# Patient Record
Sex: Male | Born: 1945 | Race: White | Hispanic: No | Marital: Married | Smoking: Former smoker
Health system: Southern US, Community
[De-identification: ages and names within clinical notes are randomized; demographics above are authoritative.]

## PROBLEM LIST (undated history)

## (undated) DIAGNOSIS — I4729 Other ventricular tachycardia: Secondary | ICD-10-CM

## (undated) DIAGNOSIS — M1710 Unilateral primary osteoarthritis, unspecified knee: Secondary | ICD-10-CM

## (undated) DIAGNOSIS — L509 Urticaria, unspecified: Secondary | ICD-10-CM

## (undated) DIAGNOSIS — E785 Hyperlipidemia, unspecified: Secondary | ICD-10-CM

## (undated) DIAGNOSIS — I428 Other cardiomyopathies: Secondary | ICD-10-CM

## (undated) DIAGNOSIS — IMO0002 Reserved for concepts with insufficient information to code with codable children: Secondary | ICD-10-CM

## (undated) DIAGNOSIS — T8859XA Other complications of anesthesia, initial encounter: Secondary | ICD-10-CM

## (undated) DIAGNOSIS — Z860100 Personal history of colon polyps, unspecified: Secondary | ICD-10-CM

## (undated) DIAGNOSIS — I509 Heart failure, unspecified: Secondary | ICD-10-CM

## (undated) DIAGNOSIS — I429 Cardiomyopathy, unspecified: Secondary | ICD-10-CM

## (undated) DIAGNOSIS — Z8249 Family history of ischemic heart disease and other diseases of the circulatory system: Secondary | ICD-10-CM

## (undated) DIAGNOSIS — M1990 Unspecified osteoarthritis, unspecified site: Secondary | ICD-10-CM

## (undated) DIAGNOSIS — L500 Allergic urticaria: Secondary | ICD-10-CM

## (undated) DIAGNOSIS — Z95810 Presence of automatic (implantable) cardiac defibrillator: Secondary | ICD-10-CM

## (undated) DIAGNOSIS — I472 Ventricular tachycardia: Secondary | ICD-10-CM

## (undated) DIAGNOSIS — F419 Anxiety disorder, unspecified: Secondary | ICD-10-CM

## (undated) DIAGNOSIS — J45909 Unspecified asthma, uncomplicated: Secondary | ICD-10-CM

## (undated) DIAGNOSIS — I10 Essential (primary) hypertension: Secondary | ICD-10-CM

## (undated) DIAGNOSIS — K219 Gastro-esophageal reflux disease without esophagitis: Secondary | ICD-10-CM

## (undated) DIAGNOSIS — I493 Ventricular premature depolarization: Secondary | ICD-10-CM

## (undated) DIAGNOSIS — R0902 Hypoxemia: Secondary | ICD-10-CM

## (undated) DIAGNOSIS — T50905A Adverse effect of unspecified drugs, medicaments and biological substances, initial encounter: Secondary | ICD-10-CM

## (undated) DIAGNOSIS — R9389 Abnormal findings on diagnostic imaging of other specified body structures: Secondary | ICD-10-CM

## (undated) DIAGNOSIS — N189 Chronic kidney disease, unspecified: Secondary | ICD-10-CM

## (undated) DIAGNOSIS — T782XXA Anaphylactic shock, unspecified, initial encounter: Secondary | ICD-10-CM

## (undated) DIAGNOSIS — L409 Psoriasis, unspecified: Secondary | ICD-10-CM

## (undated) HISTORY — DX: Anxiety disorder, unspecified: F41.9

## (undated) HISTORY — PX: TONSILLECTOMY: SUR1361

## (undated) HISTORY — PX: OTHER SURGICAL HISTORY: SHX169

## (undated) HISTORY — PX: CATARACT EXTRACTION: SUR2

## (undated) HISTORY — PX: EYE SURGERY: SHX253

## (undated) HISTORY — PX: APPENDECTOMY: SHX54

## (undated) HISTORY — PX: CARDIAC CATHETERIZATION: SHX172

## (undated) HISTORY — DX: Essential (primary) hypertension: I10

## (undated) HISTORY — DX: Other ventricular tachycardia: I47.29

## (undated) HISTORY — DX: Psoriasis, unspecified: L40.9

## (undated) HISTORY — DX: Unilateral primary osteoarthritis, unspecified knee: M17.10

## (undated) HISTORY — DX: Urticaria, unspecified: L50.9

## (undated) HISTORY — DX: Unspecified osteoarthritis, unspecified site: M19.90

## (undated) HISTORY — DX: Heart failure, unspecified: I50.9

## (undated) HISTORY — DX: Presence of automatic (implantable) cardiac defibrillator: Z95.810

## (undated) HISTORY — DX: Reserved for concepts with insufficient information to code with codable children: IMO0002

## (undated) HISTORY — DX: Ventricular tachycardia: I47.2

## (undated) HISTORY — DX: Hyperlipidemia, unspecified: E78.5

## (undated) HISTORY — DX: Unspecified asthma, uncomplicated: J45.909

## (undated) HISTORY — DX: Chronic kidney disease, unspecified: N18.9

## (undated) HISTORY — DX: Abnormal findings on diagnostic imaging of other specified body structures: R93.89

## (undated) HISTORY — DX: Ventricular premature depolarization: I49.3

## (undated) HISTORY — PX: ESOPHAGOGASTRODUODENOSCOPY: SHX1529

## (undated) HISTORY — DX: Hypoxemia: R09.02

---

## 2004-08-18 HISTORY — PX: KNEE ARTHROPLASTY: SHX992

## 2005-05-22 ENCOUNTER — Other Ambulatory Visit: Payer: Self-pay

## 2005-05-23 ENCOUNTER — Ambulatory Visit: Payer: Self-pay | Admitting: Unknown Physician Specialty

## 2005-08-28 ENCOUNTER — Ambulatory Visit: Payer: Self-pay | Admitting: Cardiology

## 2005-09-04 ENCOUNTER — Encounter: Payer: Self-pay | Admitting: Cardiology

## 2005-09-04 ENCOUNTER — Ambulatory Visit: Payer: Self-pay | Admitting: Cardiology

## 2005-09-04 ENCOUNTER — Ambulatory Visit (HOSPITAL_COMMUNITY): Admission: RE | Admit: 2005-09-04 | Discharge: 2005-09-04 | Payer: Self-pay | Admitting: Cardiology

## 2005-09-08 ENCOUNTER — Ambulatory Visit: Payer: Self-pay | Admitting: Cardiology

## 2005-09-09 ENCOUNTER — Ambulatory Visit: Payer: Self-pay | Admitting: Internal Medicine

## 2005-09-17 ENCOUNTER — Ambulatory Visit: Payer: Self-pay | Admitting: Internal Medicine

## 2005-09-17 ENCOUNTER — Ambulatory Visit (HOSPITAL_COMMUNITY): Admission: RE | Admit: 2005-09-17 | Discharge: 2005-09-18 | Payer: Self-pay | Admitting: Internal Medicine

## 2005-09-26 ENCOUNTER — Ambulatory Visit: Payer: Self-pay | Admitting: Internal Medicine

## 2005-09-26 ENCOUNTER — Ambulatory Visit: Payer: Self-pay | Admitting: Cardiology

## 2005-10-03 ENCOUNTER — Ambulatory Visit: Payer: Self-pay | Admitting: Cardiology

## 2005-10-21 ENCOUNTER — Ambulatory Visit: Payer: Self-pay | Admitting: Critical Care Medicine

## 2005-10-31 ENCOUNTER — Ambulatory Visit: Payer: Self-pay | Admitting: Internal Medicine

## 2005-11-18 ENCOUNTER — Ambulatory Visit: Payer: Self-pay | Admitting: Internal Medicine

## 2005-11-26 ENCOUNTER — Ambulatory Visit: Payer: Self-pay | Admitting: Internal Medicine

## 2005-12-16 ENCOUNTER — Ambulatory Visit: Payer: Self-pay | Admitting: Cardiology

## 2005-12-30 ENCOUNTER — Ambulatory Visit: Payer: Self-pay | Admitting: Cardiology

## 2006-01-05 ENCOUNTER — Ambulatory Visit: Payer: Self-pay | Admitting: Cardiology

## 2006-01-16 ENCOUNTER — Ambulatory Visit: Payer: Self-pay | Admitting: Internal Medicine

## 2006-03-05 ENCOUNTER — Ambulatory Visit: Payer: Self-pay | Admitting: Cardiology

## 2006-04-01 ENCOUNTER — Ambulatory Visit: Payer: Self-pay | Admitting: Cardiology

## 2006-04-01 ENCOUNTER — Ambulatory Visit: Payer: Self-pay

## 2006-04-01 ENCOUNTER — Encounter: Payer: Self-pay | Admitting: Cardiology

## 2006-04-08 ENCOUNTER — Ambulatory Visit: Payer: Self-pay | Admitting: Cardiology

## 2006-04-16 ENCOUNTER — Ambulatory Visit: Payer: Self-pay | Admitting: Internal Medicine

## 2006-06-11 ENCOUNTER — Ambulatory Visit: Payer: Self-pay | Admitting: Internal Medicine

## 2006-07-16 ENCOUNTER — Ambulatory Visit: Payer: Self-pay | Admitting: Internal Medicine

## 2006-07-24 ENCOUNTER — Ambulatory Visit: Payer: Self-pay | Admitting: Cardiology

## 2006-10-01 ENCOUNTER — Ambulatory Visit: Payer: Self-pay | Admitting: Cardiology

## 2006-10-01 LAB — CONVERTED CEMR LAB
AST: 20 units/L (ref 0–37)
Albumin: 3.6 g/dL (ref 3.5–5.2)
Bilirubin, Direct: 0.1 mg/dL (ref 0.0–0.3)
CO2: 27 meq/L (ref 19–32)
Chloride: 105 meq/L (ref 96–112)
Cholesterol: 183 mg/dL (ref 0–200)
Creatinine, Ser: 1.4 mg/dL (ref 0.4–1.5)
Direct LDL: 107.4 mg/dL
Glucose, Bld: 100 mg/dL — ABNORMAL HIGH (ref 70–99)
HDL: 43.4 mg/dL (ref >39.0)
Sodium: 139 meq/L (ref 135–145)
Total Bilirubin: 0.9 mg/dL (ref 0.3–1.2)
Total Protein: 6.9 g/dL (ref 6.0–8.3)

## 2006-10-27 ENCOUNTER — Ambulatory Visit: Payer: Self-pay | Admitting: Cardiology

## 2006-12-28 ENCOUNTER — Ambulatory Visit: Payer: Self-pay | Admitting: Cardiology

## 2007-02-17 ENCOUNTER — Ambulatory Visit: Payer: Self-pay | Admitting: Cardiology

## 2007-02-17 LAB — CONVERTED CEMR LAB
ALT: 20 units/L (ref 0–53)
AST: 14 units/L (ref 0–37)
Alkaline Phosphatase: 57 units/L (ref 39–117)
BUN: 22 mg/dL (ref 6–23)
CO2: 28 meq/L (ref 19–32)
Calcium: 8.8 mg/dL (ref 8.4–10.5)
Chloride: 107 meq/L (ref 96–112)
GFR calc Af Amer: 62 mL/min
Total Protein: 7 g/dL (ref 6.0–8.3)
Triglycerides: 271 mg/dL (ref 0–149)

## 2007-06-18 ENCOUNTER — Ambulatory Visit: Payer: Self-pay | Admitting: Cardiology

## 2007-06-22 ENCOUNTER — Ambulatory Visit: Payer: Self-pay

## 2007-07-22 ENCOUNTER — Ambulatory Visit: Payer: Self-pay | Admitting: Cardiology

## 2007-07-22 LAB — CONVERTED CEMR LAB
Bilirubin, Direct: 0.1 mg/dL (ref 0.0–0.3)
Calcium: 9.2 mg/dL (ref 8.4–10.5)
Cholesterol: 199 mg/dL (ref 0–200)
GFR calc Af Amer: 80 mL/min
GFR calc non Af Amer: 66 mL/min
Glucose, Bld: 105 mg/dL — ABNORMAL HIGH (ref 70–99)
HDL: 53.6 mg/dL (ref >39.0)
LDL Cholesterol: 116 mg/dL — ABNORMAL HIGH (ref 0–99)
Sodium: 140 meq/L (ref 135–145)
TSH: 1.5 microintl units/mL (ref 0.35–5.50)
Total CHOL/HDL Ratio: 3.7

## 2007-07-27 ENCOUNTER — Ambulatory Visit: Payer: Self-pay | Admitting: Internal Medicine

## 2007-11-08 ENCOUNTER — Ambulatory Visit: Payer: Self-pay | Admitting: Cardiology

## 2007-11-16 ENCOUNTER — Encounter: Payer: Self-pay | Admitting: Cardiology

## 2007-11-16 ENCOUNTER — Ambulatory Visit: Payer: Self-pay | Admitting: Cardiology

## 2007-11-16 LAB — CONVERTED CEMR LAB
Chloride: 105 meq/L (ref 96–112)
Potassium: 4.4 meq/L (ref 3.5–5.3)

## 2007-12-10 ENCOUNTER — Ambulatory Visit: Payer: Self-pay | Admitting: Cardiology

## 2007-12-20 ENCOUNTER — Ambulatory Visit: Payer: Self-pay | Admitting: Internal Medicine

## 2008-02-03 ENCOUNTER — Ambulatory Visit: Payer: Self-pay | Admitting: Cardiology

## 2008-02-09 ENCOUNTER — Ambulatory Visit: Payer: Self-pay | Admitting: Cardiology

## 2008-02-09 LAB — CONVERTED CEMR LAB: TSH: 1.68 microintl units/mL (ref 0.35–5.50)

## 2008-04-11 ENCOUNTER — Ambulatory Visit: Payer: Self-pay | Admitting: Internal Medicine

## 2008-07-10 ENCOUNTER — Ambulatory Visit: Payer: Self-pay | Admitting: Internal Medicine

## 2008-07-27 ENCOUNTER — Ambulatory Visit: Payer: Self-pay | Admitting: Cardiology

## 2008-07-27 LAB — CONVERTED CEMR LAB
AST: 16 units/L (ref 0–37)
Alkaline Phosphatase: 51 units/L (ref 39–117)
TSH: 1.12 microintl units/mL (ref 0.35–5.50)
Total Bilirubin: 0.8 mg/dL (ref 0.3–1.2)

## 2008-08-03 ENCOUNTER — Ambulatory Visit: Payer: Self-pay | Admitting: Cardiology

## 2008-10-09 ENCOUNTER — Ambulatory Visit: Payer: Self-pay | Admitting: Internal Medicine

## 2008-10-09 ENCOUNTER — Emergency Department (HOSPITAL_COMMUNITY): Admission: EM | Admit: 2008-10-09 | Discharge: 2008-10-09 | Payer: Self-pay | Admitting: Emergency Medicine

## 2008-10-11 ENCOUNTER — Encounter: Payer: Self-pay | Admitting: Internal Medicine

## 2008-10-27 DIAGNOSIS — E782 Mixed hyperlipidemia: Secondary | ICD-10-CM | POA: Insufficient documentation

## 2008-10-27 DIAGNOSIS — I493 Ventricular premature depolarization: Secondary | ICD-10-CM | POA: Insufficient documentation

## 2008-10-27 DIAGNOSIS — M818 Other osteoporosis without current pathological fracture: Secondary | ICD-10-CM | POA: Insufficient documentation

## 2008-10-27 DIAGNOSIS — I00 Rheumatic fever without heart involvement: Secondary | ICD-10-CM | POA: Insufficient documentation

## 2008-10-30 ENCOUNTER — Ambulatory Visit: Payer: Self-pay | Admitting: Cardiology

## 2008-10-30 ENCOUNTER — Encounter: Payer: Self-pay | Admitting: Cardiology

## 2008-11-07 ENCOUNTER — Encounter: Payer: Self-pay | Admitting: Cardiology

## 2008-11-07 ENCOUNTER — Ambulatory Visit: Payer: Self-pay

## 2008-12-18 ENCOUNTER — Ambulatory Visit: Payer: Self-pay | Admitting: Internal Medicine

## 2008-12-20 ENCOUNTER — Encounter: Payer: Self-pay | Admitting: Internal Medicine

## 2009-02-15 ENCOUNTER — Ambulatory Visit: Payer: Self-pay | Admitting: Cardiology

## 2009-02-15 DIAGNOSIS — J984 Other disorders of lung: Secondary | ICD-10-CM | POA: Insufficient documentation

## 2009-02-15 DIAGNOSIS — I10 Essential (primary) hypertension: Secondary | ICD-10-CM | POA: Insufficient documentation

## 2009-02-15 DIAGNOSIS — I428 Other cardiomyopathies: Secondary | ICD-10-CM | POA: Insufficient documentation

## 2009-02-15 DIAGNOSIS — R42 Dizziness and giddiness: Secondary | ICD-10-CM | POA: Insufficient documentation

## 2009-02-15 DIAGNOSIS — R930 Abnormal findings on diagnostic imaging of skull and head, not elsewhere classified: Secondary | ICD-10-CM | POA: Insufficient documentation

## 2009-02-15 DIAGNOSIS — Z95810 Presence of automatic (implantable) cardiac defibrillator: Secondary | ICD-10-CM

## 2009-02-15 HISTORY — DX: Presence of automatic (implantable) cardiac defibrillator: Z95.810

## 2009-02-16 LAB — CONVERTED CEMR LAB
AST: 21 units/L (ref 0–37)
Albumin: 3.7 g/dL (ref 3.5–5.2)
Alkaline Phosphatase: 54 units/L (ref 39–117)
Total Protein: 7 g/dL (ref 6.0–8.3)

## 2009-03-19 ENCOUNTER — Ambulatory Visit: Payer: Self-pay | Admitting: Internal Medicine

## 2009-03-23 ENCOUNTER — Encounter: Payer: Self-pay | Admitting: Internal Medicine

## 2009-04-17 ENCOUNTER — Telehealth: Payer: Self-pay | Admitting: Internal Medicine

## 2009-06-18 ENCOUNTER — Ambulatory Visit: Payer: Self-pay | Admitting: Internal Medicine

## 2009-06-19 ENCOUNTER — Encounter: Payer: Self-pay | Admitting: Internal Medicine

## 2009-06-20 ENCOUNTER — Encounter: Payer: Self-pay | Admitting: Cardiology

## 2009-06-27 ENCOUNTER — Encounter: Payer: Self-pay | Admitting: Internal Medicine

## 2009-07-18 HISTORY — PX: KNEE ARTHROPLASTY: SHX992

## 2009-08-02 ENCOUNTER — Ambulatory Visit: Payer: Self-pay | Admitting: Unknown Physician Specialty

## 2009-08-03 ENCOUNTER — Telehealth (INDEPENDENT_AMBULATORY_CARE_PROVIDER_SITE_OTHER): Payer: Self-pay | Admitting: *Deleted

## 2009-08-16 ENCOUNTER — Inpatient Hospital Stay: Payer: Self-pay | Admitting: Unknown Physician Specialty

## 2009-08-16 HISTORY — PX: JOINT REPLACEMENT: SHX530

## 2009-10-01 ENCOUNTER — Ambulatory Visit: Payer: Self-pay | Admitting: Unknown Physician Specialty

## 2009-10-12 ENCOUNTER — Encounter: Payer: Self-pay | Admitting: Internal Medicine

## 2009-10-24 ENCOUNTER — Encounter: Payer: Self-pay | Admitting: Internal Medicine

## 2009-10-25 ENCOUNTER — Ambulatory Visit: Payer: Self-pay | Admitting: Internal Medicine

## 2009-11-13 ENCOUNTER — Encounter: Payer: Self-pay | Admitting: Internal Medicine

## 2009-12-17 ENCOUNTER — Telehealth: Payer: Self-pay | Admitting: Internal Medicine

## 2009-12-25 ENCOUNTER — Ambulatory Visit: Payer: Self-pay | Admitting: Cardiology

## 2009-12-26 LAB — CONVERTED CEMR LAB
ALT: 17 units/L (ref 0–53)
Albumin: 3.9 g/dL (ref 3.5–5.2)
BUN: 14 mg/dL (ref 6–23)
Chloride: 106 meq/L (ref 96–112)
Creatinine, Ser: 1.2 mg/dL (ref 0.4–1.5)
TSH: 1.87 microintl units/mL (ref 0.35–5.50)
Total Bilirubin: 0.7 mg/dL (ref 0.3–1.2)

## 2010-01-22 ENCOUNTER — Ambulatory Visit: Payer: Self-pay | Admitting: Internal Medicine

## 2010-01-24 ENCOUNTER — Encounter: Payer: Self-pay | Admitting: Internal Medicine

## 2010-02-25 ENCOUNTER — Emergency Department: Payer: Self-pay | Admitting: Emergency Medicine

## 2010-04-25 ENCOUNTER — Ambulatory Visit: Payer: Self-pay | Admitting: Internal Medicine

## 2010-05-06 ENCOUNTER — Encounter: Payer: Self-pay | Admitting: Internal Medicine

## 2010-06-24 ENCOUNTER — Telehealth: Payer: Self-pay | Admitting: Cardiology

## 2010-06-24 ENCOUNTER — Ambulatory Visit: Payer: Self-pay | Admitting: Cardiology

## 2010-06-25 ENCOUNTER — Ambulatory Visit: Payer: Self-pay | Admitting: Cardiology

## 2010-06-26 LAB — CONVERTED CEMR LAB
ALT: 13 units/L (ref 0–53)
Bilirubin, Direct: 0.1 mg/dL (ref 0.0–0.3)
Calcium: 9 mg/dL (ref 8.4–10.5)
Chloride: 105 meq/L (ref 96–112)
Creatinine, Ser: 1.3 mg/dL (ref 0.4–1.5)
GFR calc non Af Amer: 60.92 mL/min (ref >60)
TSH: 1.57 microintl units/mL (ref 0.35–5.50)
Total Bilirubin: 0.7 mg/dL (ref 0.3–1.2)

## 2010-08-01 ENCOUNTER — Ambulatory Visit: Payer: Self-pay | Admitting: Internal Medicine

## 2010-09-02 ENCOUNTER — Encounter (INDEPENDENT_AMBULATORY_CARE_PROVIDER_SITE_OTHER): Payer: Self-pay | Admitting: *Deleted

## 2010-09-17 NOTE — Cardiovascular Report (Signed)
Summary: Office Visit Remote   Office Visit Remote   Imported By: Roderic Ovens 05/07/2010 13:53:26  _____________________________________________________________________  External Attachment:    Type:   Image     Comment:   External Document

## 2010-09-17 NOTE — Assessment & Plan Note (Signed)
Summary: F1Y/AMD  Medications Added MAGNESIUM 200 MG TABS (MAGNESIUM) two times a day      Allergies Added:   Visit Type:  1 yr f/u Primary Provider:  Bethann Punches, M.D.  CC:  no complaints.  History of Present Illness: [name] is a pleasant gentleman, who has a history of nonischemic cardiomyopathy improved by most recent echocardiogram.  His last echo on November 07, 2008, showed an ejection fraction of 50%.  There was mild aortic insufficiency and mitral regurgitation.  There was moderate left atrial enlargement and mild tricuspid regurgitation.  He also has had a previous ICD.  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 I last saw him in about a year ago. Since then he has occasional dyspnea which is not particularly bothersome. It is not related to activities. It is short lived. There was no associated chest pain. He denies any orthopnea, PND, pedal edema, palpitations, syncope or ICD discharge.  He notes that he is still working out with a trainer two times a week.  Current Medications (verified): 1)  Coreg 25 Mg Tabs (Carvedilol) .... 2 Tabs Two Times A Day 2)  Furosemide 20 Mg Tabs (Furosemide) .Marland Kitchen.. 1 Tab Once Daily 3)  Lipitor 40 Mg Tabs (Atorvastatin Calcium) .... 1/2 Tab By Mouth Once Daily 4)  Amiodarone Hcl 200 Mg Tabs (Amiodarone Hcl) .... 200mg  M-Th...100mg   Fr, Sa, Su 5)  Amlodipine Besylate 5 Mg Tabs (Amlodipine Besylate) .Marland Kitchen.. 1 Tab By Mouth Once Daily 6)  Potassium Chloride Crys Cr 20 Meq Cr-Tabs (Potassium Chloride Crys Cr) .... 1/2 Tab By Mouth Once Daily 7)  Magnesium 200 Mg Tabs (Magnesium) .... Two Times A Day  Allergies (verified): 1)  ! Ace Inhibitors 2)  ! Nsaids  Past History:  Past Medical History: Last updated: 12/25/2009 Congestive Heart Failure Nonischemic cardiomyopathy Frequent PVC Hyperlipidemia Hypertension hx of ACE inhibitor  asa allergy hx of rheumatic  fever osteoporosis abnormal chest CT (resoved on chest CT 7/10)  Past Surgical History: Last updated: 10/27/2008 Knee Arthroplasty-Total left knee 2006 Knee Arthroplasty-Total right knee x2 dual chamber ICD 1.31.07 St Jude  Review of Systems  The patient denies chest pain, syncope, dyspnea on exertion, and peripheral edema.    Vital Signs:  Patient profile:   65 year old male Height:      69 inches Weight:      228 pounds BMI:     33.79 Pulse rate:   64 / minute BP sitting:   156 / 94  (left arm) Cuff size:   regular  Vitals Entered By: Bishop Dublin, CMA (January 22, 2010 11:25 AM)  Physical Exam  General:  Well-developed well-nourished in no acute distress.  Skin is warm and dry.  HEENT is normal.  Neck is supple. No thyromegaly.  Chest is clear to auscultation with normal expansion. Well healed ICD incision. Cardiovascular exam is regular rate and rhythm.  Abdominal exam nontender or distended. No masses palpated. Extremities show no edema. status post right knee replacement neuro grossly intact    EKG  Procedure date:  01/22/2010  Findings:      Normal sinus rhythm with rate of: 64.  First degree AV-Block noted.     ICD Specifications Following MD:  Lewayne Bunting, MD     ICD Vendor:  [facility] Jude     ICD Model Number:  [id number]     ICD Serial Number:  [id number] ICD DOI:  09/17/2005     ICD Implanting MD:  Lewayne Bunting, MD  Lead 1:    Location: RA     DOI: 09/17/2005     Model #: 1788T     Serial #: [id number]     Status: active Lead 2:    Location: RV     DOI: 09/17/2005     Model #: 7001     Serial #: [id number]     Status: active  Indications::  NICM   ICD Follow Up Remote Check?  No Battery Voltage:  2.62 V     Charge Time:  11.1 seconds     Underlying rhythm:  SR ICD Dependent:  No       ICD Device Measurements Atrium:  Amplitude: 3.0 mV, Impedance: 420 ohms, Threshold: 0.5 V at 0.5 msec Right Ventricle:  Amplitude: 10.3 mV, Impedance: 455 ohms, Threshold: 1.0 V  at 0.5 msec  Episodes MS Episodes:  0     Percent Mode Switch:  0     Coumadin:  No Shock:  0     ATP:  0     Nonsustained:  0     Atrial Pacing:  2%     Ventricular Pacing:  1%  Brady Parameters Mode DDD     Lower Rate Limit:  50     Upper Rate Limit 95 PAV 300     Sensed AV Delay:  350  Tachy Zones VF:  222     VT:  182     VT1:  160     Next Remote Date:  04/25/2010     Next Cardiology Appt Due:  01/17/2011 Tech Comments:  No parameter changes.  Device function normal.  Merlin transmissions every 3 months.  ROV 1 year with Dr. Ladona Ridgel in [place]. Altha Harm, LPN  January 23, 5620 11:50 AM  MD Comments:  Agree with above.  Impression & Recommendations:  Problem # 1:  IMPLANTATION OF DEFIBRILLATOR, HX OF (ICD-V45.02) His device is working normally. Will recheck in several months.  Problem # 2:  OTHER PRIMARY CARDIOMYOPATHIES (ICD-425.4) His CHF is improved.  Continue meds as below.  A low sodium diet is encouraged. His updated medication list for this problem includes:    Coreg 25 Mg Tabs (Carvedilol) .Marland Kitchen... 2 tabs two times a day    Furosemide 20 Mg Tabs (Furosemide) .Marland Kitchen... 1 tab once daily    Amiodarone Hcl 200 Mg Tabs (Amiodarone hcl) ..... 200mg  m-th...100mg   fr, sa, su    Amlodipine Besylate 5 Mg Tabs (Amlodipine besylate) .Marland Kitchen... 1 tab by mouth once daily  Problem # 3:  HYPERTENSION (ICD-401.9) His blood pressure is a bit elevated today.  A low sodium diet is recommended. His updated medication list for this problem includes:    Coreg 25 Mg Tabs (Carvedilol) .Marland Kitchen... 2 tabs two times a day    Furosemide 20 Mg Tabs (Furosemide) .Marland Kitchen... 1 tab once daily    Amlodipine Besylate 5 Mg Tabs (Amlodipine besylate) .Marland Kitchen... 1 tab by mouth once daily  Patient Instructions: 1)  Your physician recommends that you continue on your current medications as directed. Please refer to the Current Medication list given to you today. 2)  Your physician wants you to follow-up in:   1 year  You will  receive a reminder letter in the mail two months in advance. If you don't receive a letter, please call our office to schedule the follow-up appointment.

## 2010-09-17 NOTE — Assessment & Plan Note (Signed)
Summary: rov/. gd  Medications Added POTASSIUM CHLORIDE CRYS CR 20 MEQ CR-TABS (POTASSIUM CHLORIDE CRYS CR) 1/2 TAB by mouth once daily MAGNESIUM 500 MG TABS (MAGNESIUM) one tablet by mouth once daily      Allergies Added:   History of Present Illness: [name] is a pleasant gentleman, who has a history of nonischemic cardiomyopathy improved by most recent echocardiogram.  His last echo on November 07, 2008, showed an ejection fraction of 50%.  There was mild aortic insufficiency and mitral regurgitation.  There was moderate left atrial enlargement and mild tricuspid regurgitation.  He also has had a previous ICD.  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 I last saw him in July of 2010. Since then he has occasional dyspnea which is not particularly bothersome. It is not related to activities. It is short lived. There was no associated chest pain. He denies any orthopnea, PND, pedal edema, palpitations, syncope or ICD discharge.  Current Medications (verified): 1)  Coreg 25 Mg Tabs (Carvedilol) .... 2 Tabs Two Times A Day 2)  Furosemide 20 Mg Tabs (Furosemide) .Marland Kitchen.. 1 Tab Once Daily 3)  Lipitor 40 Mg Tabs (Atorvastatin Calcium) .... 1/2 Tab By Mouth Once Daily 4)  Amiodarone Hcl 200 Mg Tabs (Amiodarone Hcl) .... 200mg  M-Th...100mg   Fr, Sa, Su 5)  Amlodipine Besylate 5 Mg Tabs (Amlodipine Besylate) .Marland Kitchen.. 1 Tab By Mouth Once Daily 6)  Potassium Chloride Crys Cr 20 Meq Cr-Tabs (Potassium Chloride Crys Cr) .... 1/2 Tab By Mouth Once Daily 7)  Magnesium 500 Mg Tabs (Magnesium) .... One Tablet By Mouth Once Daily  Allergies (verified): 1)  ! Ace Inhibitors 2)  ! Nsaids  Past History:  Past Medical History: Congestive Heart Failure Nonischemic cardiomyopathy Frequent PVC Hyperlipidemia Hypertension hx of ACE inhibitor  asa allergy hx of rheumatic fever osteoporosis abnormal chest CT (resoved on chest CT  7/10)  Past Surgical History: Reviewed history from 10/27/2008 and no changes required. Knee Arthroplasty-Total left knee 2006 Knee Arthroplasty-Total right knee x2 dual chamber ICD 1.31.07 St Jude  Social History: Reviewed history from 10/27/2008 and no changes required. Full Time salesman Married  Tobacco Use - Former. quit 1976 Alcohol Use - yes socially Drug Use - no 2 children  Review of Systems       Left knee arthralgias but no fevers or chills, productive cough, hemoptysis, dysphasia, odynophagia, melena, hematochezia, dysuria, hematuria, rash, seizure activity, orthopnea, PND, pedal edema, claudication. Remaining systems are negative.   Vital Signs:  Patient profile:   65 year old male Weight:      223 pounds Pulse rate:   67 / minute Pulse rhythm:   regular BP sitting:   142 / 84  (left arm) Cuff size:   regular  Vitals Entered By: Deliah Goody, RN (Dec 25, 2009 8:01 AM)  Physical Exam  General:  Well-developed well-nourished in no acute distress.  Skin is warm and dry.  HEENT is normal.  Neck is supple. No thyromegaly.  Chest is clear to auscultation with normal expansion.  Cardiovascular exam is regular rate and rhythm.  Abdominal exam nontender or distended. No masses palpated. Extremities show no edema. status post right knee replacement neuro grossly intact    EKG  Procedure date:  12/25/2009  Findings:      Normal sinus rhythm at a rate of 62. Axis normal. First degree AV block.   ICD Specifications ICD Vendor:  [employer] Jude  ICD Model Number:  V268     ICD Serial Number:  [id number] ICD DOI:  09/17/2005     ICD Implanting MD:  Lewayne Bunting, MD  Lead 1:    Location: RA     DOI: 09/17/2005     Model #: 1788T     Serial #: [id number]     Status: active Lead 2:    Location: RV     DOI: 09/17/2005     Model #: 7001     Serial #: [id number]     Status: active  Indications::  NICM   Brady Parameters Mode DDD     Lower Rate Limit:  50     Upper Rate  Limit 95 PAV 300     Sensed AV Delay:  350  Tachy Zones VF:  222     VT:  182     VT1:  160     Impression & Recommendations:  Problem # 1:  IMPLANTATION OF DEFIBRILLATOR, HX OF (ICD-V45.02) Management per EP.  Problem # 2:  CT, CHEST, ABNORMAL (ICD-793.1) Resolution of abnormality on most recent chest CT. No further workup.  Problem # 3:  OTHER PRIMARY CARDIOMYOPATHIES (ICD-425.4)  Patient has a mild cardiomyopathy. Continue beta blocker. He is intolerant to ACE inhibitors. This is felt to be secondary potentially to ectopy. He is on amiodarone for this. Check TSH, liver functions and chest x-ray. Check bmet given diuretic use. His updated medication list for this problem includes:    Coreg 25 Mg Tabs (Carvedilol) .Marland Kitchen... 2 tabs two times a day    Furosemide 20 Mg Tabs (Furosemide) .Marland Kitchen... 1 tab once daily    Amiodarone Hcl 200 Mg Tabs (Amiodarone hcl) ..... 200mg  m-th...100mg   fr, sa, su    Amlodipine Besylate 5 Mg Tabs (Amlodipine besylate) .Marland Kitchen... 1 tab by mouth once daily  His updated medication list for this problem includes:    Coreg 25 Mg Tabs (Carvedilol) .Marland Kitchen... 2 tabs two times a day    Furosemide 20 Mg Tabs (Furosemide) .Marland Kitchen... 1 tab once daily    Amiodarone Hcl 200 Mg Tabs (Amiodarone hcl) ..... 200mg  m-th...100mg   fr, sa, su    Amlodipine Besylate 5 Mg Tabs (Amlodipine besylate) .Marland Kitchen... 1 tab by mouth once daily  Orders: EKG w/ Interpretation (93000) T-2 View CXR (71020TC) TLB-BMP (Basic Metabolic Panel-BMET) (80048-METABOL) TLB-Hepatic/Liver Function Pnl (80076-HEPATIC) TLB-TSH (Thyroid Stimulating Hormone) (84443-TSH)  Problem # 4:  HYPERTENSION (ICD-401.9)  Blood pressure reasonable on present medications. Will continue. His updated medication list for this problem includes:    Coreg 25 Mg Tabs (Carvedilol) .Marland Kitchen... 2 tabs two times a day    Furosemide 20 Mg Tabs (Furosemide) .Marland Kitchen... 1 tab once daily    Amlodipine Besylate 5 Mg Tabs (Amlodipine besylate) .Marland Kitchen... 1 tab by mouth  once daily  His updated medication list for this problem includes:    Coreg 25 Mg Tabs (Carvedilol) .Marland Kitchen... 2 tabs two times a day    Furosemide 20 Mg Tabs (Furosemide) .Marland Kitchen... 1 tab once daily    Amlodipine Besylate 5 Mg Tabs (Amlodipine besylate) .Marland Kitchen... 1 tab by mouth once daily  Orders: EKG w/ Interpretation (93000) T-2 View CXR (71020TC) TLB-BMP (Basic Metabolic Panel-BMET) (80048-METABOL) TLB-Hepatic/Liver Function Pnl (80076-HEPATIC) TLB-TSH (Thyroid Stimulating Hormone) (84443-TSH)  Problem # 5:  PREMATURE VENTRICULAR CONTRACTIONS (ICD-427.69)  Continue amiodarone and beta blocker. His updated medication list for this problem includes:    Coreg 25 Mg Tabs (Carvedilol) .Marland Kitchen... 2 tabs two times a day  Amiodarone Hcl 200 Mg Tabs (Amiodarone hcl) ..... 200mg  m-th...100mg   fr, sa, su    Amlodipine Besylate 5 Mg Tabs (Amlodipine besylate) .Marland Kitchen... 1 tab by mouth once daily  His updated medication list for this problem includes:    Coreg 25 Mg Tabs (Carvedilol) .Marland Kitchen... 2 tabs two times a day    Amiodarone Hcl 200 Mg Tabs (Amiodarone hcl) ..... 200mg  m-th...100mg   fr, sa, su    Amlodipine Besylate 5 Mg Tabs (Amlodipine besylate) .Marland Kitchen... 1 tab by mouth once daily  Problem # 6:  HYPERLIPIDEMIA-MIXED (ICD-272.4)  Continue statin. Lipids monitored by primary care. His updated medication list for this problem includes:    Lipitor 40 Mg Tabs (Atorvastatin calcium) .Marland Kitchen... 1/2 tab by mouth once daily  His updated medication list for this problem includes:    Lipitor 40 Mg Tabs (Atorvastatin calcium) .Marland Kitchen... 1/2 tab by mouth once daily  Patient Instructions: 1)  Your physician recommends that you schedule a follow-up appointment in: 6 months with   [name] 2)  Your physician recommends that you have lab work today: BMET, TSH, Liver function  3)  Your physician recommends that you continue on your current medications as directed. Please refer to the Current Medication list given to you today. 4)   A chest x-ray takes a picture of the organs and structures inside the chest, including the heart, lungs, and blood vessels. This test can show several things, including, whether the heart is enlarged; whether fluid is building up in the lungs; and whether pacemaker / defibrillator leads are still in place. Prescriptions: POTASSIUM CHLORIDE CRYS CR 20 MEQ CR-TABS (POTASSIUM CHLORIDE CRYS CR) 1/2 TAB by mouth once daily  #90 x 3   Entered by:   Deliah Goody, RN   Authorized by:   Ferman Hamming, MD, [facility]   Signed by:   Deliah Goody, RN on 12/25/2009   Method used:   Electronically to        [facility]* (mail-order)             ,          Ph: [phone]       Fax: [phone]   RxID:   [id number] AMLODIPINE BESYLATE 5 MG TABS (AMLODIPINE BESYLATE) 1 TAB by mouth once daily  #90 x 3   Entered by:   Deliah Goody, RN   Authorized by:   Ferman Hamming, MD, [facility]   Signed by:   Deliah Goody, RN on 12/25/2009   Method used:   Electronically to        [facility]* (mail-order)             ,          Ph: [phone]       Fax: [phone]   RxID:   [id number] AMIODARONE HCL 200 MG TABS (AMIODARONE HCL) 200mg  M-TH...100mg   FR, SA, SU  #90 x 3   Entered by:   Deliah Goody, RN   Authorized by:   Ferman Hamming, MD, [facility]   Signed by:   Deliah Goody, RN on 12/25/2009   Method used:   Electronically to        [facility] [employer]* (mail-order)             ,          Ph: [phone]       Fax: [phone]   RxID:   [id number] LIPITOR 40 MG TABS (ATORVASTATIN CALCIUM) 1/2 TAB by mouth  once daily  #90 x 3   Entered by:   Deliah Goody, RN   Authorized by:   Ferman Hamming, MD, [facility]   Signed by:   Deliah Goody, RN on 12/25/2009   Method used:   Electronically to        [facility] [employer]* (mail-order)             ,          Ph: [phone]       Fax: [phone]   RxID:   [id number] FUROSEMIDE 20 MG TABS (FUROSEMIDE) 1 tab once daily   #90 x 3   Entered by:   Deliah Goody, RN   Authorized by:   Ferman Hamming, MD, [facility]   Signed by:   Deliah Goody, RN on 12/25/2009   Method used:   Electronically to        [facility]* (mail-order)             ,          Ph: [phone]       Fax: [phone]   RxID:   [id number] COREG 25 MG TABS (CARVEDILOL) 2 tabs two times a day  #360 x 3   Entered by:   Deliah Goody, RN   Authorized by:   Ferman Hamming, MD, [facility]   Signed by:   Deliah Goody, RN on 12/25/2009   Method used:   Electronically to        [facility] [employer]* (mail-order)             ,          Ph: [phone]       Fax: [phone]   RxID:   [id number]

## 2010-09-17 NOTE — Letter (Signed)
Summary: Remote Device Check  [employer], Main Office  [address]   [place], [place] [address]   Phone: [phone]  Fax: [phone]     May 06, 2010 MRN: [id number]   [name] [address] [facility] [address] [place], [place]  [address]   Dear [name],   Your remote transmission was recieved and reviewed by your physician.  All diagnostics were within normal limits for you.  __X___Your next transmission is scheduled for: 08-01-2010.  Please transmit at any time this day.  If you have a wireless device your transmission will be sent automatically.   Sincerely,  Vella Kohler

## 2010-09-17 NOTE — Assessment & Plan Note (Signed)
Summary: f6m/dfg  Medications Added MAGNESIUM 500 MG TABS (MAGNESIUM) 1 tab by mouth once daily XANAX 0.25 MG TABS (ALPRAZOLAM) as needed        Primary Provider:  Bethann Punches, M.D.  CC:  check up.  History of Present Illness: [name] is a pleasant gentleman, who has a history of nonischemic cardiomyopathy improved by most recent echocardiogram.  His last echo on November 07, 2008, showed an ejection fraction of 50%.  There was mild aortic insufficiency and mitral regurgitation.  There was moderate left atrial enlargement and mild tricuspid regurgitation.  He also has had a previous ICD.  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 I last saw him in May of 2011. Since then he was seen in the [facility] emergency room some months ago for dyspnea. Apparently the evaluation was negative and he has been placed on an antianxiety medication with improvement. I do not have his records available. He has dyspnea with more extreme activities but not with routine activities. There is no orthopnea, PND, pedal edema, palpitations, syncope or chest pain.  Current Medications (verified): 1)  Coreg 25 Mg Tabs (Carvedilol) .... 2 Tabs Two Times A Day 2)  Furosemide 20 Mg Tabs (Furosemide) .Marland Kitchen.. 1 Tab Once Daily 3)  Lipitor 40 Mg Tabs (Atorvastatin Calcium) .... 1/2 Tab By Mouth Once Daily 4)  Amiodarone Hcl 200 Mg Tabs (Amiodarone Hcl) .... 200mg  M-Th...100mg   Fr, Sa, Su 5)  Amlodipine Besylate 5 Mg Tabs (Amlodipine Besylate) .Marland Kitchen.. 1 Tab By Mouth Once Daily 6)  Potassium Chloride Crys Cr 20 Meq Cr-Tabs (Potassium Chloride Crys Cr) .... 1/2 Tab By Mouth Once Daily 7)  Magnesium 500 Mg Tabs (Magnesium) .Marland Kitchen.. 1 Tab By Mouth Once Daily 8)  Xanax 0.25 Mg Tabs (Alprazolam) .... As Needed  Allergies: 1)  ! Ace Inhibitors 2)  ! Nsaids  Past History:  Past Medical History: Reviewed history from 12/25/2009 and no changes  required. Congestive Heart Failure Nonischemic cardiomyopathy Frequent PVC Hyperlipidemia Hypertension hx of ACE inhibitor  asa allergy hx of rheumatic fever osteoporosis abnormal chest CT (resoved on chest CT 7/10)  Past Surgical History: Reviewed history from 10/27/2008 and no changes required. Knee Arthroplasty-Total left knee 2006 Knee Arthroplasty-Total right knee x2 dual chamber ICD 1.31.07 [employer]  Social History: Reviewed history from 10/27/2008 and no changes required. Full Time salesman Married  Tobacco Use - Former. quit 1976 Alcohol Use - yes socially Drug Use - no 2 children  Review of Systems       Some arthralgias and nonproductive cough but no fevers or chills, productive cough, hemoptysis, dysphasia, odynophagia, melena, hematochezia, dysuria, hematuria, rash, seizure activity, orthopnea, PND, pedal edema, claudication. Remaining systems are negative.   Vital Signs:  Patient profile:   65 year old male Height:      69 inches Weight:      230 pounds BMI:     34.09 Pulse rate:   65 / minute Resp:     14 per minute BP sitting:   140 / 84  (left arm)  Vitals Entered By: Kem Parkinson (June 24, 2010 8:47 AM)  Physical Exam  General:  Well-developed well-nourished in no acute distress.  Skin is warm and dry.  HEENT is normal.  Neck is supple. No thyromegaly.  Chest is clear to auscultation with normal expansion. ICD in left upper chest Cardiovascular exam is regular rate and rhythm.  Abdominal exam  nontender or distended. No masses palpated. Extremities show no edema. neuro grossly intact    EKG  Procedure date:  06/24/2010  Findings:      Sinus rhythm at a rate of 65. First degree AV block. Nonspecific ST changes.   ICD Specifications Following MD:  Lewayne Bunting, MD     ICD Vendor:  [employer] Jude     ICD Model Number:  [id number]     ICD Serial Number:  [id number] ICD DOI:  09/17/2005     ICD Implanting MD:  Lewayne Bunting, MD  Lead 1:     Location: RA     DOI: 09/17/2005     Model #: [id number]     Serial #: [id number]     Status: active Lead 2:    Location: RV     DOI: 09/17/2005     Model #: 7001     Serial #: [id number]     Status: active  Indications::  NICM   ICD Follow Up ICD Dependent:  No      Episodes Coumadin:  No  Brady Parameters Mode DDD     Lower Rate Limit:  50     Upper Rate Limit 95 PAV 300     Sensed AV Delay:  350  Tachy Zones VF:  222     VT:  182     VT1:  160     Impression & Recommendations:  Problem # 1:  IMPLANTATION OF DEFIBRILLATOR, HX OF (ICD-V45.02) Management per electrophysiology.  Problem # 2:  OTHER PRIMARY CARDIOMYOPATHIES (ICD-425.4) LV function normal to mildly reduced on most recent echo. Continue beta blocker. History of intolerance to ACE inhibitor. Previous cardiomyopathy felt possibly secondary to frequent PVCs. Continue amiodarone. Check TSH, liver functions, chest x-ray and potassium/renal function. His updated medication list for this problem includes:    Coreg 25 Mg Tabs (Carvedilol) .Marland Kitchen... 2 tabs two times a day    Furosemide 20 Mg Tabs (Furosemide) .Marland Kitchen... 1 tab once daily    Amiodarone Hcl 200 Mg Tabs (Amiodarone hcl) ..... 200mg  m-th...100mg   fr, sa, su    Amlodipine Besylate 5 Mg Tabs (Amlodipine besylate) .Marland Kitchen... 1 tab by mouth once daily  Orders: TLB-TSH (Thyroid Stimulating Hormone) (84443-TSH) T-2 View CXR (71020TC)  His updated medication list for this problem includes:    Coreg 25 Mg Tabs (Carvedilol) .Marland Kitchen... 2 tabs two times a day    Furosemide 20 Mg Tabs (Furosemide) .Marland Kitchen... 1 tab once daily    Amiodarone Hcl 200 Mg Tabs (Amiodarone hcl) ..... 200mg  m-th...100mg   fr, sa, su    Amlodipine Besylate 5 Mg Tabs (Amlodipine besylate) .Marland Kitchen... 1 tab by mouth once daily  Problem # 3:  HYPERTENSION (ICD-401.9) Blood pressure control. Continue present medications. Check potassium and renal function. His updated medication list for this problem includes:    Coreg 25 Mg Tabs  (Carvedilol) .Marland Kitchen... 2 tabs two times a day    Furosemide 20 Mg Tabs (Furosemide) .Marland Kitchen... 1 tab once daily    Amlodipine Besylate 5 Mg Tabs (Amlodipine besylate) .Marland Kitchen... 1 tab by mouth once daily  Orders: TLB-BMP (Basic Metabolic Panel-BMET) (80048-METABOL)  Problem # 4:  PREMATURE VENTRICULAR CONTRACTIONS (ICD-427.69) Management as above. His updated medication list for this problem includes:    Coreg 25 Mg Tabs (Carvedilol) .Marland Kitchen... 2 tabs two times a day    Amiodarone Hcl 200 Mg Tabs (Amiodarone hcl) ..... 200mg  m-th...100mg   fr, sa, su    Amlodipine Besylate 5 Mg Tabs (Amlodipine besylate) .Marland KitchenMarland KitchenMarland KitchenMarland Kitchen  1 tab by mouth once daily  Problem # 5:  HYPERLIPIDEMIA-MIXED (ICD-272.4) Continue statin. Lipids and liver monitored by primary care. His updated medication list for this problem includes:    Lipitor 40 Mg Tabs (Atorvastatin calcium) .Marland Kitchen... 1/2 tab by mouth once daily  Orders: TLB-Hepatic/Liver Function Pnl (80076-HEPATIC)  Patient Instructions: 1)  Your physician recommends that you schedule a follow-up appointment in: 6 MONTHS

## 2010-09-17 NOTE — Cardiovascular Report (Signed)
Summary: Office Visit Remote   Office Visit Remote   Imported By: Roderic Ovens 11/15/2009 11:39:52  _____________________________________________________________________  External Attachment:    Type:   Image     Comment:   External Document

## 2010-09-17 NOTE — Progress Notes (Signed)
Summary: need refills   Phone Note Refill Request Message from:  Patient on June 24, 2010 9:15 AM  Refills Requested: Medication #1:  COREG 25 MG TABS 2 tabs two times a day  Medication #2:  FUROSEMIDE 20 MG TABS 1 tab once daily  Medication #3:  LIPITOR 40 MG TABS 1/2 TAB by mouth once daily  Medication #4:  AMIODARONE HCL 200 MG TABS 200mg  M-TH...100mg   FR Potassium, Amlodipine  send all  to [facility] .  Initial call taken by: Judie Grieve,  June 24, 2010 9:16 AM    Prescriptions: AMIODARONE HCL 200 MG TABS (AMIODARONE HCL) 200mg  M-TH...100mg   FR, SA, SU  #90 x 3   Entered by:   Kem Parkinson   Authorized by:   Ferman Hamming, MD, [facility]   Signed by:   Kem Parkinson on 06/24/2010   Method used:   Faxed to ...       [facility] (mail-order)             , [place]         Ph: [phone]       Fax: [phone]   RxID:   [id number] LIPITOR 40 MG TABS (ATORVASTATIN CALCIUM) 1/2 TAB by mouth once daily  #90 x 3   Entered by:   Kem Parkinson   Authorized by:   Ferman Hamming, MD, [facility]   Signed by:   Kem Parkinson on 06/24/2010   Method used:   Faxed to ...       [facility] (mail-order)             , [place]         Ph: [phone]       Fax: [phone]   RxID:   [id number] FUROSEMIDE 20 MG TABS (FUROSEMIDE) 1 tab once daily  #90 x 3   Entered by:   Kem Parkinson   Authorized by:   Ferman Hamming, MD, [facility]   Signed by:   Kem Parkinson on 06/24/2010   Method used:   Faxed to ...       [facility] (mail-order)             , [place]         Ph: [phone]       Fax: [phone]   RxID:   [id number] COREG 25 MG TABS (CARVEDILOL) 2 tabs two times a day  #360 x 3   Entered by:   Kem Parkinson   Authorized by:   Ferman Hamming, MD, [facility]   Signed by:   Kem Parkinson on 06/24/2010   Method used:   Faxed to ...       [facility] (mail-order)             , [place]         Ph: [phone]       Fax: [phone]   RxID:    [id number]

## 2010-09-17 NOTE — Progress Notes (Signed)
Summary[facility]  Medications Added COREG 25 MG TABS (CARVEDILOL) 2 tabs two times a day * FUROSEMIDE 40 MG TABS (FUROSEMIDE) 1/2  tab by mouth once daily FUROSEMIDE 20 MG TABS (FUROSEMIDE) 1 tab once daily LIPITOR 20 MG TABS (ATORVASTATIN CALCIUM) 1 tab by mouth once daily LIPITOR 40 MG TABS (ATORVASTATIN CALCIUM) 1/2 TAB by mouth once daily AMIODARONE HCL 200 MG TABS (AMIODARONE HCL) 200MG  M-F 100MG  SAT-SUN AMIODARONE HCL 200 MG TABS (AMIODARONE HCL) 200mg  M-TH...100mg   FR, SA, SU AMLODIPINE BESYLATE 5 MG TABS (AMLODIPINE BESYLATE) 1 TAB by mouth once daily KLOR-CON M10 10 MEQ CR-TABS (POTASSIUM CHLORIDE CRYS CR) 1 tab by mouth once daily POTASSIUM CHLORIDE CRYS CR 20 MEQ CR-TABS (POTASSIUM CHLORIDE CRYS CR) 1 TAB by mouth once daily       Phone Note Refill Request   Refills Requested: Medication #1:  AMIODARONE HCL 200 MG TABS 200mg  M-TH...100mg   FR   [facility] [place] [phone]   Method Requested: Telephone to Pharmacy Initial call taken by: Migdalia Dk,  Dec 17, 2009 2:40 PM    Prescriptions: AMIODARONE HCL 200 MG TABS (AMIODARONE HCL) 200mg  M-TH...100mg   FR, SA, SU  #90 x 3   Entered by:   Laurance Flatten CMA   Authorized by:   Laren Boom, MD, [facility]   Signed by:   Laurance Flatten CMA on 12/19/2009   Method used:   [facility]        [facility]  [employer] [phone]* (retail)       [address] [place], [place]  [address]       Ph: [phone] or [phone]       Fax: [phone]   RxID:   [id number]

## 2010-09-17 NOTE — Letter (Signed)
Summary: Device-Delinquent Phone Journalist, newspaper, Main Office  [address]   [place], [place] [address]   Phone: [phone]  Fax: [phone]     October 12, 2009 MRN: [id number]   DAEMION MCNIEL [address] [facility] [address] [place], [place]  [address]   Dear Mr. Considine,  According to our records, you were scheduled for a device phone transmission on   September 24, 2009.     We did not receive any results from this check.  If you transmitted on your scheduled day, please call us to help troubleshoot your system.  If you forgot to send your transmission, please send one upon receipt of this letter.  Thank you,   Architectural [facility]

## 2010-09-17 NOTE — Letter (Signed)
Summary: Remote Device Check  [employer], Main Office  [address]   [place], [place] [address]   Phone: [phone]  Fax: [phone]     November 13, 2009 MRN: [id number]   GIBSON LAD [address] [facility] [address] [place], [place]  [address]   Dear Mr. Tudisco,   Your remote transmission was recieved and reviewed by your physician.  All diagnostics were within normal limits for you.   ___X___Your next office visit is scheduled for:     MAY 2011 WITH DR Ladona Ridgel. Please call our office to schedule an appointment.    Sincerely,  Proofreader

## 2010-09-19 NOTE — Letter (Signed)
Summary: Remote Device Check  [employer], Main Office  [address]   [place], [place] [address]   Phone: [phone]  Fax: [phone]     September 02, 2010 MRN: [id number]   [name] [address] [facility] [address] [place], [place]  [address]   Dear [name],   Your remote transmission was recieved and reviewed by your physician.  All diagnostics were within normal limits for you.  __X___Your next transmission is scheduled for:   10-31-2010.  Please transmit at any time this day.  If you have a wireless device your transmission will be sent automatically.   Sincerely,  Vella Kohler

## 2010-09-19 NOTE — Cardiovascular Report (Signed)
Summary: Office Visit Remote   Office Visit Remote   Imported By: Roderic Ovens 09/06/2010 10:32:30  _____________________________________________________________________  External Attachment:    Type:   Image     Comment:   External Document

## 2010-10-31 ENCOUNTER — Encounter (INDEPENDENT_AMBULATORY_CARE_PROVIDER_SITE_OTHER): Payer: Self-pay

## 2010-10-31 ENCOUNTER — Encounter: Payer: Self-pay | Admitting: Internal Medicine

## 2010-10-31 DIAGNOSIS — I428 Other cardiomyopathies: Secondary | ICD-10-CM

## 2010-11-17 ENCOUNTER — Encounter: Payer: Self-pay | Admitting: *Deleted

## 2010-12-03 LAB — URINALYSIS, ROUTINE W REFLEX MICROSCOPIC
Bilirubin Urine: NEGATIVE
Nitrite: NEGATIVE
Protein, ur: 30 mg/dL — AB
Specific Gravity, Urine: 1.022 (ref 1.005–1.030)
Urobilinogen, UA: 1 mg/dL (ref 0.0–1.0)

## 2010-12-03 LAB — URINE MICROSCOPIC-ADD ON

## 2010-12-03 LAB — BASIC METABOLIC PANEL
BUN: 16 mg/dL (ref 6–23)
CO2: 25 mEq/L (ref 19–32)
Calcium: 8.9 mg/dL (ref 8.4–10.5)
Glucose, Bld: 153 mg/dL — ABNORMAL HIGH (ref 70–99)
Potassium: 3.7 mEq/L (ref 3.5–5.1)
Sodium: 138 mEq/L (ref 135–145)

## 2010-12-03 LAB — CBC
HCT: 38.3 % — ABNORMAL LOW (ref 39.0–52.0)
Hemoglobin: 13.6 g/dL (ref 13.0–17.0)
MCHC: 35.5 g/dL (ref 30.0–36.0)
Platelets: 235 10*3/uL (ref 150–400)
RDW: 13.4 % (ref 11.5–15.5)

## 2010-12-03 LAB — DIFFERENTIAL
Eosinophils Relative: 1 % (ref 0–5)
Lymphocytes Relative: 14 % (ref 12–46)
Lymphs Abs: 1.2 10*3/uL (ref 0.7–4.0)
Monocytes Absolute: 0.4 10*3/uL (ref 0.1–1.0)

## 2010-12-03 LAB — POCT CARDIAC MARKERS
CKMB, poc: <1 ng/mL — ABNORMAL LOW (ref 1.0–8.0)
Myoglobin, poc: 83.2 ng/mL (ref 12–200)
Troponin i, poc: <0.05 ng/mL (ref 0.00–0.09)
Troponin i, poc: <0.05 ng/mL (ref 0.00–0.09)

## 2010-12-03 LAB — BRAIN NATRIURETIC PEPTIDE: Pro B Natriuretic peptide (BNP): 51 pg/mL (ref 0.0–100.0)

## 2010-12-28 ENCOUNTER — Encounter: Payer: Self-pay | Admitting: Cardiology

## 2010-12-31 ENCOUNTER — Ambulatory Visit (INDEPENDENT_AMBULATORY_CARE_PROVIDER_SITE_OTHER): Payer: BC Managed Care – PPO | Admitting: Cardiology

## 2010-12-31 ENCOUNTER — Encounter: Payer: Self-pay | Admitting: Cardiology

## 2010-12-31 DIAGNOSIS — Z95810 Presence of automatic (implantable) cardiac defibrillator: Secondary | ICD-10-CM

## 2010-12-31 DIAGNOSIS — I10 Essential (primary) hypertension: Secondary | ICD-10-CM

## 2010-12-31 DIAGNOSIS — I4949 Other premature depolarization: Secondary | ICD-10-CM

## 2010-12-31 DIAGNOSIS — I428 Other cardiomyopathies: Secondary | ICD-10-CM

## 2010-12-31 DIAGNOSIS — E785 Hyperlipidemia, unspecified: Secondary | ICD-10-CM

## 2010-12-31 LAB — TSH: TSH: 1.79 u[IU]/mL (ref 0.35–5.50)

## 2010-12-31 LAB — BASIC METABOLIC PANEL
GFR: 51.37 mL/min — ABNORMAL LOW (ref >60.00)
Glucose, Bld: 96 mg/dL (ref 70–99)
Potassium: 4.1 mEq/L (ref 3.5–5.1)
Sodium: 141 mEq/L (ref 135–145)

## 2010-12-31 LAB — HEPATIC FUNCTION PANEL
Alkaline Phosphatase: 62 U/L (ref 39–117)
Bilirubin, Direct: 0 mg/dL (ref 0.0–0.3)
Total Bilirubin: 0.5 mg/dL (ref 0.3–1.2)
Total Protein: 7.3 g/dL (ref 6.0–8.3)

## 2010-12-31 MED ORDER — POTASSIUM CHLORIDE CRYS ER 20 MEQ PO TBCR: 20.0000 meq | EXTENDED_RELEASE_TABLET | ORAL | Status: DC

## 2010-12-31 MED ORDER — AMLODIPINE BESYLATE 5 MG PO TABS: 5.0000 mg | ORAL_TABLET | Freq: Every day | ORAL | Status: DC

## 2010-12-31 NOTE — Assessment & Plan Note (Signed)
[name]                         ELECTROPHYSIOLOGY OFFICE NOTE   PAU, BANH                      MRN:          [id number]  DATE:07/27/2007                            DOB:          [birth year]    [name] returns today on the request of [name]. He is a very  pleasant, middle-aged man with a nonischemic cardiomyopathy,  nonsustained VT, history of symptomatic PVCs and a strong family history  of sudden cardiac death. He is status post ICD insertion now several  years ago. This patient over the last several weeks has had problems  with palpitations and dizzy spells but no frank syncope. Under the  direction of [name], he wore a cardiac monitor which demonstrated  no arrhythmias. He is referred today for additional evaluation. He has  had no frank syncope. He denies chest pain or shortness of breath.   MEDICATIONS:  1. Coreg 50 twice a day.  2. Atacand 32 daily.  3. Amiodarone 100 mg Monday through Thursday, 200 mg Friday, Saturday      and Sunday.  4. Lipitor 40 mg 1/2-tablet daily.  5. Norvasc 10 mg daily.   PHYSICAL EXAMINATION:  GENERAL:  He is a pleasant, well-appearing man in  no distress.  VITAL SIGNS:  Blood pressure was 148/70, the pulse was 60 and regular.  The respirations were 18.  NECK:  Revealed no jugular venous distention.  LUNGS:  Clear bilaterally to auscultation. No wheezes, rales or rhonchi  were present. There was no increased work of breathing.  CARDIOVASCULAR:  Regular rate and rhythm with normal S1 and S2.  EXTREMITIES:  Demonstrated no cyanosis, clubbing or edema.   Interrogation of his defibrillator demonstrates no ventricular  arrhythmias and no documented atrial arrhythmias however, the patient  was in the VVI mode.   IMPRESSION:  1. Palpitations of unclear etiology with negative cardiac monitor.  2. History of nonsustained ventricular tachycardia.  3. History of nonischemic  cardiomyopathy.  4. Status post ICD insertion.   DISCUSSION:  Overall [name] appears stable to me. I reprogrammed  his defibrillator today so that we can pick up any atrial electrograms  or nonsustained atrial arrhythmias by cardiac monitoring. I have  recommended that he be allowed to return to work.     Doylene Canning. Ladona Ridgel, MD  Electronically Signed    GWT/MedQ  DD: 07/27/2007  DT: 07/28/2007  Job #: [id number]   cc:   Bethann Punches

## 2010-12-31 NOTE — Assessment & Plan Note (Signed)
[facility] OFFICE NOTE   [name], [name]                      MRN:          [id number]  DATE:12/10/2007                            DOB:          05-Nov-1945    HISTORY:  Mr. [name] is a pleasant gentleman who has a history of  nonischemic cardiomyopathy improved by most recent echocardiogram.  He  also has had previous ICD.  When I last saw him on November 08, 2007, he  was complaining of pedal edema and orthopnea.  We added Lasix 40 mg p.o.  daily as well as potassium 20 mEq p.o. daily.  We did repeat a BMET 1  week later and his renal function showed a BUN and creatinine 20 and  1.28.  His BNP was normal at 55.  Since then, he feels better with his  breathing.  There is no orthopnea present nor is there PND, but he  continues to have pedal edema.  There is no chest pain, palpitations or  syncope.  He states he feels somewhat dehydrated on the higher dose of  Lasix.   MEDICATIONS:  1. Magnesium oxide.  2. Meloxicam.  3. Coreg 50 mg p.o. b.i.d.  4. Atacand 32 mg p.o. daily.  5. Norvasc 2 mg p.o. daily.  6. Amiodarone 200 mg daily.  7. Lasix 40 mg p.o. daily.  8. Potassium 20 mEq p.o. daily.  9. Lipitor 20 mg p.o. daily.   PHYSICAL EXAMINATION:  VITAL SIGNS:  Blood pressure 131/84, pulse is 66,  he weighs 241 pounds.  HEENT:  Normal.  NECK:  Supple.  CHEST:  Clear.  CARDIOVASCULAR:  Regular rate and rhythm.  ABDOMEN:  Shows no tenderness.  EXTREMITIES:  Show trace to 1+ edema.   DIAGNOSES:  1. Pedal edema - this appears to be more likely related to the Norvasc      as his BNP previously was normal and there was no change with      Lasix.  He did not have pedal edema on 5 mg a day and we will      decrease his Norvasc to 5.  2. History of nonischemic cardiomyopathy - he will continue on his      Coreg and ARB.  I have asked him to decrease his Lasix to 20 mg      p.o. daily and his potassium to 10  mEq p.o. daily.  3. History of frequent premature ventricular contractions and      nonsustained ventricular tachycardia - he will continue on the      amiodarone.  When I see him back in 2 months, we will plan to      repeat liver functions, TSH and chest x-ray.  4. Hypertension - we will track his blood pressure on the lower dose      of Norvasc and Lasix.  We may need to add additional medications in      the future.  5. Hyperlipidemia - he will continue on his statin.  6. History of ACE inhibitor and aspirin allergy.  7. History of rheumatic fever.  8. Osteoporosis.  9. Amiodarone use.  10.Status post implantable cardioverter-defibrillator.   FOLLOW UP:  We will see him back in 2 months.     Madolyn Frieze Jens Som, MD, [facility]  Electronically Signed    BSC/MedQ  DD: 12/10/2007  DT: 12/10/2007  Job #: [id number]   cc:   Bethann Punches

## 2010-12-31 NOTE — Assessment & Plan Note (Signed)
[facility]                         ELECTROPHYSIOLOGY OFFICE NOTE   [name], [name]                      MRN:          [id number]  DATE:04/11/2008                            DOB:          November 30, 1945    [name] returns today for followup.  He is a very pleasant middle-  aged male with a nonischemic cardiomyopathy, congestive heart failure,  significant PVCs now suppressed with amiodarone.  He returns today for  his followup.  His heart failure is much improved.  He is exercising  again.  He denies chest pain or shortness of breath.   On physical examination, he is a pleasant well-appearing middle-aged man  in no distress.  Blood pressure was 150/86, the pulse 60 and regular,  the respirations were 18, and the weight was 245 pounds.  Neck revealed  no jugular venous distention.  Lungs clear bilaterally to auscultation.  No wheezes, rales, or rhonchi are present.  Cardiovascular exam revealed  a regular rate and rhythm.  Normal S1 and S2.  Abdominal exam is soft  and nontender.  Extremities demonstrate no cyanosis, clubbing, or edema.  Pulses were 2+ and symmetric.   Interrogation of his defibrillator demonstrates a [employer], retail II +DR.  The P-waves greater than 3, R-waves were 11, the impedance 435 in the A,  475 in the RV, the threshold was 0.5 at 0.5 in the A, 0.75 at 0.5 in the  RV.  There are no intercurrent IC therapies.  There are no mode  switches.  PVCs only about 4% of the time.   IMPRESSION:  1. Nonischemic cardiomyopathy.  2. Congestive heart failure.  3. Frequent premature ventricular contractions.   DISCUSSION:  [name] is stable.  His defibrillator is working  normally.  We will plan to see him back in the office in several months  and his PVCs have markedly reduced in frequency.     Doylene Canning. Ladona Ridgel, MD  Electronically Signed    GWT/MedQ  DD: 04/11/2008  DT: 04/12/2008  Job #: [id number]   cc:   Bethann Punches

## 2010-12-31 NOTE — Assessment & Plan Note (Signed)
[facility]                            CARDIOLOGY OFFICE NOTE   Alan, [name]                      MRN:          [id number]  DATE:10/30/2008                            DOB:          12-Oct-1945    [name] is a pleasant gentleman, who has a history of nonischemic  cardiomyopathy improved by most recent echocardiogram.  His last echo on  June 22, 2007, showed an ejection fraction of 50-55%.  There was  trivial aortic insufficiency and mitral regurgitation.  There was mild  left atrial enlargement and mild tricuspid regurgitation.  He also has  had a previous ICD.  Note, his cardiomyopathy is felt possibly secondary  to ventricular ectopy in the past and he is on amiodarone for that.  Since I last saw him, he apparently on October 09, 2008, had an episode  while driving sudden onset of dizziness as well as nausea and shortness  of breath.  There is no associated chest pain, palpitations, and his ICD  did not fire.  This lasted for approximately 1 hour.  He was seen at  [facility].  A chest x-ray was performed that showed no  acute cardiopulmonary process.  He also had cardiac markers that were  negative x2.  A CBC showed a white blood cell count of 8.5 with a  hemoglobin 13.6, hematocrit 38.3.  His platelet count was 235.  His  potassium was 3.7 and his BUN and creatinine were 16 and 1.46  respectively.  His BNP was normal at 51.  Urinalysis was unremarkable.  He has had no further episodes since then.  He does state that he is  having occasional problems clearing his throat.  He denies any dyspnea  on exertion, orthopnea, PND, pedal edema, syncope, or chest pain.  His  ICD has not fired.   PRESENT MEDICATIONS:  1. Coreg 50 mg p.o. b.i.d.  2. Lasix 20 mg p.o. daily.  3. Lipitor 20 mg p.o. daily.  4. Amiodarone 200 mg p.o. Monday through Friday and 100 mg on Saturday      and Sunday.  5. Amlodipine 5 mg p.o. daily.  6.  Potassium 10 mEq p.o. daily.   PHYSICAL EXAMINATION:  VITAL SIGNS:  Blood pressure of 149/91 and his  pulse is 74.  He weighs 234 pounds.  HEENT:  Normal.  NECK:  Supple.  CHEST:  Clear.  CARDIOVASCULAR:  Regular rate and rhythm.  ABDOMEN:  No tenderness.  EXTREMITIES:  No edema.   DIAGNOSES:  1. Recent episode of dizziness - etiology is unclear to me.  Note, the      patient did not have palpitations at the time of the symptoms.  We      will interrogate his implantable cardioverter-defibrillator today      to make sure there was no significant rhythm problem.  Also note      that his defibrillator did not fire.  He also had no chest pain.      We will plan to repeat his echocardiogram to reassess his left      ventricular  function.  Given his symptoms resolved, we will not      pursue this further unless he has recurrent symptoms in the future.      This assumes his left ventricular function remains normal and that      his device interrogation is normal.  2. History of nonischemic cardiomyopathy - we are repeating his      echocardiogram.  He will continue his Coreg.  His ARB was      discontinued previously as it was suggested that it was potentially      causing hives in his mouth.  3. History of frequent premature ventricular contractions - he will      continue on his amiodarone.  Note, he had his last TSH and liver      functions in December and chest x-ray was performed at that time as      well.  4. Hypertension - his blood pressure is mildly elevated today.      However, he states his systolic is running in the 135 range at      home.  We will continue his present medications.  5. Hyperlipidemia - he will continue on his statin.  6. History of ACE inhibitor, aspirin, and now ARB allergy.  7. History of rheumatic fever.  8. Osteoporosis.  9. Amiodarone.  10.History of implantable cardioverter-defibrillator.  11.Abnormal chest CT - the patient will need to follow up  chest CT in      July 2010.   We will see him back in 3 months.     Alan Frieze Alan Som, MD, [facility]  Electronically Signed    BSC/MedQ  DD: 10/30/2008  DT: 10/31/2008  Job #: [id number]   cc:   Bethann Punches

## 2010-12-31 NOTE — Progress Notes (Signed)
[name] OFFICE NOTE   [name]                      MRN:          [id number]  DATE:12/20/2007                            DOB:          08-18-1946    [name] returns today for follow-up.  He is a very pleasant middle-  aged male with a nonischemic cardiomyopathy, questionable PVC-induced  with a family history of sudden cardiac death and history of congestive  heart failure, who returns today for follow-up.  He had recently seen my  partner, Dr. Olga [name] in our [place] office and, at that point,  was complaining of some peripheral edema.  Interestingly enough, his LV  function had initially been in the 30% range, but most recently it was  approximately 50%.  He was started on Lasix.  He returns today for  follow-up.  He had no specific complaints today except that he is  concerned and frustrated about chronic and gradual weight gain.  The  patient states that he does not eat as much as he used to, but admits to  being sedentary.   CURRENT MEDICATIONS:  1. Magnesium oxide.  2. Coreg 50 twice a day.  3. Atacand 32 daily.  4. Norvasc 5 a day.  5. Amiodarone 200 a day.  6. Lasix 20 a day.  7. Lipitor 40 mg half tablet daily.  8. Potassium 20 mEq daily.   PHYSICAL EXAMINATION:  GENERAL APPEARANCE:  He is a pleasant, obese  middle-aged man in no acute distress.  VITAL SIGNS:  Blood pressure was 134/88, pulse 64 and regular,  respirations were 18.  Weight was 240 pounds.  NECK:  No jugular venous distension.  No thyromegaly.  Trachea is  midline.  Carotids are 2+ and symmetric.  LUNGS:  Clear bilaterally to auscultation.  No wheezes, rales or rhonchi  are present.  No increased work of breathing.  CARDIOVASCULAR:  Regular rate and rhythm.  Normal S1 and S2.  EXTREMITIES:  Demonstrate no cyanosis, clubbing or edema.   Interrogation of his defibrillator demonstrates a [employer]  Jude tablets V-  268, P-waves were greater than 3 and R waves were 11, the impedance 420  in the A, 40 in the V, threshold 0.75 at 0.5 in both the right atrium  and right ventricle.  Battery voltage was 3.1 volts.  He was A pacing  less than 1% and V pacing less than 1% of the time.   IMPRESSION:  1. Nonischemic cardiomyopathy.  2. Congestive heart failure.  3. Nonsustained ventricular tachycardia.  4. Status post implantable cardioverter defibrillator insertion.   DISCUSSION:  Overall, [name] is stable.  We talked today about the  importance of weight loss and increasing his exercise.  Plan to see him  back in the office in one year for ICD follow-up.     Doylene Canning. Ladona Ridgel, MD  Electronically Signed    GWT/MedQ  DD: 12/20/2007  DT: 12/20/2007  Job #: [id number]

## 2010-12-31 NOTE — Assessment & Plan Note (Signed)
[facility] OFFICE NOTE   FESTUS, PURSEL                      MRN:          [id number]  DATE:11/08/2007                            DOB:          08-22-1945    [name] is a pleasant gentleman who I have followed for history of  nonischemic cardiomyopathy, improved by most recent echocardiogram as  well as previous ICD and ventricular ectopy.  Since I last saw him, he  is complaining of some orthopnea as well as pedal edema.  This has been  present over the past two weeks.  It is new.  He has not had chest pain,  palpitations and he has had no further presyncopal or syncopal episodes.   MEDICATION:  1. Magnesium oxide 250 mg daily.  2. Meloxicam 15 mg daily day.  3. Coreg 50 mg p.o. b.i.d.  4. Atacand 32 mg p.o. daily.  5. Amiodarone 200 mg daily.  6. Norvasc 10 mg daily.  7. Lipitor 20 mg daily.  8. He has also been taking Lasix 20 mg intermittently and took 40 mg      yesterday but his symptoms do continue.   PHYSICAL EXAMINATION:  VITAL SIGNS:  Today blood pressure 140/80, pulse  59.  Weight is 240 pounds.  HEENT:  Normal.  NECK:  Supple.  CHEST:  Clear.  CARDIOVASCULAR:  Regular rate and rhythm.  ABDOMEN:  Exam shows no tenderness.  EXTREMITIES:  He has 1+ ankle edema.   We have an electrocardiogram today that shows sinus rhythm at a rate of  59.  The axis is normal.  There is a first-degree AV block.  There are  nonspecific ST changes.   DIAGNOSES:  1. New onset pedal edema, possibly secondary to acute on chronic      diastolic congestive heart failure - Mr. Sova is complaining of      orthopnea and pedal edema.  Note, we did perform an echocardiogram      on June 22, 2007.  His ejection fraction was 50-55%.  There was      trivial mitral regurgitation.  We will plan to add Lasix 40 mg p.o.      daily as well as potassium 20 mEq p.o. daily to see if this      improves his  symptoms.  I will check a BMET and a BNP in one week,      follow his potassium and renal function.  If his symptoms do not      improve with the above, then we may need to repeat an      echocardiogram to reassess his left ventricular function.  2. Recent presyncopal episodes - these have resolved and we do not      elicit any rhythm disturbance.  3. History of nonischemic cardiomyopathy - he will continue on his      Coreg and ARB.  4. History of frequent PVCs and nonsustained ventricular tachycardia -      he will continue on his amiodarone.  His most recent liver      functions, TSH  and chest x-ray were normal.  5. Hypertension - we will continue to track his blood pressure.  Note,      he is on Norvasc and this could also be contributing to his pedal      edema.  However, he is also complaining of orthopnea and I      therefore think it is most likely volume excess.  6. Hyperlipidemia - he will continue on his Statin.  7. History of ACE inhibitor and aspirin allergy.  8. History of rheumatic fever.  9. Osteoporosis.  10.Amiodarone use.  11.Status post implantable cardioverter defibrillator.   I will see him back in two to four weeks.     Madolyn Frieze Jens Som, MD, [facility]  Electronically Signed    BSC/MedQ  DD: 11/08/2007  DT: 11/08/2007  Job #: [id number]   cc:   Bethann Punches

## 2010-12-31 NOTE — Assessment & Plan Note (Signed)
[facility]                          CHRONIC HEART FAILURE NOTE   HAKEEN, SHIPES                      MRN:          [id number]  DATE:12/28/2006                            DOB:          Aug 02, 1946    PRIMARY CARDIOLOGIST:  Madolyn Frieze. Jens Som, M.D.   PRIMARY CARE:  Bethann Punches, M.D.   ELECTROPHYSIOLOGIST:  Doylene Canning. Ladona Ridgel, M.D.   Mr. Ditullio returns today for followup of his congestive heart failure  which is secondary to nonischemic cardiomyopathy.  Mr. Turck states  he has been doing quite well.  He was seen by [name] back in  February for a routine cardiology visit.  Mr. Wieting today is asking  when he needs to see [name] again for further decrease titration  of his amiodarone.  Mr. Chelf is also asking if we can check his  device today instead of having him come in next week when he is  scheduled for a device check, and also when he needs to follow up with  Dr. Ladona Ridgel.  Overall, he states he has been doing quite well.  No  symptoms suggestive of volume overload.  Denies any palpitations,  presyncope or syncopal episodes.  He continues to work full-time.  States he has been trying to lose weight.  He has started on a low  carbohydrate diet with his wife.  Also recently was placed on a Holter  monitor for 48 hours by [name].  The patient to follow up with Dr.  Jens Som regarding results.   PAST MEDICAL HISTORY:  1. Includes congestive heart failure secondary to nonischemic      cardiomyopathy.  Cardiac catheterization at [facility] in 2004      showed an EF of 24% with normal coronaries.  Echocardiogram      repeated in August 2007 showed an EF of 50-55%.  2. History of nonsustained ventricular tachycardia status post      implanted cardiovascular defibrillator followed by Dr. Ladona Ridgel.  3. History of moderate to severe MR.  4. Hypertension.  5. Hyperlipidemia.  6. History of allergies to ACE INHIBITOR and  ASPIRIN with development      of hives and angioedema.  7. Remote history of tobacco use greater than 30 years ago.  8. Amiodarone taper in progress.   REVIEW OF SYSTEMS:  As stated above.   MEDICATIONS:  1. Mag-Ox  250 mg daily.  2. Meloxicam 15 mg daily.  3. Lipitor 10 mg daily.  4. Coreg 50 mg b.i.d.  5. Furosemide 10 mg daily.  6. Atacand 32 mg daily.  7. Norvasc 5 mg daily.  8. Amiodarone - the patient takes 100 mg Monday through Thursday and      200 mg on Friday, Saturdays, and Sunday.   PHYSICAL EXAMINATION:  VITAL SIGNS:  Weight 235, blood pressure 132/80  with a pulse of 57.  GENERAL:  Mr.  Cumpton is in no acute distress, very pleasant and  cooperative gentleman.  No JVD at 45-degree angle.  LUNGS:  Clear to auscultation bilaterally.  CARDIOVASCULAR:  S1, S2, regular rate and rhythm.  ABDOMEN:  Soft, nontender, positive bowel sounds.  LOWER EXTREMITIES:  Without clubbing, cyanosis, or edema.  NEUROLOGIC:  The patient alert and oriented x3.  Cranial nerves II-XII  grossly intact.   IMPRESSION:  Stable heart failure at this time.  I have asked Gunnar Fusi to  come in and interrogate the patient's device while he is here to save  him a trip next week.  Device checked without problems.  The patient  scheduled to follow up with Dr. Ladona Ridgel in November.  I am going to have  the patient schedule an appointment to follow up with [name] to  review his Holter monitor and further down-titration of his amiodarone.  We will schedule this at the [facility] office as this is mot convenient  for the patient.  Otherwise, no changes in medications at this time.      Dorian Pod, ACNP       Rollene Rotunda, MD, [facility]    MB/MedQ  DD: 12/28/2006  DT: 12/28/2006  Job #: [id number]   cc:   Bethann Punches

## 2010-12-31 NOTE — Assessment & Plan Note (Signed)
[facility]                            CARDIOLOGY OFFICE NOTE   QUINLIN, CONANT                      MRN:          [id number]  DATE:07/22/2007                            DOB:          04-08-1946    Mr. Pohlmann returns for followup today.  He is a pleasant gentleman  with a history of nonischemic cardiomyopathy improved by most recent  echocardiogram.  He has also had a previous ICD and has a long history  of ventricular ectopy (predominantly PVCs).  I last saw him on June 18, 2007.  He was complaining of presyncopal episodes.  He states that  he will suddenly become dizzy, which lasts for approximately 30 seconds  and resolves spontaneously.  There is no associated chest pain,  shortness of breath, nausea and vomiting, or diaphoresis.  His  defibrillator has not gone off.  There is no loss of strength or  sensation in his extremities, nor is there any incontinence or seizure  activity.  When I saw him on June 18, 2007, we scheduled an  echocardiogram which was performed on June 22, 2007.  The LV function  was normal.  The estimated ejection fraction was 50s-55%.  There was  mild aortic root dilatation.  Note, we also placed a cardiac monitor.  He states he had 3 episodes while the monitor was on.  I have reviewed  the strips and I have one strip that documented lightheadedness.  This  was associated with sinus rhythm at a rate of 60 and a rare PVC.  There  is one brief episode of AIVR, but there were no associated symptoms.   MEDICATIONS:  1. Magnesium oxide 250 mg p.o. daily  2. Meloxicam 50 mg p.o. daily  3. Coreg 50 mg p.o. b.i.d.  4. Atacand 32 mg p.o. daily  5. Amiodarone 100 mg on Monday through Thursday and 200 mg on Friday,      Saturday and Sunday.  6. Lipitor 20 mg p.o. daily  7. Norvasc 10 mg p.o. daily   PHYSICAL EXAMINATION:  Blood pressure 152/90 and the pulse is 63.  He  weighs 237 pounds.  HEENT:  Normal.  NECK:   Supple.  CHEST:  Clear.  CARDIOVASCULAR:  Regular rate and rhythm.  ABDOMEN:  No tenderness.  EXTREMITIES:  Show no edema.   DIAGNOSES:  1. Continuing presyncopal episodes -- etiology of this is unclear.  We      did interrogate his ICD again today and there were no episodes      detected.  A cardio event monitor showed sinus rhythm.  There was a      run of AIVR but this appeared to be unrelated to any symptoms.  For      now, I have asked him to increase his amiodarone back to 200 mg      p.o. daily, although I am not convinced ventricular arrhythmias are      causing any significant problems.  Note, his LV function is normal.      We will ask him to followup with Dr. Ladona Ridgel to see  if he has any      other thoughts concerning these presyncopal episodes.  They do      sound to be potentially rhythm disturbance related, although we      have not documented this.  2. History of nonischemic cardiomyopathy -- this continues to be      improved with an ejection fraction of 50-55%.  He will continue on      his Coreg and ARB.  3. History of frequent PVCs and nonsustained ventricular tachycardia -      - he will continue on his Amiodarone  as described above.  I will      check a chest x-ray, liver functions and TSH.  4. History of mitral regurgitation -- this is improved by most recent      echocardiogram.  5. Hypertension -- his blood pressure is elevated today.  I will track      this and increase his medications as indicated.  6. Hyperlipidemia -- he will continue on his statin.  I will check      lipids and liver and adjust as indicated.  7. History of ACE inhibitor allergy and aspirin allergy.  8. History of rheumatic fever.  9. Osteoporosis.  10.Amiodarone  use.  11.Status post ICD.   I will see him back in 3 months.     Madolyn Frieze Jens Som, MD, [facility]  Electronically Signed    BSC/MedQ  DD: 07/22/2007  DT: 07/22/2007  Job #: [id number]   cc:   Bethann Punches

## 2010-12-31 NOTE — Progress Notes (Signed)
HPI: Mr. Becvar is a pleasant gentleman, who has a history of nonischemic cardiomyopathy improved by most recent echocardiogram.  His last echo on November 07, 2008, showed an ejection fraction of 50%.  There was mild aortic insufficiency and mitral regurgitation.  There was moderate left atrial enlargement and mild tricuspid regurgitation.  He also has had a previous ICD.  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 I last saw him in Nov of 2011. Since then   Current Outpatient Prescriptions  Medication Sig Dispense Refill  . ALPRAZolam (XANAX) 0.25 MG tablet Take 0.25 mg by mouth at bedtime as needed.        Marland Kitchen amiodarone (PACERONE) 200 MG tablet 1 tab po qd mon-thur......100mg  fri sat and sunday       . amLODipine (NORVASC) 5 MG tablet Take 5 mg by mouth daily.        Marland Kitchen atorvastatin (LIPITOR) 40 MG tablet Take 40 mg by mouth daily.        . carvedilol (COREG) 25 MG tablet 2 tabs po bid      . Fluticasone-Salmeterol (ADVAIR DISKUS) 250-50 MCG/DOSE AEPB Inhale 1 puff into the lungs as needed.        . furosemide (LASIX) 20 MG tablet Take 20 mg by mouth 2 (two) times daily.        Marland Kitchen KLOR-CON M20 20 MEQ tablet 1 tablet as directed.      . magnesium gluconate (MAGONATE) 500 MG tablet 1 tab po qd       . SINGULAIR 10 MG tablet 1 tablet at bedtime.         Past Medical History  Diagnosis Date  . CHF (congestive heart failure)   . Cardiomyopathy   . PVC (premature ventricular contraction)   . Hyperlipidemia   . HTN (hypertension)   . Osteoporosis   . Abnormal chest CT     Past Surgical History  Procedure Date  . Knee arthroplasty     Total left knee 2006  . Knee arthroplasty     Total right knee x2  . Implantation of dual-chamber icd.     History   Social History  . Marital Status: Married    Spouse Name: N/A    Number of Children: N/A  . Years of Education: N/A   Occupational History  . Not on file.    Social History Main Topics  . Smoking status: Former Smoker -- 1.0 packs/day for 10 years    Types: Cigarettes    Quit date: 12/31/1970  . Smokeless tobacco: Never Used  . Alcohol Use: Yes  . Drug Use: No  . Sexually Active: Not on file   Other Topics Concern  . Not on file   Social History Narrative  . No narrative on file    ROS: no fevers or chills, productive cough, hemoptysis, dysphasia, odynophagia, melena, hematochezia, dysuria, hematuria, rash, seizure activity, orthopnea, PND, pedal edema, claudication. Remaining systems are negative.  Physical Exam: Well-developed well-nourished in no acute distress.  Skin is warm and dry.  HEENT is normal.  Neck is supple. No thyromegaly.  Chest is clear to auscultation with normal expansion.  Cardiovascular exam is regular rate and rhythm.  Abdominal exam nontender or distended. No masses palpated. Extremities show no edema. neuro grossly intact  ECG Sinus rhythm at a rate of 62. Nonspecific ST changes. First degree block.

## 2010-12-31 NOTE — Assessment & Plan Note (Signed)
[facility] OFFICE NOTE   MATHEO, RATHBONE                      MRN:          [id number]  DATE:06/18/2007                            DOB:          07-May-1946    ADDENDUM   We did interrogate Mr. Mellone's device in the clinic today.  There  were no incidences of antitachycardia pacing or ICD discharges.  There  was also no evidence of ventricular tachycardia.  I wonder whether he  may be having any arrhythmias lower than his threshold of 160.  We will  therefore schedule him to have a CardioNet monitor to make sure that he  is not having any rhythm disturbances.  I have instructed him that he  should not drive until we clear this issue up.  Also for the rash on his  feet, I am not clear on the etiology.  I have asked him to see Dr.  Hyacinth Meeker concerning this issue.  He may need to see a dermatologist.  I  have not seen this occur with Amiodarone in the past.  I will see him  back in approximately four weeks after we have the echocardiogram and  the CardioNet results.     Madolyn Frieze Jens Som, MD, [facility]  Electronically Signed    BSC/MedQ  DD: 06/18/2007  DT: 06/18/2007  Job #: [id number]   cc:   Bethann Punches

## 2010-12-31 NOTE — Assessment & Plan Note (Signed)
[facility] OFFICE NOTE   WYETH, HOFFER                      MRN:          [id number]  DATE:06/18/2007                            DOB:          January 26, 1946    [name] is a very pleasant gentleman who has a history of a  nonischemic cardiomyopathy, improved on most recent echocardiogram (his  most recent echocardiogram in August, 2007 showed an ejection fraction  of 50-55%.  There was mild aortic root dilatation, mild mitral  regurgitation, and mild tricuspid regurgitation).  Note, he has had a  previous catheterization at [facility] in June, 2004 that showed  normal coronary arteries.  Since he was last seen, he is having episodes  of presyncope.  This has happened over the past 3-4 weeks.  He has had  two significant episodes.  Both times was when he was in his car.  One  time he was driving and felt he may have a syncopal episode, and he  pulled over to the side of the road.  He had sudden onset of dizziness,  but there was no chest pain, palpitations, nausea, vomiting, and his  defibrillator did not fire.  There was no loss of strength or sensation  in his extremities, nor was there any incontinence or seizure activity.  This lasted for several seconds and resolved.  He had a subsequent  episode while sitting at his car at a [employer].  This one  was more severe, but again was a few seconds in duration.  Note, he has  not had any increased dyspnea on exertion, orthopnea, PND, palpitations,  or chest pain.  He also is complaining of a rash on the bottom of his  feet bilaterally.  There is no rash in any other places, nor is there  any fevers or chills.   MEDICATIONS:  1. Magnesium oxide 250 mg p.o. daily.  2. Miloxicam 15 mg p.o. daily.  3. Coreg 50 mg p.o. b.i.d.  4. Atacand 32 mg p.o. daily.  5. Norvasc 5 mg p.o. daily.  6. Amiodarone 100 mg on Monday through Thursday, and 200 mg  on Friday,      Saturday, and Sunday.  7. Lipitor 20 mg p.o. daily.   PHYSICAL EXAMINATION:  Blood pressure 162/94 in the lying position.  In  the standing position, it is 150/90 with a pulse of 68.  He has not had  any orthostatic symptoms at home.  HEENT:  Normal.  NECK:  Supple.  CHEST:  Clear.  CARDIOVASCULAR:  Regular rate and rhythm.  ABDOMEN:  No tenderness.  EXTREMITIES:  No edema.  He has 2+ dorsalis pedis and posterior tibial  pulses bilaterally.  On the dorsal aspects of his feet, he has a  desquamated rash that he is applying cortisone to.  I cannot appreciate  a rash in any other location.   Note, he did have laboratories drawn recently on April 07, 2007 that  showed normal LFTs.  His cholesterol was 206, but his LDL was 110.  In  July, he had a normal  potassium and a normal TSH.  He also had a chest x-  ray on July 2nd that showed no change in the pacer ICD lead and stable  cardiomegaly.   His electrocardiogram shows a sinus rhythm at a rate of 66.  There is a  first-degree AV block and nonspecific ST changes are noted.   DIAGNOSES:  1. Presyncopal episodes:  His description of the symptoms are      extremely concerning for ventricular arrhythmia.  We will have his      device interrogated.  If there is indeed increased arrhythmia, then      we may need to arrange a cardiac catheterization to exclude new-      onset ischemia.  We will also plan to repeat his echocardiogram.  I      will discuss with Dr. Ladona Ridgel whether we need to adjust his anti-      arrhythmics if he indeed is found to have ventricular tachycardia.  2. History of nonischemic cardiomyopathy, improved on the most recent      echocardiogram.  We will continue with his Coreg, ARB.  He is      euvolemic on exam today.  Note, his most recent echocardiogram had      showed improvement in his left ventricular function, and we will      plan to repeat this.  3. History of frequent premature ventricular  contractions and      nonsustained ventricular tachycardia.  The thought previously was      that this may be contributing to his nonischemic cardiomyopathy,      given the frequency of his ectopy.  We will continue with his      amiodarone for now.  4. History of moderate-to-severe mitral regurgitation, worse with      premature ventricular contractions.  This is improved on his most      recent echocardiogram.  5. Hypertension:  His blood pressure is elevated today, and I will      increase his Norvasc to 10 mg p.o. daily.  6. Hyperlipidemia:  He will continue on his statin.  7. History of ACE INHIBITOR allergy and ASPIRIN allergy.  8. History of rheumatic fever.  9. Osteoarthritis.  10.Amiodarone:  He will need his amiodarone surveillance laboratories      in January.  11.History of implantable cardioverter/defibrillator.  I will make      further recommendations.  We will interrogate his implantable      cardioverter/defibrillator.     [name] Jens Som, MD, [facility]  Electronically Signed    BSC/MedQ  DD: 06/18/2007  DT: 06/18/2007  Job #: [id number]

## 2010-12-31 NOTE — Assessment & Plan Note (Signed)
[facility]                            CARDIOLOGY OFFICE NOTE   XACHARY, HAMBLY                      MRN:          [id number]  DATE:07/27/2008                            DOB:          [birth year]    [name] is a pleasant gentleman who has a history of nonischemic  cardiomyopathy improved by most recent echocardiogram.  He also has had  a previous ICD placed.  Since I last saw him, he is doing well from a  symptomatic standpoint with no dyspnea, chest pain, palpitations, or  syncope.  Approximately 3 weeks ago, he did have problems with hives  including involvement of his tongue and mouth.  It was felt that this is  most likely the Atacand and it was discontinued and his symptoms have  improved.   His present medications include:  1. Carvedilol 50 mg p.o. b.i.d.  2. Lipitor 20 mg p.o. daily.  3. Amiodarone 200 mg Monday through Friday and 100 on Saturday and      Sunday.  4. Potassium 10 mEq p.o. daily.  5. Lasix 20 mg p.o. daily.  6. Norvasc 5 mg p.o. daily.  7. Magnesium oxide.   Physical exam today shows a blood pressure 145/82 and his pulse is 81.  His HEENT is normal.  His neck is supple.  His chest is clear.  His  cardiovascular exam reveals a regular rate.  Abdominal exam shows no  tenderness.  Extremities show no edema.   His electrocardiogram shows a sinus rhythm at a rate of 59.  The axis is  normal.  There are nonspecific ST changes.  Note, there is a first-  degree AV block.   DIAGNOSES:  1. Nonischemic cardiomyopathy - the patient's left ventricular      function has returned to normal on his most recent echocardiogram.      We will continue his present medications.  We will avoid ACE      INHIBITORS and ARBs given his history of possible allergic      reaction.  His blood pressure is borderline, and if it remains      elevated we will increase his Norvasc.  2. History of frequent premature ventricular contractions  and      nonsustained ventricular tachycardia - we felt this is possibly      causing his cardiomyopathy in the past.  We will continue on his      amiodarone.  We will check liver functions, TSH, and chest x-ray.  3. Hypertension - as per above, we may increase his Norvasc in the      near future if his blood pressure remains elevated off the Atacand.  4. Hyperlipidemia - he will continue on his statin, and I will have      his most recent lipids forward to [place] for our records.  5. History of ACE INHIBITOR, ASPIRIN, and now ARB ALLERGY.  6. History of rheumatic fever.  7. Osteoporosis.  8. Amiodarone use - as per above.  9. Status post implantable cardioverter-defibrillator.   I will see him back in 6 months or  sooner if necessary.     Madolyn Frieze Jens Som, MD, [facility]  Electronically Signed    BSC/MedQ  DD: 07/27/2008  DT: 07/27/2008  Job #: [id number]   cc:   Bethann Punches

## 2010-12-31 NOTE — Patient Instructions (Signed)
Your physician wants you to follow-up in: 6 MONTHS You will receive a reminder letter in the mail two months in advance. If you don't receive a letter, please call our office to schedule the follow-up appointment.   Your physician recommends that you return for lab work in: TODAY  

## 2010-12-31 NOTE — Assessment & Plan Note (Signed)
Continue statin. 

## 2010-12-31 NOTE — Assessment & Plan Note (Signed)
Continue amiodarone. 

## 2010-12-31 NOTE — Assessment & Plan Note (Signed)
Most recent echocardiogram showed improved LV function. Continue beta blocker. His previous cardiomyopathy is felt possibly secondary to frequent PVCs. Continue amiodarone. Check TSH and liver functions. Will have his most recent chest x-ray sent to [place] from his primary care physician's office.

## 2010-12-31 NOTE — Assessment & Plan Note (Signed)
[facility]                            CARDIOLOGY OFFICE NOTE   [name], [name]                      [name]  DATE:02/17/2007                            DOB:          1946/02/29    [name] is a very pleasant gentleman who has a history of non-  ischemic cardiomyopathy with normal coronary arteries. His most recent  echocardiogram was performed on April 01, 2006, that showed an ejection  fraction improving to 50-55%. There was mild mitral regurgitation. There  was also mild tricuspid regurgitation. He does have a defibrillator in  place due to a long history of ventricular ectopy and notes at the time  that he was implanted he also had severe LV dysfunction. It is felt that  his cardiomyopathy may be related to his long asymptomatic ectopy. Since  I last saw him, there is no chest pain, dyspnea, palpitations or  syncope. There is no pedal edema.   MEDICATIONS:  1. Magnesium oxide 250 mg p.o. daily.  2. Meloxicam 15 mg p.o. daily.  3. Lipitor 10 mg p.o. daily.  4. Coreg 50 mg p.o. b.i.d.  5. Lasix 10 mg p.o. daily.  6. Atacand 32 mg p.o. daily.  7. Norvasc 5 mg p.o. daily.  8. Amiodarone 100 mg on Mondays through Thursdays and amiodarone 200      mg on Friday, Saturday and Sunday.   PHYSICAL EXAMINATION:  Shows a blood pressure of 133/79, pulse 63. He  weighs 232 pounds.  HEENT: Is normal.  NECK: Supple, with no bruits.  CHEST: Clear.  CARDIOVASCULAR: Reveals a regular rate and rhythm.  ABDOMEN: Benign.  EXTREMITIES: Show no edema.   ELECTROCARDIOGRAM: Shows a sinus rhythm at a rate of 60. There is a  first-degree AV block. There is poor R-wave progression and a prior  anterior and inferior infarct cannot be excluded. Note, a Holter monitor  performed in March showed continued frequent PVCs.   DIAGNOSES:  1. History of non-ischemic cardiomyopathy: His left ventricular      function has improved by most recent  echocardiogram. The etiology      of his cardiomyopathy has never been clear, but it may have been      related to a significant ectopy. We will continue his amiodarone. I      will check a TSH, liver function and chest x-ray today given his      amiodarone use. We will also a check a BMET to follow his potassium      and renal function.  2. History of frequent PVCs and nonsustained ventricular tachycardia:      As per #1.  3. History of moderate to severe mitral regurgitation worse with PVCs.      This is improved on most recent echocardiogram.  4. Hypertension: His blood pressure is well-controlled.  5. Hyperlipidemia: He will continue on his Lipitor and we will check      lipids today.  6. History of ACE INHIBITOR ALLERGY AND ASPIRIN ALLERGY.  7. History of rheumatic fever.  8. Osteoarthritis.  9. Amiodarone.  10.History of implantable  cardioverter defibrillator (ICD): He will      followup with Dr.  Ladona Ridgel concerning this issue.   I will see him back in six months.     Madolyn Frieze Jens Som, MD, [facility]  Electronically Signed    BSC/MedQ  DD: 02/17/2007  DT: 02/17/2007  Job #: [id number]

## 2010-12-31 NOTE — Progress Notes (Signed)
[facility] OFFICE NOTE   [name], [name]                      MRN:          [id number]  DATE:12/18/2008                            DOB:          [birth year]    [name] returns today.  [name] is a very pleasant middle-aged  male with syncope and nonischemic cardiomyopathy and history of VT.  The  patient is status post prophylactic ICD insertion back in 2007 and  returns today for followup.  He notes an episode back in March when he  felt like he was having a massive heart attack and unfortunately further  evaluation was unremarkable.  He subsequently underwent a 2-D echo which  demonstrated an EF of 50%.  He returns today for followup.  He denies  chest pain or shortness of breath.  He has had no anginal symptoms.  He  notes occasional episodes of peripheral edema which he relates to  indiscretion with sodium.  He denies currently nausea, vomiting,  diarrhea, or constipation.   MEDICATIONS:  1. Carvedilol 50 twice a day.  2. Lipitor 40 mg half tablet daily.  3. Amiodarone 200 mg Monday through Friday and 100 mg Saturday and      Sunday.  4. Potassium 20 mEq half tablet daily.  5. Lasix 20 a day.  6. Norvasc 5 a day.  7. Magnesium oxide.   PHYSICAL EXAMINATION:  GENERAL:  He is a pleasant, well-appearing middle-  aged man in no distress.  VITAL SIGNS  Blood pressure was 144/90, the pulse 64 and regular,  respirations were 18, the weight was 238 pounds.  NECK:  No jugular venous distention.  LUNGS:  Clear bilaterally on auscultation.  There were no wheezes,  rales, or rhonchi.  There was no increased work of breathing.  CARDIOVASCULAR:  Regular rate and rhythm.  Normal S1 and S2.  There were  no murmurs, rubs, or gallops appreciated.  ABDOMEN:  Soft, nontender.  EXTREMITIES:  Trace peripheral edema bilaterally.   Interrogation of his defibrillator demonstrates a [employer] Jude  dual-chamber  device.  The P-waves were 3, the R-waves were 11, the impedance 439, the  atrium 490, and the ventricle threshold 0.5 and 0.5 in the A and 0.75  and 0.5 in the V.  Battery voltage was 2.98 volts.   IMPRESSION:  1. Nonischemic cardiomyopathy with improvement in left ventricular      function.  2. History of syncope and ventricular tachycardia status post      implantable cardioverter-defibrillator insertion.  3. Class I congestive heart failure.   DISCUSSION:  [name] was stable.  The episode back in March that  made him feel so bad is still unclear as to what was going on.  For now,  we recommend a period of watchful waiting.  We will continue his current  medical therapy except I have asked that he decrease his amiodarone  today to 200 mg Monday through Thursday and 100 mg Friday, Saturday, and  Sunday.     Doylene Canning. Alan Ridgel, MD  Electronically Signed    GWT/MedQ  DD: 12/18/2008  DT: 12/18/2008  Job #: [id number]  cc:   Bethann Punches

## 2010-12-31 NOTE — Assessment & Plan Note (Signed)
Management per electrophysiology. 

## 2010-12-31 NOTE — Assessment & Plan Note (Signed)
[facility]                            CARDIOLOGY OFFICE NOTE   PRENTICE, SACKRIDER                      MRN:          [id number]  DATE:02/03/2008                            DOB:          1946-07-09    [name] is a very pleasant 65 year old gentleman who has a history  of nonischemic cardiomyopathy improved by most recent echocardiogram.  His last echo was performed on June 22, 2007.  At that time, his  ejection fraction was 50% to 55%.  There was mild aortic root  dilatation.  There was trivial aortic and mitral regurgitation.  He has  also had a previous ICD.  Since I last saw him, he feels well.  He  denies any dyspnea, chest pain, palpitations, or syncope.  His ICD has  not fired.  His pedal edema has improved on the Lasix and the lower dose  of Norvasc.   MEDICATIONS:  1. Carvedilol 50 mg p.o. b.i.d.  2. Lipitor 20 mg p.o. daily.  3. Amiodarone 200 mg on Monday through Friday and 100 on Saturday and      Sunday.  4. Magnesium oxide.  5. Meloxicam.  6. Atacand 32 mg p.o. daily.  7. Potassium 20 mEq p.o. daily.  8. Norvasc 5 mg p.o. daily.  9. Lasix 20 mg p.o. daily.   PHYSICAL EXAM:  VITAL SIGNS:  Blood pressure of 130/80 and his pulse is  70.  He weighs 244 pounds.  HEENT:  Normal.  NECK:  Supple.  CHEST:  Clear.  CARDIOVASCULAR:  Regular rate and rhythm.  ABDOMINAL EXAM:  Shows no tenderness.  EXTREMITIES:  Show no edema.   DIAGNOSES:  1. History of nonischemic cardiomyopathy - his left ventricular      function has improved on his most recent echocardiogram.  He will      continue on his Coreg as well as his ARB.  He will also continue on      his Lasix and his potassium.  He had laboratories drawn recently      and we will have his most recent BMET forwarded to [place].  2. History of frequent premature ventricular contractions and      nonsustained ventricular tachycardia - we have felt that this may      have caused his  cardiomyopathy.  He will continue on his      amiodarone.  We will have his most recent liver functions and TSH      forwarded to [place] as well as performing a chest x-ray today.  3. Hypertension - his blood pressure is under adequate control on his      present medications.  4. Hyperlipidemia - he will continue on statin.  I will have his most      recent lipids forwarded to [place].  5. History of ACE inhibitor and aspirin allergy.  6. History of rheumatic fever.  7. Osteoporosis.  8. Amiodarone use.  9. Status post implantable cardioverter-defibrillator.   We will see him back in 6 months.     [name] Alan Som, MD, [name]  Electronically Signed  BSC/MedQ  DD: 02/03/2008  DT: 02/04/2008  Job #: [id number]   cc:   Bethann Punches

## 2010-12-31 NOTE — Assessment & Plan Note (Signed)
Continue present medications. Check potassium and renal function. 

## 2011-01-01 ENCOUNTER — Encounter: Payer: Self-pay | Admitting: Cardiology

## 2011-01-03 NOTE — Letter (Signed)
June 11, 2006     Merlyn Albert with [employer]  [employer]  [address]  [place], [place]  [address]   RE:  KWAN, SHELLHAMMER  MRN:  [id number]  /  DOB:  Jul 16, 1946   To Whom It May Concern:   This letter is regarding Mr. Mirza Fessel, a 65 year old Caucasian  gentleman followed by [facility].  Mr.  Golson has been followed closely regarding his congestive heart failure  secondary to non-ischemic cardiomyopathy.  At this time, I do not see any  apparent reason that Mr. Mullett cannot continue to be very active at his  place of employment.  This includes driving.  Mr. Reuss is very stable in  regards to his heart failure and compliant with his doctors orders.  If you  have any questions or concerns, feel free to call me at [phone] or I  can be paged at [phone].    Sincerely,      ______________________________  Dorian Pod, ACNP    ______________________________  Bevelyn Buckles. Bensimhon, MD   MB/MedQ  DD: 06/11/2006  DT: 06/12/2006  Job #: [id number]

## 2011-01-03 NOTE — Assessment & Plan Note (Signed)
[place] [facility]                   COUMADIN / CHRONIC HEART FAILURE CLINIC NOTE   COVEY, BALLER                      MRN:          [id number]  DATE:04/16/2006                            DOB:          1946-03-07    Mr. Bordwell returns today for further evaluation and medication titration  of his congestive heart failure secondary to nonischemic cardiomyopathy.  Mr. Klingensmith states he has been doing quite well.  I had seen him back in  July, and he was complaining of a dry cough, apparently saw [name]  since that time who decreased his Lasix from 40 to 20 mg daily and then also  saw his primary care physician who decreased his Atacand from 32 to 16 mg.  The patient states he overall is feeling quite well; however, the patient  had an echocardiogram earlier this month.  His ejection fraction, which was  previously 20 to 25%, has recovered nicely with a range of 50 to 55% at this  time.  The patient is tolerating full-dose Coreg without any problems.  Denies any orthopnea, PND, presyncope or syncopal episodes.   Medical history includes:  1. Congestive heart failure, secondary nonischemic cardiomyopathy.      Cardiac catheterization at [facility] in June 2004 showed an EF of      24% with normal coronary arteries.  2. History of nonsustained V-tach.  3. History of moderate-to-severe mitral regurgitation (worse with      premature ventricular contractions).  4. Status post implantable cardioverter defibrillator.  5. Hypertension.  6. Hyperlipidemia.  7. Questionable history of allergies to ACE inhibitor and aspirin.  8. Osteoarthritis.  9. History of rheumatic fever.   Review of systems as stated above.  History of present illness otherwise  negative.   Current medications include mag oxide 250 mg daily, Lipitor 10 mg daily,  meloxicam 15 mg daily, amiodarone 200 mg daily, Coreg 50 mg b.i.d., Atacand  16 mg daily, furosemide 20 mg  daily.   PHYSICAL EXAMINATION:  VITAL SIGNS:  Weight 232 pounds.  Blood pressure  148/88.  Pulse of 76.  GENERAL:  Mr. Stitely is in no acute distress.  Very pleasant and  cooperative 65 year old Caucasian gentleman.  NECK:  Supple without lymphadenopathy.  Negative bruit.  Negative JVD.  CHEST:  Clear to auscultation bilaterally.  CARDIOVASCULAR:  Exam reveals a regular rate and rhythm.  ABDOMEN:  Soft, nontender.  Positive bowel sounds.  EXTREMITIES:  Without clubbing, cyanosis or edema.   IMPRESSION:  Stable class I to class II heart failure.  Continue current  dose of Coreg.  The patient is hypertensive today.  I am going to have his  Atacand returned, increased back to 32 mg.  I am not sure the rationale for  decreasing his Atacand which was done by his primary care physician.   Recent lab work ordered by [name] shows a potassium of 4.2, BUN and  creatinine of 27 and 1.5, normal TSH.  Chest x-ray was also done.  I do not  have any results available from that.   Overall the patient appears to be quite stable.  I  would like to have his  blood pressure lowered.  He normally runs 130s over 70s.  Hopefully the  Atacand will bring it back down, otherwise, per [name] and primary  care physician Dr. Hyacinth Meeker.                                 Dorian Pod, ACNP    MB/MedQ  DD:  04/16/2006  DT:  04/17/2006  Job #:  [id number]

## 2011-01-03 NOTE — Assessment & Plan Note (Signed)
[facility]                            CARDIOLOGY OFFICE NOTE   KEI, MCELHINEY                      MRN:          [id number]  DATE:10/01/2006                            DOB:          1946/03/27    Mr.  Cieslinski returns for follow up today.  Since I last saw him he  denies any dyspnea, chest pain, palpations or syncope.  He has had a  cough for 3 weeks and it sounds like he may have bronchitis.   MEDICATIONS:  1. Magnesium oxide 250 mg p.o. daily.  2. Meloxicam.  3. Lipitor 10 mg p.o. daily.  4. Coreg 15 mg p.o. q.i.d.  5. Lasix 20 mg p.o. daily.  6. Atacand 32 mg p.o. daily.  7. Amiodarone 200 mg on Mondays, Tuesday, Wednesday, Thursday and      Friday and Amiodarone 100 mg on Saturdays and Sundays.   PHYSICAL EXAMINATION:  Shows a blood pressure of 146/91 and his pulse is  66.  CHEST:  Clear.  CARDIOVASCULAR:  Regular rate and rhythm.  EXTREMITIES:  Show no edema.   His left cardiogram shows a sinus rhythm at a rate of 62.  There is a  first degree AD block.  There is non-specific ST changes.  A prior  infarct can not be excluded.   DIAGNOSES:  1. History of non ischemic cardiomyopathy.  2. History of frequent PVCs and nonsustained ventricular tachycardia.  3. History of moderate to severe mitral regurgitation worse with PVCs.  4. Hypertension.  5. Hyperlipidemia.  6. History ACE inhibitor allergy and aspirin allergy.  7. History of rheumatic fever.  8. Osteoarthritis.  9. Amiodarone taper.   PLAN:  Mr. Mielke is doing extremely from a symptomatic standpoint.  We will check a TSH, liver functions and chest x-ray given his  Amiodarone use.  I will also check a BMET to follow his potassium and  renal function.  His blood pressure is elevated today and I will ask him  to add Norvasc 5 mg p.o. daily for better blood pressure control.  I  discussed the patient with Dr. Ladona Ridgel today.  We will decrease his  Amiodarone to 200 mg on  Fridays, Saturdays and Sundays and 100 mg all  other days.  His most recent echocardiogram showed improvement in his  left ventricular function.  He will see me back in 3 months.  I will  most likely repeat his Holter monitor  in 2 months to make sure that his ectopy does not worsen on the lower  dose of Amiodarone.  Note we felt that his ectopy may have caused his  cardiomyopathy in the past.     Madolyn Frieze. Jens Som, MD, [facility]  Electronically Signed    BSC/MedQ  DD: 10/01/2006  DT: 10/01/2006  Job #: [id number]   cc:   Bethann Punches

## 2011-01-03 NOTE — Op Note (Signed)
NAMECORRADO, HYMON NO.:  [id number]   MEDICAL RECORD NO.:  [id number]          PATIENT TYPE:  INP   LOCATION:  2899                         FACILITY:  [facility]   PHYSICIAN:  Doylene Canning. Ladona Ridgel, M.D.  DATE OF BIRTH:  12-Mar-1946   DATE OF PROCEDURE:  09/17/2005  DATE OF DISCHARGE:                                 OPERATIVE REPORT   PROCEDURE PERFORMED:  Implantation of dual-chamber ICD.   INDICATION:  Nonischemic cardiomyopathy, class II heart failure, EF of 25%  despite maximal medical therapy in conjunction with severe to moderate  mitral regurgitation and very dense episodes of nonsustained ventricular  tachycardia, all in a patient with a very strong family history of sudden  cardiac death.   INTRODUCTION:  The patient is a 65 year old man with a history of  longstanding ventricular ectopy. He underwent EP study over 20 years ago.  In the past he had preserved LV function but was noted to have LV  dysfunction by catheterization approximately 2 years ago. Repeat echoes  demonstrated EF the 40-50% range. However, most recently his heart failure  has worsened despite medical therapy and his EF is decreased to the 25%  range. He continued to have very dense ventricular ectopy and moderate to  severe mitral regurgitation (overestimating his EF). In addition, the  patient has a very strong family history of sudden cardiac death and for all  the above reasons is admitted for implantation of ICD. Of note the patient's  need for antiarrhythmic drug therapy to suppress his ventricular ectopy will  require dual-chamber defibrillator implant for backup bradycardia pacing.   PROCEDURE:  After informed consent was obtained, the patient is taken to  diagnostic EP lab in fasting state. Usual preparation and draping,  intravenous fentanyl and Midazolam was given for sedation. A total of 30 mL  lidocaine was infiltrated in the left infraclavicular region. A 9 cm  incision was  carried out over this region. Electrocautery utilized to  dissect down to the fascial plane. 10 mL of contrast injected in the left  upper extremity venous system demonstrating a patent left subclavian vein.  It was subsequently punctured x2 and the St. Jude model 7001 65-cm active  fixation defibrillation lead serial number [id number] was advanced to the  right ventricle and a St. Jude model 1788T 52-cm active fixation pacing lead  serial number [id number] was advanced into the right atrium. Mapping was  carried out in the right ventricle and at the final site R-waves measured 12  mV and the pacing threshold 0.7 volts, 0.5 milliseconds. Pace impedance was  842 ohms. With the defibrillation lead in satisfactory position, attention  was then turned to placement atrial lead. It was placed in the right atrial  appendage where P-waves measured 3 mV and the pace impedance was 503 ohms.  With the lead actively fixed, the pacing threshold 0.7 volts, 0.5  milliseconds and 10 volts pacing did not stimulate the diaphragm. With both  atrial and ventricular leads in satisfactory position, they were secured to  the subpectoralis fascia with a figure-of-eight silk suture. In addition the  sewing sleeve was secured with silk suture. Electrocautery utilized to make  subcutaneous pocket. Kanamycin irrigation was utilized to irrigate the  pocket. Electrocautery utilized to assure hemostasis. The St. Jude Atlas 2+  DR model V - 268 dual-chamber ICD was connected to the atrial and  defibrillation leads and placed in the subcutaneous pocket. Generator  secured with silk suture. Additional kanamycin was utilized to irrigate the  pocket and defibrillation threshold testing carried out.   After the patient was more deeply sedated with fentanyl and Versed, VF was  induced with T-wave shock. 15 joules shock was delivered which terminated  ventricular fibrillation restored sinus rhythm. 5 minutes was allowed to   elapse and a second DFT test carried out. This time, VF was induced with the  fibbers and with fibber mode and a second 15 joules shock was delivered  which terminated ventricular fibrillation and restored sinus rhythm. At this  point no additional defibrillation threshold testing was carried out and the  incision was closed with layer of 2-0 Vicryl followed by layer 3-0 Vicryl  followed by layer of 4-0 Vicryl. Benzoin was painted on the skin and Steri-  Strips were applied and pressure dressing was placed. The patient was  returned to his room in satisfactory condition.   COMPLICATIONS:  There were no immediate procedure complications.   RESULTS:  Demonstrates successful implantation of a St. Jude dual-chamber  ICD in a patient with a nonischemic cardiomyopathy, congestive heart  failure, EF 25%, moderate severe mitral regurgitation and prolonged  nonsustained ventricular tachycardia.           ______________________________  Doylene Canning. Ladona Ridgel, M.D.     GWT/MEDQ  D:  09/17/2005  T:  09/17/2005  Job:  [id number]   cc:   Bethann Punches  Fax: [phone]   Olga Millers, M.D. [facility]  [address]  [address]  [place]  [place] [address]

## 2011-01-03 NOTE — Assessment & Plan Note (Signed)
[facility]                              CARDIOLOGY OFFICE NOTE   KINGDOM, VANZANTEN                      MRN:          [id number]  DATE:04/01/2006                            DOB:          Dec 26, 1945    [name] returns for followup today.  Since I last saw him, his  medications have been titrated in the heart failure clinic.  He is doing  extremely well with no dyspnea, chest pain, palpitations, or syncope.  His  one complaint is that he is having a dry throat.   His medications at present include:  1. Magnesium oxide 250 mg p.o. every day.  2. Potassium 20 mEq p.o. b.i.d.  3. Lasix 40 mg p.o. every day.  4. Meloxicam.  5. Atacand 32 mg p.o. every day.  6. Lipitor 10 mg p.o. every day.  7. Amiodarone 200 mg p.o. every day.  8. Coreg 50 mg p.o. b.i.d.   PHYSICAL EXAMINATION:  VITAL SIGNS:  Show a blood pressure of 130/72 and his  pulse is 60.  He was 234 pounds.  CHEST:  Clear.  CARDIOVASCULAR:  Reveals a regular rate and rhythm.  EXTREMITIES:  Show no edema.   His electrocardiogram shows a sinus rhythm at a rate of 59.  There is a  first degree AV block.  There are nonspecific ST changes.   DIAGNOSES:  1. Nonischemic cardiomyopathy.  2. History of nonsustained ventricular tachycardia.  3. History of moderate to severe mitral regurgitation (worse with      premature ventricular contractions).  4. Hypertension.  5. Hyperlipidemia.  6. Question history of allergy to ACE inhibitor and aspirin.  7. History of osteoarthritis.  8. History of rheumatic fever.   PLAN:  1. Mr. Geller is doing extremely well from a somatic standpoint.  Note,      he did have laboratories checked on March 05, 2006, and his BUN and      creatinine were 26 and 1.7, and his BNP was 55.  I am not convinced      that he will require as high a dose of Lasix and we will decrease that      to 20 mg p.o. every day and discontinue his potassium.  2. We will have  him return in 1 week and we will check liver functions,      TSH, and chest x-ray for his amiodarone use.  I will also check a B-MET      to follow his potassium and renal function as well as his lipids.  3. He did have an echocardiogram today to reassess his mitral      regurgitation.  4. He will continue to follow up with me in approximately 6 months.  5. He will follow up with Dr. Hyacinth Meeker for his primary medical issues.  6. I have also asked him to follow up with Dr. Hyacinth Meeker concerning his dry      throat.  Madolyn Frieze Jens Som, MD, [facility]    BSC/MedQ  DD:  04/01/2006  DT:  04/01/2006  Job #:  [id number]   cc:   Bethann Punches

## 2011-01-03 NOTE — Assessment & Plan Note (Signed)
[facility]                         ELECTROPHYSIOLOGY OFFICE NOTE   TAMER, BAUGHMAN                      MRN:          [id number]  DATE:07/16/2006                            DOB:          [birth year]    Mr. Greenblatt returns today for followup.  He is a very pleasant, middle-  aged male with a history of ventricular tachycardia and a nonischemic  cardiomyopathy and mitral regurgitation.  He has had up titration of his  dose of beta blocker and this continues to do well.  He has had no  palpitations and, overall, no intercurrent IC therapies.  He feels well.   EXAM:  He is a pleasant, well-appearing, middle-aged man in no distress.  Blood pressure today was 146/80.  The pulse 60 and regular.  Respirations were 18 and the weight was 235 pounds.  NECK:  No jugular venous distention.  LUNGS:  Clear bilaterally to auscultation.  CARDIOVASCULAR:  Regular rate and rhythm with normal S1 and S2.  EXTREMITIES:  No edema.   His medications include:  1. Coreg 50 mg twice daily.  2. Amiodarone 200 mg daily.  3. Lipitor 10 mg daily.  4. Furosemide 20 mg daily.  5. Atacand 32 mg daily.   Interrogation of his defibrillator demonstrates a [employer] with R  waves of 10.  P waves of 3.  The impedence was 540 ohms in the atrium  and 0.75 V at 0.4 was the pacing threshold in the RV.   IMPRESSION:  1. Nonischemic cardiomyopathy.  2. Ventricular tachycardia.  3. Congestive heart failure presently class I.   DISCUSSION:  Mr. Amick's LV function has improved markedly with up  titration of his beta blocker.  I have recommended that he start  decreasing his dose of amiodarone from 200 mg daily to 200 mg daily  during the week and 100 mg on Saturday and Sunday.  We will plan to see  him back in several months and continue down titration of his amiodarone  as he tolerates it.     Doylene Canning. Ladona Ridgel, MD  Electronically Signed   GWT/MedQ  DD: 07/16/2006   DT: 07/17/2006  Job #: [id number]   cc:   Bethann Punches

## 2011-01-03 NOTE — Discharge Summary (Signed)
[name], [name]             ACCOUNT NO.:  [id number]   MEDICAL RECORD NO.:  [id number]          PATIENT TYPE:  INP   LOCATION:  3731                         FACILITY:  [facility]   PHYSICIAN:  Doylene Canning. Ladona Ridgel, M.D.  DATE OF BIRTH:  Apr 29, 1946   DATE OF ADMISSION:  09/17/2005  DATE OF DISCHARGE:  09/18/2005                                 DISCHARGE SUMMARY   ALLERGIES:  The patient has allergies to ACE INHIBITORS and NONSTEROIDAL  ANTI-INFLAMMATORY DRUGS.   PRINCIPLE DIAGNOSIS:  1.  Discharge day status post implant of St. Jude ATLAS II DD cardioverted      defibrillator.  2.  Class II congestive heart failure.  3.  Heavy burden of ventricular ectopy placed on amiodarone this admission.  4.  Ejection fraction of 25% of catheterization.  5.  Nonischemic cardiomyopathy.   SECONDARY DIAGNOSES:  1.  Hypertension.  2.  Dyslipidemia.  3.  Strong family history of coronary artery disease.   PROCEDURES:  September 17, 2005, implantation of DDD ICD, Dr. Lewayne Bunting.  This is a Biomedical scientist.  Defibrillator threshold study was performed less  than or equal to 15 joules with acceptable A/V pacing and sensing.   BRIEF HISTORY:  Mr. Strutz is a 66 year old male who has had some history  of some ventricular tachycardia.  He underwent electrophysiological study in  the past.  Ventricular tachycardia was not inducible.  The patient had  catheterization 2 years ago which showed ejection fraction of 25%.  Recently  he has had increasing dyspnea and was seen by [name].  He underwent  transesophageal echocardiogram.  Study showed severe left ventricular  dysfunction. Severe mitral regurgitation. He has a fairly heavy burden of  ventricular ectopy of unclear clinical significance.  The patient has never  had frank syncope.  Heart failure medications have improved the dyspnea.  He  also has moderate tricuspid regurgitation.  He was seen by Dr. Lewayne Bunting  and recommended implantable  cardioverter defibrillator for a known left  ventricular dysfunction and at least class II congestive heart failure  symptoms.  At the time of implantation, Dr. Ladona Ridgel planned on starting him  on amiodarone in helps of reducing the ventricular ectopic burden.   HOSPITAL COURSE:  The patient presented electively September 17, 2005.  He  underwent implantation of St. Jude Atlas II cardioverter defibrillator.  This is a dual chamber device.  The patient tolerated the procedure well.  Has had no post procedural complications.  Chest x-ray has been examined and  the leads are in appropriate position, both atrial and right ventricular  leads.  He has maintained sinus rhythm in the post procedure achieving 94%  oxygen saturation on room air.  His ICD pocket is without hematoma.  The  patient's pain is controlled well with oral analgesics of Tylenol.   LABORATORY STUDIES THIS ADMISSION:  Sodium 136, potassium 4.2, chloride 101,  carbonate 27, BUN of 16, creatinine 1.3, glucose 113.   The patient will present for follow up at the ICD clinic Monday, February 12  at 9:45.  He sees [name] February  9th.  He sees Dr. Ladona Ridgel in May and  Dr. Lubertha Basque office will call with that appointment.  He has blood work  follow up for the amiodarone therapy Monday, October 27, 2005.  TSH and a CMET  at 10:30 in the morning March 12.      Maple Mirza, P.A.    ______________________________  Doylene Canning. Ladona Ridgel, M.D.    GM/MEDQ  D:  09/18/2005  T:  09/18/2005  Job:  [id number]   cc:   Doylene Canning. Ladona Ridgel, M.D.  [address]  [address]  [place]  [place] [address]   Olga Millers, M.D. [facility]  [address]  [address]  [place]  [place] [address]   Bethann Punches  Fax: [phone]

## 2011-01-03 NOTE — Assessment & Plan Note (Signed)
[facility]                          CHRONIC HEART FAILURE NOTE   [name], [name]                      MRN:          [id number]  DATE:07/24/2006                            DOB:          [birth year]    PRIMARY CARDIOLOGIST:  Dr. Olga Millers.   PRIMARY CARE:  Dr. Bethann Punches.   EP:  Dr. Lewayne Bunting.   Mr. Isidro returns today for follow up regarding his history of  congestive heart failure secondary to nonischemic cardiomyopathy.  Mr.  Brigance states he has been doing quite well.  He recently saw Dr.  Ladona Ridgel for a 1 year follow up, at which time his amiodarone was  decreased from 1400 mg weekly to 1200 mg.  Mr. Doty has tolerated  decrease in medication without any problems.  He continues to tolerate  his other medications also without any problems.  Prior to his visit in  October he had experienced an episode of some vague dizziness,  lightheadedness on two occasions within the same 24 hour period.  He has  not had any further episodes of this and does not appear to be cardiac  related.  He does, however, continue to run mildly hypertensive with  blood pressure staying in the 140's/80's.  Previously, blood pressures  had maintained 130's/70's.  I had increased his Atacand to 32 mg back in  August.  Patient denies any episodes of chest discomfort, orthopnea,  PND, presyncope or syncopal episodes.  Continues to work full time with  [employer].  Overall appears to be very stable.   PAST MEDICAL HISTORY:  1. Congestive heart failure secondary to nonischemic cardiomyopathy.      Cardiac catheterization at [facility] in June of 2004 showed an      EF of 24% with normal coronaries.  An echocardiogram done on April 01, 2006, showed an ejection fraction of 50-55%.  2. History of nonsustained vtach.  Status post implantable      cardioverter defibrillator.  Followed by Dr. Lewayne Bunting.  3. History of  moderate to severe mitral regurgitation.  Most recent      echocardiogram showing mild MR.  4. Hypertension.  5. Hyperlipidemia.  6. History of allergies to ACE inhibitors and aspirin with development      of hives and angioedema.  7. Remote history of tobacco abuse but has not smoked in over 30      years.   REVIEW OF SYSTEMS:  As stated above, otherwise negative.   CURRENT MEDICATIONS:  1. Mag oxide 250 mg daily.  2. Meloxicam 15 mg daily.  3. Lipitor 10 mg daily.  4. Coreg 50 mg b.i.d.  5. Furosemide 20 mg daily.  6. Atacand 32 mg daily.  7. Amiodarone 200 mg Monday through Friday and 100 mg on Saturdays and      Sundays.   PHYSICAL EXAM:  Weight today 236 pounds, weight is up 1 pound from  November.  Blood pressure 146/82 with a heart rate of 62.  Mr. Conery  is in no acute distress, normocephalic, atraumatic.  NECK:  Supple.  No carotid bruits.  Jugular vein distention 6-7 cm at a  45 degree angle.  LUNGS:  Clear to auscultation throughout.  CARDIOVASCULAR EXAM:  Reveals a regular rate and rhythm, S1 and S2.  ABDOMEN:  Soft, nontender.  Positive bowel sounds.  LOWER EXTREMITIES:  Without clubbing, cyanosis or edema.  NEUROLOGIC:  Patient alert and oriented x3.  Cranial nerves II-XII  grossly intact.   IMPRESSION:  1. Class I heart failure secondary to nonischemic cardiomyopathy with      history of nonsustained ventricular tachycardia status post      placement of a St. Jude Atlas [phone] on September 17, 2005.  2. Mild hypertension.   PLAN:  1. Continue current medicines.  2. Patient has not seen [name] since early Fall.  I am going to      have him follow up with a 6 month appointment with him.  3. He saw Dr. Ladona Ridgel just recently, will see him again in 1 year,      sooner if he has any problems.  4. I will see Mr. Tagg back in 3 months, sooner if he has any      problems.  5. May consider adding low dose Norvasc for improved blood pressure      control.   I will leave this up to Dr. Ludwig Clarks discretion.      Dorian Pod, ACNP  Electronically Signed      Bevelyn Buckles. Bensimhon, MD  Electronically Signed   MB/MedQ  DD: 07/24/2006  DT: 07/24/2006  Job #: [id number]   cc:   Bethann Punches

## 2011-01-03 NOTE — Assessment & Plan Note (Signed)
[facility]                   COUMADIN / CHRONIC HEART FAILURE CLINIC NOTE   Alan, [name]                      MRN:          [id number]  DATE:03/05/2006                            DOB:          [birth year]    The patient is seen back in the heart failure clinic today for further  evaluation of medication titration of his heart failure secondary to  nonischemic cardiomyopathy.  [name] states he has been doing quite  well.  He does complain of a dry cough.  He notices it more in the evening  time and denies any sinus drainage, angioedema or rash.  He states it is a  nonproductive cough.  He states it is a nonproductive cough and it is more  of an irritant.  I recommend he follow up with Dr. Hyacinth [name] his primary care  physician regarding this.  Overall, [name] is tolerating his  medications without any problems.  He continues to work and maintain a  fairly active lifestyle.  He saw Dr. Jens [name] back in May.  Dr. Jens [name] was  concerned about the results of [name]'s echocardiogram which had shown  that the patient's mitral regurgitation appeared worse with ectopy/premature  ventricular contractions compared to sinus beats and recommended a followup  echocardiogram within the next 3 months.  Currently, [name] is on 37.5  mg of Coreg b.i.d. and tolerating this dose with plans to increase the dose  to 50 mg b.i.d.   PAST MEDICAL HISTORY:  1.  Congestive heart failure secondary to nonischemic cardiomyopathy.      Cardiac catheterization in June 2004, at [facility] showed an EF of      24% with normal coronary arteries.  No mention of mitral regurgitation      at that time.  Endomyocardial biopsy at that time was unrevealing.      Echocardiogram in November 2006, at [facility] showed an EF of 45% to      55% with severe mitral regurgitation.  Under this, transesophageal      echocardiogram in January 2007, showed an EF of  20-25% with severe      mitral regurgitation worse with premature ventricular contractions.  2.  History of ventricular tachycardia, status post implantable cardioverted      defibrillator.  3.  Severe mitral regurgitation as stated above.  4.  Hypertension.  5.  Hyperlipidemia.  6.  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  7.  Osteoarthritis.  8.  History of rheumatic fever.  9.  Remote history of tobacco use.   REVIEW OF SYSTEMS:  As stated above in history of present illness, otherwise  negative.   CURRENT MEDICATIONS:  1.  Magnesium oxide 250 mg daily.  2.  Potassium 200 mg b.i.d.  3.  Coreg 37.5 mg b.i.d.  4.  Furosemide 40 mg daily.  5.  Meloxicam 15 mg daily.  6.  Atacand 32 mg daily.  7.  Lipitor 10 mg daily.  8.  Amiodarone 200 mg daily.   PHYSICAL EXAMINATION:  VITAL SIGNS:  Weight 228 pounds, blood pressure  132/72 with a pulse of 76.  GENERAL:  The patient in no acute distress.  A very pleasant and cooperative  gentleman.  HEENT:  Pupils equal round and reactive to light.  Normocephalic,  atraumatic.  NECK:  Supple.  He has jugular venous distention of approximately 6-7 cmH20  with prominent CV waves.  CARDIAC:  Regular rate and rhythm with an occasional ectopic beat.  He does  have a 2/6 systolic ejection murmur.  LUNGS:  Clear.  ABDOMEN:  Soft, nontender, positive bowel sounds.  EXTREMITIES:  Without clubbing, cyanosis or edema.  NEUROLOGIC:  The patient is alert and oriented x3.  Cranial nerves 2-12  grossly intact.   ASSESSMENT/IMPRESSION:  Congestive heart failure secondary to nonischemic  cardiomyopathy.  Currently, at a class I to II.  I have asked the patient to  go ahead and increase his Coreg to 50 mg b.i.d.  He is currently on 37.5.  The patient to call if he has any problem with the dose.  The patient has  not had blood work drawn since May.  I will go ahead and check a chemistry  on him today.  I will have the patient increase the  Coreg beginning today  and go ahead with plans to proceed with 2-D echocardiogram in August to re-  evaluate ejection fraction and mitral regurgitation.                                 Dorian Pod, ACNP    MB/MedQ  DD:  03/05/2006  DT:  03/06/2006  Job #:  [id number]

## 2011-01-03 NOTE — Discharge Summary (Signed)
[name], [name] NO.:  [id number]   MEDICAL RECORD NO.:  [id number]          PATIENT TYPE:  INP   LOCATION:  3731                         FACILITY:  [facility]   PHYSICIAN:  Maple Mirza, P.A. DATE OF BIRTH:  1946/07/18   DATE OF ADMISSION:  09/17/2005  DATE OF DISCHARGE:  09/18/2005                                 DISCHARGE SUMMARY   ADDENDUM:  This addendum covers an appointment that was made for [name] to see Dr. Ladona Ridgel Wednesday, November 26, 2005 at 11:30 in the morning  to set up an appointment for defibrillator threshold study.  Reason:  As  amiodarone loads, defibrillator threshold changes for his ICD.      Maple Mirza, P.A.     GM/MEDQ  D:  09/18/2005  T:  09/18/2005  Job:  [id number]   cc:   Bethann Punches  Fax: [phone]

## 2011-01-03 NOTE — Assessment & Plan Note (Signed)
[facility]                   COUMADIN / CHRONIC HEART FAILURE CLINIC NOTE   TIMATHY, NEWBERRY                      MRN:          [id number]  DATE:06/11/2006                            DOB:          11-25-1945    Mr. 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  Mr.  Baltimore states he has been doing real well, tolerating medications without  any problems.  Denies any episodes of orthopnea, PND, peripheral edema.  No  palpitations or firing from his defibrillator.  He had x1 episode in  September when he experienced some mild lightheadedness and dizziness that  lasted approximately 1 minute or so while the patient was in car as  passenger.  He states he has taken his medications approximately 1 hour  prior to this episode.  He did not become diaphoretic or clammy, did not  pass out, did not experience any firing or palpitations and the episode  resolved without any further return.  Other than this, he states he has been  doing quite well.   PAST MEDICAL HISTORY:  1. Congestive heart failure secondary to nonischemic cardiomyopathy.      Cardiac catheterization at [facility] in June 2004 showed an EF of 24% with      normal coronaries.  Repeat echocardiogram done April 01, 2006, showing      a left ventricular ejection fraction with great improvement, between 50-      55%.  2. 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  3. History of moderate to severe mitral regurgitation.  Most recent      echocardiogram showing mild mitral valve regurgitation.  4. Hypertension.  5. Hyperlipidemia.  6. Questionable history of allergies to ACE INHIBITORS and ASPIRIN.  7. Osteoarthritis.  8. Remote history of rheumatic fever.   REVIEW OF SYSTEMS:  As stated above in history of present illness; otherwise   negative.   CURRENT MEDICATIONS:  1. Magnesium oxide 250 mg daily.  2. Coreg 50 mg p.o. b.i.d.  3. Meloxicam 15 mg daily.  4. Lipitor 10 mg daily.  5. Amiodarone 200 mg daily.  6. Furosemide 20 mg daily.  7. Atacand 32 mg daily.   PHYSICAL EXAMINATION:  VITAL SIGNS:  Weight 232 pounds; weight has been  stable for the last two visits.  Blood pressure initially 157/80, repeat  150/84 with a pulse of 82.  GENERAL:  Mr. Morrical is in no acute distress.  No jugular vein distension  at 45-degree angle.  LUNGS:  Clear to auscultation bilaterally.  CARDIOVASCULAR:  Reveals a regular rate and rhythm, S1 and S2.  ABDOMEN:  Soft, nontender, positive bowel sounds.  LOWER EXTREMITIES:  Without clubbing, cyanosis, or edema.  NEUROLOGIC:  The patient alert and oriented x3.  Cranial nerves II-XII  grossly intact.   IMPRESSION:  Stable class I to class II heart failure.  Continue current  medications.  The patient is slightly hypertensive today on maximum dose of  Atacand, Coreg.  Will continue current medications and have the patient  follow up  with [name] regarding recent lightheadedness/dizziness  episode.  Also, the patient has not seen Dr. Ladona Ridgel since April.  It is time  for his 29-month followup.  Will go ahead and have the patient schedule an  appointment with Dr. Ladona Ridgel for evaluation of his device.     ______________________________  Dorian Pod, ACNP    ______________________________  Bevelyn Buckles. Bensimhon, MD   MB/MedQ  DD: 06/11/2006  DT: 06/12/2006  Job #: [id number]

## 2011-01-03 NOTE — Assessment & Plan Note (Signed)
[facility]                          CHRONIC HEART FAILURE NOTE   [name], [name]                      MRN:          [id number]  DATE:10/27/2006                            DOB:          1946-04-09    PRIMARY CARDIOLOGIST:  Dr. Olga Millers.   PRIMARY CARE PHYSICIAN:  Dr. Bethann Punches.  EP is Dr. Sharrell Ku.   Mr. Sedore returns today for further evaluation and followup regarding  his congestive heart failure, which is secondary to nonischemic  cardiomyopathy.  Mr. Bevins states that he has been doing quite well.  He recently saw [name] on his routine cardiology visit, at which  time he checked blood work and chest x-ray with a request to repeat his  chest x-ray today secondary to increased respiratory motion.  Blood work  was also obtained.  Liver function tests within normal limits.  BUN and  creatinine 23 and 1.4, TSH 1.96.  Mr. Chabot denies any problems.  He  states he can tolerate medication without difficulty.  Denying any pre-  syncope, syncope, orthopnea, PND, palpitations.  [name] has also  been slowly titrating Mr. Mehring's amiodarone down with plans to  hopefully eventually discontinue it completely.   PAST MEDICAL HISTORY:  1. Includes congestive heart failure secondary to nonischemic      cardiomyopathy.  Cardiac catheterization at [facility] in June      of 2004 showed an EF of 24% with normal coronaries.  Echocardiogram      repeated in August of 2007 showed an EF of 50-55%.  2. History of nonsustained ventricular tachycardia status post      implantable cardioverter defibrillator followed by Dr. Ladona Ridgel.  3. History of moderate to severe mitral regurgitation.  4. Hypertension.  5. Hyperlipidemia.  6. History of allergies to ACE inhibitors and aspirin with development      of hives and angioedema.  7. Remote history of tobacco abuse, but has not smoked in over 30      years.  8. Amiodarone taper in  progress.   REVIEW OF SYSTEMS:  As stated above.   ALLERGIES:  Include NSAID, ACE INHIBITORS, and ASPIRIN.   CURRENT MEDICATIONS:  1. Mag oxide daily.  2. Meloxicam 15 mg daily.  3. Lipitor 10 mg daily.  4. Coreg 50 mg b.i.d.  5. Furosemide 10 mg daily.  6. Atacand 32 mg daily.  7. Norvasc 5 mg daily.  8. Amiodarone 100 mg on Monday through Thursday, 200 mg on Friday,      Saturday, and Sunday.   PHYSICAL EXAM:  Weight 235 pounds, blood pressure 131/75, pulse 57.  Mr. Arnesen is in no acute distress.  Very pleasant gentleman.  No  jugular venous distension noted at 45 degree angle.  LUNGS:  Clear to auscultation bilaterally.  CARDIOVASCULAR:  Reveals an S1, S2 with regular rate and rhythm.  ABDOMEN:  Soft and nontender.  Positive bowel sounds.  LOWER EXTREMITIES:  Without cyanosis, clubbing, or edema.   IMPRESSION:  Stable class I New [employer] Heart Association failure at this  time.  Continue current medications.  Dr.  Crenshaw with plans to repeat  Holter monitor with amiodarone taper.  Apparently, this will be done  today.  Results to be sent to [name].  Otherwise, continue current  treatment.  Chest x-ray repeated pending final cardiology report.      Dorian Pod, ACNP  Electronically Signed      Rollene Rotunda, MD, [facility]  Electronically Signed   MB/MedQ  DD: 10/27/2006  DT: 10/29/2006  Job #: [id number]   cc:   Bethann Punches

## 2011-01-14 ENCOUNTER — Other Ambulatory Visit: Payer: Self-pay | Admitting: *Deleted

## 2011-01-14 MED ORDER — POTASSIUM CHLORIDE CRYS ER 20 MEQ PO TBCR: 20.0000 meq | EXTENDED_RELEASE_TABLET | ORAL | Status: DC

## 2011-01-14 NOTE — Telephone Encounter (Signed)
rx sent in today

## 2011-01-20 ENCOUNTER — Telehealth: Payer: Self-pay | Admitting: Cardiology

## 2011-01-20 NOTE — Telephone Encounter (Signed)
Spoke with pharm, dosage of potassium given Deliah Goody

## 2011-01-20 NOTE — Telephone Encounter (Signed)
pls clarify direction / dosage of potassium .

## 2011-01-21 ENCOUNTER — Telehealth: Payer: Self-pay

## 2011-01-21 MED ORDER — POTASSIUM CHLORIDE CRYS ER 20 MEQ PO TBCR: EXTENDED_RELEASE_TABLET | ORAL | Status: DC

## 2011-01-21 NOTE — Telephone Encounter (Signed)
Notified patient of correct dose and instructions of potassium.  The patient is only taking the potassium 20 meq 1/2 (half) tablet daily.

## 2011-01-23 ENCOUNTER — Encounter: Payer: Self-pay | Admitting: Internal Medicine

## 2011-01-23 ENCOUNTER — Ambulatory Visit (INDEPENDENT_AMBULATORY_CARE_PROVIDER_SITE_OTHER): Payer: BC Managed Care – PPO | Admitting: Internal Medicine

## 2011-01-23 DIAGNOSIS — I495 Sick sinus syndrome: Secondary | ICD-10-CM

## 2011-01-23 DIAGNOSIS — I10 Essential (primary) hypertension: Secondary | ICD-10-CM

## 2011-01-23 DIAGNOSIS — I4949 Other premature depolarization: Secondary | ICD-10-CM

## 2011-01-23 DIAGNOSIS — I428 Other cardiomyopathies: Secondary | ICD-10-CM

## 2011-01-23 DIAGNOSIS — Z95810 Presence of automatic (implantable) cardiac defibrillator: Secondary | ICD-10-CM

## 2011-01-23 MED ORDER — CARVEDILOL 25 MG PO TABS: 50.0000 mg | ORAL_TABLET | ORAL | Status: DC

## 2011-01-23 MED ORDER — CARVEDILOL 25 MG PO TABS: 25.0000 mg | ORAL_TABLET | Freq: Two times a day (BID) | ORAL | Status: DC

## 2011-01-23 NOTE — Assessment & Plan Note (Signed)
He is asymptomatic. The density of ventricular ectopy is improved.he will continue his current medications.

## 2011-01-23 NOTE — Assessment & Plan Note (Signed)
His device is working normally. He is approaching elective replacement. We'll recheck in several months.

## 2011-01-23 NOTE — Progress Notes (Signed)
HPI [name] returns today for followup. He is a pleasant, 65 year old man, with a history of nonsustained symptomatic ventricular tachycardia and a nonischemic cardiomyopathy. He is a strong family history of sudden cardiac death. Recently, the patient's left ventricular function has improved. He has had no ICD shocks. He denies chest pain or shortness of breath. He underwent knee replacement surgery over a year ago and this is doing well. He has been exercising on a bike daily. He lost weight and is feeling better Allergies  Allergen Reactions  . Ace Inhibitors   . Nsaids      Current Outpatient Prescriptions  Medication Sig Dispense Refill  . ALPRAZolam (XANAX) 0.25 MG tablet Take 0.25 mg by mouth at bedtime as needed.        Marland Kitchen amiodarone (PACERONE) 200 MG tablet 1 tab po qd mon-thur......100mg  fri sat and sunday       . amLODipine (NORVASC) 5 MG tablet Take 1 tablet (5 mg total) by mouth daily.  90 tablet  4  . atorvastatin (LIPITOR) 40 MG tablet Take 40 mg by mouth daily.        . carvedilol (COREG) 25 MG tablet Take 1 tablet (25 mg total) by mouth 2 (two) times daily with a meal. 2 tabs po bid  60 tablet  6  . Fluticasone-Salmeterol (ADVAIR DISKUS) 250-50 MCG/DOSE AEPB Inhale 1 puff into the lungs as needed.        . furosemide (LASIX) 20 MG tablet Take 20 mg by mouth 2 (two) times daily.        . magnesium gluconate (MAGONATE) 500 MG tablet 1 tab po qd       . potassium chloride SA (KLOR-CON M20) 20 MEQ tablet Take 1/2 (half) tablet daily  45 tablet  3  . DISCONTD: carvedilol (COREG) 25 MG tablet 2 tabs po bid      . SINGULAIR 10 MG tablet 1 tablet at bedtime.         Past Medical History  Diagnosis Date  . CHF (congestive heart failure)   . Cardiomyopathy   . PVC (premature ventricular contraction)   . Hyperlipidemia   . HTN (hypertension)   . Osteoporosis   . Abnormal chest CT     ROS:   All systems reviewed and negative except as noted in the HPI.   Past  Surgical History  Procedure Date  . Knee arthroplasty     Total left knee 2006  . Knee arthroplasty     Total right knee x2  . Implantation of dual-chamber icd.   . Replacement total knee     right     Family History  Problem Relation Age of Onset  . Lung cancer    . Heart attack       History   Social History  . Marital Status: Married    Spouse Name: N/A    Number of Children: N/A  . Years of Education: N/A   Occupational History  . Not on file.   Social History Main Topics  . Smoking status: Former Smoker -- 1.0 packs/day for 10 years    Types: Cigarettes    Quit date: 12/31/1970  . Smokeless tobacco: Never Used  . Alcohol Use: Yes  . Drug Use: No  . Sexually Active: Not on file   Other Topics Concern  . Not on file   Social History Narrative  . No narrative on file     BP 147/72  Pulse 60  Ht  5\' 9"  (1.753 m)  Wt 227 lb 12.8 oz (103.329 kg)  BMI 33.64 kg/m2  Physical Exam:  Well appearing NAD HEENT: Unremarkable Neck:  No JVD, no thyromegally Lymphatics:  No adenopathy Back:  No CVA tenderness Lungs:  Clear HEART:  Regular rate rhythm, no murmurs, no rubs, no clicks Abd:  Obese, positive bowel sounds, no organomegally, no rebound, no guarding Ext:  2 plus pulses, no edema, no cyanosis, no clubbing Skin:  No rashes no nodules Neuro:  CN II through XII intact, motor grossly intact  DEVICE  Normal device function.  See PaceArt for details.   Assess/Plan:

## 2011-01-23 NOTE — Assessment & Plan Note (Signed)
His blood pressure is slightly elevated today. He notes that at home is much better controlled. I have asked that he continue his current medications

## 2011-01-23 NOTE — Patient Instructions (Addendum)
Your physician recommends that you schedule a follow-up appointment in: Send in remote transmission on 04/24/11 Your physician recommends that you schedule a follow-up appointment in: 1 year with Dr. Ladona Ridgel.

## 2011-04-24 ENCOUNTER — Encounter: Payer: BC Managed Care – PPO | Admitting: *Deleted

## 2011-05-01 ENCOUNTER — Encounter: Payer: Self-pay | Admitting: *Deleted

## 2011-05-02 ENCOUNTER — Telehealth: Payer: Self-pay | Admitting: *Deleted

## 2011-05-02 DIAGNOSIS — T82198A Other mechanical complication of other cardiac electronic device, initial encounter: Secondary | ICD-10-CM

## 2011-05-02 NOTE — Telephone Encounter (Signed)
Checking lead 

## 2011-05-05 ENCOUNTER — Other Ambulatory Visit: Payer: Self-pay | Admitting: Internal Medicine

## 2011-05-05 ENCOUNTER — Encounter: Payer: Self-pay | Admitting: Internal Medicine

## 2011-05-05 ENCOUNTER — Ambulatory Visit (INDEPENDENT_AMBULATORY_CARE_PROVIDER_SITE_OTHER): Payer: BC Managed Care – PPO | Admitting: *Deleted

## 2011-05-05 DIAGNOSIS — I428 Other cardiomyopathies: Secondary | ICD-10-CM

## 2011-05-07 ENCOUNTER — Ambulatory Visit (INDEPENDENT_AMBULATORY_CARE_PROVIDER_SITE_OTHER)
Admission: RE | Admit: 2011-05-07 | Discharge: 2011-05-07 | Disposition: A | Discharge status: Home / Self Care | Payer: BC Managed Care – PPO | Source: Ambulatory Visit | Attending: Internal Medicine | Admitting: Internal Medicine

## 2011-05-07 DIAGNOSIS — T82198A Other mechanical complication of other cardiac electronic device, initial encounter: Secondary | ICD-10-CM

## 2011-05-11 LAB — REMOTE ICD DEVICE
AL AMPLITUDE: 3 mv
BAMS-0001: 170 {beats}/min
BAMS-0003: 60 {beats}/min
DEVICE MODEL ICD: 340448
RV LEAD AMPLITUDE: 9.2 mv
TZAT-0004FASTVT: 8
TZAT-0013FASTVT: 4
TZAT-0018FASTVT: NEGATIVE
TZON-0003FASTVT: 330 ms
TZON-0004SLOWVT: 30
TZON-0005SLOWVT: 6
TZON-0010SLOWVT: 80 ms
TZST-0001FASTVT: 2
TZST-0001FASTVT: 3
TZST-0001FASTVT: 5
TZST-0001SLOWVT: 1
TZST-0002SLOWVT: NEGATIVE
TZST-0003FASTVT: 36 J
TZST-0003FASTVT: 36 J

## 2011-05-16 NOTE — Progress Notes (Signed)
ICD checked by remote. 

## 2011-05-19 ENCOUNTER — Telehealth: Payer: Self-pay | Admitting: Cardiology

## 2011-05-19 ENCOUNTER — Telehealth: Payer: Self-pay | Admitting: *Deleted

## 2011-05-19 NOTE — Telephone Encounter (Signed)
done

## 2011-05-19 NOTE — Telephone Encounter (Signed)
Walk in pt Form " Pt needs Results of Xray" sent to [name]   05/19/11/km

## 2011-05-21 ENCOUNTER — Encounter: Payer: Self-pay | Admitting: *Deleted

## 2011-07-01 ENCOUNTER — Encounter: Payer: Self-pay | Admitting: Cardiology

## 2011-07-01 ENCOUNTER — Ambulatory Visit (INDEPENDENT_AMBULATORY_CARE_PROVIDER_SITE_OTHER): Payer: BC Managed Care – PPO | Admitting: Cardiology

## 2011-07-01 DIAGNOSIS — I10 Essential (primary) hypertension: Secondary | ICD-10-CM

## 2011-07-01 DIAGNOSIS — I493 Ventricular premature depolarization: Secondary | ICD-10-CM

## 2011-07-01 DIAGNOSIS — I4949 Other premature depolarization: Secondary | ICD-10-CM

## 2011-07-01 DIAGNOSIS — I509 Heart failure, unspecified: Secondary | ICD-10-CM

## 2011-07-01 DIAGNOSIS — E785 Hyperlipidemia, unspecified: Secondary | ICD-10-CM

## 2011-07-01 LAB — BASIC METABOLIC PANEL
Chloride: 104 mEq/L (ref 96–112)
GFR: 49.35 mL/min — ABNORMAL LOW (ref >60.00)
Potassium: 4 mEq/L (ref 3.5–5.1)
Sodium: 140 mEq/L (ref 135–145)

## 2011-07-01 LAB — HEPATIC FUNCTION PANEL
ALT: 15 U/L (ref 0–53)
Albumin: 3.7 g/dL (ref 3.5–5.2)
Total Protein: 7.6 g/dL (ref 6.0–8.3)

## 2011-07-01 MED ORDER — FUROSEMIDE 20 MG PO TABS: 20.0000 mg | ORAL_TABLET | Freq: Every day | ORAL | Status: DC

## 2011-07-01 MED ORDER — ATORVASTATIN CALCIUM 40 MG PO TABS: 20.0000 mg | ORAL_TABLET | Freq: Every day | ORAL | Status: DC

## 2011-07-01 MED ORDER — CARVEDILOL 25 MG PO TABS: 50.0000 mg | ORAL_TABLET | ORAL | Status: DC

## 2011-07-01 MED ORDER — AMIODARONE HCL 200 MG PO TABS: ORAL_TABLET | ORAL | Status: DC

## 2011-07-01 NOTE — Assessment & Plan Note (Signed)
Patient with occasional PND. He will take an additional 20 mg of Lasix daily as needed. Repeat echocardiogram.

## 2011-07-01 NOTE — Assessment & Plan Note (Signed)
Continue statin. 

## 2011-07-01 NOTE — Progress Notes (Signed)
HPI: [name] is a pleasant gentleman, who has a history of nonischemic cardiomyopathy improved by most recent echocardiogram.  His last echo on November 07, 2008, showed an ejection fraction of 50%.  There was mild aortic insufficiency and mitral regurgitation.  There was moderate left atrial enlargement and mild tricuspid regurgitation.  He also has had a previous ICD.  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 I last saw him in May of 2012. Since then the patient has dyspnea with more extreme activities but not with routine activities. It is relieved with rest. It is not associated with chest pain. There is no  PND or pedal edema. There is no syncope or palpitations. There is no exertional chest pain. Occasional PND.   Current Outpatient Prescriptions  Medication Sig Dispense Refill  . ALPRAZolam (XANAX) 0.25 MG tablet Take 0.25 mg by mouth at bedtime as needed.        Marland Kitchen amiodarone (PACERONE) 200 MG tablet 1 tab po qd mon-thur......100mg  fri sat and sunday       . amLODipine (NORVASC) 5 MG tablet Take 1 tablet (5 mg total) by mouth daily.  90 tablet  4  . atorvastatin (LIPITOR) 40 MG tablet Take 40 mg by mouth daily.        . carvedilol (COREG) 25 MG tablet Take 2 tablets (50 mg total) by mouth as directed. 2 tabs po bid  120 tablet  6  . Fluticasone-Salmeterol (ADVAIR DISKUS) 250-50 MCG/DOSE AEPB Inhale 1 puff into the lungs as needed.        . furosemide (LASIX) 20 MG tablet Take 20 mg by mouth 2 (two) times daily.        . magnesium gluconate (MAGONATE) 500 MG tablet 1 tab po qd       . potassium chloride SA (KLOR-CON M20) 20 MEQ tablet Take 1/2 (half) tablet daily  45 tablet  3  . SINGULAIR 10 MG tablet 1 tablet at bedtime.         Past Medical History  Diagnosis Date  . CHF (congestive heart failure)   . Cardiomyopathy   . PVC (premature ventricular contraction)   . Hyperlipidemia   . HTN (hypertension)     . Osteoporosis   . Abnormal chest CT     Past Surgical History  Procedure Date  . Knee arthroplasty     Total left knee 2006  . Knee arthroplasty     Total right knee x2  . Implantation of dual-chamber icd.   . Replacement total knee     right    History   Social History  . Marital Status: Married    Spouse Name: N/A    Number of Children: N/A  . Years of Education: N/A   Occupational History  . Not on file.   Social History Main Topics  . Smoking status: Former Smoker -- 1.0 packs/day for 10 years    Types: Cigarettes    Quit date: 12/31/1970  . Smokeless tobacco: Never Used  . Alcohol Use: Yes  . Drug Use: No  . Sexually Active: Not on file   Other Topics Concern  . Not on file   Social History Narrative  . No narrative on file    ROS: no fevers or chills, productive cough, hemoptysis, dysphasia, odynophagia, melena, hematochezia, dysuria, hematuria, rash, seizure activity, orthopnea,  pedal edema, claudication. Remaining systems are negative.  Physical Exam: Well-developed well-nourished  in no acute distress.  Skin is warm and dry.  HEENT is normal.  Neck is supple. No thyromegaly.  Chest is clear to auscultation with normal expansion.  Cardiovascular exam is regular rate and rhythm.  Abdominal exam nontender or distended. No masses palpated. Extremities show no edema. neuro grossly intact  ECG Sinus rhythm at a rate of 62. Nonspecific ST changes. First degree block.

## 2011-07-01 NOTE — Assessment & Plan Note (Signed)
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 LV function improved on most recent echocardiogram. Continue amiodarone. Check TSH, liver functions and renal function. Recent chest x-ray negative.

## 2011-07-01 NOTE — Assessment & Plan Note (Signed)
Management per electrophysiology. 

## 2011-07-01 NOTE — Patient Instructions (Signed)
Your physician wants you to follow-up in: 6 months.   You will receive a reminder letter in the mail two months in advance. If you don't receive a letter, please call our office to schedule the follow-up appointment.  Your physician has requested that you have an echocardiogram. Echocardiography is a painless test that uses sound waves to create images of your heart. It provides your doctor with information about the size and shape of your heart and how well your heart's chambers and valves are working. This procedure takes approximately one hour. There are no restrictions for this procedure.  Your physician recommends that you return for lab work in: today (Liver, TSH, BMET)  Your physician has recommended you make the following change in your medication: Take an extra 20mg  of Lasix daily as needed

## 2011-07-01 NOTE — Assessment & Plan Note (Signed)
Blood pressure controlled. Continue present medications. 

## 2011-07-08 ENCOUNTER — Ambulatory Visit (HOSPITAL_COMMUNITY): Payer: BC Managed Care – PPO | Attending: Cardiovascular Disease | Admitting: Radiology

## 2011-07-08 ENCOUNTER — Telehealth: Payer: Self-pay | Admitting: Cardiology

## 2011-07-08 DIAGNOSIS — E785 Hyperlipidemia, unspecified: Secondary | ICD-10-CM | POA: Insufficient documentation

## 2011-07-08 DIAGNOSIS — I059 Rheumatic mitral valve disease, unspecified: Secondary | ICD-10-CM | POA: Insufficient documentation

## 2011-07-08 DIAGNOSIS — I10 Essential (primary) hypertension: Secondary | ICD-10-CM | POA: Insufficient documentation

## 2011-07-08 DIAGNOSIS — E669 Obesity, unspecified: Secondary | ICD-10-CM | POA: Insufficient documentation

## 2011-07-08 DIAGNOSIS — I079 Rheumatic tricuspid valve disease, unspecified: Secondary | ICD-10-CM | POA: Insufficient documentation

## 2011-07-08 DIAGNOSIS — I428 Other cardiomyopathies: Secondary | ICD-10-CM | POA: Insufficient documentation

## 2011-07-08 DIAGNOSIS — I359 Nonrheumatic aortic valve disorder, unspecified: Secondary | ICD-10-CM | POA: Insufficient documentation

## 2011-07-08 NOTE — Telephone Encounter (Signed)
SEE ECHO REPORT. PT AWARE  OF THE NEED FOR F/U APPT SEE APPTS./CY

## 2011-07-08 NOTE — Telephone Encounter (Signed)
FU Call: Pt returning call to [facility]. Please call back.

## 2011-07-10 ENCOUNTER — Emergency Department: Payer: Self-pay | Admitting: Emergency Medicine

## 2011-07-29 ENCOUNTER — Encounter: Payer: Self-pay | Admitting: Cardiology

## 2011-07-29 ENCOUNTER — Ambulatory Visit (INDEPENDENT_AMBULATORY_CARE_PROVIDER_SITE_OTHER): Payer: BC Managed Care – PPO | Admitting: Cardiology

## 2011-07-29 VITALS — BP 120/80 | HR 68 | Ht 69.0 in | Wt 227.0 lb

## 2011-07-29 DIAGNOSIS — E785 Hyperlipidemia, unspecified: Secondary | ICD-10-CM

## 2011-07-29 DIAGNOSIS — I10 Essential (primary) hypertension: Secondary | ICD-10-CM

## 2011-07-29 DIAGNOSIS — Z95810 Presence of automatic (implantable) cardiac defibrillator: Secondary | ICD-10-CM

## 2011-07-29 DIAGNOSIS — I493 Ventricular premature depolarization: Secondary | ICD-10-CM

## 2011-07-29 DIAGNOSIS — I428 Other cardiomyopathies: Secondary | ICD-10-CM

## 2011-07-29 DIAGNOSIS — I4949 Other premature depolarization: Secondary | ICD-10-CM

## 2011-07-29 MED ORDER — ISOSORBIDE MONONITRATE ER 30 MG PO TB24: 30.0000 mg | ORAL_TABLET | Freq: Every day | ORAL | Status: DC

## 2011-07-29 MED ORDER — HYDRALAZINE HCL 10 MG PO TABS: 10.0000 mg | ORAL_TABLET | Freq: Three times a day (TID) | ORAL | Status: DC

## 2011-07-29 NOTE — Progress Notes (Signed)
HPI:[name] is a pleasant gentleman, who has a history of nonischemic cardiomyopathy improved on fu echocardiogram. He also has had a previous ICD. Note, his cardiomyopathy was felt possibly secondary to ventricular ectopy in the past and he is on amiodarone for that. I last saw him in Nov 2012 and he was describing orthopnea and dyspnea. Fu echo in Nov 2012 showed EF 30-35, mild MR, trace AI, moderate LAE, moderate to severe RAE, mild RVE. TSH normal. Since I last saw him, he apparently was seen in the [facility] ER with the sensation of dyspnea. He then developed nausea and vomiting. He now believes he was not severely dyspneic. He denies dyspnea on exertion, orthopnea, PND, pedal edema, chest pain, palpitations or syncope. He has had ? angioedema with ACE inhibitors and ARB's in the past.   Current Outpatient Prescriptions  Medication Sig Dispense Refill  . ALPRAZolam (XANAX) 0.25 MG tablet Take 0.25 mg by mouth at bedtime as needed.        Marland Kitchen amiodarone (PACERONE) 200 MG tablet 1 tab (200mg ) po qd mon-thur...... One-half tab (100mg ) fri sat and sunday  30 tablet  11  . amLODipine (NORVASC) 5 MG tablet Take 1 tablet (5 mg total) by mouth daily.  90 tablet  4  . atorvastatin (LIPITOR) 40 MG tablet Take 0.5 tablets (20 mg total) by mouth daily.  15 tablet  11  . carvedilol (COREG) 25 MG tablet 2 tabs po bid       . EPIPEN 2-PAK 0.3 MG/0.3ML DEVI as needed.      . Fluticasone-Salmeterol (ADVAIR DISKUS) 250-50 MCG/DOSE AEPB Inhale 1 puff into the lungs as needed.        . furosemide (LASIX) 20 MG tablet Take 1 tablet (20 mg total) by mouth daily. Take an extra 20mg  daily as needed  30 tablet  11  . magnesium gluconate (MAGONATE) 500 MG tablet 1 tab po qd       . potassium chloride SA (KLOR-CON M20) 20 MEQ tablet Take 1/2 (half) tablet daily  45 tablet  3  . SINGULAIR 10 MG tablet 1 tablet at bedtime.      . hydrALAZINE (APRESOLINE) 10 MG tablet Take 1 tablet (10 mg total) by mouth 3 (three) times  daily.  270 tablet  4  . isosorbide mononitrate (IMDUR) 30 MG 24 hr tablet Take 1 tablet (30 mg total) by mouth daily.  90 tablet  4     Past Medical History  Diagnosis Date  . CHF (congestive heart failure)   . Cardiomyopathy   . PVC (premature ventricular contraction)   . Hyperlipidemia   . HTN (hypertension)   . Osteoporosis   . Abnormal chest CT     Past Surgical History  Procedure Date  . Knee arthroplasty     Total left knee 2006  . Knee arthroplasty     Total right knee x2  . Implantation of dual-chamber icd.   . Replacement total knee     right    History   Social History  . Marital Status: Married    Spouse Name: N/A    Number of Children: N/A  . Years of Education: N/A   Occupational History  . Not on file.   Social History Main Topics  . Smoking status: Former Smoker -- 1.0 packs/day for 10 years    Types: Cigarettes    Quit date: 12/31/1970  . Smokeless tobacco: Never Used  . Alcohol Use: Yes  . Drug Use: No  .  Sexually Active: Not on file   Other Topics Concern  . Not on file   Social History Narrative  . No narrative on file    ROS: no fevers or chills, productive cough, hemoptysis, dysphasia, odynophagia, melena, hematochezia, dysuria, hematuria, rash, seizure activity, orthopnea, PND, pedal edema, claudication. Remaining systems are negative.  Physical Exam: Well-developed well-nourished in no acute distress.  Skin is warm and dry.  HEENT is normal.  Neck is supple. No thyromegaly.  Chest is clear to auscultation with normal expansion.  Cardiovascular exam is regular rate and rhythm.  Abdominal exam nontender or distended. No masses palpated. Extremities show no edema. neuro grossly intact

## 2011-07-29 NOTE — Assessment & Plan Note (Signed)
Blood pressure controlled. Continue present medications. 

## 2011-07-29 NOTE — Patient Instructions (Signed)
Your physician recommends that you schedule a follow-up appointment in: 3 MONTHS  START HYDRALAZINE 10 MG ONE TABLET THREE TIMES DAILY  START ISOSORBIDE 30 MG ONCE DAILY  Your physician has recommended that you wear a holter monitor. Holter monitors are medical devices that record the heart's electrical activity. Doctors most often use these monitors to diagnose arrhythmias. Arrhythmias are problems with the speed or rhythm of the heartbeat. The monitor is a small, portable device. You can wear one while you do your normal daily activities. This is usually used to diagnose what is causing palpitations/syncope (passing out).48 HOUR

## 2011-07-29 NOTE — Assessment & Plan Note (Signed)
Repeat 48 hour Holter monitor.

## 2011-07-29 NOTE — Assessment & Plan Note (Signed)
Patient's followup echocardiogram shows that his LV function is worse compared to previous. His previous cardiomyopathy was felt possibly to be related to ventricular ectopy. Continue amiodarone. Check 48 hour Holter monitor to quantify the number of PVCs. Will increase amiodarone if necessary. Continue beta blocker. He has had angioedema with ACE inhibitors. Add hydralazine 10 mg t.i.d. And Imdur 30 mg daily. Titrate medications and repeat echocardiogram in 6 months. Note ICD already in place.

## 2011-07-29 NOTE — Assessment & Plan Note (Signed)
Management per primary care. 

## 2011-07-29 NOTE — Assessment & Plan Note (Signed)
Management per electrophysiology. 

## 2011-07-30 ENCOUNTER — Encounter: Payer: BC Managed Care – PPO | Admitting: *Deleted

## 2011-07-30 DIAGNOSIS — I493 Ventricular premature depolarization: Secondary | ICD-10-CM

## 2011-08-07 ENCOUNTER — Encounter: Payer: BC Managed Care – PPO | Admitting: *Deleted

## 2011-08-11 ENCOUNTER — Encounter: Payer: Self-pay | Admitting: *Deleted

## 2011-08-15 ENCOUNTER — Telehealth: Payer: Self-pay | Admitting: *Deleted

## 2011-08-15 NOTE — Telephone Encounter (Signed)
Spoke with pt, aware holter monitor reviewed by dr Jens Som shows sinus with freq PVC's and NSVT. Per dr Jens Som pt instructed to increase amiodarone to 200 mg once daily and follow up appt made for pt to see dr Ladona Ridgel

## 2011-09-09 ENCOUNTER — Encounter: Payer: Self-pay | Admitting: Internal Medicine

## 2011-09-09 ENCOUNTER — Ambulatory Visit (INDEPENDENT_AMBULATORY_CARE_PROVIDER_SITE_OTHER): Payer: BC Managed Care – PPO | Admitting: Internal Medicine

## 2011-09-09 DIAGNOSIS — I428 Other cardiomyopathies: Secondary | ICD-10-CM

## 2011-09-09 DIAGNOSIS — I5023 Acute on chronic systolic (congestive) heart failure: Secondary | ICD-10-CM | POA: Insufficient documentation

## 2011-09-09 DIAGNOSIS — Z95810 Presence of automatic (implantable) cardiac defibrillator: Secondary | ICD-10-CM

## 2011-09-09 DIAGNOSIS — I5022 Chronic systolic (congestive) heart failure: Secondary | ICD-10-CM

## 2011-09-09 DIAGNOSIS — I4949 Other premature depolarization: Secondary | ICD-10-CM

## 2011-09-09 LAB — ICD DEVICE OBSERVATION
AL AMPLITUDE: 3.1 mv
AL IMPEDENCE ICD: 445 Ohm
CHARGE TIME: 0 s
RV LEAD AMPLITUDE: 11.2 mv
TOT-0007: 2
TOT-0008: 0
TOT-0010: 44
TZAT-0001FASTVT: 1
TZAT-0018FASTVT: NEGATIVE
TZAT-0019FASTVT: 7.5 V
TZAT-0020FASTVT: 1 ms
TZON-0003FASTVT: 330 ms
TZON-0004FASTVT: 30
TZON-0004SLOWVT: 30
TZON-0005FASTVT: 6
TZON-0008FASTVT: 60 ms
TZON-0008SLOWVT: 60 ms
TZON-0010FASTVT: 80 ms
TZON-0010SLOWVT: 80 ms
TZST-0001FASTVT: 2
TZST-0001FASTVT: 3
TZST-0001FASTVT: 4
TZST-0001FASTVT: 5
TZST-0001SLOWVT: 1
TZST-0003FASTVT: 25 J
TZST-0003FASTVT: 36 J
TZST-0003FASTVT: 36 J
VENTRICULAR PACING ICD: 1 pct

## 2011-09-09 NOTE — Assessment & Plan Note (Signed)
His amiodarone has been up titrated. Hopefully this will result in reduction of his PVCs.

## 2011-09-09 NOTE — Patient Instructions (Addendum)
Follow up with Dr Taylor in 6 months. 

## 2011-09-09 NOTE — Assessment & Plan Note (Signed)
His symptoms are currently class II. He will continue his current medical therapy and maintain a low-sodium diet. 

## 2011-09-09 NOTE — Assessment & Plan Note (Signed)
His device is working normally. He is approaching elective replacement. Will recheck in several months.

## 2011-09-09 NOTE — Progress Notes (Signed)
HPI Mr. Balan returns today for followup. He is this pleasant 66 year old man with a nonischemic cardiomyopathy, nonsustained VT, chronic systolic heart failure. The patient's left ventricular function had initially normalized after his PVCs were reduced. Several months ago, he developed influenza and his ectopy increased as did his heart failure symptoms. He was found to have over 10% PVCs. His repeat echo demonstrated an ejection fraction of 30%. He has had no syncope and has been febrile and has not fired. He denies chest pain. He denies medical noncompliance. He is approaching but not yet at elective replacement indication.  Allergies  Allergen Reactions  . Ace Inhibitors   . Aspirin   . Nsaids      Current Outpatient Prescriptions  Medication Sig Dispense Refill  . ALPRAZolam (XANAX) 0.25 MG tablet Take 0.25 mg by mouth at bedtime as needed.        Marland Kitchen amiodarone (PACERONE) 200 MG tablet Take 200 mg by mouth daily.      Marland Kitchen amLODipine (NORVASC) 5 MG tablet Take 1 tablet (5 mg total) by mouth daily.  90 tablet  4  . atorvastatin (LIPITOR) 40 MG tablet Take 0.5 tablets (20 mg total) by mouth daily.  15 tablet  11  . carvedilol (COREG) 25 MG tablet 2 tabs po bid       . EPIPEN 2-PAK 0.3 MG/0.3ML DEVI as needed.      . furosemide (LASIX) 20 MG tablet Take 1 tablet (20 mg total) by mouth daily. Take an extra 20mg  daily as needed  30 tablet  11  . hydrALAZINE (APRESOLINE) 10 MG tablet Take 1 tablet (10 mg total) by mouth 3 (three) times daily.  270 tablet  4  . isosorbide mononitrate (IMDUR) 30 MG 24 hr tablet Take 1 tablet (30 mg total) by mouth daily.  90 tablet  4  . magnesium gluconate (MAGONATE) 500 MG tablet 1 tab po qd       . potassium chloride SA (KLOR-CON M20) 20 MEQ tablet Take 1/2 (half) tablet daily  45 tablet  3     Past Medical History  Diagnosis Date  . CHF (congestive heart failure)   . Cardiomyopathy   . PVC (premature ventricular contraction)   . Hyperlipidemia   .  HTN (hypertension)   . Osteoporosis   . Abnormal chest CT     ROS:   All systems reviewed and negative except as noted in the HPI.   Past Surgical History  Procedure Date  . Knee arthroplasty     Total left knee 2006  . Knee arthroplasty     Total right knee x2  . Implantation of dual-chamber icd.   . Replacement total knee     right     Family History  Problem Relation Age of Onset  . Lung cancer    . Heart attack       History   Social History  . Marital Status: Married    Spouse Name: N/A    Number of Children: N/A  . Years of Education: N/A   Occupational History  . Not on file.   Social History Main Topics  . Smoking status: Former Smoker -- 1.0 packs/day for 10 years    Types: Cigarettes    Quit date: 12/31/1970  . Smokeless tobacco: Never Used  . Alcohol Use: Yes  . Drug Use: No  . Sexually Active: Not on file   Other Topics Concern  . Not on file   Social History Narrative  .  No narrative on file     BP 124/68  Pulse 63  Ht 5\' 9"  (1.753 m)  Wt 99.973 kg (220 lb 6.4 oz)  BMI 32.55 kg/m2  SpO2 96%  Physical Exam:  Well appearing NAD HEENT: Unremarkable Neck:  No JVD, no thyromegally Lymphatics:  No adenopathy Back:  No CVA tenderness Lungs:  Clear with no wheezes, rales, or rhonchi. Well-healed ICD incision. HEART:  Regular rate rhythm, no murmurs, no rubs, no clicks Abd:  soft, positive bowel sounds, no organomegally, no rebound, no guarding Ext:  2 plus pulses, no edema, no cyanosis, no clubbing Skin:  No rashes no nodules Neuro:  CN II through XII intact, motor grossly intact  DEVICE  Normal device function.  See PaceArt for details.   Assess/Plan:

## 2011-09-11 ENCOUNTER — Encounter: Payer: Self-pay | Admitting: Cardiology

## 2011-11-04 ENCOUNTER — Ambulatory Visit (INDEPENDENT_AMBULATORY_CARE_PROVIDER_SITE_OTHER): Payer: BC Managed Care – PPO | Admitting: Cardiology

## 2011-11-04 ENCOUNTER — Encounter: Payer: Self-pay | Admitting: Cardiology

## 2011-11-04 VITALS — BP 138/76 | HR 61 | Ht 69.0 in | Wt 231.0 lb

## 2011-11-04 DIAGNOSIS — I4891 Unspecified atrial fibrillation: Secondary | ICD-10-CM

## 2011-11-04 MED ORDER — HYDRALAZINE HCL 10 MG PO TABS: ORAL_TABLET | ORAL | Status: DC

## 2011-11-04 NOTE — Assessment & Plan Note (Signed)
Felt to be related to ectopy. Amiodarone recently increased. Continue beta blocker. Increase hydralazine to 20 mg 3 times a day and continue nitrates. Plan repeat echocardiogram when he returns in 4 months. Will have his most recent chest x-ray, TSH and liver functions forwarded to [place] from his primary care physician.

## 2011-11-04 NOTE — Assessment & Plan Note (Signed)
Management per electrophysiology. 

## 2011-11-04 NOTE — Assessment & Plan Note (Signed)
Blood pressure controlled. Continue present medications. 

## 2011-11-04 NOTE — Assessment & Plan Note (Signed)
Continue amiodarone. 

## 2011-11-04 NOTE — Progress Notes (Signed)
HPI:[name] is a pleasant gentleman, who has a history of nonischemic cardiomyopathy improved on fu echocardiogram. He also has had a previous ICD. Note, his cardiomyopathy was felt possibly secondary to ventricular ectopy in the past and he is on amiodarone for that. Fu echo in Nov 2012 showed EF 30-35, mild MR, trace AI, moderate LAE, moderate to severe RAE, mild RVE. TSH normal. Because of his worsening LV function we performed a Holter monitor. This showed frequent PVCs and nonsustained ventricular tachycardia. I was concerned that the ectopy was contributing to his reduced LV function. I increased his amiodarone. Since that time, he denies dyspnea, chest pain, palpitations or syncope.   Current Outpatient Prescriptions  Medication Sig Dispense Refill  . ALPRAZolam (XANAX) 0.25 MG tablet Take 0.25 mg by mouth at bedtime as needed.        Marland Kitchen amiodarone (PACERONE) 200 MG tablet Take 200 mg by mouth daily.      Marland Kitchen amLODipine (NORVASC) 5 MG tablet Take 1 tablet (5 mg total) by mouth daily.  90 tablet  4  . atorvastatin (LIPITOR) 40 MG tablet Take 0.5 tablets (20 mg total) by mouth daily.  15 tablet  11  . carvedilol (COREG) 25 MG tablet 2 tabs po bid       . EPIPEN 2-PAK 0.3 MG/0.3ML DEVI as needed.      . fexofenadine-pseudoephedrine (ALLEGRA-D ALLERGY & CONGESTION) 180-240 MG per 24 hr tablet Take 1 tablet by mouth daily.      . furosemide (LASIX) 20 MG tablet Take 1 tablet (20 mg total) by mouth daily. Take an extra 20mg  daily as needed  30 tablet  11  . hydrALAZINE (APRESOLINE) 10 MG tablet Take 1 tablet (10 mg total) by mouth 3 (three) times daily.  270 tablet  4  . isosorbide mononitrate (IMDUR) 30 MG 24 hr tablet Take 1 tablet (30 mg total) by mouth daily.  90 tablet  4  . magnesium gluconate (MAGONATE) 500 MG tablet 1 tab po qd       . potassium chloride SA (KLOR-CON M20) 20 MEQ tablet Take 1/2 (half) tablet daily  45 tablet  3  . predniSONE (DELTASONE) 20 MG tablet As directed          Past Medical History  Diagnosis Date  . CHF (congestive heart failure)   . Cardiomyopathy   . PVC (premature ventricular contraction)   . Hyperlipidemia   . HTN (hypertension)   . Osteoporosis   . Abnormal chest CT     Past Surgical History  Procedure Date  . Knee arthroplasty     Total left knee 2006  . Knee arthroplasty     Total right knee x2  . Implantation of dual-chamber icd.   . Replacement total knee     right    History   Social History  . Marital Status: Married    Spouse Name: N/A    Number of Children: N/A  . Years of Education: N/A   Occupational History  . Not on file.   Social History Main Topics  . Smoking status: Former Smoker -- 1.0 packs/day for 10 years    Types: Cigarettes    Quit date: 12/31/1970  . Smokeless tobacco: Never Used  . Alcohol Use: Yes  . Drug Use: No  . Sexually Active: Not on file   Other Topics Concern  . Not on file   Social History Narrative  . No narrative on file    ROS: no  fevers or chills, productive cough, hemoptysis, dysphasia, odynophagia, melena, hematochezia, dysuria, hematuria, rash, seizure activity, orthopnea, PND, pedal edema, claudication. Remaining systems are negative.  Physical Exam: Well-developed well-nourished in no acute distress.  Skin is warm and dry.  HEENT is normal.  Neck is supple. No thyromegaly.  Chest is clear to auscultation with normal expansion.  Cardiovascular exam is regular rate and rhythm.  Abdominal exam nontender or distended. No masses palpated. Extremities show no edema. neuro grossly intact  ECG sinus rhythm at a rate of 61. First degree AV block. Cannot rule out prior inferior infarct. Nonspecific ST changes.

## 2011-11-04 NOTE — Assessment & Plan Note (Signed)
Management per primary care. 

## 2011-11-04 NOTE — Patient Instructions (Signed)
Your physician wants you to follow-up in: 4 MONTHS You will receive a reminder letter in the mail two months in advance. If you don't receive a letter, please call our office to schedule the follow-up appointment.   INCREASE HYDRALAZINE TO 10 MG TAKE TWO TABLETS THREE TIMES DAILY

## 2011-12-11 ENCOUNTER — Ambulatory Visit (INDEPENDENT_AMBULATORY_CARE_PROVIDER_SITE_OTHER): Payer: BC Managed Care – PPO | Admitting: *Deleted

## 2011-12-11 ENCOUNTER — Other Ambulatory Visit: Payer: Self-pay | Admitting: Internal Medicine

## 2011-12-11 ENCOUNTER — Encounter: Payer: Self-pay | Admitting: Internal Medicine

## 2011-12-11 DIAGNOSIS — I428 Other cardiomyopathies: Secondary | ICD-10-CM

## 2011-12-15 LAB — REMOTE ICD DEVICE
AL AMPLITUDE: 3 mv
AL IMPEDENCE ICD: 435 Ohm
BAMS-0003: 60 {beats}/min
BATTERY VOLTAGE: 2.55 V
DEVICE MODEL ICD: 340448
RV LEAD IMPEDENCE ICD: 465 Ohm
TZAT-0012FASTVT: 200 ms
TZAT-0013FASTVT: 4
TZAT-0018FASTVT: NEGATIVE
TZAT-0019FASTVT: 7.5 V
TZAT-0020FASTVT: 1 ms
TZON-0003FASTVT: 330 ms
TZON-0003SLOWVT: 375 ms
TZON-0004FASTVT: 30
TZON-0004SLOWVT: 30
TZON-0005FASTVT: 6
TZON-0008FASTVT: 60 ms
TZON-0010SLOWVT: 80 ms
TZST-0001FASTVT: 2
TZST-0001FASTVT: 4
TZST-0003FASTVT: 36 J
TZST-0003FASTVT: 36 J

## 2011-12-16 NOTE — Progress Notes (Signed)
Remote icd check  

## 2012-01-02 ENCOUNTER — Encounter: Payer: Self-pay | Admitting: *Deleted

## 2012-01-20 ENCOUNTER — Telehealth: Payer: Self-pay | Admitting: Cardiology

## 2012-01-20 MED ORDER — AMLODIPINE BESYLATE 5 MG PO TABS: 5.0000 mg | ORAL_TABLET | Freq: Every day | ORAL | Status: DC

## 2012-01-20 NOTE — Telephone Encounter (Signed)
New Problem: ° ° ° °Patient called in needing a 90 day refill of his amLODipine (NORVASC) 5 MG tablet. °

## 2012-02-26 ENCOUNTER — Other Ambulatory Visit: Payer: Self-pay | Admitting: *Deleted

## 2012-02-26 MED ORDER — CARVEDILOL 25 MG PO TABS: ORAL_TABLET | ORAL | Status: DC

## 2012-02-26 NOTE — Telephone Encounter (Signed)
Refill carvedilol

## 2012-03-12 ENCOUNTER — Ambulatory Visit (INDEPENDENT_AMBULATORY_CARE_PROVIDER_SITE_OTHER)
Admission: RE | Admit: 2012-03-12 | Discharge: 2012-03-12 | Disposition: A | Discharge status: Home / Self Care | Payer: BC Managed Care – PPO | Source: Ambulatory Visit | Attending: Cardiology | Admitting: Cardiology

## 2012-03-12 ENCOUNTER — Ambulatory Visit (INDEPENDENT_AMBULATORY_CARE_PROVIDER_SITE_OTHER): Payer: BC Managed Care – PPO | Admitting: Cardiology

## 2012-03-12 ENCOUNTER — Encounter: Payer: Self-pay | Admitting: Cardiology

## 2012-03-12 VITALS — BP 120/76 | HR 58 | Wt 229.0 lb

## 2012-03-12 DIAGNOSIS — E785 Hyperlipidemia, unspecified: Secondary | ICD-10-CM

## 2012-03-12 DIAGNOSIS — I4891 Unspecified atrial fibrillation: Secondary | ICD-10-CM

## 2012-03-12 DIAGNOSIS — I428 Other cardiomyopathies: Secondary | ICD-10-CM

## 2012-03-12 DIAGNOSIS — I5022 Chronic systolic (congestive) heart failure: Secondary | ICD-10-CM

## 2012-03-12 DIAGNOSIS — Z95810 Presence of automatic (implantable) cardiac defibrillator: Secondary | ICD-10-CM

## 2012-03-12 DIAGNOSIS — I4949 Other premature depolarization: Secondary | ICD-10-CM

## 2012-03-12 DIAGNOSIS — I429 Cardiomyopathy, unspecified: Secondary | ICD-10-CM

## 2012-03-12 DIAGNOSIS — I10 Essential (primary) hypertension: Secondary | ICD-10-CM

## 2012-03-12 NOTE — Assessment & Plan Note (Signed)
Continue beta blocker and nitrates. Hydralazine was discontinued as it was felt it may be contributing to allergies. He has an allergy to ACE inhibitors. His cardiomyopathy is felt possibly secondary to frequent PVCs and his amiodarone was increased previously. Plan repeat echocardiogram. Hopefully LV function will have improved with suppression of his PVCs.

## 2012-03-12 NOTE — Assessment & Plan Note (Signed)
Continue amiodarone. Check PA and lateral chest x-ray. He is to have blood drawn Monday. I will make sure that he has liver functions and TSH.

## 2012-03-12 NOTE — Patient Instructions (Addendum)
Your physician wants you to follow-up in: 6 MONTHS WITH DR Jens Som You will receive a reminder letter in the mail two months in advance. If you don't receive a letter, please call our office to schedule the follow-up appointment.   Your physician has requested that you have an echocardiogram. Echocardiography is a painless test that uses sound waves to create images of your heart. It provides your doctor with information about the size and shape of your heart and how well your heart's chambers and valves are working. This procedure takes approximately one hour. There are no restrictions for this procedure.   A chest x-ray takes a picture of the organs and structures inside the chest, including the heart, lungs, and blood vessels. This test can show several things, including, whether the heart is enlarges; whether fluid is building up in the lungs; and whether pacemaker / defibrillator leads are still in place. AT [address]  Your physician recommends that you HAVE BMP/LIVER/TSH

## 2012-03-12 NOTE — Assessment & Plan Note (Signed)
Management per electrophysiology. 

## 2012-03-12 NOTE — Assessment & Plan Note (Signed)
Management per primary care. 

## 2012-03-12 NOTE — Progress Notes (Signed)
[name] is a pleasant gentleman, who has a history of nonischemic cardiomyopathy improved on fu echocardiogram. He also has had a previous ICD. Note, his cardiomyopathy was felt possibly secondary to ventricular ectopy in the past and he is on amiodarone for that. Fu echo in Nov 2012 showed EF 30-35, mild MR, trace AI, moderate LAE, moderate to severe RAE, mild RVE. TSH normal. Because of his worsening LV function we performed a Holter monitor. This showed frequent PVCs and nonsustained ventricular tachycardia. I was concerned that the ectopy was contributing to his reduced LV function. I increased his amiodarone. Patient last seen in March of 2013. Since that time, he denies dyspnea, chest pain, palpitations or syncope.    Current Outpatient Prescriptions  Medication Sig Dispense Refill  . ALPRAZolam (XANAX) 0.25 MG tablet Take 0.25 mg by mouth at bedtime as needed.        Marland Kitchen amiodarone (PACERONE) 200 MG tablet Take 200 mg by mouth daily.      Marland Kitchen amLODipine (NORVASC) 5 MG tablet Take 1 tablet (5 mg total) by mouth daily.  90 tablet  4  . atorvastatin (LIPITOR) 40 MG tablet Take 0.5 tablets (20 mg total) by mouth daily.  15 tablet  11  . carvedilol (COREG) 25 MG tablet 2 tabs po bid as directed  360 tablet  3  . EPIPEN 2-PAK 0.3 MG/0.3ML DEVI as needed.      . fexofenadine-pseudoephedrine (ALLEGRA-D ALLERGY & CONGESTION) 180-240 MG per 24 hr tablet Take 1 tablet by mouth daily.      . furosemide (LASIX) 20 MG tablet Take 1 tablet (20 mg total) by mouth daily. Take an extra 20mg  daily as needed  30 tablet  11  . isosorbide mononitrate (IMDUR) 30 MG 24 hr tablet Take 60 mg by mouth daily.      . magnesium gluconate (MAGONATE) 500 MG tablet 1 tab po qd       . potassium chloride SA (KLOR-CON M20) 20 MEQ tablet Take 1/2 (half) tablet daily  45 tablet  3  . DISCONTD: isosorbide mononitrate (IMDUR) 30 MG 24 hr tablet Take 1 tablet (30 mg total) by mouth daily.  90 tablet  4     Past Medical  History  Diagnosis Date  . CHF (congestive heart failure)   . Cardiomyopathy   . PVC (premature ventricular contraction)   . Hyperlipidemia   . HTN (hypertension)   . Osteoporosis   . Abnormal chest CT     Past Surgical History  Procedure Date  . Knee arthroplasty     Total left knee 2006  . Knee arthroplasty     Total right knee x2  . Implantation of dual-chamber icd.   . Replacement total knee     right    History   Social History  . Marital Status: Married    Spouse Name: N/A    Number of Children: N/A  . Years of Education: N/A   Occupational History  . Not on file.   Social History Main Topics  . Smoking status: Former Smoker -- 1.0 packs/day for 10 years    Types: Cigarettes    Quit date: 12/31/1970  . Smokeless tobacco: Never Used  . Alcohol Use: Yes  . Drug Use: No  . Sexually Active: Not on file   Other Topics Concern  . Not on file   Social History Narrative  . No narrative on file    ROS: no fevers or chills, productive cough, hemoptysis, dysphasia,  odynophagia, melena, hematochezia, dysuria, hematuria, rash, seizure activity, orthopnea, PND, pedal edema, claudication. Remaining systems are negative.  Physical Exam: Well-developed well-nourished in no acute distress.  Skin is warm and dry.  HEENT is normal.  Neck is supple.  Chest is clear to auscultation with normal expansion. ICD left chest. Cardiovascular exam is regular rate and rhythm.  Abdominal exam nontender or distended. No masses palpated. Extremities show trace edema. neuro grossly intact  ECG sinus rhythm at a rate of 58. Intermittent atrial pacing. Occasional PVC. First degree AV block. Nonspecific ST changes.

## 2012-03-12 NOTE — Assessment & Plan Note (Signed)
Patient euvolemic on examination. Continue present dose of diuretic. 

## 2012-03-12 NOTE — Assessment & Plan Note (Signed)
Blood pressure controlled. Continue present medications. 

## 2012-03-15 ENCOUNTER — Encounter: Payer: Self-pay | Admitting: Cardiology

## 2012-03-16 ENCOUNTER — Ambulatory Visit (HOSPITAL_COMMUNITY): Payer: BC Managed Care – PPO | Attending: Cardiology | Admitting: Radiology

## 2012-03-16 ENCOUNTER — Other Ambulatory Visit: Payer: Self-pay | Admitting: *Deleted

## 2012-03-16 ENCOUNTER — Other Ambulatory Visit: Payer: Self-pay

## 2012-03-16 ENCOUNTER — Telehealth: Payer: Self-pay | Admitting: Cardiology

## 2012-03-16 DIAGNOSIS — I079 Rheumatic tricuspid valve disease, unspecified: Secondary | ICD-10-CM | POA: Insufficient documentation

## 2012-03-16 DIAGNOSIS — I429 Cardiomyopathy, unspecified: Secondary | ICD-10-CM

## 2012-03-16 DIAGNOSIS — I10 Essential (primary) hypertension: Secondary | ICD-10-CM

## 2012-03-16 DIAGNOSIS — I4891 Unspecified atrial fibrillation: Secondary | ICD-10-CM

## 2012-03-16 DIAGNOSIS — E785 Hyperlipidemia, unspecified: Secondary | ICD-10-CM | POA: Insufficient documentation

## 2012-03-16 DIAGNOSIS — Z01810 Encounter for preprocedural cardiovascular examination: Secondary | ICD-10-CM

## 2012-03-16 DIAGNOSIS — I379 Nonrheumatic pulmonary valve disorder, unspecified: Secondary | ICD-10-CM | POA: Insufficient documentation

## 2012-03-16 DIAGNOSIS — I080 Rheumatic disorders of both mitral and aortic valves: Secondary | ICD-10-CM | POA: Insufficient documentation

## 2012-03-16 DIAGNOSIS — I77810 Thoracic aortic ectasia: Secondary | ICD-10-CM

## 2012-03-16 DIAGNOSIS — E669 Obesity, unspecified: Secondary | ICD-10-CM | POA: Insufficient documentation

## 2012-03-16 DIAGNOSIS — I428 Other cardiomyopathies: Secondary | ICD-10-CM | POA: Insufficient documentation

## 2012-03-16 NOTE — Progress Notes (Signed)
Echocardiogram performed.  

## 2012-03-16 NOTE — Telephone Encounter (Signed)
Close  

## 2012-03-18 ENCOUNTER — Encounter: Payer: BC Managed Care – PPO | Admitting: *Deleted

## 2012-03-22 ENCOUNTER — Encounter: Payer: Self-pay | Admitting: *Deleted

## 2012-03-25 ENCOUNTER — Telehealth: Payer: Self-pay | Admitting: Cardiology

## 2012-03-25 ENCOUNTER — Encounter: Payer: Self-pay | Admitting: *Deleted

## 2012-03-25 ENCOUNTER — Ambulatory Visit (INDEPENDENT_AMBULATORY_CARE_PROVIDER_SITE_OTHER): Payer: BC Managed Care – PPO | Admitting: *Deleted

## 2012-03-25 ENCOUNTER — Ambulatory Visit (INDEPENDENT_AMBULATORY_CARE_PROVIDER_SITE_OTHER)
Admission: RE | Admit: 2012-03-25 | Discharge: 2012-03-25 | Disposition: A | Discharge status: Home / Self Care | Payer: BC Managed Care – PPO | Source: Ambulatory Visit | Attending: Cardiology | Admitting: Cardiology

## 2012-03-25 ENCOUNTER — Encounter: Payer: Self-pay | Admitting: Internal Medicine

## 2012-03-25 DIAGNOSIS — N289 Disorder of kidney and ureter, unspecified: Secondary | ICD-10-CM

## 2012-03-25 DIAGNOSIS — I77810 Thoracic aortic ectasia: Secondary | ICD-10-CM

## 2012-03-25 DIAGNOSIS — I428 Other cardiomyopathies: Secondary | ICD-10-CM

## 2012-03-25 DIAGNOSIS — Z95810 Presence of automatic (implantable) cardiac defibrillator: Secondary | ICD-10-CM | POA: Insufficient documentation

## 2012-03-25 LAB — REMOTE ICD DEVICE
AL IMPEDENCE ICD: 420 Ohm
BAMS-0001: 170 {beats}/min
BAMS-0003: 60 {beats}/min
RV LEAD AMPLITUDE: 11.4 mv
TZAT-0004FASTVT: 8
TZAT-0012FASTVT: 200 ms
TZAT-0013FASTVT: 4
TZAT-0018FASTVT: NEGATIVE
TZAT-0019FASTVT: 7.5 V
TZON-0003FASTVT: 330 ms
TZON-0004FASTVT: 30
TZON-0005SLOWVT: 6
TZON-0010FASTVT: 80 ms
TZST-0001FASTVT: 3
TZST-0001FASTVT: 5
TZST-0001SLOWVT: 1
TZST-0002SLOWVT: NEGATIVE
TZST-0003FASTVT: 36 J
TZST-0003FASTVT: 36 J
TZST-0003FASTVT: 36 J
VENTRICULAR PACING ICD: 1 pct

## 2012-03-25 MED ORDER — IOHEXOL 350 MG/ML SOLN
100.0000 mL | Freq: Once | INTRAVENOUS | Status: AC | PRN
  Administered 2012-03-25: 100 mL via INTRAVENOUS

## 2012-03-25 NOTE — Telephone Encounter (Signed)
Spoke with pt, aware of CTA results. Due to kidney function being alittle elevated prior to the CTA, per dr Jens Som the pt will come by the office in acouple weeks for repeat bmp.

## 2012-03-25 NOTE — Telephone Encounter (Signed)
Patient returning nurse call, he can be reached at [phone]

## 2012-04-01 ENCOUNTER — Encounter: Payer: Self-pay | Admitting: *Deleted

## 2012-06-28 ENCOUNTER — Encounter: Payer: BC Managed Care – PPO | Admitting: *Deleted

## 2012-06-29 ENCOUNTER — Encounter: Payer: Self-pay | Admitting: *Deleted

## 2012-07-08 ENCOUNTER — Other Ambulatory Visit: Payer: Self-pay | Admitting: Internal Medicine

## 2012-07-08 ENCOUNTER — Ambulatory Visit (INDEPENDENT_AMBULATORY_CARE_PROVIDER_SITE_OTHER): Payer: BC Managed Care – PPO | Admitting: *Deleted

## 2012-07-08 ENCOUNTER — Encounter: Payer: Self-pay | Admitting: Internal Medicine

## 2012-07-08 DIAGNOSIS — I4949 Other premature depolarization: Secondary | ICD-10-CM

## 2012-07-08 DIAGNOSIS — I428 Other cardiomyopathies: Secondary | ICD-10-CM

## 2012-07-08 DIAGNOSIS — Z95810 Presence of automatic (implantable) cardiac defibrillator: Secondary | ICD-10-CM

## 2012-07-08 MED ORDER — AMIODARONE HCL 200 MG PO TABS: 200.0000 mg | ORAL_TABLET | Freq: Every day | ORAL | Status: DC

## 2012-07-08 NOTE — Telephone Encounter (Signed)
Also send 90 to [facility]. Pt is out of meds today

## 2012-07-08 NOTE — Telephone Encounter (Signed)
Refilled Amiodarone sent to [facility] local pharmacy.

## 2012-07-09 LAB — REMOTE ICD DEVICE
AL IMPEDENCE ICD: 385 Ohm
ATRIAL PACING ICD: 2 pct
BATTERY VOLTAGE: 2.55 V
RV LEAD AMPLITUDE: 10.9 mv
TZAT-0001FASTVT: 1
TZAT-0018FASTVT: NEGATIVE
TZAT-0019FASTVT: 7.5 V
TZON-0003FASTVT: 330 ms
TZON-0003SLOWVT: 375 ms
TZON-0004FASTVT: 30
TZON-0004SLOWVT: 30
TZON-0005SLOWVT: 6
TZON-0008FASTVT: 60 ms
TZON-0010FASTVT: 80 ms
TZST-0001FASTVT: 2
TZST-0001FASTVT: 4
TZST-0002SLOWVT: NEGATIVE
TZST-0003FASTVT: 25 J
TZST-0003FASTVT: 36 J
VENTRICULAR PACING ICD: 1 pct

## 2012-08-18 HISTORY — PX: CARDIAC DEFIBRILLATOR REMOVAL: SHX1291

## 2012-08-19 ENCOUNTER — Telehealth: Payer: Self-pay | Admitting: Cardiology

## 2012-08-19 MED ORDER — POTASSIUM CHLORIDE CRYS ER 20 MEQ PO TBCR: EXTENDED_RELEASE_TABLET | ORAL | Status: DC

## 2012-08-19 MED ORDER — FUROSEMIDE 20 MG PO TABS: 20.0000 mg | ORAL_TABLET | Freq: Every day | ORAL | Status: DC

## 2012-08-19 NOTE — Telephone Encounter (Signed)
New Problem:    Patient called in needing a 90 day refill of his potassium chloride SA (KLOR-CON M20) 20 MEQ tablet and furosemide (LASIX) 20 MG tablet.

## 2012-09-14 ENCOUNTER — Encounter: Payer: Self-pay | Admitting: Cardiology

## 2012-09-14 ENCOUNTER — Ambulatory Visit (INDEPENDENT_AMBULATORY_CARE_PROVIDER_SITE_OTHER): Payer: BC Managed Care – PPO | Admitting: Cardiology

## 2012-09-14 ENCOUNTER — Encounter: Payer: Self-pay | Admitting: Internal Medicine

## 2012-09-14 ENCOUNTER — Ambulatory Visit (INDEPENDENT_AMBULATORY_CARE_PROVIDER_SITE_OTHER)
Admission: RE | Admit: 2012-09-14 | Discharge: 2012-09-14 | Disposition: A | Discharge status: Home / Self Care | Payer: BC Managed Care – PPO | Source: Ambulatory Visit | Attending: Cardiology | Admitting: Cardiology

## 2012-09-14 ENCOUNTER — Ambulatory Visit (INDEPENDENT_AMBULATORY_CARE_PROVIDER_SITE_OTHER): Payer: BC Managed Care – PPO | Admitting: Internal Medicine

## 2012-09-14 VITALS — BP 150/80 | Ht 69.0 in | Wt 224.5 lb

## 2012-09-14 VITALS — BP 140/80 | HR 53 | Ht 69.0 in | Wt 223.0 lb

## 2012-09-14 DIAGNOSIS — I428 Other cardiomyopathies: Secondary | ICD-10-CM

## 2012-09-14 DIAGNOSIS — I4891 Unspecified atrial fibrillation: Secondary | ICD-10-CM

## 2012-09-14 DIAGNOSIS — I5022 Chronic systolic (congestive) heart failure: Secondary | ICD-10-CM

## 2012-09-14 DIAGNOSIS — I4949 Other premature depolarization: Secondary | ICD-10-CM

## 2012-09-14 DIAGNOSIS — Z95810 Presence of automatic (implantable) cardiac defibrillator: Secondary | ICD-10-CM

## 2012-09-14 LAB — ICD DEVICE OBSERVATION
AL AMPLITUDE: 3.1 mv
BAMS-0001: 170 {beats}/min
BAMS-0003: 60 {beats}/min
CHARGE TIME: 0 s
TOT-0007: 2
TOT-0008: 0
TOT-0009: 4
TZAT-0004FASTVT: 8
TZAT-0012FASTVT: 200 ms
TZAT-0018FASTVT: NEGATIVE
TZAT-0019FASTVT: 7.5 V
TZON-0003FASTVT: 330 ms
TZON-0004FASTVT: 30
TZON-0008SLOWVT: 60 ms
TZON-0010FASTVT: 80 ms
TZST-0001FASTVT: 3
TZST-0002SLOWVT: NEGATIVE
TZST-0003FASTVT: 36 J
VENTRICULAR PACING ICD: 0 pct

## 2012-09-14 MED ORDER — POTASSIUM CHLORIDE CRYS ER 20 MEQ PO TBCR: EXTENDED_RELEASE_TABLET | ORAL | Status: DC

## 2012-09-14 MED ORDER — ATORVASTATIN CALCIUM 40 MG PO TABS: 20.0000 mg | ORAL_TABLET | Freq: Every day | ORAL | Status: DC

## 2012-09-14 MED ORDER — CARVEDILOL 25 MG PO TABS: ORAL_TABLET | ORAL | Status: DC

## 2012-09-14 NOTE — Assessment & Plan Note (Signed)
Euvolemic on examination.Continue present dose of Lasix. 

## 2012-09-14 NOTE — Assessment & Plan Note (Signed)
Continue statin. 

## 2012-09-14 NOTE — Assessment & Plan Note (Signed)
Followup electrophysiology. His generator apparently is nearing end-of-life. Given his improved LV function he may not require replacement.

## 2012-09-14 NOTE — Assessment & Plan Note (Addendum)
Continue amiodarone and beta blocker.

## 2012-09-14 NOTE — Patient Instructions (Addendum)
Dr. Ladona Ridgel would like you to follow up with Tresa Endo, RN in the [place] office @ [phone] to discuss taking out the Defibrillator change out.

## 2012-09-14 NOTE — Assessment & Plan Note (Addendum)
Continue beta blocker. Previous allergy to ACE inhibitor and hydralazine. Ejection fraction now 45%. Previous reduction in LV function felt secondary to ectopy. Continue amiodarone. Check chest x-ray. We will make sure that he has had a recent TSH and liver functions with primary care.

## 2012-09-14 NOTE — Assessment & Plan Note (Signed)
Continue present medications. Potassium and renal function monitored by primary care. 

## 2012-09-14 NOTE — Assessment & Plan Note (Signed)
His heart failure symptoms are class 1-2. No change in medical therapy.

## 2012-09-14 NOTE — Progress Notes (Signed)
HPI: [name] is a pleasant gentleman, who has a history of nonischemic cardiomyopathy improved for fu. He also has had a previous ICD. 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 Previous holter monitor in Dec 2012 showed frequent PVCs and nonsustained ventricular tachycardia.  Fu echo in July 2013 showed EF 45, trace AI/MR, aortic root 45 mm, mild LAE. CTA August 2013 showed aortic root 3.8 cm (no aneurysm). I last saw him in July of 2013. Since that time, he denies dyspnea, chest pain, palpitations or syncope.   Current Outpatient Prescriptions  Medication Sig Dispense Refill  . ALPRAZolam (XANAX) 0.25 MG tablet Take 0.25 mg by mouth at bedtime as needed.        Marland Kitchen amiodarone (PACERONE) 200 MG tablet Take 1 tablet (200 mg total) by mouth daily.  30 tablet  0  . amLODipine (NORVASC) 5 MG tablet Take 1 tablet (5 mg total) by mouth daily.  90 tablet  4  . atorvastatin (LIPITOR) 40 MG tablet Take 0.5 tablets (20 mg total) by mouth daily.  15 tablet  11  . carvedilol (COREG) 25 MG tablet 2 tabs po bid as directed  360 tablet  3  . EPIPEN 2-PAK 0.3 MG/0.3ML DEVI as needed.      . fexofenadine-pseudoephedrine (ALLEGRA-D ALLERGY & CONGESTION) 180-240 MG per 24 hr tablet Take 1 tablet by mouth as needed.       . furosemide (LASIX) 20 MG tablet Take 1 tablet (20 mg total) by mouth daily. Take an extra 20mg  daily as needed  90 tablet  3  . isosorbide mononitrate (IMDUR) 30 MG 24 hr tablet Take 60 mg by mouth daily.      . magnesium gluconate (MAGONATE) 500 MG tablet 1 tab po qd       . potassium chloride SA (KLOR-CON M20) 20 MEQ tablet Take 1/2 (half) tablet daily  90 tablet  3     Past Medical History  Diagnosis Date  . CHF (congestive heart failure)   . Cardiomyopathy   . PVC (premature ventricular contraction)   . Hyperlipidemia   . HTN (hypertension)   . Osteoporosis   . Abnormal chest CT     Past Surgical  History  Procedure Date  . Knee arthroplasty     Total left knee 2006  . Knee arthroplasty     Total right knee x2  . Implantation of dual-chamber icd.   . Replacement total knee     right    History   Social History  . Marital Status: Married    Spouse Name: N/A    Number of Children: N/A  . Years of Education: N/A   Occupational History  . Not on file.   Social History Main Topics  . Smoking status: Former Smoker -- 1.0 packs/day for 10 years    Types: Cigarettes    Quit date: 12/31/1970  . Smokeless tobacco: Never Used  . Alcohol Use: Yes  . Drug Use: No  . Sexually Active: Not on file   Other Topics Concern  . Not on file   Social History Narrative  . No narrative on file    ROS: no fevers or chills, productive cough, hemoptysis, dysphasia, odynophagia, melena, hematochezia, dysuria, hematuria, rash, seizure activity, orthopnea, PND, pedal edema, claudication. Remaining systems are negative.  Physical Exam: Well-developed well-nourished in no acute distress.  Skin is warm and dry.  HEENT is normal.  Neck is supple.  Chest is clear to auscultation with normal expansion.  Cardiovascular exam is regular rate and rhythm.  Abdominal exam nontender or distended. No masses palpated. Extremities show no edema. neuro grossly intact  ECG sinus rhythm with first degree AV block. Nonspecific ST changes.

## 2012-09-14 NOTE — Assessment & Plan Note (Signed)
His device is now at elective replacement, having reverted to the reset mode. I discussed the treatment options with the patient in detail. One option would be to change out his device. A second option would be to leave the device in place. A third option would be to remove the generator, and place a cap on the leads. After discussing the pros and cons of all 3 approaches, we will schedule him to come in and have the generator removed in the next few weeks. Because the device has reverted to a reset mode, we will turn the device off as far as shock therapies are concerned. Backup pacing will be left in place.

## 2012-09-14 NOTE — Progress Notes (Signed)
HPI [name] returns today for followup. He is a very pleasant 67 year old man with a nonischemic cardiomyopathy, chronic systolic heart failure, obesity, status post ICD implantation. The patient has had intermittent improvements as well as worsening of his left ventricular dysfunction over the past 5 years. At baseline, he has very dense ventricular ectopy which seems to be related to worsening left ventricular function. Without the ectopy, his left ventricular function typically will nearly normalized. Several months ago, his left ventricular function had gone back down to 30% range in the setting of worsening ventricular ectopy. His amiodarone was reinitiated and repeat 2-D echo demonstrated near normalization of his left ventricular function. His ICD has now reached elective replacement. Allergies  Allergen Reactions  . Ace Inhibitors   . Aspirin   . Nsaids      Current Outpatient Prescriptions  Medication Sig Dispense Refill  . ALPRAZolam (XANAX) 0.25 MG tablet Take 0.25 mg by mouth at bedtime as needed.        Marland Kitchen amiodarone (PACERONE) 200 MG tablet Take 1 tablet (200 mg total) by mouth daily.  30 tablet  0  . amLODipine (NORVASC) 5 MG tablet Take 1 tablet (5 mg total) by mouth daily.  90 tablet  4  . atorvastatin (LIPITOR) 40 MG tablet Take 0.5 tablets (20 mg total) by mouth daily.  90 tablet  3  . carvedilol (COREG) 25 MG tablet 2 tabs po bid as directed  360 tablet  3  . EPIPEN 2-PAK 0.3 MG/0.3ML DEVI as needed.      . fexofenadine-pseudoephedrine (ALLEGRA-D ALLERGY & CONGESTION) 180-240 MG per 24 hr tablet Take 1 tablet by mouth as needed.       . furosemide (LASIX) 20 MG tablet Take 1 tablet (20 mg total) by mouth daily. Take an extra 20mg  daily as needed  90 tablet  3  . isosorbide mononitrate (IMDUR) 30 MG 24 hr tablet Take 60 mg by mouth daily.      . magnesium gluconate (MAGONATE) 500 MG tablet 1 tab po qd       . potassium chloride SA (KLOR-CON M20) 20 MEQ tablet Take 1/2  (half) tablet daily  90 tablet  3     Past Medical History  Diagnosis Date  . CHF (congestive heart failure)   . Cardiomyopathy   . PVC (premature ventricular contraction)   . Hyperlipidemia   . HTN (hypertension)   . Osteoporosis   . Abnormal chest CT     ROS:   All systems reviewed and negative except as noted in the HPI.   Past Surgical History  Procedure Date  . Knee arthroplasty     Total left knee 2006  . Knee arthroplasty     Total right knee x2  . Implantation of dual-chamber icd.   . Replacement total knee     right     Family History  Problem Relation Age of Onset  . Lung cancer    . Heart attack       History   Social History  . Marital Status: Married    Spouse Name: N/A    Number of Children: N/A  . Years of Education: N/A   Occupational History  . Not on file.   Social History Main Topics  . Smoking status: Former Smoker -- 1.0 packs/day for 10 years    Types: Cigarettes    Quit date: 12/31/1970  . Smokeless tobacco: Never Used  . Alcohol Use: Yes  . Drug Use: No  .  Sexually Active: Not on file   Other Topics Concern  . Not on file   Social History Narrative  . No narrative on file     BP 150/80  Ht 5\' 9"  (1.753 m)  Wt 224 lb 8 oz (101.833 kg)  BMI 33.15 kg/m2  Physical Exam:  Well appearing obese, 67 year old man, NAD HEENT: Unremarkable Neck:  7 cm JVD, no thyromegally Lungs:  Clear HEART:  IRegular rate rhythm, no murmurs, no rubs, no clicks Abd:  soft, obese, positive bowel sounds, no organomegally, no rebound, no guarding Ext:  2 plus pulses, no edema, no cyanosis, no clubbing Skin:  No rashes no nodules Neuro:  CN II through XII intact, motor grossly intact   DEVICE  Normal device function.  See PaceArt for details. Device has reverted to a reset mode.  Assess/Plan:

## 2012-09-14 NOTE — Assessment & Plan Note (Signed)
His very dense ventricular ectopy and his left ventricular function had improved. He will continue amiodarone.

## 2012-09-14 NOTE — Patient Instructions (Addendum)
Your physician wants you to follow-up in: 6 MONTHS WITH DR Jens Som You will receive a reminder letter in the mail two months in advance. If you don't receive a letter, please call our office to schedule the follow-up appointment.   A chest x-ray takes a picture of the organs and structures inside the chest, including the heart, lungs, and blood vessels. This test can show several things, including, whether the heart is enlarges; whether fluid is building up in the lungs; and whether pacemaker / defibrillator leads are still in place. AT [address]  Your physician recommends that you return for lab work WITH PCP= BMP/LIVER FUNCTION/TSH DX=427.31/V58.69

## 2012-09-16 ENCOUNTER — Telehealth: Payer: Self-pay

## 2012-09-16 NOTE — Telephone Encounter (Signed)
Schedule ICD change out

## 2012-09-16 NOTE — Telephone Encounter (Signed)
Pt was given several dates to review with wife He will call us back when he decides on date for ICD change out

## 2012-09-17 NOTE — Telephone Encounter (Signed)
Verbal instructions given to pt re: ICD change out 2/21 Pt says he had labs done 1/28 at Dr. Marlynn Perking office  We will call to get these results prior to [facility] Understanding verb

## 2012-09-17 NOTE — Telephone Encounter (Signed)
Pt calling to give a date for procedure. He would like February 21st. Please call pt back with instruction.

## 2012-09-24 ENCOUNTER — Encounter (HOSPITAL_COMMUNITY): Payer: Self-pay | Admitting: Pharmacy Technician

## 2012-10-02 ENCOUNTER — Other Ambulatory Visit: Payer: Self-pay

## 2012-10-05 ENCOUNTER — Other Ambulatory Visit: Payer: Self-pay

## 2012-10-05 ENCOUNTER — Telehealth: Payer: Self-pay

## 2012-10-05 DIAGNOSIS — I429 Cardiomyopathy, unspecified: Secondary | ICD-10-CM

## 2012-10-05 NOTE — Telephone Encounter (Signed)
Pt called back He will come in tomm for CBC and INR

## 2012-10-05 NOTE — Telephone Encounter (Signed)
LMTCB re: need to have CBC and INR checked prior to ICD gen change 2/21

## 2012-10-06 ENCOUNTER — Ambulatory Visit (INDEPENDENT_AMBULATORY_CARE_PROVIDER_SITE_OTHER): Payer: BC Managed Care – PPO

## 2012-10-06 DIAGNOSIS — Z01818 Encounter for other preprocedural examination: Secondary | ICD-10-CM

## 2012-10-06 DIAGNOSIS — I429 Cardiomyopathy, unspecified: Secondary | ICD-10-CM

## 2012-10-06 DIAGNOSIS — I428 Other cardiomyopathies: Secondary | ICD-10-CM

## 2012-10-06 DIAGNOSIS — Z5181 Encounter for therapeutic drug level monitoring: Secondary | ICD-10-CM

## 2012-10-06 DIAGNOSIS — N289 Disorder of kidney and ureter, unspecified: Secondary | ICD-10-CM

## 2012-10-07 LAB — PROTIME-INR

## 2012-10-07 MED ORDER — CEFAZOLIN SODIUM-DEXTROSE 2-3 GM-% IV SOLR
2.0000 g | INTRAVENOUS | Status: DC
  Filled 2012-10-07: qty 50

## 2012-10-07 MED ORDER — SODIUM CHLORIDE 0.9 % IR SOLN
80.0000 mg | Status: DC
  Filled 2012-10-07: qty 2

## 2012-10-07 MED ORDER — SODIUM CHLORIDE 0.45 % IV SOLN
INTRAVENOUS | Status: DC
  Administered 2012-10-08: 07:00:00 via INTRAVENOUS

## 2012-10-08 ENCOUNTER — Ambulatory Visit (HOSPITAL_COMMUNITY)
Admission: RE | Admit: 2012-10-08 | Discharge: 2012-10-08 | Disposition: A | Discharge status: Home / Self Care | Payer: BC Managed Care – PPO | Source: Ambulatory Visit | Attending: Internal Medicine | Admitting: Internal Medicine

## 2012-10-08 ENCOUNTER — Encounter (HOSPITAL_COMMUNITY)
Admission: RE | Disposition: A | Discharge status: Home / Self Care | Payer: Self-pay | Source: Ambulatory Visit | Attending: Internal Medicine

## 2012-10-08 DIAGNOSIS — E785 Hyperlipidemia, unspecified: Secondary | ICD-10-CM | POA: Insufficient documentation

## 2012-10-08 DIAGNOSIS — I509 Heart failure, unspecified: Secondary | ICD-10-CM | POA: Insufficient documentation

## 2012-10-08 DIAGNOSIS — I10 Essential (primary) hypertension: Secondary | ICD-10-CM | POA: Insufficient documentation

## 2012-10-08 DIAGNOSIS — Z4502 Encounter for adjustment and management of automatic implantable cardiac defibrillator: Secondary | ICD-10-CM | POA: Insufficient documentation

## 2012-10-08 DIAGNOSIS — I5022 Chronic systolic (congestive) heart failure: Secondary | ICD-10-CM | POA: Insufficient documentation

## 2012-10-08 DIAGNOSIS — I428 Other cardiomyopathies: Secondary | ICD-10-CM | POA: Insufficient documentation

## 2012-10-08 DIAGNOSIS — M810 Age-related osteoporosis without current pathological fracture: Secondary | ICD-10-CM | POA: Insufficient documentation

## 2012-10-08 HISTORY — PX: BIV ICD GENERTAOR CHANGE OUT: SHX5745

## 2012-10-08 LAB — BASIC METABOLIC PANEL
BUN: 15 mg/dL (ref 6–23)
CO2: 27 mEq/L (ref 19–32)
Calcium: 8.9 mg/dL (ref 8.4–10.5)
Chloride: 105 mEq/L (ref 96–112)
Creatinine, Ser: 1.36 mg/dL — ABNORMAL HIGH (ref 0.50–1.35)
GFR calc Af Amer: 61 mL/min — ABNORMAL LOW (ref >90)
Potassium: 3.9 mEq/L (ref 3.5–5.1)
Sodium: 141 mEq/L (ref 135–145)

## 2012-10-08 LAB — PROTIME-INR: INR: 0.9 (ref 0.00–1.49)

## 2012-10-08 LAB — SURGICAL PCR SCREEN: MRSA, PCR: NEGATIVE

## 2012-10-08 SURGERY — BIV ICD GENERTAOR CHANGE OUT
Anesthesia: LOCAL

## 2012-10-08 MED ORDER — ONDANSETRON HCL 4 MG/2ML IJ SOLN: 4.0000 mg | Freq: Four times a day (QID) | INTRAMUSCULAR | Status: DC | PRN

## 2012-10-08 MED ORDER — FENTANYL CITRATE 0.05 MG/ML IJ SOLN
INTRAMUSCULAR | Status: AC
  Filled 2012-10-08: qty 2

## 2012-10-08 MED ORDER — MUPIROCIN 2 % EX OINT
TOPICAL_OINTMENT | Freq: Two times a day (BID) | CUTANEOUS | Status: DC
  Administered 2012-10-08: 07:00:00 via NASAL

## 2012-10-08 MED ORDER — CEFAZOLIN SODIUM-DEXTROSE 2-3 GM-% IV SOLR
INTRAVENOUS | Status: AC
  Filled 2012-10-08: qty 50

## 2012-10-08 MED ORDER — CHLORHEXIDINE GLUCONATE 4 % EX LIQD
60.0000 mL | Freq: Once | CUTANEOUS | Status: DC
  Filled 2012-10-08: qty 60

## 2012-10-08 MED ORDER — ACETAMINOPHEN 325 MG PO TABS
325.0000 mg | ORAL_TABLET | ORAL | Status: DC | PRN
  Filled 2012-10-08: qty 2

## 2012-10-08 MED ORDER — LIDOCAINE HCL (PF) 1 % IJ SOLN
INTRAMUSCULAR | Status: AC
  Filled 2012-10-08: qty 60

## 2012-10-08 MED ORDER — MUPIROCIN 2 % EX OINT
TOPICAL_OINTMENT | CUTANEOUS | Status: AC
  Filled 2012-10-08: qty 22

## 2012-10-08 MED ORDER — MIDAZOLAM HCL 5 MG/5ML IJ SOLN
INTRAMUSCULAR | Status: AC
  Filled 2012-10-08: qty 5

## 2012-10-08 NOTE — H&P (View-Only) (Signed)
HPI [name] returns today for followup. He is a very pleasant 67-year-old man with a nonischemic cardiomyopathy, chronic systolic heart failure, obesity, status post ICD implantation. The patient has had intermittent improvements as well as worsening of his left ventricular dysfunction over the past 5 years. At baseline, he has very dense ventricular ectopy which seems to be related to worsening left ventricular function. Without the ectopy, his left ventricular function typically will nearly normalized. Several months ago, his left ventricular function had gone back down to 30% range in the setting of worsening ventricular ectopy. His amiodarone was reinitiated and repeat 2-D echo demonstrated near normalization of his left ventricular function. His ICD has now reached elective replacement. Allergies  Allergen Reactions  . Ace Inhibitors   . Aspirin   . Nsaids      Current Outpatient Prescriptions  Medication Sig Dispense Refill  . ALPRAZolam (XANAX) 0.25 MG tablet Take 0.25 mg by mouth at bedtime as needed.        . amiodarone (PACERONE) 200 MG tablet Take 1 tablet (200 mg total) by mouth daily.  30 tablet  0  . amLODipine (NORVASC) 5 MG tablet Take 1 tablet (5 mg total) by mouth daily.  90 tablet  4  . atorvastatin (LIPITOR) 40 MG tablet Take 0.5 tablets (20 mg total) by mouth daily.  90 tablet  3  . carvedilol (COREG) 25 MG tablet 2 tabs po bid as directed  360 tablet  3  . EPIPEN 2-PAK 0.3 MG/0.3ML DEVI as needed.      . fexofenadine-pseudoephedrine (ALLEGRA-D ALLERGY & CONGESTION) 180-240 MG per 24 hr tablet Take 1 tablet by mouth as needed.       . furosemide (LASIX) 20 MG tablet Take 1 tablet (20 mg total) by mouth daily. Take an extra 20mg daily as needed  90 tablet  3  . isosorbide mononitrate (IMDUR) 30 MG 24 hr tablet Take 60 mg by mouth daily.      . magnesium gluconate (MAGONATE) 500 MG tablet 1 tab po qd       . potassium chloride SA (KLOR-CON M20) 20 MEQ tablet Take 1/2  (half) tablet daily  90 tablet  3     Past Medical History  Diagnosis Date  . CHF (congestive heart failure)   . Cardiomyopathy   . PVC (premature ventricular contraction)   . Hyperlipidemia   . HTN (hypertension)   . Osteoporosis   . Abnormal chest CT     ROS:   All systems reviewed and negative except as noted in the HPI.   Past Surgical History  Procedure Date  . Knee arthroplasty     Total left knee 2006  . Knee arthroplasty     Total right knee x2  . Implantation of dual-chamber icd.   . Replacement total knee     right     Family History  Problem Relation Age of Onset  . Lung cancer    . Heart attack       History   Social History  . Marital Status: Married    Spouse Name: N/A    Number of Children: N/A  . Years of Education: N/A   Occupational History  . Not on file.   Social History Main Topics  . Smoking status: Former Smoker -- 1.0 packs/day for 10 years    Types: Cigarettes    Quit date: 12/31/1970  . Smokeless tobacco: Never Used  . Alcohol Use: Yes  . Drug Use: No  .   Sexually Active: Not on file   Other Topics Concern  . Not on file   Social History Narrative  . No narrative on file     BP 150/80  Ht 5' 9" (1.753 m)  Wt 224 lb 8 oz (101.833 kg)  BMI 33.15 kg/m2  Physical Exam:  Well appearing obese, 67-year-old man, NAD HEENT: Unremarkable Neck:  7 cm JVD, no thyromegally Lungs:  Clear HEART:  IRegular rate rhythm, no murmurs, no rubs, no clicks Abd:  soft, obese, positive bowel sounds, no organomegally, no rebound, no guarding Ext:  2 plus pulses, no edema, no cyanosis, no clubbing Skin:  No rashes no nodules Neuro:  CN II through XII intact, motor grossly intact   DEVICE  Normal device function.  See PaceArt for details. Device has reverted to a reset mode.  Assess/Plan:  

## 2012-10-08 NOTE — Op Note (Signed)
EP Procedure Note  Procedure: ICD generator removal Indication: Device at ERI and LV function has normalized Findings: after informed consent was obtained, the patient was prepped and draped in the usual fashion. IV fentanyl and versed were given for sedation. 30 cc of lidocaine was infiltrated in the SQ tissue over the ICD incision. A  7 cm incision was carried out over the old scar and electrocautery utilized to dissect down to the ICD pocket. The [employer] ICD was removed. The leads were capped.  Anti-biotic irrigation was used to irrigate the pocket and electrocautery utilized for hemostasis. The incision was closed with 2-0 and 3-0 vicryl suture.  Leonia Reeves.D.

## 2012-10-08 NOTE — Interval H&P Note (Signed)
History and Physical Interval Note:  10/08/2012 7:38 AM  [name]  has presented today for surgery, with the diagnosis of end of life battery  The various methods of treatment have been discussed with the patient and family. After consideration of risks, benefits and other options for treatment, the patient has consented to  ICD generator removal as a surgical intervention .  The patient's history has been reviewed, patient examined, no change in status, stable for surgery.  I have reviewed the patient's chart and labs.  Questions were answered to the patient's satisfaction.     Lewayne Bunting

## 2012-10-29 ENCOUNTER — Encounter: Payer: Self-pay | Admitting: *Deleted

## 2013-02-16 ENCOUNTER — Other Ambulatory Visit: Payer: Self-pay | Admitting: *Deleted

## 2013-02-16 DIAGNOSIS — I4949 Other premature depolarization: Secondary | ICD-10-CM

## 2013-02-16 MED ORDER — AMIODARONE HCL 200 MG PO TABS: 200.0000 mg | ORAL_TABLET | Freq: Every day | ORAL | Status: DC

## 2013-02-16 MED ORDER — AMLODIPINE BESYLATE 5 MG PO TABS: 5.0000 mg | ORAL_TABLET | Freq: Every day | ORAL | Status: DC

## 2013-03-21 ENCOUNTER — Ambulatory Visit (INDEPENDENT_AMBULATORY_CARE_PROVIDER_SITE_OTHER)
Admission: RE | Admit: 2013-03-21 | Discharge: 2013-03-21 | Disposition: A | Discharge status: Home / Self Care | Payer: BC Managed Care – PPO | Source: Ambulatory Visit | Attending: Cardiology | Admitting: Cardiology

## 2013-03-21 ENCOUNTER — Encounter: Payer: Self-pay | Admitting: Cardiology

## 2013-03-21 ENCOUNTER — Ambulatory Visit (INDEPENDENT_AMBULATORY_CARE_PROVIDER_SITE_OTHER): Payer: BC Managed Care – PPO | Admitting: Cardiology

## 2013-03-21 VITALS — BP 155/93 | HR 63 | Wt 230.0 lb

## 2013-03-21 DIAGNOSIS — I428 Other cardiomyopathies: Secondary | ICD-10-CM

## 2013-03-21 DIAGNOSIS — I4949 Other premature depolarization: Secondary | ICD-10-CM

## 2013-03-21 DIAGNOSIS — I10 Essential (primary) hypertension: Secondary | ICD-10-CM

## 2013-03-21 DIAGNOSIS — I5022 Chronic systolic (congestive) heart failure: Secondary | ICD-10-CM

## 2013-03-21 DIAGNOSIS — E785 Hyperlipidemia, unspecified: Secondary | ICD-10-CM

## 2013-03-21 LAB — HEPATIC FUNCTION PANEL
AST: 19 U/L (ref 0–37)
Albumin: 4.1 g/dL (ref 3.5–5.2)
Alkaline Phosphatase: 59 U/L (ref 39–117)
Total Bilirubin: 1.2 mg/dL (ref 0.3–1.2)

## 2013-03-21 LAB — BASIC METABOLIC PANEL
CO2: 26 mEq/L (ref 19–32)
Calcium: 9.3 mg/dL (ref 8.4–10.5)
Creatinine, Ser: 1.5 mg/dL (ref 0.4–1.5)
GFR: 49.46 mL/min — ABNORMAL LOW (ref >60.00)
Sodium: 140 mEq/L (ref 135–145)

## 2013-03-21 LAB — TSH: TSH: 2.15 u[IU]/mL (ref 0.35–5.50)

## 2013-03-21 MED ORDER — POTASSIUM CHLORIDE CRYS ER 20 MEQ PO TBCR: 10.0000 meq | EXTENDED_RELEASE_TABLET | Freq: Every day | ORAL | Status: DC

## 2013-03-21 MED ORDER — CARVEDILOL 25 MG PO TABS: 25.0000 mg | ORAL_TABLET | Freq: Two times a day (BID) | ORAL | Status: DC

## 2013-03-21 MED ORDER — ISOSORBIDE MONONITRATE ER 30 MG PO TB24: 30.0000 mg | ORAL_TABLET | Freq: Every day | ORAL | Status: DC

## 2013-03-21 MED ORDER — ATORVASTATIN CALCIUM 40 MG PO TABS: 20.0000 mg | ORAL_TABLET | Freq: Every day | ORAL | Status: DC

## 2013-03-21 MED ORDER — AMLODIPINE BESYLATE 5 MG PO TABS: 5.0000 mg | ORAL_TABLET | Freq: Every day | ORAL | Status: DC

## 2013-03-21 MED ORDER — FUROSEMIDE 20 MG PO TABS: 20.0000 mg | ORAL_TABLET | Freq: Every day | ORAL | Status: DC

## 2013-03-21 MED ORDER — AMIODARONE HCL 200 MG PO TABS: 200.0000 mg | ORAL_TABLET | Freq: Every day | ORAL | Status: DC

## 2013-03-21 NOTE — Patient Instructions (Addendum)
Your physician wants you to follow-up in: 6 MONTHS WITH DR Jens Som You will receive a reminder letter in the mail two months in advance. If you don't receive a letter, please call our office to schedule the follow-up appointment.   Your physician recommends that you HAVE LAB WORK TODAY  A chest x-ray takes a picture of the organs and structures inside the chest, including the heart, lungs, and blood vessels. This test can show several things, including, whether the heart is enlarges; whether fluid is building up in the lungs; and whether pacemaker / defibrillator leads are still in place. AT [address] OFFICE  DECREASE ISOSORBIDE TO 30 MG ONCE DAILY

## 2013-03-21 NOTE — Assessment & Plan Note (Signed)
Continue amiodarone. 

## 2013-03-21 NOTE — Progress Notes (Signed)
HPI: [name] is a pleasant gentleman, who has a history of nonischemic cardiomyopathy improved for fu. 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 Previous holter monitor in Dec 2012 showed frequent PVCs and nonsustained ventricular tachycardia. Fu echo in July 2013 showed improved EF 45, trace AI/MR, aortic root 45 mm, mild LAE. CTA August 2013 showed aortic root 3.8 cm (no aneurysm). He also has had a previous ICD (generator removed in February of 2014 as his device had reached ERI and his LV function had improved). I last saw him in Jan 2014. Since that time, he denies dyspnea, chest pain, palpitations or syncope. He has noticed some dizziness in the morning after taking his medications.   Current Outpatient Prescriptions  Medication Sig Dispense Refill  . ALPRAZolam (XANAX) 0.25 MG tablet Take 0.25 mg by mouth at bedtime as needed. For sleep      . amiodarone (PACERONE) 200 MG tablet Take 1 tablet (200 mg total) by mouth daily.  90 tablet  2  . amLODipine (NORVASC) 5 MG tablet Take 1 tablet (5 mg total) by mouth daily.  90 tablet  2  . atorvastatin (LIPITOR) 40 MG tablet Take 0.5 tablets (20 mg total) by mouth daily.  90 tablet  3  . carvedilol (COREG) 25 MG tablet Take 1 tablet (25 mg total) by mouth 2 (two) times daily with a meal.  180 tablet  2  . EPIPEN 2-PAK 0.3 MG/0.3ML DEVI Inject 0.3 mg into the skin daily as needed. For anaphylaxis      . fexofenadine-pseudoephedrine (ALLEGRA-D ALLERGY & CONGESTION) 180-240 MG per 24 hr tablet Take 1 tablet by mouth daily as needed. For seasonal allergies      . furosemide (LASIX) 20 MG tablet Take 1 tablet (20 mg total) by mouth daily. Take an extra 20mg  daily as needed  90 tablet  3  . isosorbide mononitrate (IMDUR) 30 MG 24 hr tablet Take 60 mg by mouth daily.      . montelukast (SINGULAIR) 10 MG tablet Take 10 mg by mouth Twice daily.      . potassium chloride SA  (K-DUR,KLOR-CON) 20 MEQ tablet Take 0.5 tablets (10 mEq total) by mouth daily.  90 tablet  2   No current facility-administered medications for this visit.     Past Medical History  Diagnosis Date  . CHF (congestive heart failure)   . Cardiomyopathy   . PVC (premature ventricular contraction)   . Hyperlipidemia   . HTN (hypertension)   . Osteoporosis   . Abnormal chest CT     Past Surgical History  Procedure Laterality Date  . Knee arthroplasty      Total left knee 2006  . Knee arthroplasty      Total right knee x2  . Implantation of dual-chamber icd.    . Replacement total knee      right    History   Social History  . Marital Status: Married    Spouse Name: N/A    Number of Children: N/A  . Years of Education: N/A   Occupational History  . Not on file.   Social History Main Topics  . Smoking status: Former Smoker -- 1.00 packs/day for 10 years    Types: Cigarettes    Quit date: 12/31/1970  . Smokeless tobacco: Never Used  . Alcohol Use: Yes  . Drug Use: No  . Sexually Active: Not on file  Other Topics Concern  . Not on file   Social History Narrative  . No narrative on file    ROS: no fevers or chills, productive cough, hemoptysis, dysphasia, odynophagia, melena, hematochezia, dysuria, hematuria, rash, seizure activity, orthopnea, PND, pedal edema, claudication. Remaining systems are negative.  Physical Exam: Well-developed well-nourished in no acute distress.  Skin is warm and dry.  HEENT is normal.  Neck is supple.  Chest is clear to auscultation with normal expansion.  Cardiovascular exam is regular rate and rhythm.  Abdominal exam nontender or distended. No masses palpated. Extremities show no edema. neuro grossly intact  ECG sinus rhythm with first degree AV block. Cannot rule out prior inferior infarct. Prolonged QT interval.

## 2013-03-21 NOTE — Assessment & Plan Note (Signed)
ICD generator has been removed as his LV function improved.

## 2013-03-21 NOTE — Assessment & Plan Note (Signed)
Blood pressure is mildly elevated but he checks this frequently at home and it is typically controlled. He is having dizziness after taking a.m. medications. Decrease Imdur to 30 mg daily.

## 2013-03-21 NOTE — Assessment & Plan Note (Signed)
Continue beta blocker. Continue amiodarone as previous cardiomyopathy felt related to frequent ectopy. Check TSH, liver functions and chest x-ray. Note he has an allergy to hydralazine and ACE inhibitors.

## 2013-03-21 NOTE — Assessment & Plan Note (Signed)
Continue statin. 

## 2013-03-21 NOTE — Assessment & Plan Note (Signed)
euvolemic on examination. Continue present dose of diuretics. Check potassium and renal function. 

## 2013-03-22 ENCOUNTER — Telehealth: Payer: Self-pay | Admitting: Cardiology

## 2013-03-22 NOTE — Telephone Encounter (Signed)
New Prob   Pt calling for test results. Xray and lab

## 2013-03-22 NOTE — Telephone Encounter (Signed)
LMTCB@9 :55am

## 2013-03-22 NOTE — Telephone Encounter (Signed)
Patient provided results of XRay and labs from 03/21/13. Patient had no further questions or concerns. Voiced appreciation of return phone call.

## 2013-03-23 ENCOUNTER — Other Ambulatory Visit: Payer: Self-pay

## 2013-03-24 ENCOUNTER — Encounter: Payer: Self-pay | Admitting: Cardiology

## 2013-03-24 DIAGNOSIS — I4949 Other premature depolarization: Secondary | ICD-10-CM

## 2013-03-24 MED ORDER — CARVEDILOL 25 MG PO TABS: 50.0000 mg | ORAL_TABLET | Freq: Two times a day (BID) | ORAL | Status: DC

## 2013-06-23 ENCOUNTER — Other Ambulatory Visit: Payer: Self-pay

## 2013-08-29 ENCOUNTER — Ambulatory Visit (INDEPENDENT_AMBULATORY_CARE_PROVIDER_SITE_OTHER): Payer: BC Managed Care – PPO | Admitting: Cardiology

## 2013-08-29 ENCOUNTER — Encounter: Payer: Self-pay | Admitting: Cardiology

## 2013-08-29 VITALS — BP 138/76 | HR 60 | Ht 69.0 in | Wt 238.0 lb

## 2013-08-29 DIAGNOSIS — I4891 Unspecified atrial fibrillation: Secondary | ICD-10-CM

## 2013-08-29 DIAGNOSIS — I428 Other cardiomyopathies: Secondary | ICD-10-CM

## 2013-08-29 DIAGNOSIS — R0989 Other specified symptoms and signs involving the circulatory and respiratory systems: Secondary | ICD-10-CM

## 2013-08-29 LAB — HEPATIC FUNCTION PANEL
ALT: 39 U/L (ref 0–53)
AST: 29 U/L (ref 0–37)
Albumin: 3.5 g/dL (ref 3.5–5.2)
Alkaline Phosphatase: 51 U/L (ref 39–117)
BILIRUBIN TOTAL: 0.4 mg/dL (ref 0.3–1.2)
Bilirubin, Direct: 0 mg/dL (ref 0.0–0.3)
TOTAL PROTEIN: 6.9 g/dL (ref 6.0–8.3)

## 2013-08-29 LAB — BRAIN NATRIURETIC PEPTIDE: PRO B NATRI PEPTIDE: 195 pg/mL — AB (ref 0.0–100.0)

## 2013-08-29 LAB — BASIC METABOLIC PANEL
BUN: 18 mg/dL (ref 6–23)
CHLORIDE: 106 meq/L (ref 96–112)
CO2: 27 mEq/L (ref 19–32)
Calcium: 8.7 mg/dL (ref 8.4–10.5)
Creatinine, Ser: 1.8 mg/dL — ABNORMAL HIGH (ref 0.4–1.5)
GFR: 40.07 mL/min — AB (ref >60.00)
Glucose, Bld: 94 mg/dL (ref 70–99)
POTASSIUM: 4.2 meq/L (ref 3.5–5.1)
SODIUM: 141 meq/L (ref 135–145)

## 2013-08-29 LAB — TSH: TSH: 0.92 u[IU]/mL (ref 0.35–5.50)

## 2013-08-29 NOTE — Assessment & Plan Note (Signed)
Blood pressure controlled. Continue present medications. Check potassium and renal function. 

## 2013-08-29 NOTE — Assessment & Plan Note (Signed)
Improved on most recent echocardiogram. Plan repeat study in 6 months. Felt related to PVCs. Continue amiodarone.

## 2013-08-29 NOTE — Patient Instructions (Signed)
Your physician wants you to follow-up in: 6 MONTHS WITH DR Jens Som You will receive a reminder letter in the mail two months in advance. If you don't receive a letter, please call our office to schedule the follow-up appointment.   Your physician recommends that you HAVE LAB WORK TODAY  A chest x-ray takes a picture of the organs and structures inside the chest, including the heart, lungs, and blood vessels. This test can show several things, including, whether the heart is enlarges; whether fluid is building up in the lungs; and whether pacemaker / defibrillator leads are still in place. AT [address] OFFICE IN THE BASEMENT  Your physician has requested that you have an abdominal aorta duplex. During this test, an ultrasound is used to evaluate the aorta. Allow 30 minutes for this exam. Do not eat after midnight the day before and avoid carbonated beverages

## 2013-08-29 NOTE — Assessment & Plan Note (Signed)
Schedule abdominal ultrasound to exclude aneurysm. 

## 2013-08-29 NOTE — Assessment & Plan Note (Signed)
Continue amiodarone. Check TSH, liver functions and chest x-ray. 

## 2013-08-29 NOTE — Assessment & Plan Note (Signed)
Continue statin. 

## 2013-08-29 NOTE — Assessment & Plan Note (Signed)
Generator removed as LV function improved.

## 2013-08-29 NOTE — Progress Notes (Signed)
HPI: [name] is a pleasant gentleman, who has a history of nonischemic cardiomyopathy improved for fu. 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 Previous holter monitor in Dec 2012 showed frequent PVCs and nonsustained ventricular tachycardia. Fu echo in July 2013 showed improved EF 45, trace AI/MR, aortic root 45 mm, mild LAE. CTA August 2013 showed aortic root 3.8 cm (no aneurysm). He also has had a previous ICD (generator removed in February of 2014 as his device had reached ERI and his LV function had improved). I last saw him in August 2014. Since that time, he denies dyspnea, chest pain, palpitations or syncope. He has noticed some wheezing with lying down at night. He has some reflux as well.   Current Outpatient Prescriptions  Medication Sig Dispense Refill  . ALPRAZolam (XANAX) 0.25 MG tablet Take 0.25 mg by mouth at bedtime as needed. For sleep      . amiodarone (PACERONE) 200 MG tablet Take 1 tablet (200 mg total) by mouth daily.  90 tablet  2  . amLODipine (NORVASC) 5 MG tablet Take 1 tablet (5 mg total) by mouth daily.  90 tablet  2  . atorvastatin (LIPITOR) 40 MG tablet Take 0.5 tablets (20 mg total) by mouth daily.  90 tablet  3  . carvedilol (COREG) 25 MG tablet Take 2 tablets (50 mg total) by mouth 2 (two) times daily with a meal.  180 tablet  2  . EPIPEN 2-PAK 0.3 MG/0.3ML DEVI Inject 0.3 mg into the skin daily as needed. For anaphylaxis      . fexofenadine-pseudoephedrine (ALLEGRA-D ALLERGY & CONGESTION) 180-240 MG per 24 hr tablet Take 1 tablet by mouth daily as needed. For seasonal allergies      . furosemide (LASIX) 20 MG tablet Take 1 tablet (20 mg total) by mouth daily. Take an extra 20mg  daily as needed  90 tablet  3  . isosorbide mononitrate (IMDUR) 30 MG 24 hr tablet Take 1 tablet (30 mg total) by mouth daily.      . Magnesium 250 MG TABS Take by mouth.      . montelukast (SINGULAIR)  10 MG tablet Take 10 mg by mouth Twice daily.      . potassium chloride SA (K-DUR,KLOR-CON) 20 MEQ tablet Take 0.5 tablets (10 mEq total) by mouth daily.  90 tablet  2  . ranitidine (ZANTAC) 150 MG capsule Take 150 mg by mouth 2 (two) times daily.      Marland Kitchen sulfamethoxazole-trimethoprim (BACTRIM DS) 800-160 MG per tablet        No current facility-administered medications for this visit.     Past Medical History  Diagnosis Date  . CHF (congestive heart failure)   . Cardiomyopathy   . PVC (premature ventricular contraction)   . Hyperlipidemia   . HTN (hypertension)   . Osteoporosis   . Abnormal chest CT     Past Surgical History  Procedure Laterality Date  . Knee arthroplasty      Total left knee 2006  . Knee arthroplasty      Total right knee x2  . Implantation of dual-chamber icd.    . Replacement total knee      right    History   Social History  . Marital Status: Married    Spouse Name: N/A    Number of Children: N/A  . Years of Education: N/A   Occupational History  .  Not on file.   Social History Main Topics  . Smoking status: Former Smoker -- 1.00 packs/day for 10 years    Types: Cigarettes    Quit date: 12/31/1970  . Smokeless tobacco: Never Used  . Alcohol Use: Yes  . Drug Use: No  . Sexual Activity: Not on file   Other Topics Concern  . Not on file   Social History Narrative  . No narrative on file    ROS: no fevers or chills, productive cough, hemoptysis, dysphasia, odynophagia, melena, hematochezia, dysuria, hematuria, rash, seizure activity, orthopnea, PND, pedal edema, claudication. Remaining systems are negative.  Physical Exam: Well-developed well-nourished in no acute distress.  Skin is warm and dry.  HEENT is normal.  Neck is supple.  Chest is clear to auscultation with normal expansion.  Cardiovascular exam is regular rate and rhythm.  Abdominal exam nontender or distended. No masses palpated. Extremities show no edema. neuro grossly  intact  ECG sinus rhythm with first degree AV block. Nonspecific ST changes.

## 2013-08-29 NOTE — Assessment & Plan Note (Signed)
Patient appears to be euvolemic on examination. He has some wheezing with lying flat at night. This may be related to reflux but could also be orthopnea. Check BNP. If elevated increase Lasix.

## 2013-09-01 ENCOUNTER — Ambulatory Visit (HOSPITAL_COMMUNITY): Payer: BC Managed Care – PPO | Attending: Cardiology

## 2013-09-01 ENCOUNTER — Ambulatory Visit (INDEPENDENT_AMBULATORY_CARE_PROVIDER_SITE_OTHER)
Admission: RE | Admit: 2013-09-01 | Discharge: 2013-09-01 | Disposition: A | Discharge status: Home / Self Care | Payer: BC Managed Care – PPO | Source: Ambulatory Visit | Attending: Cardiology | Admitting: Cardiology

## 2013-09-01 DIAGNOSIS — Z87891 Personal history of nicotine dependence: Secondary | ICD-10-CM | POA: Insufficient documentation

## 2013-09-01 DIAGNOSIS — I10 Essential (primary) hypertension: Secondary | ICD-10-CM | POA: Insufficient documentation

## 2013-09-01 DIAGNOSIS — E785 Hyperlipidemia, unspecified: Secondary | ICD-10-CM | POA: Insufficient documentation

## 2013-09-01 DIAGNOSIS — I428 Other cardiomyopathies: Secondary | ICD-10-CM

## 2013-09-01 DIAGNOSIS — R0989 Other specified symptoms and signs involving the circulatory and respiratory systems: Secondary | ICD-10-CM | POA: Insufficient documentation

## 2013-09-19 ENCOUNTER — Ambulatory Visit: Payer: Self-pay | Admitting: Unknown Physician Specialty

## 2013-09-22 LAB — PATHOLOGY REPORT

## 2013-09-30 ENCOUNTER — Encounter: Payer: Self-pay | Admitting: Internal Medicine

## 2013-10-07 ENCOUNTER — Encounter: Payer: Self-pay | Admitting: Internal Medicine

## 2013-11-15 ENCOUNTER — Ambulatory Visit (INDEPENDENT_AMBULATORY_CARE_PROVIDER_SITE_OTHER): Payer: BC Managed Care – PPO | Admitting: Internal Medicine

## 2013-11-15 ENCOUNTER — Encounter: Payer: Self-pay | Admitting: Internal Medicine

## 2013-11-15 VITALS — BP 152/88 | HR 65 | Ht 67.0 in | Wt 233.8 lb

## 2013-11-15 DIAGNOSIS — I4949 Other premature depolarization: Secondary | ICD-10-CM

## 2013-11-15 DIAGNOSIS — I4891 Unspecified atrial fibrillation: Secondary | ICD-10-CM

## 2013-11-15 MED ORDER — CARVEDILOL 25 MG PO TABS: 50.0000 mg | ORAL_TABLET | Freq: Two times a day (BID) | ORAL | Status: DC

## 2013-11-15 MED ORDER — AMIODARONE HCL 200 MG PO TABS: 200.0000 mg | ORAL_TABLET | Freq: Every day | ORAL | Status: DC

## 2013-11-15 NOTE — Assessment & Plan Note (Signed)
His ventricular ectopy has been stable on amiodarone. He will continue his amio for now as we do not have any evidence of amio lung toxicity. I have asked the patient to monitor his symptoms and to let us know if he experiences cough, fever, or worsening sob.

## 2013-11-15 NOTE — Patient Instructions (Signed)
Your physician recommends that you schedule a follow-up appointment as needed  

## 2013-11-15 NOTE — Progress Notes (Signed)
HPI [name] returns today for followup. He is a very pleasant 68 year old man with a nonischemic cardiomyopathy, chronic systolic heart failure, obesity, status post ICD implantation. The patient has had intermittent improvements as well as worsening of his left ventricular dysfunction over the past 5 years. At baseline, he has very dense ventricular ectopy which was resolved with amiodarone. Most recently his EF has normalized and we decided not to replace his ICD. He developed some cough and has undergone evaluation for amio lung toxicity which has been inconclusive. He does not cough daily and has minimal dyspnea. No fever or chills or malaise. He had PFT's which were unremarkable though I did not see a DLCO and I cannot find an ESR. He is scheduled to followup with Dr. [name] in 3 months. Allergies  Allergen Reactions  . Ace Inhibitors Other (See Comments)    Angioedema  . Aspirin   . Indocin [Indomethacin]   . Mobic [Meloxicam]   . Nsaids      Current Outpatient Prescriptions  Medication Sig Dispense Refill  . acetaminophen (TYLENOL) 325 MG tablet Take 325 mg by mouth every 6 (six) hours as needed.      Marland Kitchen albuterol (PROVENTIL HFA;VENTOLIN HFA) 108 (90 BASE) MCG/ACT inhaler Inhale 1 puff into the lungs every 6 (six) hours as needed for wheezing or shortness of breath.      . ALPRAZolam (XANAX) 0.25 MG tablet Take 0.25 mg by mouth at bedtime as needed. For sleep      . amiodarone (PACERONE) 200 MG tablet Take 200 mg by mouth daily.       Marland Kitchen amLODipine (NORVASC) 5 MG tablet Take 1 tablet (5 mg total) by mouth daily.  90 tablet  2  . atorvastatin (LIPITOR) 40 MG tablet Take 0.5 tablets (20 mg total) by mouth daily.  90 tablet  3  . budesonide (PULMICORT) 180 MCG/ACT inhaler Inhale 1 puff into the lungs daily.      . carvedilol (COREG) 25 MG tablet Take 2 tablets (50 mg total) by mouth 2 (two) times daily with a meal.  180 tablet  2  . EPIPEN 2-PAK 0.3 MG/0.3ML DEVI Inject 0.3 mg into  the skin daily as needed. For anaphylaxis      . fexofenadine-pseudoephedrine (ALLEGRA-D ALLERGY & CONGESTION) 180-240 MG per 24 hr tablet Take 1 tablet by mouth daily as needed. For seasonal allergies      . furosemide (LASIX) 20 MG tablet Take 1 tablet (20 mg total) by mouth daily. Take an extra 18m daily as needed  90 tablet  3  . isosorbide mononitrate (IMDUR) 30 MG 24 hr tablet Take 1 tablet (30 mg total) by mouth daily.      . Magnesium 250 MG TABS Take 1 tablet by mouth daily.       . montelukast (SINGULAIR) 10 MG tablet Take 10 mg by mouth daily.       . potassium chloride SA (K-DUR,KLOR-CON) 20 MEQ tablet Take 0.5 tablets (10 mEq total) by mouth daily.  90 tablet  2  . predniSONE (DELTASONE) 20 MG tablet Take 40 mg by mouth as needed.       . ranitidine (ZANTAC) 150 MG capsule Take 150 mg by mouth daily.        No current facility-administered medications for this visit.     Past Medical History  Diagnosis Date  . CHF (congestive heart failure)   . Cardiomyopathy   . PVC (premature ventricular contraction)   . Osteoporosis   .  Abnormal chest CT   . Asthma   . Benign hypertension   . Hyperlipidemia   . Osteoarthrosis, unspecified whether generalized or localized, lower leg   . Hives     Long Hx of hives  . Osteoarthritis   . 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 Psoriasis   . NSVT (nonsustained ventricular tachycardia)   . Chronic renal insufficiency     With creatinine 1.4    ROS:   All systems reviewed and negative except as noted in the HPI.   Past Surgical History  Procedure Laterality Date  . Knee arthroplasty Left 2006  . Knee arthroplasty Right 12/10    x2  . Implantation of dual-chamber icd.      [employer] Jude DDD-ICD 1/07  . Cardiac defibrillator removal  2014     Family History  Problem Relation Age of Onset  . Heart attack Father   . Lung cancer Father      History   Social History  . Marital Status: Married    Spouse Name: N/A     Number of Children: N/A  . Years of Education: N/A   Occupational History  . Not on file.   Social History Main Topics  . Smoking status: Former Smoker -- 1.00 packs/day for 10 years    Types: Cigarettes    Quit date: 12/31/1970  . Smokeless tobacco: Never Used  . Alcohol Use: Yes     Comment: 2 DRINKS PER DAY  . Drug Use: No  . Sexual Activity: Not on file   Other Topics Concern  . Not on file   Social History Narrative  . No narrative on file     BP 152/88  Pulse 65  Ht 5' 7" (1.702 m)  Wt 233 lb 12.8 oz (106.051 kg)  BMI 36.61 kg/m2  Physical Exam:  Well appearing obese, 68 year old man, NAD HEENT: Unremarkable Neck:  7 cm JVD, no thyromegally Lungs:  Clear HEART:  Regular rate rhythm, no murmurs, no rubs, no clicks Abd:  soft, obese, positive bowel sounds, no organomegally, no rebound, no guarding Ext:  2 plus pulses, no edema, no cyanosis, no clubbing Skin:  No rashes no nodules Neuro:  CN II through XII intact, motor grossly intact   Assess/Plan:

## 2013-11-18 ENCOUNTER — Other Ambulatory Visit: Payer: Self-pay

## 2013-11-18 DIAGNOSIS — I4949 Other premature depolarization: Secondary | ICD-10-CM

## 2013-11-18 MED ORDER — CARVEDILOL 25 MG PO TABS: 50.0000 mg | ORAL_TABLET | Freq: Two times a day (BID) | ORAL | Status: DC

## 2014-01-05 DIAGNOSIS — J45909 Unspecified asthma, uncomplicated: Secondary | ICD-10-CM | POA: Insufficient documentation

## 2014-01-24 ENCOUNTER — Ambulatory Visit: Payer: Self-pay | Admitting: Ophthalmology

## 2014-01-24 LAB — POTASSIUM: Potassium: 4 mmol/L (ref 3.5–5.1)

## 2014-02-13 ENCOUNTER — Ambulatory Visit: Payer: Self-pay | Admitting: Ophthalmology

## 2014-02-22 ENCOUNTER — Other Ambulatory Visit: Payer: Self-pay

## 2014-02-22 DIAGNOSIS — I4949 Other premature depolarization: Secondary | ICD-10-CM

## 2014-02-22 MED ORDER — AMLODIPINE BESYLATE 5 MG PO TABS: 5.0000 mg | ORAL_TABLET | Freq: Every day | ORAL | Status: DC

## 2014-03-09 ENCOUNTER — Emergency Department: Payer: Self-pay | Admitting: Emergency Medicine

## 2014-03-09 LAB — COMPREHENSIVE METABOLIC PANEL
ALBUMIN: 3.4 g/dL (ref 3.4–5.0)
Alkaline Phosphatase: 68 U/L
Anion Gap: 7 (ref 7–16)
BUN: 13 mg/dL (ref 7–18)
Bilirubin,Total: 1.2 mg/dL — ABNORMAL HIGH (ref 0.2–1.0)
Calcium, Total: 8.3 mg/dL — ABNORMAL LOW (ref 8.5–10.1)
Chloride: 107 mmol/L (ref 98–107)
Co2: 27 mmol/L (ref 21–32)
Creatinine: 1.53 mg/dL — ABNORMAL HIGH (ref 0.60–1.30)
EGFR (Non-African Amer.): 47 — ABNORMAL LOW
GFR CALC AF AMER: 54 — AB
Glucose: 87 mg/dL (ref 65–99)
Osmolality: 281 (ref 275–301)
POTASSIUM: 3.9 mmol/L (ref 3.5–5.1)
SGOT(AST): 20 U/L (ref 15–37)
SGPT (ALT): 32 U/L
SODIUM: 141 mmol/L (ref 136–145)
Total Protein: 7.4 g/dL (ref 6.4–8.2)

## 2014-03-09 LAB — CBC WITH DIFFERENTIAL/PLATELET
Basophil #: 0.1 10*3/uL (ref 0.0–0.1)
Basophil %: 1.1 %
Eosinophil #: 0.1 10*3/uL (ref 0.0–0.7)
Eosinophil %: 1.2 %
HCT: 41.8 % (ref 40.0–52.0)
HGB: 14.1 g/dL (ref 13.0–18.0)
Lymphocyte #: 1.4 10*3/uL (ref 1.0–3.6)
Lymphocyte %: 15.9 %
MCH: 31.7 pg (ref 26.0–34.0)
MCHC: 33.6 g/dL (ref 32.0–36.0)
MCV: 94 fL (ref 80–100)
MONOS PCT: 7.2 %
Monocyte #: 0.6 x10 3/mm (ref 0.2–1.0)
Neutrophil #: 6.5 10*3/uL (ref 1.4–6.5)
Neutrophil %: 74.6 %
Platelet: 189 10*3/uL (ref 150–440)
RBC: 4.44 10*6/uL (ref 4.40–5.90)
RDW: 14.9 % — ABNORMAL HIGH (ref 11.5–14.5)
WBC: 8.7 10*3/uL (ref 3.8–10.6)

## 2014-03-09 LAB — TROPONIN I: Troponin-I: <0.02 ng/mL

## 2014-03-09 LAB — TSH: THYROID STIMULATING HORM: 2.09 u[IU]/mL

## 2014-03-09 LAB — PRO B NATRIURETIC PEPTIDE: B-Type Natriuretic Peptide: 790 pg/mL — ABNORMAL HIGH (ref 0–125)

## 2014-04-12 ENCOUNTER — Encounter: Payer: Self-pay | Admitting: *Deleted

## 2014-04-12 ENCOUNTER — Ambulatory Visit (INDEPENDENT_AMBULATORY_CARE_PROVIDER_SITE_OTHER): Payer: Medicare Other | Admitting: Cardiology

## 2014-04-12 ENCOUNTER — Encounter: Payer: Self-pay | Admitting: Cardiology

## 2014-04-12 VITALS — BP 122/70 | HR 58 | Ht 69.0 in | Wt 235.9 lb

## 2014-04-12 DIAGNOSIS — I10 Essential (primary) hypertension: Secondary | ICD-10-CM

## 2014-04-12 DIAGNOSIS — I428 Other cardiomyopathies: Secondary | ICD-10-CM

## 2014-04-12 DIAGNOSIS — E785 Hyperlipidemia, unspecified: Secondary | ICD-10-CM

## 2014-04-12 LAB — HEPATIC FUNCTION PANEL
ALK PHOS: 54 U/L (ref 39–117)
ALT: 18 U/L (ref 0–53)
AST: 16 U/L (ref 0–37)
Albumin: 3.9 g/dL (ref 3.5–5.2)
Bilirubin, Direct: 0.1 mg/dL (ref 0.0–0.3)
Indirect Bilirubin: 0.6 mg/dL (ref 0.2–1.2)
TOTAL PROTEIN: 6.5 g/dL (ref 6.0–8.3)
Total Bilirubin: 0.7 mg/dL (ref 0.2–1.2)

## 2014-04-12 LAB — BASIC METABOLIC PANEL WITH GFR
BUN: 20 mg/dL (ref 6–23)
CO2: 29 meq/L (ref 19–32)
Calcium: 8.8 mg/dL (ref 8.4–10.5)
Chloride: 104 mEq/L (ref 96–112)
Creat: 1.41 mg/dL — ABNORMAL HIGH (ref 0.50–1.35)
GFR, EST AFRICAN AMERICAN: 60 mL/min
GFR, Est Non African American: 52 mL/min — ABNORMAL LOW
GLUCOSE: 82 mg/dL (ref 70–99)
POTASSIUM: 4.2 meq/L (ref 3.5–5.3)
SODIUM: 142 meq/L (ref 135–145)

## 2014-04-12 NOTE — Progress Notes (Signed)
HPI: FU nonischemic cardiomyopathy improved. 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 Previous holter monitor in Dec 2012 showed frequent PVCs and nonsustained ventricular tachycardia. Fu echo in July 2013 showed improved EF 45, trace AI/MR, aortic root 45 mm, mild LAE. CTA August 2013 showed aortic root 3.8 cm (no aneurysm). He also has had a previous ICD (generator removed in February of 2014 as his device had reached ERI and his LV function had improved). Abd ultrasound 1/15 showed no aneurysm. Since I last saw him. He has had intermittent dyspnea which resolves with asthma therapy. He now denies dyspnea on exertion, orthopnea, PND, pedal edema, syncope or chest pain. He was seen in the [facility] emergency room with dizziness for 2 hours. Apparently felt not to be cardiac related. No records available.   Current Outpatient Prescriptions  Medication Sig Dispense Refill  . acetaminophen (TYLENOL) 325 MG tablet Take 325 mg by mouth every 6 (six) hours as needed.      Marland Kitchen albuterol (PROVENTIL HFA;VENTOLIN HFA) 108 (90 BASE) MCG/ACT inhaler Inhale 1 puff into the lungs every 6 (six) hours as needed for wheezing or shortness of breath.      . ALPRAZolam (XANAX) 0.25 MG tablet Take 0.25 mg by mouth at bedtime as needed. For sleep      . amiodarone (PACERONE) 200 MG tablet Take 1 tablet (200 mg total) by mouth daily.  90 tablet  3  . amLODipine (NORVASC) 5 MG tablet Take 1 tablet (5 mg total) by mouth daily.  90 tablet  2  . atorvastatin (LIPITOR) 40 MG tablet Take 0.5 tablets (20 mg total) by mouth daily.  90 tablet  3  . budesonide (PULMICORT) 180 MCG/ACT inhaler Inhale 1 puff into the lungs daily.      . carvedilol (COREG) 25 MG tablet Take 2 tablets (50 mg total) by mouth 2 (two) times daily with a meal.  360 tablet  3  . EPIPEN 2-PAK 0.3 MG/0.3ML DEVI Inject 0.3 mg into the skin daily as needed. For anaphylaxis      .  fexofenadine-pseudoephedrine (ALLEGRA-D ALLERGY & CONGESTION) 180-240 MG per 24 hr tablet Take 1 tablet by mouth daily as needed. For seasonal allergies      . furosemide (LASIX) 20 MG tablet Take 1 tablet (20 mg total) by mouth daily. Take an extra 20mg  daily as needed  90 tablet  3  . isosorbide mononitrate (IMDUR) 30 MG 24 hr tablet Take 1 tablet (30 mg total) by mouth daily.      . Magnesium 250 MG TABS Take 1 tablet by mouth daily.       . montelukast (SINGULAIR) 10 MG tablet Take 10 mg by mouth daily.       . potassium chloride SA (K-DUR,KLOR-CON) 20 MEQ tablet Take 0.5 tablets (10 mEq total) by mouth daily.  90 tablet  2  . predniSONE (DELTASONE) 20 MG tablet Take 40 mg by mouth as needed.       . ranitidine (ZANTAC) 150 MG capsule Take 150 mg by mouth daily.        No current facility-administered medications for this visit.     Past Medical History  Diagnosis Date  . CHF (congestive heart failure)   . Cardiomyopathy   . PVC (premature ventricular contraction)   . Osteoporosis   . Abnormal chest CT   . Asthma   . Benign hypertension   .  Hyperlipidemia   . Osteoarthrosis, unspecified whether generalized or localized, lower leg   . Hives     Long Hx of hives  . Osteoarthritis   . 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 Psoriasis   . NSVT (nonsustained ventricular tachycardia)   . Chronic renal insufficiency     With creatinine 1.4    Past Surgical History  Procedure Laterality Date  . Knee arthroplasty Left 2006  . Knee arthroplasty Right 12/10    x2  . Implantation of dual-chamber icd.      St. Jude DDD-ICD 1/07  . Cardiac defibrillator removal  2014    History   Social History  . Marital Status: Married    Spouse Name: N/A    Number of Children: N/A  . Years of Education: N/A   Occupational History  . Not on file.   Social History Main Topics  . Smoking status: Former Smoker -- 1.00 packs/day for 10 years    Types: Cigarettes    Quit date:  12/31/1970  . Smokeless tobacco: Never Used  . Alcohol Use: Yes     Comment: 2 DRINKS PER DAY  . Drug Use: No  . Sexual Activity: Not on file   Other Topics Concern  . Not on file   Social History Narrative  . No narrative on file    ROS: no fevers or chills, productive cough, hemoptysis, dysphasia, odynophagia, melena, hematochezia, dysuria, hematuria, rash, seizure activity, orthopnea, PND, pedal edema, claudication. Remaining systems are negative.  Physical Exam: Well-developed well-nourished in no acute distress.  Skin is warm and dry.  HEENT is normal.  Neck is supple.  Chest is clear to auscultation with normal expansion.  Cardiovascular exam is regular rate and rhythm.  Abdominal exam nontender or distended. No masses palpated. Extremities show no edema. neuro grossly intact  ECG Sinus rhythm, first degree AV block, nonspecific ST changes. Prolonged QT interval.

## 2014-04-12 NOTE — Assessment & Plan Note (Signed)
Continue statin. 

## 2014-04-12 NOTE — Assessment & Plan Note (Signed)
Continue present blood pressure medications. Check potassium and renal function. 

## 2014-04-12 NOTE — Assessment & Plan Note (Signed)
Continue present dose of Lasix. Not volume overloaded on examination.

## 2014-04-12 NOTE — Assessment & Plan Note (Addendum)
Continue beta blocker. Previously felt secondary to PVCs. Improved on most recent echocardiogram. He is having dyspnea attributed to asthma. Repeat echocardiogram. Check BNP.

## 2014-04-12 NOTE — Patient Instructions (Signed)
Your physician wants you to follow-up in: 6 MONTHS WITH DR CRENSHAW You will receive a reminder letter in the mail two months in advance. If you don't receive a letter, please call our office to schedule the follow-up appointment.   Your physician recommends that you HAVE LAB WORK TODAY  Your physician has requested that you have an echocardiogram. Echocardiography is a painless test that uses sound waves to create images of your heart. It provides your doctor with information about the size and shape of your heart and how well your heart's chambers and valves are working. This procedure takes approximately one hour. There are no restrictions for this procedure.   

## 2014-04-12 NOTE — Assessment & Plan Note (Signed)
Continued amiodarone. Check TSH and liver functions. Recent chest x-ray performed at [facility]. We will obtain results.

## 2014-04-13 ENCOUNTER — Ambulatory Visit (HOSPITAL_COMMUNITY)
Admission: RE | Admit: 2014-04-13 | Discharge: 2014-04-13 | Disposition: A | Discharge status: Home / Self Care | Payer: Medicare Other | Source: Ambulatory Visit | Attending: Cardiovascular Disease | Admitting: Cardiovascular Disease

## 2014-04-13 DIAGNOSIS — I359 Nonrheumatic aortic valve disorder, unspecified: Secondary | ICD-10-CM

## 2014-04-13 DIAGNOSIS — I509 Heart failure, unspecified: Secondary | ICD-10-CM | POA: Insufficient documentation

## 2014-04-13 DIAGNOSIS — J45909 Unspecified asthma, uncomplicated: Secondary | ICD-10-CM | POA: Diagnosis not present

## 2014-04-13 DIAGNOSIS — I4949 Other premature depolarization: Secondary | ICD-10-CM | POA: Diagnosis not present

## 2014-04-13 DIAGNOSIS — I5022 Chronic systolic (congestive) heart failure: Secondary | ICD-10-CM

## 2014-04-13 DIAGNOSIS — I5020 Unspecified systolic (congestive) heart failure: Secondary | ICD-10-CM | POA: Diagnosis not present

## 2014-04-13 DIAGNOSIS — E785 Hyperlipidemia, unspecified: Secondary | ICD-10-CM

## 2014-04-13 DIAGNOSIS — I428 Other cardiomyopathies: Secondary | ICD-10-CM | POA: Diagnosis present

## 2014-04-13 DIAGNOSIS — Z87891 Personal history of nicotine dependence: Secondary | ICD-10-CM | POA: Diagnosis not present

## 2014-04-13 DIAGNOSIS — I10 Essential (primary) hypertension: Secondary | ICD-10-CM

## 2014-04-13 DIAGNOSIS — I4891 Unspecified atrial fibrillation: Secondary | ICD-10-CM | POA: Insufficient documentation

## 2014-04-13 LAB — BRAIN NATRIURETIC PEPTIDE: Brain Natriuretic Peptide: 112.9 pg/mL — ABNORMAL HIGH (ref 0.0–100.0)

## 2014-04-13 LAB — TSH: TSH: 2.309 u[IU]/mL (ref 0.350–4.500)

## 2014-04-13 NOTE — Progress Notes (Signed)
2D Echocardiogram Complete.  04/13/2014   [name], RDCS  

## 2014-04-19 ENCOUNTER — Other Ambulatory Visit: Payer: Self-pay | Admitting: *Deleted

## 2014-04-19 DIAGNOSIS — I4949 Other premature depolarization: Secondary | ICD-10-CM

## 2014-04-19 MED ORDER — ATORVASTATIN CALCIUM 40 MG PO TABS: 20.0000 mg | ORAL_TABLET | Freq: Every day | ORAL | Status: DC

## 2014-05-15 ENCOUNTER — Ambulatory Visit (INDEPENDENT_AMBULATORY_CARE_PROVIDER_SITE_OTHER): Payer: Medicare Other | Admitting: Cardiology

## 2014-05-15 ENCOUNTER — Encounter: Payer: Self-pay | Admitting: Cardiology

## 2014-05-15 VITALS — BP 142/82 | HR 68 | Ht 69.0 in | Wt 236.6 lb

## 2014-05-15 DIAGNOSIS — E785 Hyperlipidemia, unspecified: Secondary | ICD-10-CM

## 2014-05-15 DIAGNOSIS — Z95810 Presence of automatic (implantable) cardiac defibrillator: Secondary | ICD-10-CM

## 2014-05-15 DIAGNOSIS — I428 Other cardiomyopathies: Secondary | ICD-10-CM

## 2014-05-15 DIAGNOSIS — I5022 Chronic systolic (congestive) heart failure: Secondary | ICD-10-CM

## 2014-05-15 DIAGNOSIS — I4949 Other premature depolarization: Secondary | ICD-10-CM

## 2014-05-15 DIAGNOSIS — I10 Essential (primary) hypertension: Secondary | ICD-10-CM

## 2014-05-15 MED ORDER — HYDRALAZINE HCL 25 MG PO TABS: 25.0000 mg | ORAL_TABLET | Freq: Three times a day (TID) | ORAL | Status: DC

## 2014-05-15 NOTE — Progress Notes (Signed)
HPI: FU nonischemic cardiomyopathy. Cardiac cath at [facility] in 2004 showed normal coronaries and EF 24; endocardial biopsy unrevealing. 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 Previous holter monitor in Dec 2012 showed frequent PVCs and nonsustained ventricular tachycardia. Fu echo in July 2013 showed improved EF 45, trace AI/MR, aortic root 45 mm, mild LAE. CTA August 2013 showed aortic root 3.8 cm (no aneurysm). He also has had a previous ICD (generator removed in February of 2014 as his device had reached ERI and his LV function had improved). Abd ultrasound 1/15 showed no aneurysm. When I last saw him we repeat his echocardiogram. Echocardiogram August 2015 showed ejection fraction 30%, mild left atrial enlargement, mild aortic insufficiency. Since I last saw him, the patient denies any dyspnea on exertion, orthopnea, PND, pedal edema, palpitations, syncope or chest pain.    Current Outpatient Prescriptions  Medication Sig Dispense Refill  . acetaminophen (TYLENOL) 325 MG tablet Take 325 mg by mouth every 6 (six) hours as needed.      Marland Kitchen albuterol (PROVENTIL HFA;VENTOLIN HFA) 108 (90 BASE) MCG/ACT inhaler Inhale 1 puff into the lungs every 6 (six) hours as needed for wheezing or shortness of breath.      . ALPRAZolam (XANAX) 0.25 MG tablet Take 0.25 mg by mouth at bedtime as needed. For sleep      . amiodarone (PACERONE) 200 MG tablet Take 1 tablet (200 mg total) by mouth daily.  90 tablet  3  . amLODipine (NORVASC) 5 MG tablet Take 1 tablet (5 mg total) by mouth daily.  90 tablet  2  . atorvastatin (LIPITOR) 40 MG tablet Take 0.5 tablets (20 mg total) by mouth daily.  90 tablet  0  . budesonide (PULMICORT) 180 MCG/ACT inhaler Inhale 1 puff into the lungs daily.      . carvedilol (COREG) 25 MG tablet Take 2 tablets (50 mg total) by mouth 2 (two) times daily with a meal.  360 tablet  3  . EPIPEN 2-PAK 0.3 MG/0.3ML  DEVI Inject 0.3 mg into the skin daily as needed. For anaphylaxis      . fexofenadine-pseudoephedrine (ALLEGRA-D ALLERGY & CONGESTION) 180-240 MG per 24 hr tablet Take 1 tablet by mouth daily as needed. For seasonal allergies      . furosemide (LASIX) 20 MG tablet Take 1 tablet (20 mg total) by mouth daily. Take an extra  daily as needed  90 tablet  3  . isosorbide mononitrate (IMDUR) 30 MG 24 hr tablet Take 1 tablet (30 mg total) by mouth daily.      . Magnesium 250 MG TABS Take 1 tablet by mouth daily.       . montelukast (SINGULAIR) 10 MG tablet Take 10 mg by mouth daily.       . potassium chloride SA (K-DUR,KLOR-CON) 20 MEQ tablet Take 0.5 tablets (10 mEq total) by mouth daily.  90 tablet  2  . predniSONE (DELTASONE) 20 MG tablet Take 40 mg by mouth as needed.       . ranitidine (ZANTAC) 150 MG capsule Take 150 mg by mouth daily.        No current facility-administered medications for this visit.     Past Medical History  Diagnosis Date  . CHF (congestive heart failure)   . Cardiomyopathy   . PVC (premature ventricular contraction)   . Osteoporosis   . Abnormal chest CT   .  Asthma   . Benign hypertension   . Hyperlipidemia   . Osteoarthrosis, unspecified whether generalized or localized, lower leg   . Hives     Long Hx of hives  . Osteoarthritis   . 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 Psoriasis   . NSVT (nonsustained ventricular tachycardia)   . Chronic renal insufficiency     With creatinine 1.4    Past Surgical History  Procedure Laterality Date  . Knee arthroplasty Left 2006  . Knee arthroplasty Right 12/10    x2  . Implantation of dual-chamber icd.      St. Jude DDD-ICD 1/07  . Cardiac defibrillator removal  2014    History   Social History  . Marital Status: Married    Spouse Name: N/A    Number of Children: N/A  . Years of Education: N/A   Occupational History  . Not on file.   Social History Main Topics  . Smoking status: Former  Smoker -- 1.00 packs/day for 10 years    Types: Cigarettes    Quit date: 12/31/1970  . Smokeless tobacco: Never Used  . Alcohol Use: Yes     Comment: 2 DRINKS PER DAY  . Drug Use: No  . Sexual Activity: Not on file   Other Topics Concern  . Not on file   Social History Narrative  . No narrative on file    ROS: no fevers or chills, productive cough, hemoptysis, dysphasia, odynophagia, melena, hematochezia, dysuria, hematuria, rash, seizure activity, orthopnea, PND, pedal edema, claudication. Remaining systems are negative.  Physical Exam: Well-developed well-nourished in no acute distress.  Skin is warm and dry.  HEENT is normal.  Neck is supple.  Chest is clear to auscultation with normal expansion.  Cardiovascular exam is regular rate and rhythm.  Abdominal exam nontender or distended. No masses palpated. Extremities show no edema. neuro grossly intact

## 2014-05-15 NOTE — Patient Instructions (Signed)
Your physician recommends that you schedule a follow-up appointment in: 6-8 WEEKS WITH DR CRENSHAW  START HYDRALAZINE 25 MG ONE TABLET THREE TIMES DAILY  APPOINTMENT WITH DR Lewayne Bunting TO DISCUSS ICD GENERATOR CHANGE

## 2014-05-15 NOTE — Assessment & Plan Note (Signed)
Continue amiodarone and beta blocker. This was felt likely to be the cause of his cardiomyopathy previously.

## 2014-05-15 NOTE — Assessment & Plan Note (Signed)
Continue statin. 

## 2014-05-15 NOTE — Assessment & Plan Note (Signed)
Euvolemic on examination. Continue present dose of diuretics. 

## 2014-05-15 NOTE — Assessment & Plan Note (Signed)
Previous generator removed as patient's LV function had improved. Ejection fraction of 30%. Will ask Dr. Ladona Ridgel to see if ICD needs to be replaced.

## 2014-05-15 NOTE — Assessment & Plan Note (Signed)
LV function has deteriorated again. Ejection fraction 30%. Continue beta blocker. History of angioedema with ACE inhibitors. Continue nitrates. Add hydralazine 25 mg by mouth 3 times a day. Titrate medications as tolerated by blood pressure.

## 2014-05-15 NOTE — Assessment & Plan Note (Signed)
Blood pressure controlled. Continue present medications. 

## 2014-06-02 ENCOUNTER — Other Ambulatory Visit: Payer: Self-pay | Admitting: *Deleted

## 2014-06-02 MED ORDER — POTASSIUM CHLORIDE CRYS ER 20 MEQ PO TBCR: 10.0000 meq | EXTENDED_RELEASE_TABLET | Freq: Every day | ORAL | Status: DC

## 2014-06-15 ENCOUNTER — Encounter: Payer: Self-pay | Admitting: Internal Medicine

## 2014-06-15 ENCOUNTER — Encounter (INDEPENDENT_AMBULATORY_CARE_PROVIDER_SITE_OTHER): Payer: Medicare Other

## 2014-06-15 ENCOUNTER — Ambulatory Visit (INDEPENDENT_AMBULATORY_CARE_PROVIDER_SITE_OTHER): Payer: Medicare Other | Admitting: Internal Medicine

## 2014-06-15 ENCOUNTER — Encounter: Payer: Self-pay | Admitting: *Deleted

## 2014-06-15 VITALS — BP 118/74 | HR 57 | Ht 68.0 in | Wt 238.0 lb

## 2014-06-15 DIAGNOSIS — I4891 Unspecified atrial fibrillation: Secondary | ICD-10-CM

## 2014-06-15 DIAGNOSIS — I5022 Chronic systolic (congestive) heart failure: Secondary | ICD-10-CM

## 2014-06-15 DIAGNOSIS — I493 Ventricular premature depolarization: Secondary | ICD-10-CM

## 2014-06-15 DIAGNOSIS — I440 Atrioventricular block, first degree: Secondary | ICD-10-CM

## 2014-06-15 NOTE — Patient Instructions (Signed)
Your physician recommends that you schedule a follow-up appointment in: 2-3 months with Dr Ladona Ridgel   Your physician has recommended that you wear a holter monitor. Holter monitors are medical devices that record the heart's electrical activity. Doctors most often use these monitors to diagnose arrhythmias. Arrhythmias are problems with the speed or rhythm of the heartbeat. The monitor is a small, portable device. You can wear one while you do your normal daily activities. This is usually used to diagnose what is causing palpitations/syncope (passing out).

## 2014-06-15 NOTE — Addendum Note (Signed)
Addended by: Dennis Bast F on: 06/15/2014 09:47 AM   Modules accepted: Orders

## 2014-06-15 NOTE — Assessment & Plan Note (Signed)
The density of the patient's PVCs is uncertain. His PVCs may be responsible for worsening left ventricular dysfunction. I've recommended that the patient undergo 48 hour Holter monitoring. If he is having more than 20,000 PVCs in a 24-hour period, then I would be more aggressive at trying to get would've his PVCs, with the idea being that his reduction PVCs improve his LV function. On the other hand, if he is having less than 10,000 PVCs in a 24-hour period, we would assume his LV dysfunction is not related to PVCs. At that point, consideration for another ICD would be an option.

## 2014-06-15 NOTE — Assessment & Plan Note (Signed)
It is unclear what the density of his PVCs he is at this time. He will undergo Holter monitoring. He will continue amiodarone for now.

## 2014-06-15 NOTE — Progress Notes (Signed)
Patient ID: [name], male   DOB: Apr 23, 1946, 68 y.o.   MRN: [id number] [facility] 48 hour holter monitor applied to patient.

## 2014-06-15 NOTE — Progress Notes (Signed)
HPI [name] returns today for followup. He is a very pleasant 68 year old man who I met many years ago with a nonischemic cardiomyopathy , chronic systolic heart failure, and severe left ventricular dysfunction. He underwent ICD implantation. After approximately 8 years, his ICD was removed and a new device was not reimplanted as his ejection fraction had markedly improved, at times being her normal, and because he had never experienced any malignant ventricular arrhythmias. Repeat echocardiogram done several weeks ago, demonstrated return of left ventricular dysfunction, with an ejection fraction of 30%. It is unclear whether his PVCs have increased in frequency and severity, possibly causing his left ventricular dysfunction. He has not had syncope. He otherwise feels well. Allergies  Allergen Reactions  . 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 Aspirin Other (See Comments)    Other Reaction: Other reaction  . Indocin [Indomethacin] Other (See Comments)  . Mobic [Meloxicam] Other (See Comments)  . Nsaids Other (See Comments)    Other Reaction: Other reaction     Current Outpatient Prescriptions  Medication Sig Dispense Refill  . acetaminophen (TYLENOL) 325 MG tablet Take 325 mg by mouth every 6 (six) hours as needed.      Marland Kitchen albuterol (PROVENTIL HFA;VENTOLIN HFA) 108 (90 BASE) MCG/ACT inhaler Inhale 1 puff into the lungs every 6 (six) hours as needed for wheezing or shortness of breath.      . ALPRAZolam (XANAX) 0.25 MG tablet Take 0.25 mg by mouth at bedtime as needed. For sleep      . amiodarone (PACERONE) 200 MG tablet Take 1 tablet (200 mg total) by mouth daily.  90 tablet  3  . amLODipine (NORVASC) 5 MG tablet Take 1 tablet (5 mg total) by mouth daily.  90 tablet  2  . atorvastatin (LIPITOR) 40 MG tablet Take 0.5 tablets (20 mg total) by mouth daily.  90 tablet  0  . budesonide  (PULMICORT) 180 MCG/ACT inhaler Inhale 1 puff into the lungs daily.      . carvedilol (COREG) 25 MG tablet Take 2 tablets (50 mg total) by mouth 2 (two) times daily with a meal.  360 tablet  3  . EPIPEN 2-PAK 0.3 MG/0.3ML DEVI Inject 0.3 mg into the skin daily as needed. For anaphylaxis      . fexofenadine-pseudoephedrine (ALLEGRA-D ALLERGY & CONGESTION) 180-240 MG per 24 hr tablet Take 1 tablet by mouth daily as needed. For seasonal allergies      . furosemide (LASIX) 20 MG tablet Take 1 tablet (20 mg total) by mouth daily. Take an extra 61m daily as needed  90 tablet  3  . hydrALAZINE (APRESOLINE) 25 MG tablet Take 1 tablet (25 mg total) by mouth 3 (three) times daily.  270 tablet  3  . isosorbide mononitrate (IMDUR) 30 MG 24 hr tablet Take 1 tablet (30 mg total) by mouth daily.      . Magnesium 250 MG TABS Take 1 tablet by mouth daily.       . montelukast (SINGULAIR) 10 MG tablet Take 10 mg by mouth daily.       . potassium chloride SA (K-DUR,KLOR-CON) 20 MEQ tablet Take 0.5 tablets (10 mEq total) by mouth daily.  45 tablet  1  . predniSONE (DELTASONE) 20 MG tablet Take 40 mg by mouth as needed.       . ranitidine (ZANTAC) 150 MG capsule Take 150 mg by mouth  daily.        No current facility-administered medications for this visit.     Past Medical History  Diagnosis Date  . CHF (congestive heart failure)   . Cardiomyopathy   . PVC (premature ventricular contraction)   . Osteoporosis   . Abnormal chest CT   . Asthma   . Benign hypertension   . Hyperlipidemia   . Osteoarthrosis, unspecified whether generalized or localized, lower leg   . Hives     Long Hx of hives  . Osteoarthritis   . 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 Psoriasis   . NSVT (nonsustained ventricular tachycardia)   . Chronic renal insufficiency     With creatinine 1.4    ROS:   All systems reviewed and negative except as noted in the HPI.   Past Surgical History  Procedure Laterality Date  .  Knee arthroplasty Left 2006  . Knee arthroplasty Right 12/10    x2  . Implantation of dual-chamber icd.      St[employer] Jude DDD-ICD 1/07  . Cardiac defibrillator removal  2014     Family History  Problem Relation Age of Onset  . Heart attack Father   . Lung cancer Father      History   Social History  . Marital Status: Married    Spouse Name: N/A    Number of Children: N/A  . Years of Education: N/A   Occupational History  . Not on file.   Social History Main Topics  . Smoking status: Former Smoker -- 1.00 packs/day for 10 years    Types: Cigarettes    Quit date: 12/31/1970  . Smokeless tobacco: Never Used  . Alcohol Use: Yes     Comment: 2 DRINKS PER DAY  . Drug Use: No  . Sexual Activity: Not on file   Other Topics Concern  . Not on file   Social History Narrative  . No narrative on file     BP 118/74  Pulse 57  Ht 5' 8" (1.727 m)  Wt 238 lb (107.956 kg)  BMI 36.20 kg/m2  Physical Exam:  Well appearing 68 year old man, NAD HEENT: Unremarkable Neck:  No JVD, no thyromegally Back:  No CVA tenderness Lungs:  Clear with no wheezes, rales, or rhonchi. HEART:  Regular rate rhythm, no murmurs, no rubs, no clicks Abd:  soft, positive bowel sounds, no organomegally, no rebound, no guarding Ext:  2 plus pulses, no edema, no cyanosis, no clubbing Skin:  No rashes no nodules Neuro:  CN II through XII intact, motor grossly intact  EKG - normal sinus rhythm with first degree AV block  Assess/Plan:

## 2014-07-03 NOTE — Progress Notes (Signed)
HPI: FU nonischemic cardiomyopathy. Cardiac cath at [facility] in 2004 showed normal coronaries and EF 24; endocardial biopsy unrevealing. 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 Previous holter monitor in Dec 2012 showed frequent PVCs and nonsustained ventricular tachycardia. Fu echo in July 2013 showed improved EF 45, trace AI/MR, aortic root 45 mm, mild LAE. CTA August 2013 showed aortic root 3.8 cm (no aneurysm). He also has had a previous ICD (generator removed in February of 2014 as his device had reached ERI and his LV function had improved). Abd ultrasound 1/15 showed no aneurysm. Echocardiogram repeated August 2015 showed ejection fraction 30%, mild left atrial enlargement, mild aortic insufficiency. Patient seen by Dr. Ladona Ridgel for consideration of ICD. Holter monitor was ordered to assess density appears be seen. Since I last saw him, He has mild dyspnea on exertion. No orthopnea, PND, chest pain or syncope. He has developed bilateral pedal edema.  Current Outpatient Prescriptions  Medication Sig Dispense Refill  . acetaminophen (TYLENOL) 325 MG tablet Take 325 mg by mouth every 6 (six) hours as needed.    Marland Kitchen albuterol (PROVENTIL HFA;VENTOLIN HFA) 108 (90 BASE) MCG/ACT inhaler Inhale 1 puff into the lungs every 6 (six) hours as needed for wheezing or shortness of breath.    . ALPRAZolam (XANAX) 0.25 MG tablet Take 0.25 mg by mouth at bedtime as needed. For sleep    . amiodarone (PACERONE) 200 MG tablet Take 1 tablet (200 mg total) by mouth daily. 90 tablet 3  . amLODipine (NORVASC) 5 MG tablet Take 1 tablet (5 mg total) by mouth daily. 90 tablet 2  . atorvastatin (LIPITOR) 40 MG tablet Take 0.5 tablets (20 mg total) by mouth daily. 90 tablet 0  . budesonide (PULMICORT) 180 MCG/ACT inhaler Inhale 1 puff into the lungs daily.    . carvedilol (COREG) 25 MG tablet Take 2 tablets (50 mg total) by mouth 2 (two) times  daily with a meal. 360 tablet 3  . EPIPEN 2-PAK 0.3 MG/0.3ML DEVI Inject 0.3 mg into the skin daily as needed. For anaphylaxis    . fexofenadine-pseudoephedrine (ALLEGRA-D ALLERGY & CONGESTION) 180-240 MG per 24 hr tablet Take 1 tablet by mouth daily as needed. For seasonal allergies    . furosemide (LASIX) 20 MG tablet Take 1 tablet (20 mg total) by mouth daily. Take an extra 20mg  daily as needed 90 tablet 3  . hydrALAZINE (APRESOLINE) 25 MG tablet Take 1 tablet (25 mg total) by mouth 3 (three) times daily. 270 tablet 3  . isosorbide mononitrate (IMDUR) 30 MG 24 hr tablet Take 1 tablet (30 mg total) by mouth daily.    . Magnesium 250 MG TABS Take 1 tablet by mouth daily.     . montelukast (SINGULAIR) 10 MG tablet Take 10 mg by mouth daily.     . potassium chloride SA (K-DUR,KLOR-CON) 20 MEQ tablet Take 0.5 tablets (10 mEq total) by mouth daily. 45 tablet 1  . predniSONE (DELTASONE) 20 MG tablet Take 40 mg by mouth as needed.     . ranitidine (ZANTAC) 150 MG capsule Take 150 mg by mouth daily.      No current facility-administered medications for this visit.     Past Medical History  Diagnosis Date  . CHF (congestive heart failure)   . Cardiomyopathy   . PVC (premature ventricular contraction)   . Osteoporosis   . Abnormal chest CT   .  Asthma   . Benign hypertension   . Hyperlipidemia   . Osteoarthrosis, unspecified whether generalized or localized, lower leg   . Hives     Long Hx of hives  . Osteoarthritis   . 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 Psoriasis   . NSVT (nonsustained ventricular tachycardia)   . Chronic renal insufficiency     With creatinine 1.4    Past Surgical History  Procedure Laterality Date  . Knee arthroplasty Left 2006  . Knee arthroplasty Right 12/10    x2  . Implantation of dual-chamber icd.      St. Jude DDD-ICD 1/07  . Cardiac defibrillator removal  2014    History   Social History  . Marital Status: Married    Spouse Name: N/A      Number of Children: N/A  . Years of Education: N/A   Occupational History  . Not on file.   Social History Main Topics  . Smoking status: Former Smoker -- 1.00 packs/day for 10 years    Types: Cigarettes    Quit date: 12/31/1970  . Smokeless tobacco: Never Used  . Alcohol Use: Yes     Comment: 2 DRINKS PER DAY  . Drug Use: No  . Sexual Activity: Not on file   Other Topics Concern  . Not on file   Social History Narrative    ROS: no fevers or chills, productive cough, hemoptysis, dysphasia, odynophagia, melena, hematochezia, dysuria, hematuria, rash, seizure activity, orthopnea, PND, claudication. Remaining systems are negative.  Physical Exam: Well-developed obese in no acute distress.  Skin is warm and dry.  HEENT is normal.  Neck is supple.  Chest is clear to auscultation with normal expansion.  Cardiovascular exam is regular rate and rhythm.  Abdominal exam nontender or distended. No masses palpated. Extremities show 1+ edema. neuro grossly intact

## 2014-07-07 ENCOUNTER — Ambulatory Visit (INDEPENDENT_AMBULATORY_CARE_PROVIDER_SITE_OTHER): Payer: Medicare Other | Admitting: Cardiology

## 2014-07-07 ENCOUNTER — Encounter: Payer: Self-pay | Admitting: Cardiology

## 2014-07-07 DIAGNOSIS — E785 Hyperlipidemia, unspecified: Secondary | ICD-10-CM

## 2014-07-07 DIAGNOSIS — I10 Essential (primary) hypertension: Secondary | ICD-10-CM

## 2014-07-07 DIAGNOSIS — Z95810 Presence of automatic (implantable) cardiac defibrillator: Secondary | ICD-10-CM

## 2014-07-07 DIAGNOSIS — I493 Ventricular premature depolarization: Secondary | ICD-10-CM

## 2014-07-07 DIAGNOSIS — I5022 Chronic systolic (congestive) heart failure: Secondary | ICD-10-CM

## 2014-07-07 DIAGNOSIS — I429 Cardiomyopathy, unspecified: Secondary | ICD-10-CM

## 2014-07-07 MED ORDER — FUROSEMIDE 20 MG PO TABS: 40.0000 mg | ORAL_TABLET | Freq: Every day | ORAL | Status: DC

## 2014-07-07 MED ORDER — HYDRALAZINE HCL 50 MG PO TABS: 50.0000 mg | ORAL_TABLET | Freq: Three times a day (TID) | ORAL | Status: DC

## 2014-07-07 NOTE — Assessment & Plan Note (Signed)
Continue amiodarone. At next office visit recheck chest x-ray, liver functions and TSH.

## 2014-07-07 NOTE — Assessment & Plan Note (Signed)
Mildly volume overloaded today.Increase Lasix to 40 mg daily. Check potassium and renal function in 1 week.

## 2014-07-07 NOTE — Assessment & Plan Note (Signed)
Question etiology. Previously presumed secondary to ectopy. His LV function has decreased again. Continue beta blocker. Discontinue Norvasc and advanced hydralazine. Continue nitrates. He is not on an ACE inhibitor because he has an allergy. He has seen Dr. Ladona Ridgel and a Holter monitor was placed to evaluate frequency of PVCs. If greater than 20,000 ablation will be considered. Final decision concerning replacement of ICD per Dr. Ladona Ridgel.

## 2014-07-07 NOTE — Assessment & Plan Note (Signed)
Continue statin. 

## 2014-07-07 NOTE — Assessment & Plan Note (Signed)
Given cardiomyopathy I would discontinue Norvasc and increase hydralazine to 50 mg by mouth 3 times a day. Follow blood pressure and adjust medications as needed.

## 2014-07-07 NOTE — Patient Instructions (Signed)
Your physician recommends that you schedule a follow-up appointment in: 8-12 WEEKS WITH DR CRENSHAW  STOP AMLODIPINE  INCREASE HYDRALAZINE TO 50 MG THREE TIMES DAILY= 2 OF THE 25 MG TABLETS THREE TIMES DDAILY  INCREASE FUROSEMIDE TO 40 MG ONCE DAILY= 2 OF THE 20 MG TABLETS ONCE DAILY  Your physician recommends that you return for lab work in: ONE WEEK

## 2014-07-09 ENCOUNTER — Encounter: Payer: Self-pay | Admitting: Cardiology

## 2014-07-17 LAB — BASIC METABOLIC PANEL WITH GFR
BUN: 15 mg/dL (ref 6–23)
CO2: 30 mEq/L (ref 19–32)
Calcium: 9.2 mg/dL (ref 8.4–10.5)
Chloride: 99 mEq/L (ref 96–112)
Creat: 1.62 mg/dL — ABNORMAL HIGH (ref 0.50–1.35)
GFR, EST AFRICAN AMERICAN: 50 mL/min — AB
GFR, EST NON AFRICAN AMERICAN: 44 mL/min — AB
Glucose, Bld: 103 mg/dL — ABNORMAL HIGH (ref 70–99)
POTASSIUM: 4.1 meq/L (ref 3.5–5.3)
SODIUM: 139 meq/L (ref 135–145)

## 2014-07-27 ENCOUNTER — Encounter (HOSPITAL_COMMUNITY): Payer: Self-pay | Admitting: Internal Medicine

## 2014-09-14 ENCOUNTER — Ambulatory Visit: Payer: Medicare Other | Admitting: Internal Medicine

## 2014-09-16 NOTE — Progress Notes (Signed)
HPI: FU nonischemic cardiomyopathy. Cardiac cath at [facility] in 2004 showed normal coronaries and EF 24; endocardial biopsy unrevealing. 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 Previous holter monitor in Dec 2012 showed frequent PVCs and nonsustained ventricular tachycardia. Fu echo in July 2013 showed improved EF 45, trace AI/MR, aortic root 45 mm, mild LAE. CTA August 2013 showed aortic root 3.8 cm (no aneurysm). He also has had a previous ICD (generator removed in February of 2014 as his device had reached ERI and his LV function had improved). Abd ultrasound 1/15 showed no aneurysm. Echocardiogram repeated August 2015 showed ejection fraction 30%, mild left atrial enlargement, mild aortic insufficiency. Patient seen by Dr. Ladona Ridgel for consideration of ICD. Holter monitor showed only 61 PVCs. Since I last saw him, he denies significant dyspnea. He complains of a cough that is not related to position or activities. He was seen by pulmonary and felt to have COPD. He has been placed on oxygen at night. He has chest pressure after severe coughing episodes but no exertional chest pain. No pedal edema.  Current Outpatient Prescriptions  Medication Sig Dispense Refill  . acetaminophen (TYLENOL) 325 MG tablet Take 325 mg by mouth every 6 (six) hours as needed.    Marland Kitchen albuterol (PROVENTIL HFA;VENTOLIN HFA) 108 (90 BASE) MCG/ACT inhaler Inhale 1 puff into the lungs every 6 (six) hours as needed for wheezing or shortness of breath.    . ALPRAZolam (XANAX) 0.25 MG tablet Take 0.25 mg by mouth at bedtime as needed. For sleep    . amiodarone (PACERONE) 200 MG tablet Take 1 tablet (200 mg total) by mouth daily. 90 tablet 3  . atorvastatin (LIPITOR) 40 MG tablet Take 0.5 tablets (20 mg total) by mouth daily. 90 tablet 0  . carvedilol (COREG) 25 MG tablet Take 2 tablets (50 mg total) by mouth 2 (two) times daily with a meal. 360 tablet 3    . EPIPEN 2-PAK 0.3 MG/0.3ML DEVI Inject 0.3 mg into the skin daily as needed. For anaphylaxis    . fexofenadine-pseudoephedrine (ALLEGRA-D ALLERGY & CONGESTION) 180-240 MG per 24 hr tablet Take 1 tablet by mouth daily as needed. For seasonal allergies    . furosemide (LASIX) 20 MG tablet Take 2 tablets (40 mg total) by mouth daily. Take an extra 20mg  daily as needed 180 tablet 3  . hydrALAZINE (APRESOLINE) 50 MG tablet Take 1 tablet (50 mg total) by mouth 3 (three) times daily. 270 tablet 4  . isosorbide mononitrate (IMDUR) 30 MG 24 hr tablet Take 1 tablet (30 mg total) by mouth daily.    . Magnesium 250 MG TABS Take 1 tablet by mouth daily.     . montelukast (SINGULAIR) 10 MG tablet Take 10 mg by mouth daily.     . OXYGEN Inhale 2L for 8 hours at night.    . potassium chloride SA (K-DUR,KLOR-CON) 20 MEQ tablet Take 0.5 tablets (10 mEq total) by mouth daily. 45 tablet 1  . predniSONE (DELTASONE) 20 MG tablet Take 40 mg by mouth as needed.     . ranitidine (ZANTAC) 150 MG capsule Take 150 mg by mouth daily.      No current facility-administered medications for this visit.     Past Medical History  Diagnosis Date  . CHF (congestive heart failure)   . Cardiomyopathy   . PVC (premature ventricular contraction)   . Osteoporosis   .  Abnormal chest CT   . Asthma   . Benign hypertension   . Hyperlipidemia   . Osteoarthrosis, unspecified whether generalized or localized, lower leg   . Hives     Long Hx of hives  . Osteoarthritis   . 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 Psoriasis   . NSVT (nonsustained ventricular tachycardia)   . Chronic renal insufficiency     With creatinine 1.4    Past Surgical History  Procedure Laterality Date  . Knee arthroplasty Left 2006  . Knee arthroplasty Right 12/10    x2  . Implantation of dual-chamber icd.      St. Jude DDD-ICD 1/07  . Cardiac defibrillator removal  2014  . Biv icd genertaor change out N/A 10/08/2012    Procedure: BIV  ICD GENERTAOR CHANGE OUT;  Surgeon: Marinus Maw, MD;  Location: [facility] CATH LAB;  Service: Cardiovascular;  Laterality: N/A;    History   Social History  . Marital Status: Married    Spouse Name: N/A    Number of Children: N/A  . Years of Education: N/A   Occupational History  . Not on file.   Social History Main Topics  . Smoking status: Former Smoker -- 1.00 packs/day for 10 years    Types: Cigarettes    Quit date: 12/31/1970  . Smokeless tobacco: Never Used  . Alcohol Use: Yes     Comment: 2 DRINKS PER DAY  . Drug Use: No  . Sexual Activity: Not on file   Other Topics Concern  . Not on file   Social History Narrative    ROS: no fevers or chills, productive cough, hemoptysis, dysphasia, odynophagia, melena, hematochezia, dysuria, hematuria, rash, seizure activity, orthopnea, PND, pedal edema, claudication. Remaining systems are negative.  Physical Exam: Well-developed well-nourished in no acute distress.  Skin is warm and dry.  HEENT is normal.  Neck is supple.  Chest is clear to auscultation with normal expansion.  Cardiovascular exam is regular rate and rhythm.  Abdominal exam nontender or distended. No masses palpated. Extremities show no edema. neuro grossly intact  ECG sinus rhythm at a rate of 61. First-degree AV block. Nonspecific ST changes.

## 2014-09-18 ENCOUNTER — Ambulatory Visit (INDEPENDENT_AMBULATORY_CARE_PROVIDER_SITE_OTHER)
Admission: RE | Admit: 2014-09-18 | Discharge: 2014-09-18 | Disposition: A | Discharge status: Home / Self Care | Payer: Medicare Other | Source: Ambulatory Visit | Attending: Cardiology | Admitting: Cardiology

## 2014-09-18 ENCOUNTER — Ambulatory Visit (INDEPENDENT_AMBULATORY_CARE_PROVIDER_SITE_OTHER): Payer: Medicare Other | Admitting: Cardiology

## 2014-09-18 ENCOUNTER — Encounter: Payer: Self-pay | Admitting: Cardiology

## 2014-09-18 DIAGNOSIS — I429 Cardiomyopathy, unspecified: Secondary | ICD-10-CM

## 2014-09-18 DIAGNOSIS — N183 Chronic kidney disease, stage 3 unspecified: Secondary | ICD-10-CM | POA: Insufficient documentation

## 2014-09-18 DIAGNOSIS — I10 Essential (primary) hypertension: Secondary | ICD-10-CM

## 2014-09-18 DIAGNOSIS — I5022 Chronic systolic (congestive) heart failure: Secondary | ICD-10-CM

## 2014-09-18 DIAGNOSIS — I493 Ventricular premature depolarization: Secondary | ICD-10-CM

## 2014-09-18 DIAGNOSIS — E785 Hyperlipidemia, unspecified: Secondary | ICD-10-CM

## 2014-09-18 DIAGNOSIS — Z95810 Presence of automatic (implantable) cardiac defibrillator: Secondary | ICD-10-CM

## 2014-09-18 MED ORDER — HYDRALAZINE HCL 50 MG PO TABS: 75.0000 mg | ORAL_TABLET | Freq: Three times a day (TID) | ORAL | Status: DC

## 2014-09-18 NOTE — Assessment & Plan Note (Signed)
Blood pressure controlled. I will increase hydralazine to 75 mg 3 times a day for cardiomyopathy.

## 2014-09-18 NOTE — Assessment & Plan Note (Signed)
Continue statin. 

## 2014-09-18 NOTE — Assessment & Plan Note (Signed)
Patient is complaining of a cough but does not appear to be volume overloaded. Mild dyspnea on exertion. This has been attributed to COPD. Check BNP and if elevated increased diuretics.

## 2014-09-18 NOTE — Assessment & Plan Note (Signed)
Continue beta blocker and hydralazine (increased to 75 mg by mouth 3 times a day) and nitrates. Not on an ACE inhibitor given history of angioedema. Recent Holter monitor did not show significant PVC frequency. He is to follow-up with Dr. Ladona Ridgel in the next 2 weeks as he may require ICD; previously removed as LV function had improved but now deteriorated again.

## 2014-09-18 NOTE — Patient Instructions (Addendum)
Your physician recommends that you schedule a follow-up appointment in: 4 MONTHS WITH DR CRENSHAW  INCREASE HYDRALAZINE TO 75 MG THREE TIMES DAILY= 1 AND 1/2 OF 50 MG TABLET THREE TIMES DAILY  Your physician recommends that you HAVE LAB WORK TODAY   A chest x-ray takes a picture of the organs and structures inside the chest, including the heart, lungs, and blood vessels. This test can show several things, including, whether the heart is enlarges; whether fluid is building up in the lungs; and whether pacemaker / defibrillator leads are still in place. [facility]- GO TO THE [facility]

## 2014-09-18 NOTE — Assessment & Plan Note (Signed)
Continue amiodarone. TSH and liver functions normal. Check chest x-ray.

## 2014-09-19 LAB — BRAIN NATRIURETIC PEPTIDE: Brain Natriuretic Peptide: 86.3 pg/mL (ref 0.0–100.0)

## 2014-10-04 ENCOUNTER — Ambulatory Visit (INDEPENDENT_AMBULATORY_CARE_PROVIDER_SITE_OTHER): Payer: Medicare Other | Admitting: Internal Medicine

## 2014-10-04 ENCOUNTER — Encounter: Payer: Self-pay | Admitting: Internal Medicine

## 2014-10-04 VITALS — BP 140/80 | HR 64 | Ht 69.0 in | Wt 230.0 lb

## 2014-10-04 DIAGNOSIS — I5022 Chronic systolic (congestive) heart failure: Secondary | ICD-10-CM

## 2014-10-04 DIAGNOSIS — R930 Abnormal findings on diagnostic imaging of skull and head, not elsewhere classified: Secondary | ICD-10-CM

## 2014-10-04 NOTE — Assessment & Plan Note (Signed)
The patient has gained weight over the years and he is encouraged to lose weight and reduce his fat consumption. He will continue his statin therapy for now, but his liver function test will need close follow-up with the combination of atorvastatin and amiodarone.

## 2014-10-04 NOTE — Assessment & Plan Note (Signed)
His left ventricular dysfunction persists. He is on good medical therapy. He has had no syncope. We discussed the pros and cons as well as the risks and benefits of insertion of another ICD. Because he had done so well without any sustained arrhythmias, I have recommended watchful waiting rather than proceeding with another ICD. If his left ventricular dysfunction persists or worsens, or if he were to have syncope or some other evidence of an increased risk of malignant ventricular arrhythmias, then we would reconsider insertion of an ICD.

## 2014-10-04 NOTE — Patient Instructions (Signed)
Your physician recommends that you continue on your current medications as directed. Please refer to the Current Medication list given to you today.  Your physician wants you to follow-up in: 1 year with Dr. Taylor.  You will receive a reminder letter in the mail two months in advance. If you don't receive a letter, please call our office to schedule the follow-up appointment.  

## 2014-10-04 NOTE — Progress Notes (Signed)
HPI [name] returns today for followup. He is a very pleasant 69 year old man who I met many years ago with a nonischemic cardiomyopathy , chronic systolic heart failure, and severe left ventricular dysfunction. He underwent ICD implantation. After approximately 8 years, his ICD was removed and a new device was not reimplanted as his ejection fraction had markedly improved, at times being near normal, and because he had never experienced any malignant ventricular arrhythmias. Repeat echocardiogram done several weeks ago, demonstrated return of left ventricular dysfunction, with an ejection fraction of 30%. It is unclear whether his PVCs have increased in frequency and severity, possibly causing his left ventricular dysfunction. He has not had syncope. He otherwise feels well except for some cough. A 24-hour monitor demonstrated less than 1000 PVCs in 24 hours. Allergies  Allergen Reactions  . 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 Aspirin Other (See Comments)    Other Reaction: Other reaction  . Indocin [Indomethacin] Other (See Comments)  . Mobic [Meloxicam] Other (See Comments)  . Nsaids Other (See Comments)    Other Reaction: Other reaction     Current Outpatient Prescriptions  Medication Sig Dispense Refill  . acetaminophen (TYLENOL) 325 MG tablet Take 325 mg by mouth every 6 (six) hours as needed.    Marland Kitchen albuterol (PROVENTIL HFA;VENTOLIN HFA) 108 (90 BASE) MCG/ACT inhaler Inhale 1 puff into the lungs every 6 (six) hours as needed for wheezing or shortness of breath.    . ALPRAZolam (XANAX) 0.25 MG tablet Take 0.25 mg by mouth at bedtime as needed. For sleep    . amiodarone (PACERONE) 200 MG tablet Take 1 tablet (200 mg total) by mouth daily. 90 tablet 3  . atorvastatin (LIPITOR) 40 MG tablet Take 0.5 tablets (20 mg total) by mouth daily. 90 tablet 0  . carvedilol (COREG) 25 MG tablet Take 2  tablets (50 mg total) by mouth 2 (two) times daily with a meal. 360 tablet 3  . fexofenadine-pseudoephedrine (ALLEGRA-D ALLERGY & CONGESTION) 180-240 MG per 24 hr tablet Take 1 tablet by mouth daily as needed. For seasonal allergies    . furosemide (LASIX) 20 MG tablet Take 2 tablets (40 mg total) by mouth daily. Take an extra 29m daily as needed 180 tablet 3  . hydrALAZINE (APRESOLINE) 50 MG tablet Take 1.5 tablets (75 mg total) by mouth 3 (three) times daily. 405 tablet 4  . isosorbide mononitrate (IMDUR) 30 MG 24 hr tablet Take 1 tablet (30 mg total) by mouth daily.    . Magnesium 250 MG TABS Take 1 tablet by mouth daily.     . montelukast (SINGULAIR) 10 MG tablet Take 10 mg by mouth daily.     . OXYGEN Inhale 2L for 8 hours at night.    . potassium chloride SA (K-DUR,KLOR-CON) 20 MEQ tablet Take 0.5 tablets (10 mEq total) by mouth daily. 45 tablet 1  . predniSONE (DELTASONE) 20 MG tablet Take 40 mg by mouth as needed.     . ranitidine (ZANTAC) 150 MG capsule Take 150 mg by mouth daily.     .Marland KitchenUmeclidinium-Vilanterol 62.5-25 MCG/INH AEPB Inhale 62.5 mcg into the lungs 1 day or 1 dose.    .Marland KitchenEPIPEN 2-PAK 0.3 MG/0.3ML DEVI Inject 0.3 mg into the skin daily as needed. For anaphylaxis     No current facility-administered medications for this visit.     Past Medical History  Diagnosis Date  .  CHF (congestive heart failure)   . Cardiomyopathy   . PVC (premature ventricular contraction)   . Osteoporosis   . Abnormal chest CT   . Asthma   . Benign hypertension   . Hyperlipidemia   . Osteoarthrosis, unspecified whether generalized or localized, lower leg   . Hives     Long Hx of hives  . Osteoarthritis   . 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 Psoriasis   . NSVT (nonsustained ventricular tachycardia)   . Chronic renal insufficiency     With creatinine 1.4    ROS:   All systems reviewed and negative except as noted in the HPI.   Past Surgical History  Procedure  Laterality Date  . Knee arthroplasty Left 2006  . Knee arthroplasty Right 12/10    x2  . Implantation of dual-chamber icd.      St. Jude DDD-ICD 1/07  . Cardiac defibrillator removal  2014  . Biv icd genertaor change out N/A 10/08/2012    Procedure: BIV ICD GENERTAOR CHANGE OUT;  Surgeon: Evans Lance, MD;  Location: [facility] CATH LAB;  Service: Cardiovascular;  Laterality: N/A;     Family History  Problem Relation Age of Onset  . Heart attack Father   . Lung cancer Father      History   Social History  . Marital Status: Married    Spouse Name: N/A  . Number of Children: N/A  . Years of Education: N/A   Occupational History  . Not on file.   Social History Main Topics  . Smoking status: Former Smoker -- 1.00 packs/day for 10 years    Types: Cigarettes    Quit date: 12/31/1970  . Smokeless tobacco: Never Used  . Alcohol Use: Yes     Comment: 2 DRINKS PER DAY  . Drug Use: No  . Sexual Activity: Not on file   Other Topics Concern  . Not on file   Social History Narrative     BP 140/80 mmHg  Pulse 64  Ht 5' 9"  (1.753 m)  Wt 230 lb (104.327 kg)  BMI 33.95 kg/m2  Physical Exam:  Well appearing 70 year old man, NAD HEENT: Unremarkable Neck:  6 cm JVD, no thyromegally Back:  No CVA tenderness Lungs:  Clear with no wheezes, rales, or rhonchi. HEART:  Regular rate rhythm, no murmurs, no rubs, no clicks Abd:  soft, positive bowel sounds, no organomegally, no rebound, no guarding Ext:  2 plus pulses, no edema, no cyanosis, no clubbing Skin:  No rashes no nodules Neuro:  CN II through XII intact, motor grossly intact   Assess/Plan:

## 2014-10-04 NOTE — Assessment & Plan Note (Signed)
Whether he has any underlying lung pathology and what it might be related to still remain a mystery to me. He has an occasional cough. Because he has been on amiodarone at rather low dose for a long time, I still concerns about the possibility of amiodarone lung toxicity. His CT scan is not consistent with this however. I have reviewed his labs and do not see in our system evidence of an ESR. I will defer checking a sedimentation rate to his primary physician but it would be a strong consideration if not previously done. A normal sedimentation rate would essentially exclude any amiodarone lung toxicity.

## 2014-10-04 NOTE — Assessment & Plan Note (Signed)
Cardiac monitoring has demonstrated minimal PVCs. He will continue his current medications. It is clear to me that PVCs have not resulted in worsening left ventricular dysfunction.

## 2014-10-19 ENCOUNTER — Other Ambulatory Visit: Payer: Self-pay

## 2014-10-19 DIAGNOSIS — E785 Hyperlipidemia, unspecified: Secondary | ICD-10-CM

## 2014-10-19 MED ORDER — ATORVASTATIN CALCIUM 40 MG PO TABS: 20.0000 mg | ORAL_TABLET | Freq: Every day | ORAL | Status: DC

## 2014-12-09 NOTE — Consult Note (Signed)
PATIENT NAME:  [name], [name] MR#:  [id number] DATE OF BIRTH:  03-25-1946  DATE OF CONSULTATION:  03/09/2014  REFERRING PHYSICIAN:   CONSULTING PHYSICIAN:  Lainy Wrobleski R. Noga Fogg, MD  PRIMARY CARE PHYSICIAN: Bethann Punches, M.D.   CARDIOLOGIST: [name] of [facility].   REFERRING PHYSICIAN: Dr. Darnelle Catalan.   CHIEF COMPLAINT: Shortness of breath, chest pressure.   HISTORY OF PRESENT ILLNESS: A 69 year old, Caucasian male patient, with history of hypertension, idiopathic cardiomyopathy of 25%, congestive heart failure, presents to the Emergency Room complaining of acute shortness of breath and recurrent chest pressure. The patient mentions that he has had shortness of breath for a long time secondary to his congestive heart failure. He noticed today when he was walking to his mailbox that he developed some chest pressure that lasted about 5 minutes, which resolved and which recurred and he presented to the Emergency Room to get checked. Here, the patient's EKG shows nothing acute. Troponin is normal, but concerning his symptoms, the hospitalist team has been consulted.   He does not have any orthopnea and does have chronic lower extremity edema. His last cardiac catheterization was about 10 years prior at [facility], which was normal as per patient.   PAST MEDICAL HISTORY:  1. Chronic systolic CHF with ejection fraction of 25%.  2. Nonischemic idiopathic cardiomyopathy.  3. Chronic urticaria.  4. Hyperlipidemia.  5. Osteoarthritis.  6. Hypertension.  7. Tonsillectomy.  8. Right total knee replacement.  9. AICD placement, which was removed earlier in the year.   FAMILY HISTORY: CHF, hypertension.   SOCIAL HISTORY: The patient does not smoke, very rare alcohol use. No illicit drug use. Walks on his own. Lives alone. He used to work in [employer] and has retired.   CODE STATUS: Full Code.   HOME MEDICATIONS:  1. Allegra 180 mg oral once a day.  2. Alprazolam 0.25  mg oral once a day. 3. Amiodarone 200 mg oral once a day.  5. Combivent Respimat 1 puff inhaled once a day.  6. Coreg 25 mg 2 tablets oral 2 times a day.  7. Lasix 20 mg daily.  8. Isosorbide mononitrate 30 mg oral once a day.  9. Potassium chloride 10 mEq daily.  10. Lipitor 20 mg daily.  11. Singulair 10 mg 2 times a day.  12. Ventolin 2 puffs inhaled 4 times a day as needed.   REVIEW OF SYSTEMS:  CONSTITUTIONAL: No fever, fatigue.  EYES: No blurred vision, pain or redness.  ENT: No tinnitus, ear pain, hearing loss. RESPIRATORY: No cough, wheeze, hemoptysis.  CARDIOVASCULAR: Has chest pressure, shortness of breath.  GASTROINTESTINAL: No nausea, vomiting, diarrhea, abdominal pain.  GENITOURINARY: No  dysuria or frequency. ENDOCRINE: No  polyuria, nocturia, thyroid problems.  HEMATOLOGIC AND LYMPHATIC: No anemia, easy bruising or bleeding.  INTEGUMENTARY:  No acne, rash, lesions.  MUSCULOSKELETAL: No back pain, or arthritis.  NEUROLOGIC: No focal numbness, weakness, or seizure.  PSYCHIATRIC: No anxiety or depression.   ALLERGIES: ACE INHIBITORS, ASPIRIN, INDOCIN, NONSTEROIDAL ANTI-INFLAMMATORY MEDICATIONS.  PHYSICAL EXAMINATION:  VITAL SIGNS: Temperature 97.8, pulse 70, blood pressure 140/78, saturating 95% on room air.  GENERAL: Obese, Caucasian male patient, lying in bed, seems comfortable, conversational, cooperative with examination.  PSYCHIATRIC: Alert nourished x 3. Mood and affect appropriate. Judgment intact.  HEENT: Atraumatic, mucosa moist and pink. External ears and nose normal. No pallor. No icterus. Pupils bilaterally equal and reactive to light.  NECK: Supple. No thyromegaly or palpable lymph nodes. Trachea midline.  No carotid bruits or JVD.  CARDIOVASCULAR: S1, S2, systolic murmur. Peripheral pulses 2+. Has 1+ edema.  RESPIRATORY: Normal work of breathing. Clear to auscultation on both sides.  GASTROINTESTINAL: Soft abdomen, nontender. Bowel sounds present. No  organomegaly palpable.  SKIN: Warm and dry. No petechiae, rash, or ulcers.  MUSCULOSKELETAL: No joint swelling, redness, effusion of the large joints. Normal muscle tone.  NEUROLOGICAL: Motor strength 5/5 in upper and lower extremities. Sensation is intact all over.   LYMPHATIC: No cervical lymphadenopathy.   LABORATORY STUDIES: Show BNP of 790, glucose 87, BUN 13, creatinine 1.53, sodium 141, potassium 3.9, GFR 47. AST, ALT, alkaline phosphatase, and bilirubin normal. Troponin less than 0.02. TSH of 2.09. WBC 8.7, hemoglobin 14.1, platelets of 189,000.   EKG shows normal sinus rhythm, nonspecific ST-wave changes with occasional PVCs.  Chest x-ray shows severe cardiomegaly, no pulmonary edema.   ASSESSMENT AND PLAN:  1. Recurrent chest pressure with shortness of breath, with a high concern for possible unstable angina in a patient with hypertension and cardiomyopathy. I have advised the patient to stay overnight in the hospital, get 2 more sets of cardiac enzymes, and decide regarding getting a stress test versus cardiac catheterization, but the patient does not want to stay. In spite of warning him that he could have a massive heart attack if he goes home and is not monitored, he has chosen to go home. Discussed with Dr. Darnelle Catalan regarding checking another set of troponin and having him follow up with his heart doctor. The patient should be on an aspirin, beta blocker, and statin.  2. Hypertension. Continue medication. 3. Chronic systolic congestive heart failure, seems stable. No signs of fluid overload.   Thank you for the consult.   TIME SPENT: On this consult was 40 minutes.   ____________________________ Molinda Bailiff Kamare Caspers, MD srs:jr D: 03/09/2014 14:25:36 ET T: 03/09/2014 15:34:22 ET JOB#: [id number]  cc: Wardell Heath R. Noell Lorensen, MD, <Dictator> Madolyn Frieze. Jens Som, MD  Orie Fisherman MD ELECTRONICALLY SIGNED 04/03/2014 14:06

## 2014-12-09 NOTE — Op Note (Signed)
PATIENT NAME:  [name], [name] MR#:  [id number] DATE OF BIRTH:  [birth year]  DATE OF PROCEDURE:  02/13/2014  PREOPERATIVE DIAGNOSIS: Cataract, right eye.   POSTOPERATIVE DIAGNOSIS:  Cataract, right eye.  PROCEDURE PERFORMED: Extracapsular cataract extraction using phacoemulsification with placement of Alcon [id number], 21.5 diopter posterior chamber lens, serial number [id number].   SURGEON: Priyan Kippen. Dingeldein, M.D.   ANESTHESIA: 4% lidocaine, 0.75% Marcaine, a 50-50 mixture with 10 units/mL of Hylenex added, given it is peribulbar.   ANESTHESIOLOGIST: Dr. Darleene Cleaver.   COMPLICATIONS: None.   ESTIMATED BLOOD LOSS: Less than 1 mL.   DESCRIPTION OF PROCEDURE:  The patient was brought to the operating room and given a peribulbar block.  The patient was then prepped and draped in the usual fashion.  The vertical rectus muscles were imbricated using 5-0 silk sutures.  These sutures were then clamped to the sterile drapes as bridle sutures.  A limbal peritomy was performed extending two clock hours and hemostasis was obtained with cautery.  A partial thickness scleral groove was made at the surgical limbus and dissected anteriorly in a lamellar dissection using an Alcon crescent knife.  The anterior chamber was entered superonasally with a Superblade and through the lamellar dissection with a 2.6 mm keratome.  DisCoVisc was used to replace the aqueous and a continuous tear capsulorrhexis was carried out.  Hydrodissection and hydrodelineation were carried out with balanced salt and a 27 gauge canula.  The nucleus was rotated to confirm the effectiveness of the hydrodissection.  Phacoemulsification was carried out using a divide-and-conquer technique.  Total ultrasound time was 1 minute, 17.2 seconds with an average power of 22.7 percent. CDE of 29.53.   Irrigation/aspiration was used to remove the residual cortex; 0.1 mL cefuroxime was injected via the paracentesis track containing 1 mg of drug.  DisCoVisc was used to inflate the capsule and the internal incision was enlarged to 3 mm with the crescent knife.  The intraocular lens was folded and inserted into the capsular bag using the AcrySert delivery system was used set of a Paramedic.  A suture was placed. Irrigation/aspiration was used to remove the residual DisCoVisc.  Miostat was injected into the anterior chamber through the paracentesis track to inflate the anterior chamber and induce miosis.  The wound was checked for leaks and none were found. The conjunctiva was closed with cautery and the bridle sutures were removed.  Two drops of 0.3% Vigamox were placed on the eye.   An eye shield was placed on the eye.  The patient was discharged to the recovery room in good condition.   ____________________________ Maylon Peppers Dingeldein, MD sad:lt D: 02/13/2014 13:31:09 ET T: 02/13/2014 14:21:58 ET JOB#: [id number]  cc: Viviann Spare A. Dingeldein, MD, <Dictator> Erline Levine MD ELECTRONICALLY SIGNED 02/15/2014 11:28

## 2014-12-12 LAB — PULMONARY FUNCTION TEST

## 2014-12-13 ENCOUNTER — Other Ambulatory Visit: Payer: Self-pay | Admitting: Internal Medicine

## 2015-01-10 ENCOUNTER — Other Ambulatory Visit: Payer: Self-pay

## 2015-01-10 MED ORDER — AMIODARONE HCL 200 MG PO TABS: 200.0000 mg | ORAL_TABLET | Freq: Every day | ORAL | Status: DC

## 2015-01-12 ENCOUNTER — Telehealth: Payer: Self-pay | Admitting: Cardiology

## 2015-01-12 NOTE — Telephone Encounter (Signed)
Spoke with  Sink from [facility] heart failure program - needed patient's last BP, HR, EF.   Info provided

## 2015-01-17 NOTE — Progress Notes (Signed)
HPI: FU nonischemic cardiomyopathy. Cardiac cath at [facility] in 2004 showed normal coronaries and EF 24; endocardial biopsy unrevealing. 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 Previous holter monitor in Dec 2012 showed frequent PVCs and nonsustained ventricular tachycardia. Fu echo in July 2013 showed improved EF 45, trace AI/MR, aortic root 45 mm, mild LAE. CTA August 2013 showed aortic root 3.8 cm (no aneurysm). He also has had a previous ICD (generator removed in February of 2014 as his device had reached ERI and his LV function had improved). Abd ultrasound 1/15 showed no aneurysm. Echocardiogram repeated August 2015 showed ejection fraction 30%, mild left atrial enlargement, mild aortic insufficiency. Patient seen by Dr. Ladona Ridgel for consideration of ICD. Holter monitor showed only 61 PVCs. Dr. Ladona Ridgel elected conservative management and follow-up without replacing ICD. Since I last saw him, he feels much better. He has dyspnea with more extreme activities not routine activities. No orthopnea, PND, pedal edema, chest pain or syncope.  Current Outpatient Prescriptions  Medication Sig Dispense Refill  . acetaminophen (TYLENOL) 325 MG tablet Take 325 mg by mouth every 6 (six) hours as needed.    Marland Kitchen amiodarone (PACERONE) 200 MG tablet Take 1 tablet (200 mg total) by mouth daily. 90 tablet 2  . atorvastatin (LIPITOR) 40 MG tablet Take 0.5 tablets (20 mg total) by mouth daily. 90 tablet 0  . carvedilol (COREG) 25 MG tablet TAKE 2 TABLETS (50 MG TOTAL) BY MOUTH 2 (TWO) TIMES DAILY WITH A MEAL. 360 tablet 0  . EPIPEN 2-PAK 0.3 MG/0.3ML DEVI Inject 0.3 mg into the skin daily as needed. For anaphylaxis    . furosemide (LASIX) 20 MG tablet Take 2 tablets (40 mg total) by mouth daily. Take an extra 20mg  daily as needed 180 tablet 3  . hydrALAZINE (APRESOLINE) 50 MG tablet Take 1.5 tablets (75 mg total) by mouth 3 (three) times  daily. 405 tablet 4  . Magnesium 250 MG TABS Take 1 tablet by mouth daily.     . OXYGEN Inhale 2L for 8 hours at night.    . pantoprazole (PROTONIX) 40 MG tablet Take 40 mg by mouth daily.    . potassium chloride SA (K-DUR,KLOR-CON) 20 MEQ tablet Take 0.5 tablets (10 mEq total) by mouth daily. 45 tablet 1  . predniSONE (DELTASONE) 20 MG tablet Take 40 mg by mouth as needed.     . isosorbide mononitrate (IMDUR) 30 MG 24 hr tablet Take 1 tablet (30 mg total) by mouth daily.     No current facility-administered medications for this visit.     Past Medical History  Diagnosis Date  . CHF (congestive heart failure)   . Cardiomyopathy   . PVC (premature ventricular contraction)   . Osteoporosis   . Abnormal chest CT   . Asthma   . Benign hypertension   . Hyperlipidemia   . Osteoarthrosis, unspecified whether generalized or localized, lower leg   . Hives     Long Hx of hives  . Osteoarthritis   . 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 Psoriasis   . NSVT (nonsustained ventricular tachycardia)   . Chronic renal insufficiency     With creatinine 1.4    Past Surgical History  Procedure Laterality Date  . Knee arthroplasty Left 2006  . Knee arthroplasty Right 12/10    x2  . Implantation of dual-chamber icd.  St. Jude DDD-ICD 1/07  . Cardiac defibrillator removal  2014  . Biv icd genertaor change out N/A 10/08/2012    Procedure: BIV ICD GENERTAOR CHANGE OUT;  Surgeon: Marinus Maw, MD;  Location: [facility] CATH LAB;  Service: Cardiovascular;  Laterality: N/A;    History   Social History  . Marital Status: Married    Spouse Name: N/A  . Number of Children: N/A  . Years of Education: N/A   Occupational History  . Not on file.   Social History Main Topics  . Smoking status: Former Smoker -- 1.00 packs/day for 10 years    Types: Cigarettes    Quit date: 12/31/1970  . Smokeless tobacco: Never Used  . Alcohol Use: 0.0 oz/week    0 Standard drinks or equivalent per  week     Comment: 2 DRINKS PER DAY  . Drug Use: No  . Sexual Activity: Not on file   Other Topics Concern  . Not on file   Social History Narrative    ROS: no fevers or chills, productive cough, hemoptysis, dysphasia, odynophagia, melena, hematochezia, dysuria, hematuria, rash, seizure activity, orthopnea, PND, pedal edema, claudication. Remaining systems are negative.  Physical Exam: Well-developed well-nourished in no acute distress.  Skin is warm and dry.  HEENT is normal.  Neck is supple.  Chest is clear to auscultation with normal expansion.  Cardiovascular exam is regular rate and rhythm.  Abdominal exam nontender or distended. No masses palpated. Extremities show no edema. neuro grossly intact

## 2015-01-19 ENCOUNTER — Ambulatory Visit (INDEPENDENT_AMBULATORY_CARE_PROVIDER_SITE_OTHER): Payer: Medicare Other | Admitting: Cardiology

## 2015-01-19 ENCOUNTER — Encounter: Payer: Self-pay | Admitting: Cardiology

## 2015-01-19 VITALS — BP 118/70 | HR 66 | Ht 69.0 in | Wt 222.3 lb

## 2015-01-19 DIAGNOSIS — I10 Essential (primary) hypertension: Secondary | ICD-10-CM | POA: Diagnosis not present

## 2015-01-19 DIAGNOSIS — I5022 Chronic systolic (congestive) heart failure: Secondary | ICD-10-CM | POA: Diagnosis not present

## 2015-01-19 NOTE — Assessment & Plan Note (Signed)
Continue beta blocker and hydralazine/nitrates. Not on an ACE inhibitor given history of angioedema. Recent Holter monitor did not show significant PVC frequency. He saw Dr. Ladona Ridgel and watchful waiting was recommended without replacing ICD. Plan to repeat his echocardiogram when he returns in 3 months.

## 2015-01-19 NOTE — Assessment & Plan Note (Signed)
Patient is euvolemic on examination. Continue present dose of diuretics. 

## 2015-01-19 NOTE — Patient Instructions (Signed)
Your physician recommends that you schedule a follow-up appointment in: 3 MONTHS WITH DR CRENSHAW  

## 2015-01-19 NOTE — Assessment & Plan Note (Signed)
Continue statin. 

## 2015-01-19 NOTE — Assessment & Plan Note (Signed)
Continue amiodarone. When he returns in 3 months we will repeat his TSH, liver functions and chest x-ray. Note his cough has improved with therapy for gastroesophageal reflux disease.

## 2015-01-19 NOTE — Assessment & Plan Note (Signed)
Blood pressure controlled. Continue present medications. 

## 2015-01-31 ENCOUNTER — Encounter: Payer: Self-pay | Admitting: Cardiology

## 2015-03-11 ENCOUNTER — Other Ambulatory Visit: Payer: Self-pay | Admitting: Cardiology

## 2015-03-12 ENCOUNTER — Other Ambulatory Visit: Payer: Self-pay

## 2015-03-12 MED ORDER — POTASSIUM CHLORIDE CRYS ER 20 MEQ PO TBCR: 10.0000 meq | EXTENDED_RELEASE_TABLET | Freq: Every day | ORAL | Status: DC

## 2015-03-20 ENCOUNTER — Other Ambulatory Visit: Payer: Self-pay | Admitting: Cardiology

## 2015-04-19 ENCOUNTER — Other Ambulatory Visit: Payer: Self-pay | Admitting: Internal Medicine

## 2015-04-19 ENCOUNTER — Ambulatory Visit
Admission: RE | Admit: 2015-04-19 | Discharge: 2015-04-19 | Disposition: A | Discharge status: Home / Self Care | Payer: Medicare Other | Source: Ambulatory Visit | Attending: Internal Medicine | Admitting: Internal Medicine

## 2015-04-19 DIAGNOSIS — I7090 Unspecified atherosclerosis: Secondary | ICD-10-CM | POA: Insufficient documentation

## 2015-04-19 DIAGNOSIS — K5730 Diverticulosis of large intestine without perforation or abscess without bleeding: Secondary | ICD-10-CM | POA: Insufficient documentation

## 2015-04-19 DIAGNOSIS — K8020 Calculus of gallbladder without cholecystitis without obstruction: Secondary | ICD-10-CM | POA: Diagnosis not present

## 2015-04-19 DIAGNOSIS — R1011 Right upper quadrant pain: Secondary | ICD-10-CM | POA: Insufficient documentation

## 2015-04-19 DIAGNOSIS — R109 Unspecified abdominal pain: Secondary | ICD-10-CM

## 2015-04-19 DIAGNOSIS — R1032 Left lower quadrant pain: Secondary | ICD-10-CM | POA: Insufficient documentation

## 2015-04-19 DIAGNOSIS — N261 Atrophy of kidney (terminal): Secondary | ICD-10-CM | POA: Insufficient documentation

## 2015-04-19 NOTE — Progress Notes (Signed)
HPI: FU nonischemic cardiomyopathy. Cardiac cath at [facility] in 2004 showed normal coronaries and EF 24; endocardial biopsy unrevealing. 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 Previous holter monitor in Dec 2012 showed frequent PVCs and nonsustained ventricular tachycardia. Fu echo in July 2013 showed improved EF 45, trace AI/MR, aortic root 45 mm, mild LAE. CTA August 2013 showed aortic root 3.8 cm (no aneurysm). He also has had a previous ICD (generator removed in February of 2014 as his device had reached ERI and his LV function had improved). Abd ultrasound 1/15 showed no aneurysm. Echocardiogram repeated August 2015 showed ejection fraction 30%, mild left atrial enlargement, mild aortic insufficiency. Patient seen by Dr. Ladona Ridgel for consideration of ICD. Holter monitor showed only 61 PVCs. Dr. Ladona Ridgel elected conservative management and follow-up without replacing ICD. Since I last saw him, he denies dyspnea on exertion, orthopnea, PND, worsening pedal edema, chest pain or syncope. He is having problems with abdominal pain which is being evaluated by his primary care physician. CT scan yesterday showed diverticulosis, atherosclerotic vascular disease and a small gallstone with no evidence of cholecystitis.the patient states his pain begins under his ribs bilaterally and causes him to be short of breath because "I can't take a deep breath". He has had some nausea and vomiting as well as diarrhea. No melanoma or hematochezia.  Current Outpatient Prescriptions  Medication Sig Dispense Refill  . acetaminophen (TYLENOL) 325 MG tablet Take 325 mg by mouth every 6 (six) hours as needed.    Marland Kitchen amiodarone (PACERONE) 200 MG tablet Take 1 tablet (200 mg total) by mouth daily. 90 tablet 2  . atorvastatin (LIPITOR) 40 MG tablet Take 0.5 tablets (20 mg total) by mouth daily. 90 tablet 0  . carvedilol (COREG) 25 MG tablet TAKE 2 TABLETS  (50 MG TOTAL) BY MOUTH 2 (TWO) TIMES DAILY WITH A MEAL. 360 tablet 0  . EPIPEN 2-PAK 0.3 MG/0.3ML DEVI Inject 0.3 mg into the skin daily as needed. For anaphylaxis    . furosemide (LASIX) 20 MG tablet Take 2 tablets (40 mg total) by mouth daily. Take an extra 20mg  daily as needed 180 tablet 3  . hydrALAZINE (APRESOLINE) 50 MG tablet Take 1.5 tablets (75 mg total) by mouth 3 (three) times daily. 405 tablet 4  . Magnesium 250 MG TABS Take 1 tablet by mouth daily.     . montelukast (SINGULAIR) 10 MG tablet Take 10 mg by mouth at bedtime.    . OXYGEN Inhale 2L for 8 hours at night.    . pantoprazole (PROTONIX) 40 MG tablet Take 40 mg by mouth 2 (two) times daily.     . potassium chloride SA (K-DUR,KLOR-CON) 20 MEQ tablet Take 0.5 tablets (10 mEq total) by mouth daily. 45 tablet 3  . predniSONE (DELTASONE) 20 MG tablet Take 40 mg by mouth as needed.     . isosorbide mononitrate (IMDUR) 30 MG 24 hr tablet Take 1 tablet (30 mg total) by mouth daily.     No current facility-administered medications for this visit.     Past Medical History  Diagnosis Date  . CHF (congestive heart failure)   . Cardiomyopathy   . PVC (premature ventricular contraction)   . Osteoporosis   . Abnormal chest CT   . Asthma   . Benign hypertension   . Hyperlipidemia   . Osteoarthrosis, unspecified whether generalized or localized, lower leg   .  Hives     Long Hx of hives  . Osteoarthritis   . 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 Psoriasis   . NSVT (nonsustained ventricular tachycardia)   . Chronic renal insufficiency     With creatinine 1.4    Past Surgical History  Procedure Laterality Date  . Knee arthroplasty Left 2006  . Knee arthroplasty Right 12/10    x2  . Implantation of dual-chamber icd.      St. Jude DDD-ICD 1/07  . Cardiac defibrillator removal  2014  . Biv icd genertaor change out N/A 10/08/2012    Procedure: BIV ICD GENERTAOR CHANGE OUT;  Surgeon: Marinus Maw, MD;  Location: [facility]  CATH LAB;  Service: Cardiovascular;  Laterality: N/A;    Social History   Social History  . Marital Status: Married    Spouse Name: N/A  . Number of Children: N/A  . Years of Education: N/A   Occupational History  . Not on file.   Social History Main Topics  . Smoking status: Former Smoker -- 1.00 packs/day for 10 years    Types: Cigarettes    Quit date: 12/31/1970  . Smokeless tobacco: Never Used  . Alcohol Use: 0.0 oz/week    0 Standard drinks or equivalent per week     Comment: 2 DRINKS PER DAY  . Drug Use: No  . Sexual Activity: Not on file   Other Topics Concern  . Not on file   Social History Narrative    ROS: abdominal pain but no fevers or chills, productive cough, hemoptysis, dysphasia, odynophagia, melena, hematochezia, dysuria, hematuria, rash, seizure activity, orthopnea, PND, pedal edema, claudication. Remaining systems are negative.  Physical Exam: Well-developed well-nourished in no acute distress.  Skin is warm and dry.  HEENT is normal.  Neck is supple.  Chest is clear to auscultation with normal expansion.  Cardiovascular exam is regular rate and rhythm. 2/6 systolic murmur apex. Abdominal exam shows mild right upper quadrant, left lower quadrant and right lower quadrant tenderness to palpation. No rebound or guarding. No masses palpated. Extremities show trace edema. neuro grossly intact  ECG sinus rhythm with first-degree AV block. Nonspecific ST changes. Prolonged QT.

## 2015-04-20 ENCOUNTER — Ambulatory Visit
Admission: RE | Admit: 2015-04-20 | Discharge: 2015-04-20 | Disposition: A | Discharge status: Home / Self Care | Payer: Medicare Other | Source: Ambulatory Visit | Attending: Internal Medicine | Admitting: Internal Medicine

## 2015-04-20 ENCOUNTER — Other Ambulatory Visit: Payer: Self-pay | Admitting: Internal Medicine

## 2015-04-20 ENCOUNTER — Encounter: Payer: Self-pay | Admitting: *Deleted

## 2015-04-20 ENCOUNTER — Ambulatory Visit (INDEPENDENT_AMBULATORY_CARE_PROVIDER_SITE_OTHER): Payer: Medicare Other | Admitting: Cardiology

## 2015-04-20 ENCOUNTER — Encounter: Payer: Self-pay | Admitting: Cardiology

## 2015-04-20 VITALS — BP 110/58 | HR 63 | Ht 68.0 in | Wt 223.0 lb

## 2015-04-20 DIAGNOSIS — N289 Disorder of kidney and ureter, unspecified: Secondary | ICD-10-CM | POA: Diagnosis not present

## 2015-04-20 DIAGNOSIS — R1011 Right upper quadrant pain: Secondary | ICD-10-CM

## 2015-04-20 DIAGNOSIS — R109 Unspecified abdominal pain: Secondary | ICD-10-CM | POA: Insufficient documentation

## 2015-04-20 DIAGNOSIS — K8020 Calculus of gallbladder without cholecystitis without obstruction: Secondary | ICD-10-CM | POA: Diagnosis not present

## 2015-04-20 DIAGNOSIS — I5022 Chronic systolic (congestive) heart failure: Secondary | ICD-10-CM | POA: Diagnosis not present

## 2015-04-20 DIAGNOSIS — I493 Ventricular premature depolarization: Secondary | ICD-10-CM

## 2015-04-20 DIAGNOSIS — R1084 Generalized abdominal pain: Secondary | ICD-10-CM

## 2015-04-20 NOTE — Assessment & Plan Note (Signed)
Continue statin. 

## 2015-04-20 NOTE — Patient Instructions (Signed)

## 2015-04-20 NOTE — Assessment & Plan Note (Signed)
Continue amiodarone. Recent liver functions normal. Check chest x-ray and TSH.

## 2015-04-20 NOTE — Assessment & Plan Note (Signed)
Blood pressure controlled. Continue present medications. 

## 2015-04-20 NOTE — Assessment & Plan Note (Signed)
Patient is euvolemic on examination.Continue present dose of Lasix. 

## 2015-04-20 NOTE — Assessment & Plan Note (Signed)
Etiology of abdominal pain unclear. CT scan as outlined above. Liver functions normal. I have asked him to follow up with primary care for this issue. May need gastroenterology evaluation.

## 2015-04-20 NOTE — Assessment & Plan Note (Signed)
Continue beta blocker and hydralazine/nitrates. Not on an ACE inhibitor given history of angioedema. Previous Holter monitor did not show significant PVC frequency. He saw Dr. Ladona Ridgel and watchful waiting was recommended without replacing ICD. Plan to repeat his echocardiogram; if ejection fraction less than 35% we will ask Dr. Ladona Ridgel to review again.

## 2015-04-24 ENCOUNTER — Ambulatory Visit (INDEPENDENT_AMBULATORY_CARE_PROVIDER_SITE_OTHER)
Admission: RE | Admit: 2015-04-24 | Discharge: 2015-04-24 | Disposition: A | Discharge status: Home / Self Care | Payer: Medicare Other | Source: Ambulatory Visit | Attending: Cardiology | Admitting: Cardiology

## 2015-04-24 ENCOUNTER — Other Ambulatory Visit (INDEPENDENT_AMBULATORY_CARE_PROVIDER_SITE_OTHER): Payer: Medicare Other

## 2015-04-24 DIAGNOSIS — I493 Ventricular premature depolarization: Secondary | ICD-10-CM

## 2015-04-24 DIAGNOSIS — E785 Hyperlipidemia, unspecified: Secondary | ICD-10-CM

## 2015-04-24 LAB — TSH: TSH: 1.63 u[IU]/mL (ref 0.35–4.50)

## 2015-04-26 ENCOUNTER — Encounter: Payer: Self-pay | Admitting: Cardiology

## 2015-04-30 ENCOUNTER — Ambulatory Visit (HOSPITAL_COMMUNITY): Payer: Medicare Other | Attending: Cardiovascular Disease

## 2015-04-30 ENCOUNTER — Other Ambulatory Visit: Payer: Self-pay

## 2015-04-30 DIAGNOSIS — I358 Other nonrheumatic aortic valve disorders: Secondary | ICD-10-CM | POA: Insufficient documentation

## 2015-04-30 DIAGNOSIS — I429 Cardiomyopathy, unspecified: Secondary | ICD-10-CM | POA: Diagnosis present

## 2015-04-30 DIAGNOSIS — I5022 Chronic systolic (congestive) heart failure: Secondary | ICD-10-CM | POA: Diagnosis not present

## 2015-04-30 DIAGNOSIS — I071 Rheumatic tricuspid insufficiency: Secondary | ICD-10-CM | POA: Diagnosis not present

## 2015-04-30 DIAGNOSIS — I517 Cardiomegaly: Secondary | ICD-10-CM | POA: Diagnosis not present

## 2015-04-30 DIAGNOSIS — E785 Hyperlipidemia, unspecified: Secondary | ICD-10-CM | POA: Diagnosis not present

## 2015-04-30 DIAGNOSIS — I351 Nonrheumatic aortic (valve) insufficiency: Secondary | ICD-10-CM | POA: Insufficient documentation

## 2015-07-20 ENCOUNTER — Other Ambulatory Visit: Payer: Self-pay | Admitting: Cardiology

## 2015-07-20 NOTE — Telephone Encounter (Signed)
Rx request sent to pharmacy.  

## 2015-08-02 ENCOUNTER — Other Ambulatory Visit: Payer: Self-pay | Admitting: Internal Medicine

## 2015-08-02 ENCOUNTER — Ambulatory Visit
Admission: RE | Admit: 2015-08-02 | Discharge: 2015-08-02 | Disposition: A | Discharge status: Home / Self Care | Payer: Medicare Other | Source: Ambulatory Visit | Attending: Internal Medicine | Admitting: Internal Medicine

## 2015-08-02 DIAGNOSIS — I700 Atherosclerosis of aorta: Secondary | ICD-10-CM | POA: Insufficient documentation

## 2015-08-02 DIAGNOSIS — R0602 Shortness of breath: Secondary | ICD-10-CM | POA: Diagnosis present

## 2015-08-02 DIAGNOSIS — J9811 Atelectasis: Secondary | ICD-10-CM | POA: Diagnosis not present

## 2015-08-02 DIAGNOSIS — K8020 Calculus of gallbladder without cholecystitis without obstruction: Secondary | ICD-10-CM | POA: Insufficient documentation

## 2015-08-02 DIAGNOSIS — R079 Chest pain, unspecified: Secondary | ICD-10-CM | POA: Insufficient documentation

## 2015-08-02 DIAGNOSIS — Z950 Presence of cardiac pacemaker: Secondary | ICD-10-CM | POA: Insufficient documentation

## 2015-08-02 DIAGNOSIS — I2510 Atherosclerotic heart disease of native coronary artery without angina pectoris: Secondary | ICD-10-CM | POA: Insufficient documentation

## 2015-08-02 LAB — POCT I-STAT CREATININE: CREATININE: 1.3 mg/dL — AB (ref 0.61–1.24)

## 2015-08-02 MED ORDER — IOHEXOL 350 MG/ML SOLN
100.0000 mL | Freq: Once | INTRAVENOUS | Status: AC | PRN
  Administered 2015-08-02: 100 mL via INTRAVENOUS

## 2015-08-09 ENCOUNTER — Emergency Department
Admission: EM | Admit: 2015-08-09 | Discharge: 2015-08-09 | Disposition: A | Discharge status: Home / Self Care | Payer: Medicare Other | Attending: Emergency Medicine | Admitting: Emergency Medicine

## 2015-08-09 ENCOUNTER — Encounter: Payer: Self-pay | Admitting: Urgent Care

## 2015-08-09 ENCOUNTER — Emergency Department: Payer: Medicare Other

## 2015-08-09 DIAGNOSIS — W108XXA Fall (on) (from) other stairs and steps, initial encounter: Secondary | ICD-10-CM | POA: Diagnosis not present

## 2015-08-09 DIAGNOSIS — Y9389 Activity, other specified: Secondary | ICD-10-CM | POA: Diagnosis not present

## 2015-08-09 DIAGNOSIS — N189 Chronic kidney disease, unspecified: Secondary | ICD-10-CM | POA: Diagnosis not present

## 2015-08-09 DIAGNOSIS — S40012A Contusion of left shoulder, initial encounter: Secondary | ICD-10-CM | POA: Insufficient documentation

## 2015-08-09 DIAGNOSIS — Z87891 Personal history of nicotine dependence: Secondary | ICD-10-CM | POA: Diagnosis not present

## 2015-08-09 DIAGNOSIS — Y9289 Other specified places as the place of occurrence of the external cause: Secondary | ICD-10-CM | POA: Diagnosis not present

## 2015-08-09 DIAGNOSIS — Z79899 Other long term (current) drug therapy: Secondary | ICD-10-CM | POA: Insufficient documentation

## 2015-08-09 DIAGNOSIS — Y998 Other external cause status: Secondary | ICD-10-CM | POA: Diagnosis not present

## 2015-08-09 DIAGNOSIS — W19XXXA Unspecified fall, initial encounter: Secondary | ICD-10-CM

## 2015-08-09 DIAGNOSIS — S4992XA Unspecified injury of left shoulder and upper arm, initial encounter: Secondary | ICD-10-CM | POA: Diagnosis present

## 2015-08-09 DIAGNOSIS — I129 Hypertensive chronic kidney disease with stage 1 through stage 4 chronic kidney disease, or unspecified chronic kidney disease: Secondary | ICD-10-CM | POA: Insufficient documentation

## 2015-08-09 MED ORDER — MORPHINE SULFATE (PF) 4 MG/ML IV SOLN
4.0000 mg | Freq: Once | INTRAVENOUS | Status: AC
Start: 2015-08-09 — End: 2015-08-09
  Administered 2015-08-09: 4 mg via INTRAMUSCULAR
  Filled 2015-08-09: qty 1

## 2015-08-09 MED ORDER — ONDANSETRON 4 MG PO TBDP
4.0000 mg | ORAL_TABLET | Freq: Once | ORAL | Status: AC
  Administered 2015-08-09: 4 mg via ORAL
  Filled 2015-08-09: qty 1

## 2015-08-09 MED ORDER — OXYCODONE-ACETAMINOPHEN 5-325 MG PO TABS: 1.0000 | ORAL_TABLET | ORAL | Status: DC | PRN

## 2015-08-09 NOTE — ED Provider Notes (Signed)
[facility] Emergency Department Provider Note  ____________________________________________  Time seen: Approximately 3:43 AM  I have reviewed the triage vital signs and the nursing notes.   HISTORY  Chief Complaint Shoulder Injury    HPI [name] is a 69 y.o. male who presents to the ED from home with a chief complaint of left shoulder pain s/p fall. Patient states he missed a step and fell onto his left shoulder approximately 11 PM. States he "tweaked" his lower backbut that does not bother him now. Patient is left-hand dominant. Denies striking head or LOC. Complains of pain to left shoulder with decreased range of motion. Denies associated symptoms of numbness, tingling or arm weakness. Denies recent fever, chills, chest pain, shortness of breath, abdominal pain, nausea, vomiting, diarrhea. Nothing makes his symptoms better. Movement makes his symptoms worse.   Past Medical History  Diagnosis Date  . CHF (congestive heart failure) (HCC)   . Cardiomyopathy   . PVC (premature ventricular contraction)   . Osteoporosis   . Abnormal chest CT   . Asthma   . Benign hypertension   . Hyperlipidemia   . Osteoarthrosis, unspecified whether generalized or localized, lower leg   . Hives     Long Hx of hives  . Osteoarthritis   . 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 Psoriasis   . NSVT (nonsustained ventricular tachycardia) (HCC)   . Chronic renal insufficiency     With creatinine 1.4    Patient Active Problem List   Diagnosis Date Noted  . Abdominal pain 04/20/2015  . Bruit 08/29/2013  . 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 Chronic systolic heart failure (HCC) 09/09/2011  . Essential hypertension 02/15/2009  . OTHER PRIMARY CARDIOMYOPATHIES 02/15/2009  . LUNG NODULE 02/15/2009  . DIZZINESS 02/15/2009  . Nonspecific (abnormal) findings on radiological and other examination of body structure  02/15/2009  . IMPLANTATION OF DEFIBRILLATOR, HX OF 02/15/2009  . Nonspecific abnormal findings on radiological and other examination of skull and head 02/15/2009  . HYPERLIPIDEMIA-MIXED 10/27/2008  . RHEUMATIC FEVER 10/27/2008  . PREMATURE VENTRICULAR CONTRACTIONS 10/27/2008  . OTHER OSTEOPOROSIS 10/27/2008    Past Surgical History  Procedure Laterality Date  . Knee arthroplasty Left 2006  . Knee arthroplasty Right 12/10    x2  . Implantation of dual-chamber icd.      St. Jude DDD-ICD 1/07  . Cardiac defibrillator removal  2014  . Biv icd genertaor change out N/A 10/08/2012    Procedure: BIV ICD GENERTAOR CHANGE OUT;  Surgeon: Marinus Maw, MD;  Location: [facility] CATH LAB;  Service: Cardiovascular;  Laterality: N/A;    Current Outpatient Rx  Name  Route  Sig  Dispense  Refill  . acetaminophen (TYLENOL) 325 MG tablet   Oral   Take 325 mg by mouth every 6 (six) hours as needed.         Marland Kitchen amiodarone (PACERONE) 200 MG tablet   Oral   Take 1 tablet (200 mg total) by mouth daily.   90 tablet   2   . atorvastatin (LIPITOR) 40 MG tablet   Oral   Take 0.5 tablets (20 mg total) by mouth daily.   90 tablet   0     .Marland KitchenMarland KitchenPatient needs to contact office to schedule  Ap ...   . carvedilol (COREG) 25 MG tablet      TAKE 2 TABLETS (50 MG TOTAL) BY MOUTH 2 (TWO) TIMES DAILY WITH A MEAL.  360 tablet   0   . EPIPEN 2-PAK 0.3 MG/0.3ML DEVI   Subcutaneous   Inject 0.3 mg into the skin daily as needed. For anaphylaxis         . furosemide (LASIX) 20 MG tablet   Oral   Take 2 tablets (40 mg total) by mouth daily. Take an extra  daily as needed   180 tablet   3   . hydrALAZINE (APRESOLINE) 50 MG tablet   Oral   Take 1.5 tablets (75 mg total) by mouth 3 (three) times daily.   405 tablet   4   . EXPIRED: isosorbide mononitrate (IMDUR) 30 MG 24 hr tablet   Oral   Take 1 tablet (30 mg total) by mouth daily.         . Magnesium 250 MG TABS   Oral   Take 1 tablet by mouth  daily.          . montelukast (SINGULAIR) 10 MG tablet   Oral   Take 10 mg by mouth at bedtime.         . OXYGEN      Inhale 2L for 8 hours at night.         . pantoprazole (PROTONIX) 40 MG tablet   Oral   Take 40 mg by mouth 2 (two) times daily.          . potassium chloride SA (K-DUR,KLOR-CON) 20 MEQ tablet   Oral   Take 0.5 tablets (10 mEq total) by mouth daily.   45 tablet   3   . predniSONE (DELTASONE) 20 MG tablet   Oral   Take 40 mg by mouth as needed.            Allergies Ace inhibitors; Aspirin; Indocin; Mobic; and Nsaids  Family History  Problem Relation Age of Onset  . Heart attack Father   . Lung cancer Father     Social History Social History  Substance Use Topics  . Smoking status: Former Smoker -- 1.00 packs/day for 10 years    Types: Cigarettes    Quit date: 12/31/1970  . Smokeless tobacco: Never Used  . Alcohol Use: 0.0 oz/week    0 Standard drinks or equivalent per week     Comment: 2 DRINKS PER DAY    Review of Systems Constitutional: No fever/chills Eyes: No visual changes. ENT: No sore throat. Cardiovascular: Denies chest pain. Respiratory: Denies shortness of breath. Gastrointestinal: No abdominal pain.  No nausea, no vomiting.  No diarrhea.  No constipation. Genitourinary: Negative for dysuria. Musculoskeletal: Positive for left shoulder pain. Negative for back pain. Skin: Negative for rash. Neurological: Negative for headaches, focal weakness or numbness.  10-point ROS otherwise negative.  ____________________________________________   PHYSICAL EXAM:  VITAL SIGNS: ED Triage Vitals  Enc Vitals Group     BP 08/09/15 0035 161/88 mmHg     Pulse Rate 08/09/15 0035 82     Resp 08/09/15 0035 20     Temp 08/09/15 0035 98.5 F (36.9 C)     Temp Source 08/09/15 0035 Oral     SpO2 08/09/15 0035 100 %     Weight 08/09/15 0035 218 lb (98.884 kg)     Height 08/09/15 0035  (1.727 m)     Head Cir --      Peak Flow  --      Pain Score 08/09/15 0036 2     Pain Loc --      Pain Edu? --  Excl. in GC? --     Constitutional: Alert and oriented. Well appearing and in mild acute distress. Eyes: Conjunctivae are normal. PERRL. EOMI. Head: Atraumatic. Nose: No congestion/rhinnorhea. Mouth/Throat: Mucous membranes are moist.  Oropharynx non-erythematous. Neck: No stridor.  No cervical spine tenderness to palpation.  No step-offs or deformities. Cardiovascular: Normal rate, regular rhythm. Grossly normal heart sounds.  Good peripheral circulation. Respiratory: Normal respiratory effort.  No retractions. Lungs CTAB. Gastrointestinal: Soft and nontender. No distention. No abdominal bruits. No CVA tenderness. Musculoskeletal: Left shoulder held in adduction and internal rotation. No AC deformity noted. Tender to palpation anterior and superior shoulder joint. Limited range of motion secondary to pain. 2+ radial pulses. Brisk, less than 5 second capillary refill. Symmetrically warm limb without evidence for ischemia. No lower extremity tenderness nor edema.  No joint effusions. Neurologic:  Normal speech and language. No gross focal neurologic deficits are appreciated. No gait instability. Skin:  Skin is warm, dry and intact. No rash noted. Psychiatric: Mood and affect are normal. Speech and behavior are normal.  ____________________________________________   LABS (all labs ordered are listed, but only abnormal results are displayed)  Labs Reviewed - No data to display ____________________________________________  EKG  None ____________________________________________  RADIOLOGY  Left shoulder x-ray (viewed by me, interpreted per Dr. Grace Isaac): Soft tissue swelling without fracture or dislocation. ____________________________________________   PROCEDURES  Procedure(s) performed: None  Critical Care performed: No  ____________________________________________   INITIAL IMPRESSION / ASSESSMENT  AND PLAN / ED COURSE  Pertinent labs & imaging results that were available during my care of the patient were reviewed by me and considered in my medical decision making (see chart for details).  69 year old male with left shoulder contusion. Discussed with patient he likely has ligamentous injury to rotator cuff. Will place in shoulder sling, provide analgesia and patient will follow-up with orthopedics next week. Strict return precautions given. Patient and spouse verbalize understanding and agree with plan of care. ____________________________________________   FINAL CLINICAL IMPRESSION(S) / ED DIAGNOSES  Final diagnoses:  Fall, initial encounter  Shoulder contusion, left, initial encounter      Irean Hong, MD 08/09/15 [phone]

## 2015-08-09 NOTE — ED Notes (Signed)
No answer when called from lobby 

## 2015-08-09 NOTE — ED Notes (Signed)

## 2015-08-09 NOTE — Discharge Instructions (Signed)
1. Take pain medicine as needed (Percocet #20). 2. Wear sling as needed for comfort. You may remove to bathe and sleep. 3. Apply ice to affected area several times daily. 4. Return to the ER for worsening symptoms, persistent vomiting, difficulty breathing or other concerns.  Contusion A contusion is a deep bruise. Contusions are the result of a blunt injury to tissues and muscle fibers under the skin. The injury causes bleeding under the skin. The skin overlying the contusion may turn blue, purple, or yellow. Minor injuries will give you a painless contusion, but more severe contusions may stay painful and swollen for a few weeks.  CAUSES  This condition is usually caused by a blow, trauma, or direct force to an area of the body. SYMPTOMS  Symptoms of this condition include:  Swelling of the injured area.  Pain and tenderness in the injured area.  Discoloration. The area may have redness and then turn blue, purple, or yellow. DIAGNOSIS  This condition is diagnosed based on a physical exam and medical history. An X-ray, CT scan, or MRI may be needed to determine if there are any associated injuries, such as broken bones (fractures). TREATMENT  Specific treatment for this condition depends on what area of the body was injured. In general, the best treatment for a contusion is resting, icing, applying pressure to (compression), and elevating the injured area. This is often called the RICE strategy. Over-the-counter anti-inflammatory medicines may also be recommended for pain control.  HOME CARE INSTRUCTIONS   Rest the injured area.  If directed, apply ice to the injured area:  Put ice in a plastic bag.  Place a towel between your skin and the bag.  Leave the ice on for 20 minutes, 2-3 times per day.  If directed, apply light compression to the injured area using an elastic bandage. Make sure the bandage is not wrapped too tightly. Remove and reapply the bandage as directed by your  health care provider.  If possible, raise (elevate) the injured area above the level of your heart while you are sitting or lying down.  Take over-the-counter and prescription medicines only as told by your health care provider. SEEK MEDICAL CARE IF:  Your symptoms do not improve after several days of treatment.  Your symptoms get worse.  You have difficulty moving the injured area. SEEK IMMEDIATE MEDICAL CARE IF:   You have severe pain.  You have numbness in a hand or foot.  Your hand or foot turns pale or cold.   This information is not intended to replace advice given to you by your health care provider. Make sure you discuss any questions you have with your health care provider.   Document Released: 05/14/2005 Document Revised: 04/25/2015 Document Reviewed: 12/20/2014 Elsevier Interactive Patient Education [employer]

## 2015-08-09 NOTE — ED Notes (Signed)
Patient presents with c/o LEFT shoulder pain s/p fall. Patient advising that he missed a step and fell onto his left side. Patient also with c/p lower back pain.

## 2015-10-17 ENCOUNTER — Other Ambulatory Visit: Payer: Self-pay | Admitting: Cardiology

## 2015-10-17 NOTE — Telephone Encounter (Signed)
Rx(s) sent to pharmacy electronically. OV 10/30/15

## 2015-10-23 NOTE — Progress Notes (Signed)
HPI: FU nonischemic cardiomyopathy. Cardiac cath at [facility] in 2004 showed normal coronaries and EF 24; endocardial biopsy unrevealing. 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 Previous holter monitor in Dec 2012 showed frequent PVCs and nonsustained ventricular tachycardia. He also has had a previous ICD (generator removed in February of 2014 as his device had reached ERI and his LV function had improved). Abd ultrasound 1/15 showed no aneurysm. Echocardiogram repeated September 2016 and showed ejection fraction 45-50%, mild aortic insufficiency, mild biatrial enlargement, moderate tricuspid regurgitation and mildly elevated pulmonary pressures. Since last seen, the patient denies any dyspnea on exertion, orthopnea, PND, pedal edema, palpitations, syncope or chest pain.   Current Outpatient Prescriptions  Medication Sig Dispense Refill  . acetaminophen (TYLENOL) 325 MG tablet Take 325 mg by mouth every 6 (six) hours as needed.    Marland Kitchen amiodarone (PACERONE) 200 MG tablet Take 1 tablet (200 mg total) by mouth daily. 90 tablet 2  . atorvastatin (LIPITOR) 40 MG tablet Take 0.5 tablets (20 mg total) by mouth daily. 90 tablet 0  . carvedilol (COREG) 25 MG tablet TAKE 2 TABLETS (50 MG TOTAL) BY MOUTH 2 (TWO) TIMES DAILY WITH A MEAL. 360 tablet 0  . EPIPEN 2-PAK 0.3 MG/0.3ML DEVI Inject 0.3 mg into the skin daily as needed. For anaphylaxis    . furosemide (LASIX) 20 MG tablet Take 2 tablets (40 mg total) by mouth daily. Take an extra  daily as needed 180 tablet 3  . hydrALAZINE (APRESOLINE) 50 MG tablet TAKE 1 & 1/2 TABLET BY MOUTH 3 TIMES A DAY 405 tablet 0  . Magnesium 250 MG TABS Take 1 tablet by mouth daily.     . montelukast (SINGULAIR) 10 MG tablet Take 10 mg by mouth at bedtime.    Marland Kitchen oxyCODONE-acetaminophen (ROXICET) 5-325 MG tablet Take 1 tablet by mouth every 4 (four) hours as needed for severe pain. 20 tablet 0    . OXYGEN Inhale 2L for 8 hours at night.    . pantoprazole (PROTONIX) 40 MG tablet Take 40 mg by mouth 2 (two) times daily.     . potassium chloride SA (K-DUR,KLOR-CON) 20 MEQ tablet Take 0.5 tablets (10 mEq total) by mouth daily. 45 tablet 3  . predniSONE (DELTASONE) 20 MG tablet Take 40 mg by mouth as needed.     . isosorbide mononitrate (IMDUR) 30 MG 24 hr tablet Take 1 tablet (30 mg total) by mouth daily.     No current facility-administered medications for this visit.     Past Medical History  Diagnosis Date  . CHF (congestive heart failure) (HCC)   . Cardiomyopathy   . PVC (premature ventricular contraction)   . Osteoporosis   . Abnormal chest CT   . Asthma   . Benign hypertension   . Hyperlipidemia   . Osteoarthrosis, unspecified whether generalized or localized, lower leg   . Hives     Long Hx of hives  . Osteoarthritis   . 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 Psoriasis   . NSVT (nonsustained ventricular tachycardia) (HCC)   . Chronic renal insufficiency     With creatinine 1.4    Past Surgical History  Procedure Laterality Date  . Knee arthroplasty Left 2006  . Knee arthroplasty Right 12/10    x2  . Implantation of dual-chamber icd.      St. Jude DDD-ICD 1/07  .  Cardiac defibrillator removal  2014  . Biv icd genertaor change out N/A 10/08/2012    Procedure: BIV ICD GENERTAOR CHANGE OUT;  Surgeon: Marinus Maw, MD;  Location: [facility] CATH LAB;  Service: Cardiovascular;  Laterality: N/A;    Social History   Social History  . Marital Status: Married    Spouse Name: N/A  . Number of Children: N/A  . Years of Education: N/A   Occupational History  . Not on file.   Social History Main Topics  . Smoking status: Former Smoker -- 1.00 packs/day for 10 years    Types: Cigarettes    Quit date: 12/31/1970  . Smokeless tobacco: Never Used  . Alcohol Use: 0.0 oz/week    0 Standard drinks or equivalent per week     Comment: 2 DRINKS PER DAY  . Drug  Use: No  . Sexual Activity: Not on file   Other Topics Concern  . Not on file   Social History Narrative    Family History  Problem Relation Age of Onset  . Heart attack Father   . Lung cancer Father     ROS: Patient had a fall in DecemberAnd has residual shoulder soreness but no fevers or chills, productive cough, hemoptysis, dysphasia, odynophagia, melena, hematochezia, dysuria, hematuria, rash, seizure activity, orthopnea, PND, pedal edema, claudication. Remaining systems are negative.  Physical Exam: Well-developed well-nourished in no acute distress.  Skin is warm and dry.  HEENT is normal.  Neck is supple.  Chest is clear to auscultation with normal expansion.  Cardiovascular exam is regular rate and rhythm.  Abdominal exam nontender or distended. No masses palpated. Extremities show no edema. neuro grossly intact

## 2015-10-30 ENCOUNTER — Encounter: Payer: Self-pay | Admitting: Cardiology

## 2015-10-30 ENCOUNTER — Ambulatory Visit (INDEPENDENT_AMBULATORY_CARE_PROVIDER_SITE_OTHER): Payer: Medicare Other | Admitting: Cardiology

## 2015-10-30 DIAGNOSIS — E785 Hyperlipidemia, unspecified: Secondary | ICD-10-CM | POA: Diagnosis not present

## 2015-10-30 MED ORDER — CARVEDILOL 25 MG PO TABS: 50.0000 mg | ORAL_TABLET | Freq: Two times a day (BID) | ORAL | Status: DC

## 2015-10-30 MED ORDER — AMIODARONE HCL 200 MG PO TABS: 200.0000 mg | ORAL_TABLET | Freq: Every day | ORAL | Status: DC

## 2015-10-30 MED ORDER — ATORVASTATIN CALCIUM 40 MG PO TABS: 20.0000 mg | ORAL_TABLET | Freq: Every day | ORAL | Status: DC

## 2015-10-30 NOTE — Assessment & Plan Note (Signed)
Euvolemic on examination. Continue present dose of Lasix. Laboratories including potassium and renal function recently checked by Dr. Hyacinth Meeker.

## 2015-10-30 NOTE — Assessment & Plan Note (Signed)
Blood pressure controlled. Continue present medications. 

## 2015-10-30 NOTE — Patient Instructions (Signed)
Your physician wants you to follow-up in: 6 MONTHS WITH DR CRENSHAW You will receive a reminder letter in the mail two months in advance. If you don't receive a letter, please call our office to schedule the follow-up appointment.  

## 2015-10-30 NOTE — Assessment & Plan Note (Signed)
Continue amiodarone. TSH and liver functions recently checked by Dr. Hyacinth Meeker. Chest CT noted in December 2016.

## 2015-10-30 NOTE — Assessment & Plan Note (Signed)
Patient with allergy to ACE inhibitor/ARB. Continue hydralazine/nitrates. Continue beta blocker. LV function improved on most recent echocardiogram.

## 2015-10-30 NOTE — Assessment & Plan Note (Signed)
Continue statin. 

## 2016-03-09 ENCOUNTER — Encounter: Payer: Self-pay | Admitting: Anesthesiology

## 2016-03-09 NOTE — H&P (Signed)
See scanned note.

## 2016-03-10 ENCOUNTER — Ambulatory Visit
Admission: RE | Admit: 2016-03-10 | Discharge: 2016-03-10 | Disposition: A | Discharge status: Home / Self Care | Payer: Medicare Other | Source: Ambulatory Visit | Attending: Ophthalmology | Admitting: Ophthalmology

## 2016-03-10 DIAGNOSIS — I129 Hypertensive chronic kidney disease with stage 1 through stage 4 chronic kidney disease, or unspecified chronic kidney disease: Secondary | ICD-10-CM | POA: Diagnosis not present

## 2016-03-10 DIAGNOSIS — K219 Gastro-esophageal reflux disease without esophagitis: Secondary | ICD-10-CM | POA: Diagnosis not present

## 2016-03-10 DIAGNOSIS — E119 Type 2 diabetes mellitus without complications: Secondary | ICD-10-CM | POA: Diagnosis not present

## 2016-03-10 DIAGNOSIS — Z87891 Personal history of nicotine dependence: Secondary | ICD-10-CM | POA: Diagnosis not present

## 2016-03-10 DIAGNOSIS — H2512 Age-related nuclear cataract, left eye: Secondary | ICD-10-CM | POA: Diagnosis present

## 2016-03-10 DIAGNOSIS — I509 Heart failure, unspecified: Secondary | ICD-10-CM | POA: Diagnosis not present

## 2016-03-10 DIAGNOSIS — J45909 Unspecified asthma, uncomplicated: Secondary | ICD-10-CM | POA: Diagnosis not present

## 2016-03-10 DIAGNOSIS — N189 Chronic kidney disease, unspecified: Secondary | ICD-10-CM | POA: Diagnosis not present

## 2016-03-10 DIAGNOSIS — Z95810 Presence of automatic (implantable) cardiac defibrillator: Secondary | ICD-10-CM | POA: Diagnosis not present

## 2016-03-10 DIAGNOSIS — Z5309 Procedure and treatment not carried out because of other contraindication: Secondary | ICD-10-CM | POA: Insufficient documentation

## 2016-03-10 HISTORY — DX: Gastro-esophageal reflux disease without esophagitis: K21.9

## 2016-03-10 MED ORDER — CYCLOPENTOLATE HCL 2 % OP SOLN
OPHTHALMIC | Status: AC
  Filled 2016-03-10: qty 2

## 2016-03-10 MED ORDER — MOXIFLOXACIN HCL 0.5 % OP SOLN: 1.0000 [drp] | OPHTHALMIC | Status: AC | PRN

## 2016-03-10 MED ORDER — SODIUM CHLORIDE 0.9 % IV SOLN: INTRAVENOUS | Status: DC

## 2016-03-10 MED ORDER — PHENYLEPHRINE HCL 10 % OP SOLN: 1.0000 [drp] | OPHTHALMIC | Status: AC | PRN

## 2016-03-10 MED ORDER — CYCLOPENTOLATE HCL 2 % OP SOLN: 1.0000 [drp] | OPHTHALMIC | Status: AC | PRN

## 2016-03-10 MED ORDER — MOXIFLOXACIN HCL 0.5 % OP SOLN
OPHTHALMIC | Status: AC
  Filled 2016-03-10: qty 3

## 2016-03-10 MED ORDER — PHENYLEPHRINE HCL 10 % OP SOLN
OPHTHALMIC | Status: AC
  Filled 2016-03-10: qty 5

## 2016-03-12 ENCOUNTER — Ambulatory Visit
Admission: RE | Admit: 2016-03-12 | Discharge: 2016-03-12 | Disposition: A | Discharge status: Home / Self Care | Payer: Medicare Other | Source: Ambulatory Visit | Attending: Ophthalmology | Admitting: Ophthalmology

## 2016-03-12 ENCOUNTER — Encounter
Admission: RE | Disposition: A | Discharge status: Home / Self Care | Payer: Self-pay | Source: Ambulatory Visit | Attending: Ophthalmology

## 2016-03-12 ENCOUNTER — Ambulatory Visit: Payer: Medicare Other | Admitting: Anesthesiology

## 2016-03-12 ENCOUNTER — Encounter: Payer: Self-pay | Admitting: *Deleted

## 2016-03-12 DIAGNOSIS — N189 Chronic kidney disease, unspecified: Secondary | ICD-10-CM | POA: Insufficient documentation

## 2016-03-12 DIAGNOSIS — J45909 Unspecified asthma, uncomplicated: Secondary | ICD-10-CM | POA: Insufficient documentation

## 2016-03-12 DIAGNOSIS — Z95810 Presence of automatic (implantable) cardiac defibrillator: Secondary | ICD-10-CM | POA: Insufficient documentation

## 2016-03-12 DIAGNOSIS — K219 Gastro-esophageal reflux disease without esophagitis: Secondary | ICD-10-CM | POA: Insufficient documentation

## 2016-03-12 DIAGNOSIS — E119 Type 2 diabetes mellitus without complications: Secondary | ICD-10-CM | POA: Insufficient documentation

## 2016-03-12 DIAGNOSIS — H2512 Age-related nuclear cataract, left eye: Secondary | ICD-10-CM | POA: Insufficient documentation

## 2016-03-12 DIAGNOSIS — I129 Hypertensive chronic kidney disease with stage 1 through stage 4 chronic kidney disease, or unspecified chronic kidney disease: Secondary | ICD-10-CM | POA: Insufficient documentation

## 2016-03-12 DIAGNOSIS — Z87891 Personal history of nicotine dependence: Secondary | ICD-10-CM | POA: Insufficient documentation

## 2016-03-12 DIAGNOSIS — I509 Heart failure, unspecified: Secondary | ICD-10-CM | POA: Insufficient documentation

## 2016-03-12 HISTORY — PX: CATARACT EXTRACTION W/PHACO: SHX586

## 2016-03-12 SURGERY — PHACOEMULSIFICATION, CATARACT, WITH IOL INSERTION
Anesthesia: Choice | Laterality: Left

## 2016-03-12 SURGERY — PHACOEMULSIFICATION, CATARACT, WITH IOL INSERTION
Anesthesia: General | Site: Eye | Laterality: Left | Wound class: Clean

## 2016-03-12 MED ORDER — MOXIFLOXACIN HCL 0.5 % OP SOLN
1.0000 [drp] | OPHTHALMIC | Status: DC | PRN
  Administered 2016-03-12 (×3): 1 [drp] via OPHTHALMIC

## 2016-03-12 MED ORDER — ONDANSETRON HCL 4 MG/2ML IJ SOLN
INTRAMUSCULAR | Status: DC | PRN
  Administered 2016-03-12: 4 mg via INTRAVENOUS

## 2016-03-12 MED ORDER — LIDOCAINE HCL (PF) 4 % IJ SOLN
INTRAMUSCULAR | Status: AC
  Filled 2016-03-12: qty 5

## 2016-03-12 MED ORDER — EPINEPHRINE HCL 1 MG/ML IJ SOLN
INTRAMUSCULAR | Status: AC
  Filled 2016-03-12: qty 2

## 2016-03-12 MED ORDER — TETRACAINE HCL 0.5 % OP SOLN
OPHTHALMIC | Status: AC
  Filled 2016-03-12: qty 2

## 2016-03-12 MED ORDER — ALFENTANIL 500 MCG/ML IJ INJ
INJECTION | INTRAMUSCULAR | Status: DC | PRN
  Administered 2016-03-12: 500 ug via INTRAVENOUS

## 2016-03-12 MED ORDER — CEFUROXIME OPHTHALMIC INJECTION 1 MG/0.1 ML
INJECTION | OPHTHALMIC | Status: AC
  Filled 2016-03-12: qty 0.1

## 2016-03-12 MED ORDER — LIDOCAINE HCL (PF) 4 % IJ SOLN
INTRAOCULAR | Status: DC | PRN
  Administered 2016-03-12: .5 mL via OPHTHALMIC

## 2016-03-12 MED ORDER — LIDOCAINE HCL (PF) 4 % IJ SOLN
INTRAMUSCULAR | Status: DC | PRN
  Administered 2016-03-12: 4 mL via OPHTHALMIC

## 2016-03-12 MED ORDER — MIDAZOLAM HCL 2 MG/2ML IJ SOLN
INTRAMUSCULAR | Status: DC | PRN
  Administered 2016-03-12: 1 mg via INTRAVENOUS

## 2016-03-12 MED ORDER — POVIDONE-IODINE 5 % OP SOLN
OPHTHALMIC | Status: AC
  Filled 2016-03-12: qty 30

## 2016-03-12 MED ORDER — NA CHONDROIT SULF-NA HYALURON 40-17 MG/ML IO SOLN
INTRAOCULAR | Status: AC
  Filled 2016-03-12: qty 1

## 2016-03-12 MED ORDER — CYCLOPENTOLATE HCL 2 % OP SOLN
1.0000 [drp] | OPHTHALMIC | Status: DC | PRN
  Administered 2016-03-12 (×4): 1 [drp] via OPHTHALMIC

## 2016-03-12 MED ORDER — MOXIFLOXACIN HCL 0.5 % OP SOLN
OPHTHALMIC | Status: DC | PRN
Start: 2016-03-12 — End: 2016-03-12
  Administered 2016-03-12: 1 [drp] via OPHTHALMIC

## 2016-03-12 MED ORDER — TETRACAINE HCL 0.5 % OP SOLN
OPHTHALMIC | Status: DC | PRN
  Administered 2016-03-12: 1 [drp] via OPHTHALMIC

## 2016-03-12 MED ORDER — PHENYLEPHRINE HCL 10 % OP SOLN
1.0000 [drp] | OPHTHALMIC | Status: DC | PRN
  Administered 2016-03-12 (×4): 1 [drp] via OPHTHALMIC

## 2016-03-12 MED ORDER — EPINEPHRINE HCL 1 MG/ML IJ SOLN
INTRAMUSCULAR | Status: DC | PRN
  Administered 2016-03-12: 1 mL via OPHTHALMIC

## 2016-03-12 MED ORDER — NA CHONDROIT SULF-NA HYALURON 40-17 MG/ML IO SOLN
INTRAOCULAR | Status: DC | PRN
  Administered 2016-03-12: 1 mL via INTRAOCULAR

## 2016-03-12 MED ORDER — CEFUROXIME OPHTHALMIC INJECTION 1 MG/0.1 ML
INJECTION | OPHTHALMIC | Status: DC | PRN
  Administered 2016-03-12: .1 mL via INTRACAMERAL

## 2016-03-12 MED ORDER — CARBACHOL 0.01 % IO SOLN
INTRAOCULAR | Status: DC | PRN
  Administered 2016-03-12: .5 mL via INTRAOCULAR

## 2016-03-12 MED ORDER — SODIUM CHLORIDE 0.9 % IV SOLN
INTRAVENOUS | Status: DC
  Administered 2016-03-12 (×2): via INTRAVENOUS

## 2016-03-12 MED ORDER — HYALURONIDASE HUMAN 150 UNIT/ML IJ SOLN
INTRAMUSCULAR | Status: AC
  Filled 2016-03-12: qty 1

## 2016-03-12 MED ORDER — BUPIVACAINE HCL (PF) 0.75 % IJ SOLN
INTRAMUSCULAR | Status: AC
  Filled 2016-03-12: qty 10

## 2016-03-12 MED ORDER — POVIDONE-IODINE 5 % OP SOLN
OPHTHALMIC | Status: DC | PRN
  Administered 2016-03-12: 1 via OPHTHALMIC

## 2016-03-12 SURGICAL SUPPLY — 29 items
CANNULA ANT/CHMB 27GA (MISCELLANEOUS) ×2 IMPLANT
CORD BIP STRL DISP 12FT (MISCELLANEOUS) ×2 IMPLANT
CUP MEDICINE 2OZ PLAST GRAD ST (MISCELLANEOUS) ×2 IMPLANT
DRAPE XRAY CASSETTE 23X24 (DRAPES) ×2 IMPLANT
ERASER HMR WETFIELD 18G (MISCELLANEOUS) ×2 IMPLANT
GLOVE BIO SURGEON STRL SZ8 (GLOVE) ×2 IMPLANT
GLOVE SURG LX 6.5 MICRO (GLOVE) ×1
GLOVE SURG LX 8.0 MICRO (GLOVE) ×1
GLOVE SURG LX STRL 6.5 MICRO (GLOVE) ×1 IMPLANT
GLOVE SURG LX STRL 8.0 MICRO (GLOVE) ×1 IMPLANT
GOWN STRL REUS W/ TWL LRG LVL3 (GOWN DISPOSABLE) ×1 IMPLANT
GOWN STRL REUS W/ TWL XL LVL3 (GOWN DISPOSABLE) ×1 IMPLANT
GOWN STRL REUS W/TWL LRG LVL3 (GOWN DISPOSABLE) ×1
GOWN STRL REUS W/TWL XL LVL3 (GOWN DISPOSABLE) ×1
LENS IOL ACRYSOF IQ 21.5 (Intraocular Lens) ×2 IMPLANT
PACK CATARACT (MISCELLANEOUS) ×2 IMPLANT
PACK CATARACT DINGLEDEIN LX (MISCELLANEOUS) ×2 IMPLANT
PACK EYE AFTER SURG (MISCELLANEOUS) ×2 IMPLANT
SHLD EYE VISITEC  UNIV (MISCELLANEOUS) ×2 IMPLANT
SOL BSS BAG (MISCELLANEOUS) ×2
SOL PREP PVP 2OZ (MISCELLANEOUS) ×2
SOLUTION BSS BAG (MISCELLANEOUS) ×1 IMPLANT
SOLUTION PREP PVP 2OZ (MISCELLANEOUS) ×1 IMPLANT
SUT SILK 5-0 (SUTURE) ×2 IMPLANT
SYR 3ML LL SCALE MARK (SYRINGE) ×2 IMPLANT
SYR 5ML LL (SYRINGE) ×2 IMPLANT
SYR TB 1ML 27GX1/2 LL (SYRINGE) ×2 IMPLANT
WATER STERILE IRR 1000ML POUR (IV SOLUTION) ×2 IMPLANT
WIPE NON LINTING 3.25X3.25 (MISCELLANEOUS) ×2 IMPLANT

## 2016-03-12 SURGICAL SUPPLY — 28 items
CANNULA ANT/CHMB 27GA (MISCELLANEOUS) ×2 IMPLANT
CORD BIP STRL DISP 12FT (MISCELLANEOUS) ×2 IMPLANT
CUP MEDICINE 2OZ PLAST GRAD ST (MISCELLANEOUS) ×2 IMPLANT
DRAPE XRAY CASSETTE 23X24 (DRAPES) ×2 IMPLANT
ERASER HMR WETFIELD 18G (MISCELLANEOUS) ×2 IMPLANT
GLOVE BIO SURGEON STRL SZ8 (GLOVE) ×2 IMPLANT
GLOVE SURG LX 6.5 MICRO (GLOVE) ×1
GLOVE SURG LX 8.0 MICRO (GLOVE) ×1
GLOVE SURG LX STRL 6.5 MICRO (GLOVE) ×1 IMPLANT
GLOVE SURG LX STRL 8.0 MICRO (GLOVE) ×1 IMPLANT
GOWN STRL REUS W/ TWL LRG LVL3 (GOWN DISPOSABLE) ×1 IMPLANT
GOWN STRL REUS W/ TWL XL LVL3 (GOWN DISPOSABLE) ×1 IMPLANT
GOWN STRL REUS W/TWL LRG LVL3 (GOWN DISPOSABLE) ×1
GOWN STRL REUS W/TWL XL LVL3 (GOWN DISPOSABLE) ×1
PACK CATARACT (MISCELLANEOUS) IMPLANT
PACK CATARACT DINGLEDEIN LX (MISCELLANEOUS) IMPLANT
PACK EYE AFTER SURG (MISCELLANEOUS) IMPLANT
SHLD EYE VISITEC  UNIV (MISCELLANEOUS) ×2 IMPLANT
SOL BSS BAG (MISCELLANEOUS) ×2
SOL PREP PVP 2OZ (MISCELLANEOUS) ×2
SOLUTION BSS BAG (MISCELLANEOUS) ×1 IMPLANT
SOLUTION PREP PVP 2OZ (MISCELLANEOUS) ×1 IMPLANT
SUT SILK 5-0 (SUTURE) IMPLANT
SYR 3ML LL SCALE MARK (SYRINGE) ×2 IMPLANT
SYR 5ML LL (SYRINGE) ×2 IMPLANT
SYR TB 1ML 27GX1/2 LL (SYRINGE) ×2 IMPLANT
WATER STERILE IRR 1000ML POUR (IV SOLUTION) ×2 IMPLANT
WIPE NON LINTING 3.25X3.25 (MISCELLANEOUS) ×2 IMPLANT

## 2016-03-12 NOTE — Anesthesia Preprocedure Evaluation (Signed)
Anesthesia Evaluation  Patient identified by MRN, date of birth, ID band Patient awake    Reviewed: Allergy & Precautions, H&P , NPO status , Patient's Chart, lab work & pertinent test results, reviewed documented beta blocker date and time   Airway Mallampati: II  TM Distance: >3 FB Neck ROM: full    Dental no notable dental hx. (+) Teeth Intact   Pulmonary neg pulmonary ROS, asthma , former smoker,    Pulmonary exam normal breath sounds clear to auscultation       Cardiovascular Exercise Tolerance: Poor hypertension, +CHF  negative cardio ROS  Ventricular Tachycardia + Cardiac Defibrillator  Rhythm:regular Rate:Normal     Neuro/Psych negative neurological ROS  negative psych ROS   GI/Hepatic negative GI ROS, Neg liver ROS, GERD  Medicated,  Endo/Other  negative endocrine ROSdiabetes  Renal/GU CRFRenal disease     Musculoskeletal   Abdominal   Peds  Hematology negative hematology ROS (+)   Anesthesia Other Findings   Reproductive/Obstetrics negative OB ROS                             Anesthesia Physical Anesthesia Plan  ASA: III  Anesthesia Plan: MAC   Post-op Pain Management:    Induction:   Airway Management Planned:   Additional Equipment:   Intra-op Plan:   Post-operative Plan:   Informed Consent: I have reviewed the patients History and Physical, chart, labs and discussed the procedure including the risks, benefits and alternatives for the proposed anesthesia with the patient or authorized representative who has indicated his/her understanding and acceptance.     Plan Discussed with: CRNA  Anesthesia Plan Comments:         Anesthesia Quick Evaluation

## 2016-03-12 NOTE — H&P (View-Only) (Signed)
See scanned note.

## 2016-03-12 NOTE — Transfer of Care (Signed)
Immediate Anesthesia Transfer of Care Note  Patient: [name]  Procedure(s) Performed: Procedure(s) with comments: CATARACT EXTRACTION PHACO AND INTRAOCULAR LENS PLACEMENT (IOC) (Left) - [place] 01:32 AP% 25.7 CDE 41.33 fluid pack lot # [id number]  Patient Location: PHASE II  Anesthesia Type:MAC  Level of Consciousness: Awake, Alert, Oriented  Airway & Oxygen Therapy: Patient Spontanous Breathing and Patient on room air   Post-op Assessment: Report given to RN and Post -op Vital signs reviewed and stable  Post vital signs: Reviewed and stable  Last Vitals:  Vitals:   03/12/16 0708 03/12/16 0904  BP: 115/69 137/70  Pulse: (!) 52   Resp: 18 14  Temp: 36.5 C 36.3 C    Complications: No apparent anesthesia complications

## 2016-03-12 NOTE — Interval H&P Note (Signed)
History and Physical Interval Note:  03/12/2016 7:20 AM  Gust Rung  has presented today for surgery, with the diagnosis of Cataract  The various methods of treatment have been discussed with the patient and family. After consideration of risks, benefits and other options for treatment, the patient has consented to  Procedure(s): CATARACT EXTRACTION PHACO AND INTRAOCULAR LENS PLACEMENT (IOC) (Left) as a surgical intervention .  The patient's history has been reviewed, patient examined, no change in status, stable for surgery.  I have reviewed the patient's chart and labs.  Questions were answered to the patient's satisfaction.     Signe Tackitt,Craig

## 2016-03-12 NOTE — Anesthesia Postprocedure Evaluation (Addendum)
Anesthesia Post Note  Patient: [name]  Procedure(s) Performed: Procedure(s) (LRB): CATARACT EXTRACTION PHACO AND INTRAOCULAR LENS PLACEMENT (IOC) (Left)  Patient location during evaluation: Other Anesthesia Type: General Level of consciousness: awake and alert Pain management: pain level controlled Vital Signs Assessment: post-procedure vital signs reviewed and stable Respiratory status: spontaneous breathing Cardiovascular status: blood pressure returned to baseline and stable Postop Assessment: no headache, no backache and no signs of nausea or vomiting Anesthetic complications: no    Last Vitals:  Vitals:   03/12/16 0904 03/12/16 0905  BP: 137/70 137/70  Pulse:  62  Resp: 14 16  Temp: 36.3 C 36.3 C    Last Pain:  Vitals:   03/12/16 0905  TempSrc: Tympanic  PainSc: 0-No pain                 Starling Manns

## 2016-03-12 NOTE — Discharge Instructions (Addendum)
See handout. ° °Cataract Surgery, Care After °Refer to this sheet in the next few weeks. These instructions provide you with information on caring for yourself after your procedure. Your caregiver may also give you more specific instructions. Your treatment has been planned according to current medical practices, but problems sometimes occur. Call your caregiver if you have any problems or questions after your procedure.  °HOME CARE INSTRUCTIONS  °· Avoid strenuous activities as directed by your caregiver. °· Ask your caregiver when you can resume driving. °· Use eyedrops or other medicines to help healing and control pressure inside your eye as directed by your caregiver. °· Only take over-the-counter or prescription medicines for pain, discomfort, or fever as directed by your caregiver. °· Do not to touch or rub your eyes. °· You may be instructed to use a protective shield during the first few days and nights after surgery. If not, wear sunglasses to protect your eyes. This is to protect the eye from pressure or from being accidentally bumped. °· Keep the area around your eye clean and dry. Avoid swimming or allowing water to hit you directly in the face while showering. Keep soap and shampoo out of your eyes. °· Do not bend or lift heavy objects. Bending increases pressure in the eye. You can walk, climb stairs, and do light household chores. °· Do not put a contact lens into the eye that had surgery until your caregiver says it is okay to do so. °· Ask your doctor when you can return to work. This will depend on the kind of work that you do. If you work in a dusty environment, you may be advised to wear protective eyewear for a period of time. °· Ask your caregiver when it will be safe to engage in sexual activity. °· Continue with your regular eye exams as directed by your caregiver. °What to expect: °· It is normal to feel itching and mild discomfort for a few days after cataract surgery. Some fluid discharge  is also common, and your eye may be sensitive to light and touch. °· After 1 to 2 days, even moderate discomfort should disappear. In most cases, healing will take about 6 weeks. °· If you received an intraocular lens (IOL), you may notice that colors are very bright or have a blue tinge. Also, if you have been in bright sunlight, everything may appear reddish for a few hours. If you see these color tinges, it is because your lens is clear and no longer cloudy. Within a few months after receiving an IOL, these extra colors should go away. When you have healed, you will probably need new glasses. °SEEK MEDICAL CARE IF:  °· You have increased bruising around your eye. °· You have discomfort not helped by medicine. °SEEK IMMEDIATE MEDICAL CARE IF:  °· You have a  fever. °· You have a worsening or sudden vision loss. °· You have redness, swelling, or increasing pain in the eye. °· You have a thick discharge from the eye that had surgery. °MAKE SURE YOU: °· Understand these instructions. °· Will watch your condition. °· Will get help right away if you are not doing well or get worse. °  °This information is not intended to replace advice given to you by your health care provider. Make sure you discuss any questions you have with your health care provider. °  °Document Released: 02/21/2005 Document Revised: 08/25/2014 Document Reviewed: 03/28/2011 °Elsevier Interactive Patient Education ©2016 [employer] ° °

## 2016-03-12 NOTE — Op Note (Signed)
Date of Surgery: 03/12/2016 Date of Dictation: 03/12/2016 9:01 AM Pre-operative Diagnosis:  Nuclear Sclerotic Cataract left Eye Post-operative Diagnosis: same Procedure performed: Extra-capsular Cataract Extraction (ECCE) with placement of a posterior chamber intraocular lens (IOL) left Eye IOL:  Implant Name Type Inv. Item Serial No. Manufacturer Lot No. LRB No. Used  LENS IOL ACRYSOF IQ 21.5 - [id number] Intraocular Lens LENS IOL ACRYSOF IQ 21.5 [id number] ALCON   Left 1   Anesthesia: 2% Lidocaine and 4% Marcaine in a 50/50 mixture with 10 unites/ml of Hylenex given as a peribulbar Anesthesiologist: Anesthesiologist: Yevette Edwards, MD CRNA: Michaele Offer, CRNA; Stormy Fabian, CRNA Complications: none Estimated Blood Loss: less than 1 ml  Description of procedure:  The patient was given anesthesia and sedation via intravenous access. The patient was then prepped and draped in the usual fashion. A 25-gauge needle was bent for initiating the capsulorhexis. A 5-0 silk suture was placed through the conjunctiva superior and inferiorly to serve as bridle sutures. Hemostasis was obtained at the superior limbus using an eraser cautery. A partial thickness groove was made at the anterior surgical limbus with a 64 Beaver blade and this was dissected anteriorly with an SYSCO. The anterior chamber was entered at 10 o'clock with a 1.0 mm paracentesis knife and through the lamellar dissection with a 2.6 mm Alcon keratome. Epi-Shugarcaine 0.5 CC [9 cc BSS Plus (Alcon), 3 cc 4% preservative-free lidocaine (Hospira) and 4 cc 1:1000 preservative-free, bisulfite-free epinephrine] was injected into the anterior chamber via the paracentesis tract. Epi-Shugarcaine 0.5 CC [9 cc BSS Plus (Alcon), 3 cc 4% preservative-free lidocaine (Hospira) and 4 cc 1:1000 preservative-free, bisulfite-free epinephrine] was injected into the anterior chamber via the paracentesis tract. DiscoVisc was injected to replace the  aqueous and a continuous tear curvilinear capsulorhexis was performed using a bent 25-gauge needle.  Balance salt on a syringe was used to perform hydro-dissection and phacoemulsification was carried out using a divide and conquer technique. Procedure(s) with comments: CATARACT EXTRACTION PHACO AND INTRAOCULAR LENS PLACEMENT (IOC) (Left) - [place] 01:32 AP% 25.7 CDE 41.33 fluid pack lot # [id number]. Irrigation/aspiration was used to remove the residual cortex and the capsular bag was inflated with DiscoVisc. The intraocular lens was inserted into the capsular bag using a pre-loaded UltraSert Delivery System. Irrigation/aspiration was used to remove the residual DiscoVisc. The wound was inflated with balanced salt and checked for leaks. None were found. Miostat was injected via the paracentesis track and 0.1 ml of cefuroxime containing 1 mg of drug  was injected via the paracentesis track. The wound was checked for leaks again and none were found.   The bridal sutures were removed and two drops of Vigamox were placed on the eye. An eye shield was placed to protect the eye and the patient was discharged to the recovery area in good condition.   Tocarra Gassen,Albert MD

## 2016-03-13 ENCOUNTER — Encounter: Payer: Self-pay | Admitting: Ophthalmology

## 2016-03-24 ENCOUNTER — Other Ambulatory Visit: Payer: Self-pay | Admitting: Cardiology

## 2016-04-22 NOTE — Progress Notes (Signed)
HPI: FU nonischemic cardiomyopathy. Cardiac cath at [facility] in 2004 showed normal coronaries and EF 24; endocardial biopsy unrevealing. 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 Previous holter monitor in Dec 2012 showed frequent PVCs and nonsustained ventricular tachycardia. He also has had a previous ICD (generator removed in February of 2014 as his device had reached ERI and his LV function had improved). Abd ultrasound 1/15 showed no aneurysm. Echocardiogram repeated September 2016 and showed ejection fraction 45-50%, mild aortic insufficiency, mild biatrial enlargement, moderate tricuspid regurgitation and mildly elevated pulmonary pressures. Since last seen, the patient denies any dyspnea on exertion, orthopnea, PND, pedal edema, palpitations, syncope or chest pain.   Current Outpatient Prescriptions  Medication Sig Dispense Refill  . amiodarone (PACERONE) 200 MG tablet Take 1 tablet (200 mg total) by mouth daily. 90 tablet 3  . atorvastatin (LIPITOR) 40 MG tablet Take 0.5 tablets (20 mg total) by mouth daily. 90 tablet 3  . carvedilol (COREG) 25 MG tablet Take 2 tablets (50 mg total) by mouth 2 (two) times daily with a meal. 360 tablet 3  . EPIPEN 2-PAK 0.3 MG/0.3ML DEVI Inject 0.3 mg into the skin daily as needed. For anaphylaxis    . furosemide (LASIX) 20 MG tablet Take 2 tablets (40 mg total) by mouth daily. Take an extra 20mg  daily as needed (Patient taking differently: Take 20 mg by mouth daily. Take an extra 20mg  daily as needed) 180 tablet 3  . gabapentin (NEURONTIN) 100 MG capsule Take 100 mg by mouth at bedtime. Take 1-2 tablets at bedtime.    . hydrALAZINE (APRESOLINE) 50 MG tablet TAKE 1 & 1/2 TABLET BY MOUTH 3 TIMES A DAY 405 tablet 0  . isosorbide mononitrate (IMDUR) 30 MG 24 hr tablet Take 1 tablet (30 mg total) by mouth daily.    Marland Kitchen. KLOR-CON M20 20 MEQ tablet TAKE 0.5 TABLETS (10 MEQ TOTAL) BY MOUTH  DAILY. 45 tablet 1  . montelukast (SINGULAIR) 10 MG tablet Take 10 mg by mouth every morning.     . OXYGEN Inhale 2L for 8 hours at night.    . pantoprazole (PROTONIX) 40 MG tablet Take 40 mg by mouth daily.     . potassium chloride (K-DUR) 10 MEQ tablet Take 10 mEq by mouth daily.     No current facility-administered medications for this visit.      Past Medical History:  Diagnosis Date  . Abnormal chest CT   . Asthma   . Benign hypertension   . Cardiomyopathy   . CHF (congestive heart failure) (HCC)   . Chronic renal insufficiency    With creatinine 1.4  . GERD (gastroesophageal reflux disease)   . Hives    Long Hx of hives  . Hyperlipidemia   . NSVT (nonsustained ventricular tachycardia) (HCC)   . Osteoarthritis   . Osteoarthrosis, unspecified whether generalized or localized, lower leg   . Osteoporosis   . Psoriasis   . PVC (premature ventricular contraction)   . 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 BIV ICD GENERTAOR CHANGE OUT N/A 10/08/2012   Procedure: BIV ICD GENERTAOR CHANGE OUT;  Surgeon: Marinus MawGregg W Taylor, MD;  Location[facility] C[facility];  Service: Cardiovascular;  Laterality: N/A;  . CARDIAC CATHETERIZATION    . CARDIAC DEFIBRILLATOR REMOVAL  2014  . CATARACT EXTRACTION    .  CATARACT EXTRACTION W/PHACO Left 03/12/2016   Procedure: CATARACT EXTRACTION PHACO AND INTRAOCULAR LENS PLACEMENT (IOC);  Surgeon: Sallee Lange, MD;  Location: [facility] ORS;  Service: Ophthalmology;  Laterality: Left;  [place] 01:32 AP% 25.7 CDE 41.33 fluid pack lot # [id number]  . Implantation of dual-chamber ICD.     St. Jude DDD-ICD 1/07  . KNEE ARTHROPLASTY Left 2006  . KNEE ARTHROPLASTY Right 12/10   x2    Social History   Social History  . Marital status: Married    Spouse name: N/A  . Number of children: N/A  . Years of education: N/A   Occupational History  . Not on file.   Social History Main Topics  . Smoking  status: Former Smoker    Packs/day: 1.00    Years: 10.00    Types: Cigarettes    Quit date: 12/31/1970  . Smokeless tobacco: Never Used  . Alcohol use 0.0 oz/week     Comment: 2 DRINKS PER DAY, none this week (7/26)  . Drug use: No  . Sexual activity: Not on file   Other Topics Concern  . Not on file   Social History Narrative  . No narrative on file    Family History  Problem Relation Age of Onset  . Heart attack Father   . Lung cancer Father     ROS: no fevers or chills, productive cough, hemoptysis, dysphasia, odynophagia, melena, hematochezia, dysuria, hematuria, rash, seizure activity, orthopnea, PND, pedal edema, claudication. Remaining systems are negative.  Physical Exam: Well-developed well-nourished in no acute distress.  Skin is warm and dry.  HEENT is normal.  Neck is supple.  Chest is clear to auscultation with normal expansion.  Cardiovascular exam is regular rate and rhythm.  Abdominal exam nontender or distended. No masses palpated. Extremities show no edema. neuro grossly intact  ECG-Sinus rhythm at a rate of 49. First-degree AV block. Cannot rule out prior anterior infarct. Nonspecific ST changes.  A/P  1 Chronic systolic congestive heart failure-patient is euvolemic on examination. Continue present dose of Lasix.  2 hypertension-blood pressure controlled. Continue present medications.  3 hyperlipidemia-continue statin.  4 cardiomyopathy-continue hydralazine/nitrates and beta blocker. Patient has an allergy to ace inhibitors and ARB's. LV function improved on most recent echocardiogram.  5 PVCs-continue amiodarone. Chest x-ray, TSH and liver functions checked recently by Dr. Hyacinth Meeker.  6 chronic stage III kidney disease-renal function monitored by Dr. Hyacinth Meeker.  Olga Millers, MD

## 2016-04-24 ENCOUNTER — Ambulatory Visit (INDEPENDENT_AMBULATORY_CARE_PROVIDER_SITE_OTHER): Payer: Medicare Other | Admitting: Cardiology

## 2016-04-24 ENCOUNTER — Encounter: Payer: Self-pay | Admitting: Cardiology

## 2016-04-24 VITALS — BP 142/70 | HR 49 | Ht 69.0 in | Wt 216.0 lb

## 2016-04-24 DIAGNOSIS — I493 Ventricular premature depolarization: Secondary | ICD-10-CM | POA: Diagnosis not present

## 2016-04-24 DIAGNOSIS — I10 Essential (primary) hypertension: Secondary | ICD-10-CM

## 2016-04-24 DIAGNOSIS — I5022 Chronic systolic (congestive) heart failure: Secondary | ICD-10-CM | POA: Diagnosis not present

## 2016-04-24 DIAGNOSIS — E785 Hyperlipidemia, unspecified: Secondary | ICD-10-CM

## 2016-04-24 DIAGNOSIS — I429 Cardiomyopathy, unspecified: Secondary | ICD-10-CM | POA: Diagnosis not present

## 2016-04-24 NOTE — Patient Instructions (Signed)
Medication Instructions:   NO CHANGES.   Follow-Up: Your physician wants you to follow-up in: 6 MONTHS WITH DR CRENSHAW.  You will receive a reminder letter in the mail two months in advance. If you don't receive a letter, please call our office to schedule the follow-up appointment.   If you need a refill on your cardiac medications before your next appointment, please call your pharmacy.   

## 2016-04-28 ENCOUNTER — Other Ambulatory Visit: Payer: Self-pay | Admitting: Cardiology

## 2016-04-28 DIAGNOSIS — E785 Hyperlipidemia, unspecified: Secondary | ICD-10-CM

## 2016-04-28 DIAGNOSIS — I5022 Chronic systolic (congestive) heart failure: Secondary | ICD-10-CM

## 2016-04-28 DIAGNOSIS — I493 Ventricular premature depolarization: Secondary | ICD-10-CM

## 2016-04-28 DIAGNOSIS — I10 Essential (primary) hypertension: Secondary | ICD-10-CM

## 2016-04-28 DIAGNOSIS — Z95810 Presence of automatic (implantable) cardiac defibrillator: Secondary | ICD-10-CM

## 2016-04-28 DIAGNOSIS — I429 Cardiomyopathy, unspecified: Secondary | ICD-10-CM

## 2016-04-28 NOTE — Telephone Encounter (Signed)
REFILL 

## 2016-05-15 ENCOUNTER — Emergency Department
Admission: EM | Admit: 2016-05-15 | Discharge: 2016-05-15 | Disposition: A | Discharge status: Home / Self Care | Payer: Medicare Other | Attending: Emergency Medicine | Admitting: Emergency Medicine

## 2016-05-15 DIAGNOSIS — I130 Hypertensive heart and chronic kidney disease with heart failure and stage 1 through stage 4 chronic kidney disease, or unspecified chronic kidney disease: Secondary | ICD-10-CM | POA: Insufficient documentation

## 2016-05-15 DIAGNOSIS — I5022 Chronic systolic (congestive) heart failure: Secondary | ICD-10-CM | POA: Insufficient documentation

## 2016-05-15 DIAGNOSIS — J45909 Unspecified asthma, uncomplicated: Secondary | ICD-10-CM | POA: Insufficient documentation

## 2016-05-15 DIAGNOSIS — H109 Unspecified conjunctivitis: Secondary | ICD-10-CM

## 2016-05-15 DIAGNOSIS — N189 Chronic kidney disease, unspecified: Secondary | ICD-10-CM | POA: Diagnosis not present

## 2016-05-15 DIAGNOSIS — Z79899 Other long term (current) drug therapy: Secondary | ICD-10-CM | POA: Diagnosis not present

## 2016-05-15 DIAGNOSIS — H578 Other specified disorders of eye and adnexa: Secondary | ICD-10-CM | POA: Diagnosis present

## 2016-05-15 DIAGNOSIS — Z87891 Personal history of nicotine dependence: Secondary | ICD-10-CM | POA: Diagnosis not present

## 2016-05-15 MED ORDER — FLUORESCEIN SODIUM 1 MG OP STRP
ORAL_STRIP | OPHTHALMIC | Status: AC
  Administered 2016-05-15: 1
  Filled 2016-05-15: qty 1

## 2016-05-15 MED ORDER — POLYMYXIN B-TRIMETHOPRIM 10000-0.1 UNIT/ML-% OP SOLN: 1.0000 [drp] | OPHTHALMIC | 0 refills | Status: DC

## 2016-05-15 MED ORDER — POLYMYXIN B-TRIMETHOPRIM 10000-0.1 UNIT/ML-% OP SOLN
2.0000 [drp] | OPHTHALMIC | Status: DC
  Administered 2016-05-15: 2 [drp] via OPHTHALMIC
  Filled 2016-05-15: qty 10

## 2016-05-15 MED ORDER — TETRACAINE HCL 0.5 % OP SOLN
OPHTHALMIC | Status: AC
  Administered 2016-05-15: 2 [drp]
  Filled 2016-05-15: qty 2

## 2016-05-15 NOTE — ED Provider Notes (Addendum)
[facility] Emergency Department Provider Note  ____________________________________________   I have reviewed the triage vital signs and the nursing notes.   HISTORY  Chief Complaint Conjunctivitis    HPI [name] is a 70 y.o. male he states that he does not wear contacts. He was mowing the lawn yesterday had no problem then woke up in the middle the night with a scratchy feeling in his eye. He does not have any significant pain. He denies any nausea or vomiting. He states his eye feels a little bit blurry and red. Does have a little bit of rhinorrhea.Denies foreign body sensation just states the entire eye feels somewhat "scratchy.    Past Medical History:  Diagnosis Date  . Abnormal chest CT   . Asthma   . Benign hypertension   . Cardiomyopathy   . CHF (congestive heart failure) (HCC)   . Chronic renal insufficiency    With creatinine 1.4  . GERD (gastroesophageal reflux disease)   . Hives    Long Hx of hives  . Hyperlipidemia   . NSVT (nonsustained ventricular tachycardia) (HCC)   . Osteoarthritis   . Osteoarthrosis, unspecified whether generalized or localized, lower leg   . Osteoporosis   . Psoriasis   . PVC (premature ventricular contraction)   . 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 Abdominal pain 04/20/2015  . Bruit 08/29/2013  . 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 Chronic systolic heart failure (HCC) 09/09/2011  . Essential hypertension 02/15/2009  . OTHER PRIMARY CARDIOMYOPATHIES 02/15/2009  . LUNG NODULE 02/15/2009  . DIZZINESS 02/15/2009  . Nonspecific (abnormal) findings on radiological and other examination of body structure 02/15/2009  . IMPLANTATION OF DEFIBRILLATOR, HX OF 02/15/2009  . Nonspecific abnormal findings on radiological and other examination of skull and head 02/15/2009  . HYPERLIPIDEMIA-MIXED 10/27/2008   . RHEUMATIC FEVER 10/27/2008  . PREMATURE VENTRICULAR CONTRACTIONS 10/27/2008  . OTHER OSTEOPOROSIS 10/27/2008    Past Surgical History:  Procedure Laterality Date  . BIV ICD GENERTAOR CHANGE OUT N/A 10/08/2012   Procedure: BIV ICD GENERTAOR CHANGE OUT;  Surgeon: Marinus MawGregg W Taylor, MD;  Location: [facility] CATH LAB;  Service: Cardiovascular;  Laterality: N/A;  . CARDIAC CATHETERIZATION    . CARDIAC DEFIBRILLATOR REMOVAL  2014  . CATARACT EXTRACTION    . CATARACT EXTRACTION W/PHACO Left 03/12/2016   Procedure: CATARACT EXTRACTION PHACO AND INTRAOCULAR LENS PLACEMENT (IOC);  Surgeon: Sallee LangeSteven Dingeldein, MD;  Location: [facility] ORS;  Service: Ophthalmology;  Laterality: Left;  US 01:32 AP% 25.7 CDE 41.33 fluid pack lot # [id number]  . Implantation of dual-chamber ICD.     St. Jude DDD-ICD 1/07  . KNEE ARTHROPLASTY Left 2006  . KNEE ARTHROPLASTY Right 12/10   x2    Prior to Admission medications   Medication Sig Start Date End Date Taking? Authorizing Provider  amiodarone (PACERONE) 200 MG tablet Take 1 tablet (200 mg total) by mouth daily. 10/30/15   Lewayne BuntingBrian S Crenshaw, MD  atorvastatin (LIPITOR) 40 MG tablet Take 0.5 tablets (20 mg total) by mouth daily. 10/30/15   Lewayne BuntingBrian S Crenshaw, MD  carvedilol (COREG) 25 MG tablet Take 2 tablets (50 mg total) by mouth 2 (two) times daily with a meal. 10/30/15   Lewayne BuntingBrian S Crenshaw, MD  EPIPEN 2-PAK 0.3 MG/0.3ML DEVI Inject 0.3 mg into the skin daily as needed. For anaphylaxis 07/04/11   Historical Provider, MD  furosemide (LASIX)  20 MG tablet Take 2 tablets (40 mg total) by mouth daily. Take an extra 20mg  daily as needed 04/28/16   Rollene Rotunda, MD  gabapentin (NEURONTIN) 100 MG capsule Take 100 mg by mouth at bedtime. Take 1-2 tablets at bedtime. 04/02/16   Historical Provider, MD  hydrALAZINE (APRESOLINE) 50 MG tablet TAKE 1 & 1/2 TABLET BY MOUTH 3 TIMES A DAY 10/17/15   Lewayne Bunting, MD  isosorbide mononitrate (IMDUR) 30 MG 24 hr tablet Take 1 tablet (30 mg total) by  mouth daily. 03/21/13 02/27/17  Lewayne Bunting, MD  KLOR-CON M20 20 MEQ tablet TAKE 0.5 TABLETS (10 MEQ TOTAL) BY MOUTH DAILY. 03/24/16   Lewayne Bunting, MD  montelukast (SINGULAIR) 10 MG tablet Take 10 mg by mouth every morning.     Historical Provider, MD  OXYGEN Inhale 2L for 8 hours at night.    Historical Provider, MD  pantoprazole (PROTONIX) 40 MG tablet Take 40 mg by mouth daily.     Historical Provider, MD  potassium chloride (K-DUR) 10 MEQ tablet Take 10 mEq by mouth daily.    Historical Provider, MD    Allergies Ace inhibitors; Aspirin; Indocin [indomethacin]; Mobic [meloxicam]; and Nsaids  Family History  Problem Relation Age of Onset  . Heart attack Father   . Lung cancer Father     Social History Social History  Substance Use Topics  . Smoking status: Former Smoker    Packs/day: 1.00    Years: 10.00    Types: Cigarettes    Quit date: 12/31/1970  . Smokeless tobacco: Never Used  . Alcohol use 0.0 oz/week     Comment: 2 DRINKS PER DAY, none this week (7/26)    Review of Systems Constitutional: No fever/chills Eyes: See history of present illness ENT: No sore throat. No stiff neck no neck pain Cardiovascular: Denies chest pain. Respiratory: Denies shortness of breath. Gastrointestinal:   no vomiting.  No diarrhea.  No constipation. Genitourinary: Negative for dysuria. Musculoskeletal: Negative lower extremity swelling Skin: Negative for rash. Neurological: Negative for severe headaches, focal weakness or numbness. 10-point ROS otherwise negative.  ____________________________________________   PHYSICAL EXAM:  VITAL SIGNS: ED Triage Vitals  Enc Vitals Group     BP 05/15/16 0406 (!) 141/74     Pulse Rate 05/15/16 0406 68     Resp 05/15/16 0406 18     Temp 05/15/16 0406 98.4 F (36.9 C)     Temp Source 05/15/16 0406 Oral     SpO2 05/15/16 0406 97 %     Weight 05/15/16 0405 216 lb (98 kg)     Height 05/15/16 0405 5\' 9"  (1.753 m)     Head Circumference  --      Peak Flow --      Pain Score 05/15/16 0405 7     Pain Loc --      Pain Edu? --      Excl. in GC? --     Constitutional: Alert and oriented. Well appearing and in no acute distress. Eyes: Pupils are equally round and reactive to light, anterior chamber is quiet and deep no foreign body seen on lid eversion after tetracaine and fluorescein,, there is no dye uptake, extraocular muscles are intact, there is no Seidel sign. Conjunctiva is injected on the right. Ocular pressure per Tono-Pen is 15 Head: Atraumatic. Nose: Mild rhinorrhea noted congestion/rhinnorhea.  Neurologic:  Normal speech and language. No gross focal neurologic deficits are appreciated.  Skin:  Skin is warm, dry  and intact. No rash noted. Psychiatric: Mood and affect are normal. Speech and behavior are normal.  ____________________________________________   LABS (all labs ordered are listed, but only abnormal results are displayed)  Labs Reviewed - No data to display ____________________________________________  EKG  I personally interpreted any EKGs ordered by me or triage  ____________________________________________  RADIOLOGY  I reviewed any imaging ordered by me or triage that were performed during my shift and, if possible, patient and/or family made aware of any abnormal findings. ____________________________________________   PROCEDURES  Procedure(s) performed: None  Procedures  Critical Care performed: None  ____________________________________________   INITIAL IMPRESSION / ASSESSMENT AND PLAN / ED COURSE  Pertinent labs & imaging results that were available during my care of the patient were reviewed by me and considered in my medical decision making (see chart for details).  Patient with "scratchy" eye after mowing the lawn yesterday. Could be allergic rhinitis, could be conjunctivitis, no evidence of globe rupture and low suspicion for glaucoma or acute ocular issue at this time.  We will start him on Polytrim. Patient does have an eye doctor, who is initially just next to this one. I'll advise that he go there first thing in the morning and follow-up closely. Return precautions and follow-up given and understood.  ----------------------------------------- 6:32 AM on 05/15/2016 -----------------------------------------  Discussed with Dr. Brooke Dare in the eye center, who will see the patient in the eye center at 8:00 this morning this has been relayed to the patient.  Clinical Course   ____________________________________________   FINAL CLINICAL IMPRESSION(S) / ED DIAGNOSES  Final diagnoses:  None      This chart was dictated using voice recognition software.  Despite best efforts to proofread,  errors can occur which can change meaning.      Jeanmarie Plant, MD 05/15/16 1308    Jeanmarie Plant, MD 05/15/16 [id number]    Jeanmarie Plant, MD 05/15/16 [id number]    Jeanmarie Plant, MD 05/15/16 [phone]

## 2016-05-15 NOTE — ED Triage Notes (Addendum)
Patient ambulatory to triage with steady gait, without difficulty or distress noted; pt reports awakening with pain & itching to right eye; st "feels scratchy"; redness noted; visual acuity left eye 20/20 and right eye 20/70

## 2016-08-07 ENCOUNTER — Other Ambulatory Visit: Payer: Self-pay | Admitting: Cardiology

## 2016-08-07 DIAGNOSIS — E785 Hyperlipidemia, unspecified: Secondary | ICD-10-CM

## 2016-08-07 DIAGNOSIS — I429 Cardiomyopathy, unspecified: Secondary | ICD-10-CM

## 2016-08-07 DIAGNOSIS — I5022 Chronic systolic (congestive) heart failure: Secondary | ICD-10-CM

## 2016-08-07 DIAGNOSIS — I10 Essential (primary) hypertension: Secondary | ICD-10-CM

## 2016-08-07 DIAGNOSIS — Z95810 Presence of automatic (implantable) cardiac defibrillator: Secondary | ICD-10-CM

## 2016-08-07 DIAGNOSIS — I493 Ventricular premature depolarization: Secondary | ICD-10-CM

## 2016-09-20 ENCOUNTER — Other Ambulatory Visit: Payer: Self-pay | Admitting: Cardiology

## 2016-09-28 ENCOUNTER — Other Ambulatory Visit: Payer: Self-pay | Admitting: Cardiology

## 2016-09-29 MED ORDER — HYDRALAZINE HCL 50 MG PO TABS: ORAL_TABLET | ORAL | 2 refills | Status: DC

## 2016-10-06 DIAGNOSIS — E538 Deficiency of other specified B group vitamins: Secondary | ICD-10-CM | POA: Insufficient documentation

## 2016-10-06 DIAGNOSIS — Z0000 Encounter for general adult medical examination without abnormal findings: Secondary | ICD-10-CM | POA: Insufficient documentation

## 2016-10-16 NOTE — Progress Notes (Signed)
HPI: FU nonischemic cardiomyopathy. Cardiac cath at [facility] in 2004 showed normal coronaries and EF 24; endocardial biopsy unrevealing. 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 Previous holter monitor in Dec 2012 showed frequent PVCs and nonsustained ventricular tachycardia. He also has had a previous ICD (generator removed in February of 2014 as his device had reached ERI and his LV function had improved). Abd ultrasound 1/15 showed no aneurysm. Echocardiogram repeated September 2016 and showed ejection fraction 45-50%, mild aortic insufficiency, mild biatrial enlargement, moderate tricuspid regurgitation and mildly elevated pulmonary pressures. Since last seen, patient denies dyspnea on exertion, chest pain or syncope. Occasional orthopnea and mild pedal edema. However he does not take his Lasix daily.  Current Outpatient Prescriptions  Medication Sig Dispense Refill  . amiodarone (PACERONE) 200 MG tablet Take 1 tablet (200 mg total) by mouth daily. 90 tablet 3  . atorvastatin (LIPITOR) 40 MG tablet Take 0.5 tablets (20 mg total) by mouth daily. 90 tablet 3  . carvedilol (COREG) 25 MG tablet Take 2 tablets (50 mg total) by mouth 2 (two) times daily with a meal. 360 tablet 3  . EPIPEN 2-PAK 0.3 MG/0.3ML DEVI Inject 0.3 mg into the skin daily as needed. For anaphylaxis    . furosemide (LASIX) 20 MG tablet TAKE 2 TABLETS (40 MG TOTAL) BY MOUTH DAILY. TAKE AN EXTRA 20MG  DAILY AS NEEDED 180 tablet 1  . gabapentin (NEURONTIN) 100 MG capsule Take 100 mg by mouth at bedtime. Take 1-2 tablets at bedtime.    . hydrALAZINE (APRESOLINE) 50 MG tablet Take 1 & 1/2 tablet by mouth 3 times daily 405 tablet 2  . isosorbide mononitrate (IMDUR) 30 MG 24 hr tablet Take 1 tablet (30 mg total) by mouth daily.    Marland Kitchen KLOR-CON M20 20 MEQ tablet TAKE 0.5 TABLETS (10 MEQ TOTAL) BY MOUTH DAILY. 45 tablet 1  . montelukast (SINGULAIR) 10 MG tablet  Take 10 mg by mouth every morning.     . OXYGEN Inhale 2L for 8 hours at night.    . pantoprazole (PROTONIX) 40 MG tablet Take 40 mg by mouth daily.     . potassium chloride (K-DUR) 10 MEQ tablet Take 10 mEq by mouth daily.     No current facility-administered medications for this visit.      Past Medical History:  Diagnosis Date  . Abnormal chest CT   . Asthma   . Benign hypertension   . Cardiomyopathy   . CHF (congestive heart failure) (HCC)   . Chronic renal insufficiency    With creatinine 1.4  . GERD (gastroesophageal reflux disease)   . Hives    Long Hx of hives  . Hyperlipidemia   . NSVT (nonsustained ventricular tachycardia) (HCC)   . Osteoarthritis   . Osteoarthrosis, unspecified whether generalized or localized, lower leg   . Osteoporosis   . Psoriasis   . PVC (premature ventricular contraction)   . 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 BIV ICD GENERTAOR CHANGE OUT N/A 10/08/2012   Procedure: BIV ICD GENERTAOR CHANGE OUT;  Surgeon: Marinus Maw, MD;  [facility];  Service: Cardiovascular;  Laterality: N/A;  . CARDIAC CATHETERIZATION    . CARDIAC DEFIBRILLATOR REMOVAL  2014  . CATARACT EXTRACTION    . CATARACT EXTRACTION W/PHACO Left 03/12/2016   Procedure: CATARACT EXTRACTION PHACO  AND INTRAOCULAR LENS PLACEMENT (IOC);  Surgeon: Sallee Lange, MD;  Location: [facility] ORS;  Service: Ophthalmology;  Laterality: Left;  [place] 01:32 AP% 25.7 CDE 41.33 fluid pack lot # [id number]  . Implantation of dual-chamber ICD.     St. Jude DDD-ICD 1/07  . KNEE ARTHROPLASTY Left 2006  . KNEE ARTHROPLASTY Right 12/10   x2    Social History   Social History  . Marital status: Married    Spouse name: N/A  . Number of children: N/A  . Years of education: N/A   Occupational History  . Not on file.   Social History Main Topics  . Smoking status: Former Smoker    Packs/day: 1.00    Years: 10.00     Types: Cigarettes    Quit date: 12/31/1970  . Smokeless tobacco: Never Used  . Alcohol use 0.0 oz/week     Comment: 2 DRINKS PER DAY, none this week (7/26)  . Drug use: No  . Sexual activity: Not on file   Other Topics Concern  . Not on file   Social History Narrative  . No narrative on file    Family History  Problem Relation Age of Onset  . Heart attack Father   . Lung cancer Father     ROS: no fevers or chills, productive cough, hemoptysis, dysphasia, odynophagia, melena, hematochezia, dysuria, hematuria, rash, seizure activity, claudication. Remaining systems are negative.  Physical Exam: Well-developed obese in no acute distress.  Skin is warm and dry.  HEENT is normal.  Neck is supple.  Chest is clear to auscultation with normal expansion.  Cardiovascular exam is regular rate and rhythm.  Abdominal exam nontender or distended. No masses palpated. Extremities show trace ankle edema. neuro grossly intact  ECG- Sinus bradycardia at a rate of 50, first-degree AV block, prolonged QT interval; personally reviewed  A/P  1 Chronic systolic congestive heart failure-patient is mildly volume overloaded on examination. However he is not taking his Lasix daily. I asked him to take his Lasix regularly and we discussed fluid restriction and sodium restriction.  2 hypertension-blood pressure controlled. Continue present medications.  3 hyperlipidemia-continue statin.  4 cardiomyopathy-continue hydralazine/nitrates and beta blocker. Patient has an allergy to ace inhibitors and ARB's. Repeat echocardiogram.  5 PVCs-continue amiodarone. TSH and liver functions checked recently by Dr. Hyacinth Meeker. Check PA and lateral chest x-ray.   6 chronic stage III kidney disease-renal function monitored by Dr. Hyacinth Meeker.  Olga Millers, MD

## 2016-10-21 ENCOUNTER — Ambulatory Visit (INDEPENDENT_AMBULATORY_CARE_PROVIDER_SITE_OTHER): Payer: Medicare Other | Admitting: Cardiology

## 2016-10-21 ENCOUNTER — Encounter: Payer: Self-pay | Admitting: Cardiology

## 2016-10-21 VITALS — BP 134/86 | HR 50 | Ht 68.0 in | Wt 217.0 lb

## 2016-10-21 DIAGNOSIS — I493 Ventricular premature depolarization: Secondary | ICD-10-CM

## 2016-10-21 DIAGNOSIS — I5022 Chronic systolic (congestive) heart failure: Secondary | ICD-10-CM | POA: Diagnosis not present

## 2016-10-21 DIAGNOSIS — I429 Cardiomyopathy, unspecified: Secondary | ICD-10-CM | POA: Diagnosis not present

## 2016-10-21 NOTE — Patient Instructions (Signed)
Medication Instructions:   NO CHANGE  Testing/Procedures:  A chest x-ray takes a picture of the organs and structures inside the chest, including the heart, lungs, and blood vessels. This test can show several things, including, whether the heart is enlarges; whether fluid is building up in the lungs; and whether pacemaker / defibrillator leads are still in place. AT [facility]  Your physician has requested that you have an echocardiogram. Echocardiography is a painless test that uses sound waves to create images of your heart. It provides your doctor with information about the size and shape of your heart and how well your heart's chambers and valves are working. This procedure takes approximately one hour. There are no restrictions for this procedure.    Follow-Up:  Your physician wants you to follow-up in: 6 MONTHS WITH DR Jens Som You will receive a reminder letter in the mail two months in advance. If you don't receive a letter, please call our office to schedule the follow-up appointment.   If you need a refill on your cardiac medications before your next appointment, please call your pharmacy.

## 2016-11-06 ENCOUNTER — Ambulatory Visit (HOSPITAL_COMMUNITY)
Admission: RE | Admit: 2016-11-06 | Discharge: 2016-11-06 | Disposition: A | Discharge status: Home / Self Care | Payer: Medicare Other | Source: Ambulatory Visit | Attending: Cardiology | Admitting: Cardiology

## 2016-11-06 ENCOUNTER — Other Ambulatory Visit: Payer: Self-pay

## 2016-11-06 ENCOUNTER — Ambulatory Visit (HOSPITAL_COMMUNITY): Payer: Medicare Other | Attending: Cardiovascular Disease

## 2016-11-06 DIAGNOSIS — I429 Cardiomyopathy, unspecified: Secondary | ICD-10-CM | POA: Diagnosis not present

## 2016-11-06 DIAGNOSIS — I509 Heart failure, unspecified: Secondary | ICD-10-CM | POA: Insufficient documentation

## 2016-11-06 DIAGNOSIS — I700 Atherosclerosis of aorta: Secondary | ICD-10-CM | POA: Diagnosis not present

## 2016-11-06 DIAGNOSIS — I083 Combined rheumatic disorders of mitral, aortic and tricuspid valves: Secondary | ICD-10-CM | POA: Diagnosis not present

## 2016-11-06 DIAGNOSIS — J45909 Unspecified asthma, uncomplicated: Secondary | ICD-10-CM | POA: Diagnosis not present

## 2016-11-06 DIAGNOSIS — I130 Hypertensive heart and chronic kidney disease with heart failure and stage 1 through stage 4 chronic kidney disease, or unspecified chronic kidney disease: Secondary | ICD-10-CM | POA: Diagnosis not present

## 2016-11-06 DIAGNOSIS — E785 Hyperlipidemia, unspecified: Secondary | ICD-10-CM | POA: Diagnosis not present

## 2016-11-06 DIAGNOSIS — N189 Chronic kidney disease, unspecified: Secondary | ICD-10-CM | POA: Insufficient documentation

## 2016-11-13 ENCOUNTER — Other Ambulatory Visit: Payer: Self-pay | Admitting: Cardiology

## 2016-11-27 ENCOUNTER — Other Ambulatory Visit: Payer: Self-pay | Admitting: Cardiology

## 2016-11-27 DIAGNOSIS — E785 Hyperlipidemia, unspecified: Secondary | ICD-10-CM

## 2017-04-27 NOTE — Progress Notes (Signed)
HPI: FU nonischemic cardiomyopathy. Cardiac cath at [facility] in 2004 showed normal coronaries and EF 24; endocardial biopsy unrevealing. 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 Previous holter monitor in Dec 2012 showed frequent PVCs and nonsustained ventricular tachycardia. He also has had a previous ICD (generator removed in February of 2014 as his device had reached ERI and his LV function had improved). Abd ultrasound 1/15 showed no aneurysm. Echocardiogram repeated March 2018 and showed ejection fraction 35-40%, mild left ventricular enlargement, mild left ventricular hypertrophy, grade 1 diastolic dysfunction, mild aortic and mitral regurgitation, biatrial enlargement and mild right ventricular enlargement. Laboratories August 2018 personally reviewed and showed hemoglobin 13.2, creatinine 1.5, potassium 4.0, normal liver functions, and TSH 2.257. Since last seen, the patient denies any dyspnea on exertion, orthopnea, PND, pedal edema, palpitations, syncope or chest pain.   Current Outpatient Prescriptions  Medication Sig Dispense Refill  . amiodarone (PACERONE) 200 MG tablet TAKE 1 TABLET (200 MG TOTAL) BY MOUTH DAILY. 90 tablet 3  . atorvastatin (LIPITOR) 40 MG tablet TAKE 0.5 TABLETS (20 MG TOTAL) BY MOUTH DAILY. 90 tablet 2  . carvedilol (COREG) 25 MG tablet TAKE 2 TABLETS (50 MG TOTAL) BY MOUTH 2 (TWO) TIMES DAILY WITH A MEAL. 360 tablet 3  . Cyanocobalamin (VITAMIN B12 SL) Place 1 tablet under the tongue daily.    Marland Kitchen EPIPEN 2-PAK 0.3 MG/0.3ML DEVI Inject 0.3 mg into the skin daily as needed. For anaphylaxis    . furosemide (LASIX) 20 MG tablet TAKE 2 TABLETS (40 MG TOTAL) BY MOUTH DAILY. TAKE AN EXTRA  DAILY AS NEEDED 180 tablet 1  . gabapentin (NEURONTIN) 100 MG capsule Take 100 mg by mouth at bedtime. Take 1-2 tablets at bedtime.    . hydrALAZINE (APRESOLINE) 50 MG tablet Take 1 & 1/2 tablet by mouth 3  times daily 405 tablet 2  . KLOR-CON M20 20 MEQ tablet TAKE 0.5 TABLETS (10 MEQ TOTAL) BY MOUTH DAILY. 45 tablet 1  . montelukast (SINGULAIR) 10 MG tablet Take 10 mg by mouth every morning.     . OXYGEN Inhale 2L for 8 hours at night.    . pantoprazole (PROTONIX) 40 MG tablet Take 40 mg by mouth daily.     . potassium chloride (K-DUR) 10 MEQ tablet Take 10 mEq by mouth daily.    . isosorbide mononitrate (IMDUR) 30 MG 24 hr tablet Take 1 tablet (30 mg total) by mouth daily.     No current facility-administered medications for this visit.      Past Medical History:  Diagnosis Date  . Abnormal chest CT   . Asthma   . Benign hypertension   . Cardiomyopathy   . CHF (congestive heart failure) (HCC)   . Chronic renal insufficiency    With creatinine 1.4  . GERD (gastroesophageal reflux disease)   . Hives    Long Hx of hives  . Hyperlipidemia   . NSVT (nonsustained ventricular tachycardia) (HCC)   . Osteoarthritis   . Osteoarthrosis, unspecified whether generalized or localized, lower leg   . Osteoporosis   . Psoriasis   . PVC (premature ventricular contraction)   . 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 BIV ICD GENERTAOR CHANGE OUT N/A 10/08/2012   Procedure: BIV ICD GENERTAOR CHANGE OUT;  Surgeon: Marinus Maw, MD;  Location: [facility] CATH LAB;  Service: Cardiovascular;  Laterality: N/A;  . CARDIAC CATHETERIZATION    . CARDIAC DEFIBRILLATOR REMOVAL  2014  . CATARACT EXTRACTION    . CATARACT EXTRACTION W/PHACO Left 03/12/2016   Procedure: CATARACT EXTRACTION PHACO AND INTRAOCULAR LENS PLACEMENT (IOC);  Surgeon: Sallee Lange, MD;  Location: [facility] ORS;  Service: Ophthalmology;  Laterality: Left;  [place] 01:32 AP% 25.7 CDE 41.33 fluid pack lot # [id number]  . Implantation of dual-chamber ICD.     St. Jude DDD-ICD 1/07  . KNEE ARTHROPLASTY Left 2006  . KNEE ARTHROPLASTY Right 12/10   x2    Social History   Social History   . Marital status: Married    Spouse name: N/A  . Number of children: N/A  . Years of education: N/A   Occupational History  . Not on file.   Social History Main Topics  . Smoking status: Former Smoker    Packs/day: 1.00    Years: 10.00    Types: Cigarettes    Quit date: 12/31/1970  . Smokeless tobacco: Never Used  . Alcohol use 0.0 oz/week     Comment: 2 DRINKS PER DAY, none this week (7/26)  . Drug use: No  . Sexual activity: Not on file   Other Topics Concern  . Not on file   Social History Narrative  . No narrative on file    Family History  Problem Relation Age of Onset  . Heart attack Father   . Lung cancer Father     ROS: no fevers or chills, productive cough, hemoptysis, dysphasia, odynophagia, melena, hematochezia, dysuria, hematuria, rash, seizure activity, orthopnea, PND, pedal edema, claudication. Remaining systems are negative.  Physical Exam: Well-developed well-nourished in no acute distress.  Skin is warm and dry.  HEENT is normal.  Neck is supple.  Chest is clear to auscultation with normal expansion.  Cardiovascular exam is regular rate and rhythm.  Abdominal exam nontender or distended. No masses palpated. Extremities show no edema. neuro grossly intact   A/P  1 Chronic systolic congestive heart failure-patient's volume status appears to be stable. Continue present dose of diuretic. We discussed the importance of fluid restriction and low sodium diet.  2 Cardiomyopathy-continue beta blocker and hydralazine/nitrates. Patient with history of allergy to ace inhibitors/ARB's.  3 hypertension-blood pressure is controlled. Continue present medications.  4 hyperlipidemia-continue statin.  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 Check chest xray. Recent liver functions and TSH normal.   6 chronic stage III kidney disease-followed by Dr. Hyacinth Meeker.   Olga Millers, MD

## 2017-05-01 ENCOUNTER — Encounter: Payer: Self-pay | Admitting: Cardiology

## 2017-05-01 ENCOUNTER — Ambulatory Visit (HOSPITAL_COMMUNITY)
Admission: RE | Admit: 2017-05-01 | Discharge: 2017-05-01 | Disposition: A | Discharge status: Home / Self Care | Payer: Medicare Other | Source: Ambulatory Visit | Attending: Cardiology | Admitting: Cardiology

## 2017-05-01 ENCOUNTER — Ambulatory Visit (INDEPENDENT_AMBULATORY_CARE_PROVIDER_SITE_OTHER): Payer: Medicare Other | Admitting: Cardiology

## 2017-05-01 VITALS — BP 130/62 | HR 64 | Ht 68.0 in | Wt 199.0 lb

## 2017-05-01 DIAGNOSIS — I429 Cardiomyopathy, unspecified: Secondary | ICD-10-CM

## 2017-05-01 DIAGNOSIS — I700 Atherosclerosis of aorta: Secondary | ICD-10-CM | POA: Insufficient documentation

## 2017-05-01 DIAGNOSIS — E7800 Pure hypercholesterolemia, unspecified: Secondary | ICD-10-CM

## 2017-05-01 DIAGNOSIS — I5022 Chronic systolic (congestive) heart failure: Secondary | ICD-10-CM

## 2017-05-01 DIAGNOSIS — I10 Essential (primary) hypertension: Secondary | ICD-10-CM | POA: Diagnosis not present

## 2017-05-01 DIAGNOSIS — I517 Cardiomegaly: Secondary | ICD-10-CM | POA: Diagnosis not present

## 2017-05-01 NOTE — Patient Instructions (Signed)
Medication Instructions:   NO CHANGE  Testing/Procedures:  A chest x-ray takes a picture of the organs and structures inside the chest, including the heart, lungs, and blood vessels. This test can show several things, including, whether the heart is enlarges; whether fluid is building up in the lungs; and whether pacemaker / defibrillator leads are still in place.   Follow-Up:  Your physician wants you to follow-up in: 6 MONTHS WITH DR CRENSHAW You will receive a reminder letter in the mail two months in advance. If you don't receive a letter, please call our office to schedule the follow-up appointment.   If you need a refill on your cardiac medications before your next appointment, please call your pharmacy.    

## 2017-08-01 ENCOUNTER — Other Ambulatory Visit: Payer: Self-pay | Admitting: Cardiology

## 2017-08-03 NOTE — Telephone Encounter (Signed)
REFILL 

## 2017-10-13 ENCOUNTER — Other Ambulatory Visit: Payer: Self-pay | Admitting: Internal Medicine

## 2017-10-13 DIAGNOSIS — R0781 Pleurodynia: Secondary | ICD-10-CM

## 2017-10-13 DIAGNOSIS — R1012 Left upper quadrant pain: Secondary | ICD-10-CM

## 2017-10-23 ENCOUNTER — Ambulatory Visit
Admission: RE | Admit: 2017-10-23 | Discharge: 2017-10-23 | Disposition: A | Discharge status: Home / Self Care | Payer: Medicare Other | Source: Ambulatory Visit | Attending: Internal Medicine | Admitting: Internal Medicine

## 2017-10-23 DIAGNOSIS — I700 Atherosclerosis of aorta: Secondary | ICD-10-CM | POA: Insufficient documentation

## 2017-10-23 DIAGNOSIS — K5730 Diverticulosis of large intestine without perforation or abscess without bleeding: Secondary | ICD-10-CM | POA: Insufficient documentation

## 2017-10-23 DIAGNOSIS — K8020 Calculus of gallbladder without cholecystitis without obstruction: Secondary | ICD-10-CM | POA: Diagnosis not present

## 2017-10-23 DIAGNOSIS — R0781 Pleurodynia: Secondary | ICD-10-CM | POA: Insufficient documentation

## 2017-10-23 DIAGNOSIS — R1012 Left upper quadrant pain: Secondary | ICD-10-CM

## 2017-10-23 DIAGNOSIS — I712 Thoracic aortic aneurysm, without rupture: Secondary | ICD-10-CM | POA: Insufficient documentation

## 2017-10-23 MED ORDER — IOPAMIDOL (ISOVUE-300) INJECTION 61%
100.0000 mL | Freq: Once | INTRAVENOUS | Status: AC | PRN
  Administered 2017-10-23: 100 mL via INTRAVENOUS

## 2017-10-28 ENCOUNTER — Other Ambulatory Visit: Payer: Self-pay | Admitting: Internal Medicine

## 2017-10-28 DIAGNOSIS — M5414 Radiculopathy, thoracic region: Secondary | ICD-10-CM

## 2017-11-06 ENCOUNTER — Ambulatory Visit
Admission: RE | Admit: 2017-11-06 | Discharge: 2017-11-06 | Disposition: A | Discharge status: Home / Self Care | Payer: Medicare Other | Source: Ambulatory Visit | Attending: Internal Medicine | Admitting: Internal Medicine

## 2017-11-06 DIAGNOSIS — I2510 Atherosclerotic heart disease of native coronary artery without angina pectoris: Secondary | ICD-10-CM | POA: Insufficient documentation

## 2017-11-06 DIAGNOSIS — M5414 Radiculopathy, thoracic region: Secondary | ICD-10-CM

## 2017-11-06 DIAGNOSIS — R918 Other nonspecific abnormal finding of lung field: Secondary | ICD-10-CM | POA: Insufficient documentation

## 2017-11-08 ENCOUNTER — Other Ambulatory Visit: Payer: Self-pay | Admitting: Cardiology

## 2017-11-09 NOTE — Progress Notes (Signed)
HPI: FU nonischemic cardiomyopathy. Cardiac cath at [facility] in 2004 showed normal coronaries and EF 24; endocardial biopsy unrevealing. 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 Previous holter monitor in Dec 2012 showed frequent PVCs and nonsustained ventricular tachycardia. He also has had a previous ICD (generator removed in February of 2014 as his device had reached ERI and his LV function had improved). Abd ultrasound 1/15 showed no aneurysm. Echocardiogram repeated March 2018 and showed ejection fraction 35-40%, mild left ventricular enlargement, mild left ventricular hypertrophy, grade 1 diastolic dysfunction, mild aortic and mitral regurgitation, biatrial enlargement and mild right ventricular enlargement.  Chest CT October 23, 2017 showed 4.2 cm thoracic aortic aneurysm.  Patient had a follow-up chest CT March 2019 that showed new bilateral groundglass opacities compared to earlier in the month.  Patient was seen by Dr. Sung Amabile and follow-up chest CT and pulmonary function tests ordered.  Sed rate was 50; ANA and rheumatoid factor negative.  Since last seen,  patient denies dyspnea, chest pain, palpitations or syncope.  He has had weight loss of 50 pounds in 1 year.  Current Outpatient Medications  Medication Sig Dispense Refill  . amiodarone (PACERONE) 200 MG tablet TAKE 1 TABLET (200 MG TOTAL) BY MOUTH DAILY. 90 tablet 1  . atorvastatin (LIPITOR) 40 MG tablet TAKE 0.5 TABLETS (20 MG TOTAL) BY MOUTH DAILY. 90 tablet 2  . carvedilol (COREG) 25 MG tablet TAKE 2 TABLETS (50 MG TOTAL) BY MOUTH 2 (TWO) TIMES DAILY WITH A MEAL. 360 tablet 3  . Cyanocobalamin (VITAMIN B12 SL) Place 1 tablet under the tongue daily.    . furosemide (LASIX) 20 MG tablet TAKE 2 TABLETS (40 MG TOTAL) BY MOUTH DAILY. TAKE AN EXTRA 20MG  DAILY AS NEEDED 180 tablet 1  . hydrALAZINE (APRESOLINE) 50 MG tablet TAKE 1 & 1/2 TABLET BY MOUTH 3 TIMES DAILY  405 tablet 1  . isosorbide mononitrate (IMDUR) 30 MG 24 hr tablet Take 1 tablet (30 mg total) by mouth daily.    . OXYGEN Inhale 2L for 8 hours at night.    . pantoprazole (PROTONIX) 40 MG tablet Take 40 mg by mouth daily.     . potassium chloride (K-DUR) 10 MEQ tablet Take 10 mEq by mouth daily.    . Lido-Capsaicin-Men-Methyl Sal (1ST MEDX-PATCH/ LIDOCAINE) 4-0.025-5-20 % PTCH Apply 1 patch topically at bedtime. (Patient not taking: Reported on 11/23/2017) 30 patch 5   No current facility-administered medications for this visit.      Past Medical History:  Diagnosis Date  . Abnormal chest CT   . Benign hypertension   . Cardiomyopathy   . CHF (congestive heart failure) (HCC)   . Chronic renal insufficiency    With creatinine 1.4  . GERD (gastroesophageal reflux disease)   . Hives    Long Hx of hives  . Hyperlipidemia   . NSVT (nonsustained ventricular tachycardia) (HCC)   . Osteoarthritis   . Osteoarthrosis, unspecified whether generalized or localized, lower leg   . Osteoporosis   . Psoriasis   . PVC (premature ventricular contraction)   . 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 BIV ICD GENERTAOR CHANGE OUT N/A 10/08/2012   Procedure: BIV ICD GENERTAOR CHANGE OUT;  Surgeon: Marinus Maw, MD;  Location: [facility] CATH LAB;  Service: Cardiovascular;  Laterality: N/A;  . CARDIAC CATHETERIZATION    .  CARDIAC DEFIBRILLATOR REMOVAL  2014  . CATARACT EXTRACTION    . CATARACT EXTRACTION W/PHACO Left 03/12/2016   Procedure: CATARACT EXTRACTION PHACO AND INTRAOCULAR LENS PLACEMENT (IOC);  Surgeon: Sallee Lange, MD;  Location: [facility] ORS;  Service: Ophthalmology;  Laterality: Left;  [place] 01:32 AP% 25.7 CDE 41.33 fluid pack lot # [id number]  . Implantation of dual-chamber ICD.     St. Jude DDD-ICD 1/07  . KNEE ARTHROPLASTY Left 2006  . KNEE ARTHROPLASTY Right 12/10   x2    Social History   Socioeconomic History  . Marital  status: Married    Spouse name: Not on file  . Number of children: Not on file  . Years of education: Not on file  . Highest education level: Not on file  Occupational History  . Not on file  Social Needs  . Financial resource strain: Not on file  . Food insecurity:    Worry: Not on file    Inability: Not on file  . Transportation needs:    Medical: Not on file    Non-medical: Not on file  Tobacco Use  . Smoking status: Former Smoker    Packs/day: 1.00    Years: 10.00    Pack years: 10.00    Types: Cigarettes    Last attempt to quit: 12/31/1970    Years since quitting: 46.9  . Smokeless tobacco: Never Used  Substance and Sexual Activity  . Alcohol use: Yes    Alcohol/week: 0.0 oz    Comment: 2 DRINKS PER DAY, none this week (7/26)  . Drug use: No  . Sexual activity: Not on file  Lifestyle  . Physical activity:    Days per week: Not on file    Minutes per session: Not on file  . Stress: Not on file  Relationships  . Social connections:    Talks on phone: Not on file    Gets together: Not on file    Attends religious service: Not on file    Active member of club or organization: Not on file    Attends meetings of clubs or organizations: Not on file    Relationship status: Not on file  . Intimate partner violence:    Fear of current or ex partner: Not on file    Emotionally abused: Not on file    Physically abused: Not on file    Forced sexual activity: Not on file  Other Topics Concern  . Not on file  Social History Narrative  . Not on file    Family History  Problem Relation Age of Onset  . Heart attack Father   . Lung cancer Father     ROS: Weight loss but no fevers or chills, productive cough, hemoptysis, dysphasia, odynophagia, melena, hematochezia, dysuria, hematuria, rash, seizure activity, orthopnea, PND, pedal edema, claudication. Remaining systems are negative.  Physical Exam: Well-developed well-nourished in no acute distress.  Skin is warm and  dry.  HEENT is normal.  Neck is supple.  Chest is clear to auscultation with normal expansion.  Cardiovascular exam is regular rate and rhythm.  Abdominal exam nontender or distended. No masses palpated. Extremities show no edema. neuro grossly intact  ECG-sinus rhythm with first-degree AV block.  Nonspecific ST changes.  Personally reviewed  A/P  1 cardiomyopathy-nonischemic.  Plan to continue beta-blocker, hydralazine and nitrates.  Patient has an allergy to ACE inhibitors and ARBs.  2 chronic systolic congestive heart failure-patient is not volume overloaded on examination.  We will continue with present  dose of diuretics.  Continue low-sodium diet and fluid restriction.  3 hypertension-blood pressure is controlled.  Continue present medications.  4 history of frequent PVCs-plan to continue amiodarone.  Patient had a recent chest CT bilateral infiltrates.  He is scheduled to have follow-up chest CT in 12 weeks.  If this is felt secondary to amiodarone the future.  5 hyperlipidemia-continue statin.  6 chronic stage III kidney disease-followed by Dr. Hyacinth Meeker.  7 thoracic aortic aneurysm-he will need follow-up CTA March 2020.  Olga Millers, MD

## 2017-11-12 ENCOUNTER — Ambulatory Visit: Payer: Medicare Other | Admitting: Pulmonary Disease

## 2017-11-12 ENCOUNTER — Other Ambulatory Visit
Admission: RE | Admit: 2017-11-12 | Discharge: 2017-11-12 | Disposition: A | Discharge status: Home / Self Care | Payer: Medicare Other | Source: Ambulatory Visit | Attending: Pulmonary Disease | Admitting: Pulmonary Disease

## 2017-11-12 ENCOUNTER — Encounter: Payer: Self-pay | Admitting: Pulmonary Disease

## 2017-11-12 VITALS — BP 130/80 | HR 69 | Ht 68.0 in | Wt 192.0 lb

## 2017-11-12 DIAGNOSIS — R634 Abnormal weight loss: Secondary | ICD-10-CM | POA: Diagnosis not present

## 2017-11-12 DIAGNOSIS — I420 Dilated cardiomyopathy: Secondary | ICD-10-CM | POA: Diagnosis not present

## 2017-11-12 DIAGNOSIS — R918 Other nonspecific abnormal finding of lung field: Secondary | ICD-10-CM

## 2017-11-12 DIAGNOSIS — R071 Chest pain on breathing: Secondary | ICD-10-CM

## 2017-11-12 DIAGNOSIS — R0902 Hypoxemia: Secondary | ICD-10-CM

## 2017-11-12 LAB — SEDIMENTATION RATE: SED RATE: 50 mm/h — AB (ref 0–20)

## 2017-11-12 MED ORDER — LIDO-CAPSAICIN-MEN-METHYL SAL 4-0.025-5-20 % EX PTCH: 1.0000 | MEDICATED_PATCH | Freq: Every day | CUTANEOUS | 5 refills | Status: DC

## 2017-11-12 NOTE — Progress Notes (Signed)
PULMONARY CONSULT NOTE  Requesting MD/Service: Emily Filbert, MD Date of initial consultation: 11/12/17 Reason for consultation: L sided CP, new ground glass opacities on CT  PT PROFILE: 72 y.o. male former (remote) smoker with vague ground glass opacities noted on CT of T spine (after recent normal CT chest) which was performed to evaluate chronic L sided thoracic pain  DATA: 10/12/13 Spirometry: Mild obstruction with FEV1 77% predicted 12/12/14 PFTs Jefm Bryant): Spirometry not available, lung volumes normal, diffusion capacity normal 11/06/16 echocardiogram: LVEF 35-40%.  Mild LVH.  Basal-mid lateral and inferolateral hypokinesis 10/23/17 CTA chest: 4.2 cm ascending TAA. No pleural, chest wall or pulmonary findings to explain chest pain 11/06/17 CT T-spine: No spinal stenosis or neural impingement suspected. New multifocal bilateral pulmonary ground-glass opacity is new since the chest CT on 10/23/2017 and appears inflammatory   HPI:  CC is L sided chest pain that is intermittent but occurs daily with episodes lasting up to several hours. Pain is described as dull, aching. He believes it originates in his antero-lateral inframammary area and radiates towards his back (not vice versa). It is positional, worse when supine and can disrupt his sleep. Otherwise, he can identify no precipitating causes. He has been prescribed gabapentin without benefit. He has been prescribed tramadol which he has not tried. Acetaminophen gives him modest relief at best. He has not tried NSAIDs as there is some concern re: possible allergy to this class of medication. When the pain is present, it "catches his breath". Otherwise, he denies dyspnea and all other respiratory or pulmonary symptoms including cough, hemoptysis, fever, LE edema, calf tenderness. The above symptoms have been present for approx 1 year with little change over time. However, he has experienced significant weight loss without clear effort or  explanation.   He does have a dilated CM for which he is on amiodarone (followed by Dr Stanford Breed in [place]). He was previously prescribed O2 in 2015 by Dr Raul Del but doesn't know why and does not use it. He was prescribed Montelukast in the past but does not know why and is not currently taking this medication.   Past Medical History:  Diagnosis Date  . Abnormal chest CT   . Benign hypertension   . Cardiomyopathy   . CHF (congestive heart failure) ([place])   . Chronic renal insufficiency    With creatinine 1.4  . GERD (gastroesophageal reflux disease)   . Hives    Long Hx of hives  . Hyperlipidemia   . NSVT (nonsustained ventricular tachycardia) ([place])   . Osteoarthritis   . Osteoarthrosis, unspecified whether generalized or localized, lower leg   . Osteoporosis   . Psoriasis   . PVC (premature ventricular contraction)   . 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 BIV ICD GENERTAOR CHANGE OUT N/A 10/08/2012   Procedure: BIV ICD GENERTAOR CHANGE OUT;  Surgeon: Evans Lance, MD;  Location[facility] CATH LAB;  Service: Cardiovascular;  Laterality: N/A;  . CARDIAC CATHETERIZATION    . CARDIAC DEFIBRILLATOR REMOVAL  2014  . CATARACT EXTRACTION    . CATARACT EXTRACTION W/PHACO Left 03/12/2016   Procedure: CATARACT EXTRACTION PHACO AND INTRAOCULAR LENS PLACEMENT (IOC);  Surgeon: Estill Cotta, MD;  Location: [facility] ORS;  Service: Ophthalmology;  Laterality: Left;  [place] 01:32 AP% 25.7 CDE 41.33 fluid pack lot # [id number]  . Implantation of dual-chamber ICD.     St. Jude DDD-ICD 1/07  . KNEE ARTHROPLASTY Left 2006  .  KNEE ARTHROPLASTY Right 12/10   x2    MEDICATIONS: I have reviewed all medications and confirmed regimen as documented  Social History   Socioeconomic History  . Marital status: Married    Spouse name: Not on file  . Number of children: Not on file  . Years of education: Not on file  . Highest education level: Not on  file  Occupational History  . Not on file  Social Needs  . Financial resource strain: Not on file  . Food insecurity:    Worry: Not on file    Inability: Not on file  . Transportation needs:    Medical: Not on file    Non-medical: Not on file  Tobacco Use  . Smoking status: Former Smoker    Packs/day: 1.00    Years: 10.00    Pack years: 10.00    Types: Cigarettes    Last attempt to quit: 12/31/1970    Years since quitting: 46.9  . Smokeless tobacco: Never Used  Substance and Sexual Activity  . Alcohol use: Yes    Alcohol/week: 0.0 oz    Comment: 2 DRINKS PER DAY, none this week (7/26)  . Drug use: No  . Sexual activity: Not on file  Lifestyle  . Physical activity:    Days per week: Not on file    Minutes per session: Not on file  . Stress: Not on file  Relationships  . Social connections:    Talks on phone: Not on file    Gets together: Not on file    Attends religious service: Not on file    Active member of club or organization: Not on file    Attends meetings of clubs or organizations: Not on file    Relationship status: Not on file  . Intimate partner violence:    Fear of current or ex partner: Not on file    Emotionally abused: Not on file    Physically abused: Not on file    Forced sexual activity: Not on file  Other Topics Concern  . Not on file  Social History Narrative  . Not on file    Family History  Problem Relation Age of Onset  . Heart attack Father   . Lung cancer Father     ROS: No fever, myalgias/arthralgias No new focal weakness or sensory deficits No otalgia, hearing loss, visual changes, nasal and sinus symptoms, mouth and throat problems No neck pain or adenopathy No abdominal pain, N/V/D, diarrhea, change in bowel pattern No dysuria, change in urinary pattern   Vitals:   11/12/17 0849 11/12/17 0858  BP:  130/80  Pulse:  69  SpO2:  95%  Weight: 192 lb (87.1 kg)   Height: 5' 8"  (1.727 m)      EXAM:  Gen: No overt  respiratory distress HEENT: NCAT, sclera white, oropharynx normal Neck: Supple without LAN, thyromegaly, JVD Chest/Lungs: moderately tender to palpation over lower ribs anteriorly on L, breath sounds full, percussion normal, adventitious sounds: none Cardiovascular: RRR, +S3, no murmurs noted Abdomen: Soft, nontender, normal BS Ext: mild sarcopenia, trace pretibial edema Neuro: CNs grossly intact, motor and sensory intact Skin: Limited exam, no lesions noted  DATA:   BMP Latest Ref Rng & Units 08/02/2015 07/17/2014 04/12/2014  Glucose 70 - 99 mg/dL - 103(H) 82  BUN 6 - 23 mg/dL - 15 20  Creatinine 0.61 - 1.24 mg/dL 1.30(H) 1.62(H) 1.41(H)  Sodium 135 - 145 mEq/L - 139 142  Potassium 3.5 - 5.3  mEq/L - 4.1 4.2  Chloride 96 - 112 mEq/L - 99 104  CO2 19 - 32 mEq/L - 30 29  Calcium 8.4 - 10.5 mg/dL - 9.2 8.8    CBC Latest Ref Rng & Units 03/09/2014 10/09/2008  WBC 3.8 - 10.6 x10 3/mm 3 8.7 8.5  Hemoglobin 13.0 - 18.0 g/dL 14.1 13.6  Hematocrit 40.0 - 52.0 % 41.8 38.3(L)  Platelets 150 - 440 x10 3/mm 3 189 235    CXR 05/01/17:  No acute findings  I have personally reviewed all chest radiographs reported above including CXRs and CT chest unless otherwise indicated  IMPRESSION:   1) Chest pain of 1 yr's duration - appears to be chest wall pain without structural abnormalities noted on CT chest 2) Vague acute patchy GGOs noted incidentally on CT of T spine - these were not present on recent CT chest. They are not likely related to the CC of chest wall pain. They appear inflammatory but are accompanied by no respiratory symptoms. They could represent mild interstitial edema vs atypical infectious or non-infectious pneumonitis. Pt is notable on amiodarone 3) Unexplained weight loss 4) Dilated CM (on chronic amiodarone) - appears to be well compensated 5) Apparent history of hypoxemia (was prescribed O2 in 2015 which he is not currently using)   PLAN:  ESR, ANA, RF Ambulatory and  overnight oximetry Prescribed Lidoderm patch to be placed over site of pain PFTs ordered Will repeat CT chest in 8-12 weeks ROV 6 weeks after PFTs performed   Merton Border, MD PCCM service Mobile [phone] Pager [phone] 11/13/2017 10:28 PM

## 2017-11-12 NOTE — Patient Instructions (Addendum)
Diagnostic blood tests today: ESR, ANA, RF Ambulatory and overnight oximetry tests to assess whether we can get rid of oxygen Suggest lidocaine patch applied to area of tenderness placed at bedtime I will discuss further evaluation of chest pain with Dr. Sabra Heck Prior to follow-up, we will perform pony function tests  At some point, we will need to consider repeat diagnostic imaging of your lungs but for now, I do not believe that the very subtle lung findings on most recent CAT scan are anything to worry about  Follow-up in 6 weeks with pulmonary function tests prior to that visit

## 2017-11-13 ENCOUNTER — Encounter: Payer: Self-pay | Admitting: Pulmonary Disease

## 2017-11-13 LAB — ANA: ANA: NEGATIVE

## 2017-11-13 LAB — RHEUMATOID FACTOR: Rhuematoid fact SerPl-aCnc: 11.6 IU/mL (ref 0.0–13.9)

## 2017-11-18 ENCOUNTER — Encounter: Payer: Self-pay | Admitting: Pulmonary Disease

## 2017-11-23 ENCOUNTER — Ambulatory Visit: Payer: Medicare Other | Admitting: Cardiology

## 2017-11-23 ENCOUNTER — Encounter: Payer: Self-pay | Admitting: Cardiology

## 2017-11-23 VITALS — BP 132/68 | HR 69 | Ht 69.0 in | Wt 189.8 lb

## 2017-11-23 DIAGNOSIS — I429 Cardiomyopathy, unspecified: Secondary | ICD-10-CM | POA: Diagnosis not present

## 2017-11-23 DIAGNOSIS — I5022 Chronic systolic (congestive) heart failure: Secondary | ICD-10-CM

## 2017-11-23 DIAGNOSIS — I493 Ventricular premature depolarization: Secondary | ICD-10-CM | POA: Diagnosis not present

## 2017-11-23 DIAGNOSIS — I10 Essential (primary) hypertension: Secondary | ICD-10-CM | POA: Diagnosis not present

## 2017-11-23 NOTE — Patient Instructions (Signed)
Your physician wants you to follow-up in: 6 MONTHS WITH DR CRENSHAW You will receive a reminder letter in the mail two months in advance. If you don't receive a letter, please call our office to schedule the follow-up appointment.   If you need a refill on your cardiac medications before your next appointment, please call your pharmacy.  

## 2017-11-30 ENCOUNTER — Encounter: Payer: Self-pay | Admitting: Pulmonary Disease

## 2017-12-17 ENCOUNTER — Ambulatory Visit: Payer: Medicare Other | Attending: Pulmonary Disease

## 2017-12-17 DIAGNOSIS — R918 Other nonspecific abnormal finding of lung field: Secondary | ICD-10-CM | POA: Insufficient documentation

## 2017-12-23 ENCOUNTER — Ambulatory Visit
Admission: RE | Admit: 2017-12-23 | Discharge: 2017-12-23 | Disposition: A | Discharge status: Home / Self Care | Payer: Medicare Other | Source: Ambulatory Visit | Attending: Pulmonary Disease | Admitting: Pulmonary Disease

## 2017-12-23 ENCOUNTER — Encounter: Payer: Self-pay | Admitting: Pulmonary Disease

## 2017-12-23 ENCOUNTER — Ambulatory Visit: Payer: Medicare Other | Admitting: Pulmonary Disease

## 2017-12-23 VITALS — BP 118/70 | HR 56 | Ht 69.0 in | Wt 186.0 lb

## 2017-12-23 DIAGNOSIS — J449 Chronic obstructive pulmonary disease, unspecified: Secondary | ICD-10-CM

## 2017-12-23 DIAGNOSIS — I700 Atherosclerosis of aorta: Secondary | ICD-10-CM | POA: Insufficient documentation

## 2017-12-23 DIAGNOSIS — I712 Thoracic aortic aneurysm, without rupture: Secondary | ICD-10-CM | POA: Diagnosis not present

## 2017-12-23 DIAGNOSIS — Z87891 Personal history of nicotine dependence: Secondary | ICD-10-CM | POA: Diagnosis not present

## 2017-12-23 DIAGNOSIS — R0789 Other chest pain: Secondary | ICD-10-CM | POA: Diagnosis present

## 2017-12-23 DIAGNOSIS — K8020 Calculus of gallbladder without cholecystitis without obstruction: Secondary | ICD-10-CM | POA: Insufficient documentation

## 2017-12-23 DIAGNOSIS — R634 Abnormal weight loss: Secondary | ICD-10-CM | POA: Diagnosis not present

## 2017-12-23 DIAGNOSIS — J439 Emphysema, unspecified: Secondary | ICD-10-CM | POA: Diagnosis not present

## 2017-12-23 LAB — POCT I-STAT CREATININE: Creatinine, Ser: 1.3 mg/dL — ABNORMAL HIGH (ref 0.61–1.24)

## 2017-12-23 MED ORDER — PREDNISONE 20 MG PO TABS: 40.0000 mg | ORAL_TABLET | Freq: Every day | ORAL | 0 refills | Status: AC

## 2017-12-23 MED ORDER — IOPAMIDOL (ISOVUE-370) INJECTION 76%
75.0000 mL | Freq: Once | INTRAVENOUS | Status: AC | PRN
  Administered 2017-12-23: 75 mL via INTRAVENOUS

## 2017-12-23 NOTE — Patient Instructions (Signed)
Blood tests ordered today: D-dimer, BMP CT angiogram of chest ordered today to rule out pulmonary embolism and reimage pleura and chest wall I will discuss with [name] whether we can consider discontinuation of amiodarone Continue current management of pain with acetaminophen and Lidoderm patch If the CT angiogram of the chest does not reveal a cause of the pain, I will consider referral to pain management clinic Follow-up in 4-6 weeks

## 2017-12-23 NOTE — Progress Notes (Addendum)
PULMONARY OFFICE FOLLOW-UP NOTE  Requesting MD/Service: Emily Filbert, MD Date of initial consultation: 11/12/17 Reason for consultation: L sided CP, new ground glass opacities on CT  PT PROFILE: 72 y.o. male former (remote) smoker with vague ground glass opacities noted on CT of T spine (after recent normal CT chest) which was performed to evaluate chronic L sided thoracic pain  DATA: 10/12/13 Spirometry: Mild obstruction with FEV1 77% predicted 12/12/14 PFTs Jefm Bryant): Spirometry not available, lung volumes normal, diffusion capacity normal 11/06/16 echocardiogram: LVEF 35-40%.  Mild LVH.  Basal-mid lateral and inferolateral hypokinesis 10/23/17 CTA chest: 4.2 cm ascending TAA. No pleural, chest wall or pulmonary findings to explain chest pain 11/06/17 CT T-spine: No spinal stenosis or neural impingement suspected. New multifocal bilateral pulmonary ground-glass opacity is new since the chest CT on 10/23/2017 and appears inflammatory 12/17/17: Mild obstruction, lung volumes normal, DLCO 51% predicted, DLCO/VA 69% predicted 12/23/17 CTA chest: Stable ascending aortic aneurysm. No evidence of pulmonary emboli. Resolution of previously see ground glass opacities   SUBJ:  This is a routine re-eval. First seen 03/28 with left-sided chest pain which I felt was either pleuritic or chest wall pain.  He was also noted to have vague groundglass opacities in the left upper lobe which were noted not on a CT scan of the chest but rather on CT scan of the thoracic spine.  He had no respiratory symptoms that could be attributed to these opacities and I did not feel that they were directly related to his chest discomfort.  He also reported unexplained weight loss.  I noted that he was on amiodarone which I felt might explain the groundglass opacities but did not likely explain his chest discomfort.  He returns today for routine reevaluation.  For the most part, since initial evaluation, nothing has changed.   However, in the past few days the chest pain has become "awful".  It is the same location and quality as previously (left anterior inframammary region) and now is made worse by deep inspiration.  Sometimes the pain is so severe that it makes him lightheaded and causes dry heaves.  He did use a Lidoderm patch as I previously prescribed which he felt was beneficial.  He denies fevers, cough, sputum production, hemoptysis, dyspnea, calf tenderness, lower extremity edema.   OBJ: Vitals:   12/23/17 0829 12/23/17 0832  BP:  118/70  Pulse:  (!) 56  SpO2:  97%  Weight: 186 lb (84.4 kg)   Height: _0  (1.753 m)   Room air   EXAM:  Gen: NAD HEENT: NCAT, sclera white Neck: No JVD Lungs: He appears to be in discomfort with deep inspiration.  Breath sounds full without wheezes or other adventitious sounds.  There is no pleural friction rub Cardiovascular: RRR, no murmurs Abdomen: Soft, nontender, normal BS Ext: without clubbing, cyanosis, edema Neuro: grossly intact Skin: Maculopapular rash back   DATA:   BMP Latest Ref Rng & Units 08/02/2015 07/17/2014 04/12/2014  Glucose 70 - 99 mg/dL - 103(H) 82  BUN 6 - 23 mg/dL - 15 20  Creatinine 0.61 - 1.24 mg/dL 1.30(H) 1.62(H) 1.41(H)  Sodium 135 - 145 mEq/L - 139 142  Potassium 3.5 - 5.3 mEq/L - 4.1 4.2  Chloride 96 - 112 mEq/L - 99 104  CO2 19 - 32 mEq/L - 30 29  Calcium 8.4 - 10.5 mg/dL - 9.2 8.8    CBC Latest Ref Rng & Units 03/09/2014 10/09/2008  WBC 3.8 - 10.6 x10 3/mm 3 8.7 8.5  Hemoglobin 13.0 - 18.0 g/dL 14.1 13.6  Hematocrit 40.0 - 52.0 % 41.8 38.3(L)  Platelets 150 - 440 x10 3/mm 3 189 235   ESR: 18 mm/hr ANA: Negative RF: 11.6 (0-13.9)  CXR: No new film  I have personally reviewed all chest radiographs reported above including CXRs and CT chest unless otherwise indicated  IMPRESSION:   1) COPD, mild  2) pleuritic or chest wall pain - unclear etiology despite multiple imaging studies.  I favor chest wall pain over  pleurodynia.  Pain seems to be unrelenting and perhaps worsening.  The only abnormality of any significance I have uncovered is a moderately elevated sed rate.  I have reviewed the literature and there are case reports of amiodarone related pleural disease.  However, this seems to be very uncommon. 3) Former smoker 4) Unexplained weight loss - worrisome in light of his other symptoms 5) evanescent rash - maculopapular rash noted on exam today.  He reports that this rash comes and goes   PLAN:  1) blood tests ordered today: D-dimer, BMP 2) CT angiogram of chest ordered today to rule out pulmonary embolism and reimage pleura and chest wall -this revealed no pulmonary embolism and no findings to explain his chest pain 3) I will discuss with Dr. Stanford Breed whether we can consider discontinuation of amiodarone 4) continue current management of pain with acetaminophen and Lidoderm patch 5) referral to pain management service.  There is some possibility that this is radicular pain and he could potentially benefit from a nerve block 6) follow-up in 4-6 weeks   Merton Border, MD PCCM service Mobile [phone] Pager [phone] 12/23/2017 9:10 AM   ADD: After review of CT chest, I placed order for prednisone 40 mg daily for 5 days  Merton Border, MD PCCM service Mobile [phone] Pager [phone] 12/24/2017 11:11 AM

## 2017-12-24 ENCOUNTER — Telehealth: Payer: Self-pay | Admitting: *Deleted

## 2017-12-24 DIAGNOSIS — R0781 Pleurodynia: Secondary | ICD-10-CM

## 2017-12-24 NOTE — Telephone Encounter (Signed)
Pt informed pain ref is being made. He started Prednisone yesterday. Nothing further needed.

## 2017-12-29 ENCOUNTER — Encounter: Payer: Self-pay | Admitting: Pulmonary Disease

## 2018-01-05 ENCOUNTER — Encounter: Payer: Self-pay | Admitting: Nurse Practitioner

## 2018-01-05 ENCOUNTER — Other Ambulatory Visit: Payer: Self-pay

## 2018-01-05 ENCOUNTER — Ambulatory Visit: Payer: Medicare Other | Attending: Nurse Practitioner | Admitting: Nurse Practitioner

## 2018-01-05 VITALS — BP 136/74 | HR 64 | Temp 98.0°F | Resp 16 | Ht 68.0 in | Wt 186.0 lb

## 2018-01-05 DIAGNOSIS — R0789 Other chest pain: Secondary | ICD-10-CM

## 2018-01-05 DIAGNOSIS — G894 Chronic pain syndrome: Secondary | ICD-10-CM | POA: Diagnosis not present

## 2018-01-05 DIAGNOSIS — R1011 Right upper quadrant pain: Secondary | ICD-10-CM | POA: Insufficient documentation

## 2018-01-05 DIAGNOSIS — Z9889 Other specified postprocedural states: Secondary | ICD-10-CM | POA: Insufficient documentation

## 2018-01-05 DIAGNOSIS — Z5181 Encounter for therapeutic drug level monitoring: Secondary | ICD-10-CM | POA: Insufficient documentation

## 2018-01-05 DIAGNOSIS — Z87891 Personal history of nicotine dependence: Secondary | ICD-10-CM | POA: Insufficient documentation

## 2018-01-05 DIAGNOSIS — Z79899 Other long term (current) drug therapy: Secondary | ICD-10-CM | POA: Diagnosis not present

## 2018-01-05 DIAGNOSIS — R079 Chest pain, unspecified: Secondary | ICD-10-CM | POA: Insufficient documentation

## 2018-01-05 DIAGNOSIS — R198 Other specified symptoms and signs involving the digestive system and abdomen: Secondary | ICD-10-CM | POA: Insufficient documentation

## 2018-01-05 DIAGNOSIS — I5022 Chronic systolic (congestive) heart failure: Secondary | ICD-10-CM | POA: Diagnosis not present

## 2018-01-05 DIAGNOSIS — M546 Pain in thoracic spine: Secondary | ICD-10-CM | POA: Insufficient documentation

## 2018-01-05 DIAGNOSIS — M549 Dorsalgia, unspecified: Secondary | ICD-10-CM | POA: Diagnosis not present

## 2018-01-05 DIAGNOSIS — I130 Hypertensive heart and chronic kidney disease with heart failure and stage 1 through stage 4 chronic kidney disease, or unspecified chronic kidney disease: Secondary | ICD-10-CM | POA: Insufficient documentation

## 2018-01-05 DIAGNOSIS — K219 Gastro-esophageal reflux disease without esophagitis: Secondary | ICD-10-CM | POA: Insufficient documentation

## 2018-01-05 DIAGNOSIS — M899 Disorder of bone, unspecified: Secondary | ICD-10-CM

## 2018-01-05 DIAGNOSIS — K8020 Calculus of gallbladder without cholecystitis without obstruction: Secondary | ICD-10-CM | POA: Insufficient documentation

## 2018-01-05 DIAGNOSIS — Z789 Other specified health status: Secondary | ICD-10-CM | POA: Diagnosis not present

## 2018-01-05 DIAGNOSIS — G8929 Other chronic pain: Secondary | ICD-10-CM

## 2018-01-05 DIAGNOSIS — N183 Chronic kidney disease, stage 3 (moderate): Secondary | ICD-10-CM | POA: Insufficient documentation

## 2018-01-05 NOTE — Progress Notes (Signed)
Safety precautions to be maintained throughout the outpatient stay will include: orient to surroundings, keep bed in low position, maintain call bell within reach at all times, provide assistance with transfer out of bed and ambulation.  

## 2018-01-05 NOTE — Patient Instructions (Addendum)
____________________________________________________________________________________________  Appointment Policy Summary  It is our goal and responsibility to provide the medical community with assistance in the evaluation and management of patients with chronic pain. Unfortunately our resources are limited. Because we do not have an unlimited amount of time, or available appointments, we are required to closely monitor and manage their use. The following rules exist to maximize their use:  Patient's responsibilities: 1. Punctuality:  At what time should I arrive? You should be physically present in our office 30 minutes before your scheduled appointment. Your scheduled appointment is with your assigned healthcare provider. However, it takes 5-10 minutes to be "checked-in", and another 15 minutes for the nurses to do the admission. If you arrive to our office at the time you were given for your appointment, you will end up being at least 20-25 minutes late to your appointment with the provider. 2. Tardiness:  What happens if I arrive only a few minutes after my scheduled appointment time? You will need to reschedule your appointment. The cutoff is your appointment time. This is why it is so important that you arrive at least 30 minutes before that appointment. If you have an appointment scheduled for 10:00 AM and you arrive at 10:01, you will be required to reschedule your appointment.  3. Plan ahead:  Always assume that you will encounter traffic on your way in. Plan for it. If you are dependent on a driver, make sure they understand these rules and the need to arrive early. 4. Other appointments and responsibilities:  Avoid scheduling any other appointments before or after your pain clinic appointments.  5. Be prepared:  Write down everything that you need to discuss with your healthcare provider and give this information to the admitting nurse. Write down the medications that you will need  refilled. Bring your pills and bottles (even the empty ones), to all of your appointments, except for those where a procedure is scheduled. 6. No children or pets:  Find someone to take care of them. It is not appropriate to bring them in. 7. Scheduling changes:  We request "advanced notification" of any changes or cancellations. 8. Advanced notification:  Defined as a time period of more than 24 hours prior to the originally scheduled appointment. This allows for the appointment to be offered to other patients. 9. Rescheduling:  When a visit is rescheduled, it will require the cancellation of the original appointment. For this reason they both fall within the category of "Cancellations".  10. Cancellations:  They require advanced notification. Any cancellation less than 24 hours before the  appointment will be recorded as a "No Show". 11. No Show:  Defined as an unkept appointment where the patient failed to notify or declare to the practice their intention or inability to keep the appointment.  Corrective process for repeat offenders:  1. Tardiness: Three (3) episodes of rescheduling due to late arrivals will be recorded as one (1) "No Show". 2. Cancellation or reschedule: Three (3) cancellations or rescheduling will be recorded as one (1) "No Show". 3. "No Shows": Three (3) "No Shows" within a 12 month period will result in discharge from the practice. ____________________________________________________________________________________________  ____________________________________________________________________________________________  Pain Scale  Introduction: The pain score used by this practice is the Verbal Numerical Rating Scale (VNRS-11). This is an 11-point scale. It is for adults and children 10 years or older. There are significant differences in how the pain score is reported, used, and applied. Forget everything you learned in the past and learn  this scoring system.  General  Information: The scale should reflect your current level of pain. Unless you are specifically asked for the level of your worst pain, or your average pain. If you are asked for one of these two, then it should be understood that it is over the past 24 hours.  Basic Activities of Daily Living (ADL): Personal hygiene, dressing, eating, transferring, and using restroom.  Instructions: Most patients tend to report their level of pain as a combination of two factors, their physical pain and their psychosocial pain. This last one is also known as "suffering" and it is reflection of how physical pain affects you socially and psychologically. From now on, report them separately. From this point on, when asked to report your pain level, report only your physical pain. Use the following table for reference.  Pain Clinic Pain Levels (0-5/10)  Pain Level Score  Description  No Pain 0   Mild pain 1 Nagging, annoying, but does not interfere with basic activities of daily living (ADL). Patients are able to eat, bathe, get dressed, toileting (being able to get on and off the toilet and perform personal hygiene functions), transfer (move in and out of bed or a chair without assistance), and maintain continence (able to control bladder and bowel functions). Blood pressure and heart rate are unaffected. A normal heart rate for a healthy adult ranges from 60 to 100 bpm (beats per minute).   Mild to moderate pain 2 Noticeable and distracting. Impossible to hide from other people. More frequent flare-ups. Still possible to adapt and function close to normal. It can be very annoying and may have occasional stronger flare-ups. With discipline, patients may get used to it and adapt.   Moderate pain 3 Interferes significantly with activities of daily living (ADL). It becomes difficult to feed, bathe, get dressed, get on and off the toilet or to perform personal hygiene functions. Difficult to get in and out of bed or a chair  without assistance. Very distracting. With effort, it can be ignored when deeply involved in activities.   Moderately severe pain 4 Impossible to ignore for more than a few minutes. With effort, patients may still be able to manage work or participate in some social activities. Very difficult to concentrate. Signs of autonomic nervous system discharge are evident: dilated pupils (mydriasis); mild sweating (diaphoresis); sleep interference. Heart rate becomes elevated (>115 bpm). Diastolic blood pressure (lower number) rises above 100 mmHg. Patients find relief in laying down and not moving.   Severe pain 5 Intense and extremely unpleasant. Associated with frowning face and frequent crying. Pain overwhelms the senses.  Ability to do any activity or maintain social relationships becomes significantly limited. Conversation becomes difficult. Pacing back and forth is common, as getting into a comfortable position is nearly impossible. Pain wakes you up from deep sleep. Physical signs will be obvious: pupillary dilation; increased sweating; goosebumps; brisk reflexes; cold, clammy hands and feet; nausea, vomiting or dry heaves; loss of appetite; significant sleep disturbance with inability to fall asleep or to remain asleep. When persistent, significant weight loss is observed due to the complete loss of appetite and sleep deprivation.  Blood pressure and heart rate becomes significantly elevated. Caution: If elevated blood pressure triggers a pounding headache associated with blurred vision, then the patient should immediately seek attention at an urgent or emergency care unit, as these may be signs of an impending stroke.    Emergency Department Pain Levels (6-10/10)  Emergency Room Pain 6 Severely   limiting. Requires emergency care and should not be seen or managed at an outpatient pain management facility. Communication becomes difficult and requires great effort. Assistance to reach the emergency department  may be required. Facial flushing and profuse sweating along with potentially dangerous increases in heart rate and blood pressure will be evident.   Distressing pain 7 Self-care is very difficult. Assistance is required to transport, or use restroom. Assistance to reach the emergency department will be required. Tasks requiring coordination, such as bathing and getting dressed become very difficult.   Disabling pain 8 Self-care is no longer possible. At this level, pain is disabling. The individual is unable to do even the most "basic" activities such as walking, eating, bathing, dressing, transferring to a bed, or toileting. Fine motor skills are lost. It is difficult to think clearly.   Incapacitating pain 9 Pain becomes incapacitating. Thought processing is no longer possible. Difficult to remember your own name. Control of movement and coordination are lost.   The worst pain imaginable 10 At this level, most patients pass out from pain. When this level is reached, collapse of the autonomic nervous system occurs, leading to a sudden drop in blood pressure and heart rate. This in turn results in a temporary and dramatic drop in blood flow to the brain, leading to a loss of consciousness. Fainting is one of the body's self defense mechanisms. Passing out puts the brain in a calmed state and causes it to shut down for a while, in order to begin the healing process.    Summary: 1. Refer to this scale when providing [place] with your pain level. 2. Be accurate and careful when reporting your pain level. This will help with your care. 3. Over-reporting your pain level will lead to loss of credibility. 4. Even a level of 1/10 means that there is pain and will be treated at our facility. 5. High, inaccurate reporting will be documented as "Symptom Exaggeration", leading to loss of credibility and suspicions of possible secondary gains such as obtaining more narcotics, or wanting to appear disabled, for  fraudulent reasons. 6. Only pain levels of 5 or below will be seen at our facility. 7. Pain levels of 6 and above will be sent to the Emergency Department and the appointment cancelled. ____________________________________________________________________________________________   BMI Assessment: Estimated body mass index is 28.28 kg/m as calculated from the following:   Height as of this encounter: 5\' 8"  (1.727 m).   Weight as of this encounter: 186 lb (84.4 kg).  BMI interpretation table: BMI level Category Range association with higher incidence of chronic pain  <18 kg/m2 Underweight   18.5-24.9 kg/m2 Ideal body weight   25-29.9 kg/m2 Overweight Increased incidence by 20%  30-34.9 kg/m2 Obese (Class I) Increased incidence by 68%  35-39.9 kg/m2 Severe obesity (Class II) Increased incidence by 136%  >40 kg/m2 Extreme obesity (Class III) Increased incidence by 254%   BMI Readings from Last 4 Encounters:  01/05/18 28.28 kg/m  12/23/17 27.47 kg/m  11/23/17 28.03 kg/m  11/12/17 29.19 kg/m   Wt Readings from Last 4 Encounters:  01/05/18 186 lb (84.4 kg)  12/23/17 186 lb (84.4 kg)  11/23/17 189 lb 12.8 oz (86.1 kg)  11/12/17 192 lb (87.1 kg)

## 2018-01-05 NOTE — Progress Notes (Addendum)
Patient's Name: [name]  MRN: [id number]  Referring Provider: Wilhelmina Mcardle, MD  DOB: July 24, 1946  PCP: Rusty Aus, MD  DOS: 01/05/2018  Note by: Dionisio David NP  Service setting: Ambulatory outpatient  Specialty: Interventional Pain Management  Location: [facility] (AMB) Pain Management Facility    Patient type: New Patient    Primary Reason(s) for Visit: Initial Patient Evaluation CC: Pain (chest pain)  HPI  [name] is a 72 y.o. year old, male patient, who comes today for an initial evaluation. He has Combined hyperlipidemia; RHEUMATIC FEVER; Benign essential hypertension; Cardiomyopathy, nonischemic ([place]); Asymptomatic PVCs; LUNG NODULE; OTHER OSTEOPOROSIS; DIZZINESS; Nonspecific (abnormal) findings on radiological and other examination of body structure; IMPLANTATION OF DEFIBRILLATOR, HX OF; Nonspecific abnormal findings on radiological and other examination of skull and head; Chronic systolic heart failure ([place]); Automatic implantable cardioverter-defibrillator in situ; Bruit; Abdominal pain; Asthma; B12 deficiency; CKD (chronic kidney disease) stage 3, GFR 30-59 ml/min ([place]); Medicare annual wellness visit, initial; Positive Murphy's Sign; Chronic pain syndrome; Pharmacologic therapy; Disorder of skeletal system; Problems influencing health status; Chronic chest wall pain (Primary Area of Pain)(L); and Chronic left-sided thoracic back pain (Secondary Area of Pain) on their problem list.. His primarily concern today is the Pain (chest pain)  Pain Assessment: Location: Left Chest Radiating: sometimes will radiate around to back Onset: More than a month ago Duration: Chronic pain Quality: Discomfort, Aching, Pressure Severity: 3 /10 (subjective, self-reported pain score)  Note: Reported level is compatible with observation.                          Effect on ADL: sleep,deep breathing, couging Timing: Constant Modifying factors: Will go away after a few hours, lidocaine  patches BP: 136/74  HR: 64  Onset and Duration: Date of onset: 2years Cause of pain: Unknown Severity: Getting worse, NAS-11 at its worse: 10/10, NAS-11 at its best: 2/10, NAS-11 now: 4/10 and NAS-11 on the average: 4/10 Timing: Not influenced by the time of the day Aggravating Factors: nothing documented Alleviating Factors: not documented Associated Problems: Pain that wakes patient up and Pain that does not allow patient to sleep Quality of Pain: Intermittent and Exhausting Previous Examinations or Tests: CT scan and X-rays Previous Treatments: Steroid treatments by mouth and Trigger point injections  The patient comes into the clinics today for the first time for a chronic pain management evaluation. According to the patient his primary area of pain is in his left chest. He admits that he has had this pain for approximately 2 years it has gotten worse in the last 6-7 months. He admits that he fell over a chair broke some ribs however he was having this pain prior to this fall. He admits that the ribs have healed. He admits that the pain is worse at night when he is lying down. He admits that it affects his breathing. He admits that getting up and walking helps the pain for a while. He admits that the pain does radiate around to his back He admits that he does have a cardiac history and he is status post defibrillator in 2008. He states that after having not used the defibrillator at the time of battery change the defibrillator was removed. He admits that the leads are still in place. He has had CT scan of chest, abdomen and thoracic spine. He admits that he is an ex-smoker and the most recent chest CT did show some slight abnormality. He admits  that he has lost approximately 60 pounds in the last 2 years. He admits that he has had trigger point injections. He admits the first one lasted approximately 2 months however overall not effective. He admits that he has failed gabapentin has never tried  Lyrica. He admits that recently he was on prednisone this did make him feel better and he was able to eat more.   His area to pain is in his middle back. He denies any previous surgeries. He did have trigger point injections by Dr. Sabra Heck which were effective for approximately 2 months. He denies any physical therapy. He has had CT of the thoracic spine.  Today I took the time to provide the patient with information regarding this pain practice. The patient was informed that the practice is divided into two sections: an interventional pain management section, as well as a completely separate and distinct medication management section. I explained that there are procedure days for interventional therapies, and evaluation days for follow-ups and medication management. Because of the amount of documentation required during both, they are kept separated. This means that there is the possibility that hemay be scheduled for a procedure on one day, and medication management the next. I have also informed him that because of staffing and facility limitations, this practice will no longer take patients for medication management only. To illustrate the reasons for this, I gave the patient the example of surgeons, and how inappropriate it would be to refer a patient to his/her care, just to write for the post-surgical antibiotics on a surgery done by a different surgeon.   Because interventional pain management is part of the board-certified specialty for the doctors, the patient was informed that joining this practice means that they are open to any and all interventional therapies. I made it clear that this does not mean that they will be forced to have any procedures done. What this means is that I believe interventional therapies to be essential part of the diagnosis and proper management of chronic pain conditions. Therefore, patients not interested in these interventional alternatives will be better served under  the care of a different practitioner.  The patient was also made aware of my Comprehensive Pain Management Safety Guidelines where by joining this practice, they limit all of their nerve blocks and joint injections to those done by our practice, for as long as we are retained to manage their care. Historic Controlled Substance Pharmacotherapy Review  PMP and historical list of controlled substances: tramadol 50 mg, Lorcet 5/325 mg, oxycodone/acetaminophen 5/325 mg, alprazolam 0.5 mg Highest opioid analgesic regimen found: oxycodone/acetaminophen 5/325 mg 1 tablet every 4 hours when necessary (last fill date 08/08/2018) oxycodone 10 mg per day Most recent opioid analgesic: tramadol 50 mg 1 tablet 4 times daily (fill date 10/28/2017)she tramadol 200 mg per day Current opioid analgesics: none Highest recorded MME/day: 50 mg/day MME/day:0 mg/day Medications: The patient did not bring the medication(s) to the appointment, as requested in our "New Patient Package" Pharmacodynamics: Desired effects: Analgesia: The patient reports >50% benefit. Reported improvement in function: The patient reports medication allows him to accomplish basic ADLs. Clinically meaningful improvement in function (CMIF): Sustained CMIF goals met Perceived effectiveness: Described as relatively effective, allowing for increase in activities of daily living (ADL) Undesirable effects: Side-effects or Adverse reactions: None reported Historical Monitoring: The patient  reports that he does not use drugs. List of all UDS Test(s): No results found for: MDMA, COCAINSCRNUR, [place], [place], [place], [place], [place] List of  all Serum Drug Screening Test(s):  No results found for: AMPHSCRSER, BARBSCRSER, BENZOSCRSER, COCAINSCRSER, PCPSCRSER, PCPQUANT, THCSCRSER, CANNABQUANT, OPIATESCRSER, OXYSCRSER, PROPOXSCRSER Historical Background Evaluation: [place] PDMP: Six (6) year initial data search conducted.             [place] Department of  public safety, offender search: Editor, commissioning Information) Non-contributory Risk Assessment Profile: Aberrant behavior: None observed or detected today Risk factors for fatal opioid overdose: None identified today Fatal overdose hazard ratio (HR): Calculation deferred Non-fatal overdose hazard ratio (HR): Calculation deferred Risk of opioid abuse or dependence: 0.7-3.0% with doses ? 36 MME/day and 6.1-26% with doses ? 120 MME/day. Substance use disorder (SUD) risk level: Pending results of Medical Psychology Evaluation for SUD Opioid risk tool (ORT) (Total Score): 0  ORT Scoring interpretation table:  Score <3 = Low Risk for SUD  Score between 4-7 = Moderate Risk for SUD  Score >8 = High Risk for Opioid Abuse   PHQ-2 Depression Scale:  Total score: 0  PHQ-2 Scoring interpretation table: (Score and probability of major depressive disorder)  Score 0 = No depression  Score 1 = 15.4% Probability  Score 2 = 21.1% Probability  Score 3 = 38.4% Probability  Score 4 = 45.5% Probability  Score 5 = 56.4% Probability  Score 6 = 78.6% Probability   PHQ-9 Depression Scale:  Total score: 0  PHQ-9 Scoring interpretation table:  Score 0-4 = No depression  Score 5-9 = Mild depression  Score 10-14 = Moderate depression  Score 15-19 = Moderately severe depression  Score 20-27 = Severe depression (2.4 times higher risk of SUD and 2.89 times higher risk of overuse)   Pharmacologic Plan: Pending ordered tests and/or consults  Meds  The patient has a current medication list which includes the following prescription(s): amiodarone, atorvastatin, carvedilol, cyanocobalamin, furosemide, hydralazine, lido-capsaicin-men-methyl sal, oxygen-helium, pantoprazole, potassium chloride, and isosorbide mononitrate.  Current Outpatient Medications on File Prior to Visit  Medication Sig  . amiodarone (PACERONE) 200 MG tablet TAKE 1 TABLET (200 MG TOTAL) BY MOUTH DAILY.  Marland Kitchen atorvastatin (LIPITOR) 40 MG tablet TAKE 0.5  TABLETS (20 MG TOTAL) BY MOUTH DAILY.  . carvedilol (COREG) 25 MG tablet TAKE 2 TABLETS (50 MG TOTAL) BY MOUTH 2 (TWO) TIMES DAILY WITH A MEAL.  Marland Kitchen Cyanocobalamin (VITAMIN B12 SL) Place 1 tablet under the tongue daily.  . furosemide (LASIX) 20 MG tablet TAKE 2 TABLETS (40 MG TOTAL) BY MOUTH DAILY. TAKE AN EXTRA 20MG DAILY AS NEEDED  . hydrALAZINE (APRESOLINE) 50 MG tablet TAKE 1 & 1/2 TABLET BY MOUTH 3 TIMES DAILY  . Lido-Capsaicin-Men-Methyl Sal (1ST MEDX-PATCH/ LIDOCAINE) 4-0.025-5-20 % PTCH Apply 1 patch topically at bedtime.  . OXYGEN Inhale 2L for 8 hours at night.  . pantoprazole (PROTONIX) 40 MG tablet Take 40 mg by mouth daily.   . potassium chloride (K-DUR) 10 MEQ tablet Take 10 mEq by mouth daily.  . isosorbide mononitrate (IMDUR) 30 MG 24 hr tablet Take 1 tablet (30 mg total) by mouth daily.   No current facility-administered medications on file prior to visit.    Imaging Review  Shoulder Imaging:  Shoulder-L DG:  Results for orders placed during the hospital encounter of 08/09/15  DG Shoulder Left   Narrative CLINICAL DATA:  Left shoulder pain after fall down stairs. Initial encounter.  EXAM: LEFT SHOULDER - 2+ VIEW  COMPARISON:  None.  FINDINGS: Soft tissue swelling over the [facility] joint without widening or offset.  Located glenohumeral joint.  No evidence of acute fracture.  AC joint osteoarthritis with moderate spurring.  IMPRESSION: Soft tissue swelling without fracture or dislocation.   Electronically Signed   By: Monte Fantasia M.D.   On: 08/09/2015 01:38     Thoracic Imaging:  Thoracic CT wo contrast:  Results for orders placed during the hospital encounter of 11/06/17  CT THORACIC SPINE WO CONTRAST   Narrative CLINICAL DATA:  72 year old male with intermittent left chest pain radiating to the mid back with progressive symptoms for 4-6 months. No known injury.  EXAM: CT THORACIC SPINE WITHOUT CONTRAST  TECHNIQUE: Multidetector CT images of the  thoracic were obtained using the standard protocol without intravenous contrast.  COMPARISON:  Chest abdomen and pelvis CT 10/23/2017, and earlier.  FINDINGS: Limited cervical spine imaging:  Normal for age.  Thoracic spine segmentation: Normal. Incidental incompletely fused ossification center of the left L1 transverse process.  Alignment: Stable and normal thoracic kyphosis. No spondylolisthesis.  Vertebrae: Osteopenia. No acute or suspicious osseous lesion in the thoracic spine. The visible posterior ribs appear intact. Chronic interbody ankylosis at T5-T6, T6-T7, and T9-T10 related to bulky right anterolateral endplate osteophytes.  Paraspinal and other soft tissues: Left chest cardiac pacemaker device. Calcified aortic atherosclerosis. Stable thoracic and visible upper abdominal aorta. Calcified coronary artery atherosclerosis. No pericardial or pleural effusion. Negative visible upper abdominal viscera. The major airways are patent.  There are new multifocal bilateral areas of peripheral and peribronchial pulmonary ground-glass opacity in the bilateral visible upper lobes. The right middle lobe also appears affected (series 7, image 110). There is more nodular and solid-appearing involvement of the bilateral lower lobes in the costophrenic angles. These pulmonary findings are new since 10/23/2017.  Disc levels:  Capacious thoracic spinal canal. No CT evidence of thoracic spinal stenosis. Notable degeneration as follows:  T3-T4: Moderate facet and ligament flavum hypertrophy on the left. No stenosis.  T4-T5: Moderate to severe bilateral facet and ligament flavum hypertrophy. Right side vacuum facet. No stenosis.  T8-T9: Circumferential disc bulge. Bulky left costovertebral degenerative spurring. No stenosis.  T10-T11: Vacuum disc.  No stenosis.  T11-T12: Vacuum disc.  No stenosis.  IMPRESSION: 1. No acute osseous abnormality in the thoracic spine and  fairly mild for age thoracic spine degeneration. Capacious thoracic spinal canal. No spinal stenosis or neural impingement suspected. 2. New multifocal bilateral pulmonary ground-glass opacity is new since the chest CT on 10/23/2017 and appears inflammatory. No associated pleural effusion. Consider viral/atypical respiratory infection with differential considerations including mycoplasma, adenovirus, influenza, etc. 3. Stable visible aorta, aortic and coronary artery calcified atherosclerosis.   Electronically Signed   By: Genevie Ann M.D.   On: 11/06/2017 15:26     Note: Available results from prior imaging studies were reviewed.        ROS  Cardiovascular History: Heart trouble, Abnormal heart rhythm, High blood pressure, Heart failure and Weak heart (CHF) Pulmonary or Respiratory History: No reported pulmonary signs or symptoms such as wheezing and difficulty taking a deep full breath (Asthma), difficulty blowing air out (Emphysema), coughing up mucus (Bronchitis), persistent dry cough, or temporary stoppage of breathing during sleep Neurological History: No reported neurological signs or symptoms such as seizures, abnormal skin sensations, urinary and/or fecal incontinence, being born with an abnormal open spine and/or a tethered spinal cord Review of Past Neurological Studies: No results found for this or any previous visit. Psychological-Psychiatric History: No reported psychological or psychiatric signs or symptoms such as difficulty sleeping, anxiety, depression, delusions or hallucinations (schizophrenial), mood swings (bipolar disorders) or suicidal ideations  or attempts Gastrointestinal History: No reported gastrointestinal signs or symptoms such as vomiting or evacuating blood, reflux, heartburn, alternating episodes of diarrhea and constipation, inflamed or scarred liver, or pancreas or irrregular and/or infrequent bowel movements Genitourinary History: Kidney  disease Hematological History: No reported hematological signs or symptoms such as prolonged bleeding, low or poor functioning platelets, bruising or bleeding easily, hereditary bleeding problems, low energy levels due to low hemoglobin or being anemic Endocrine History: No reported endocrine signs or symptoms such as high or low blood sugar, rapid heart rate due to high thyroid levels, obesity or weight gain due to slow thyroid or thyroid disease Rheumatologic History: Joint aches and or swelling due to excess weight (Osteoarthritis) Musculoskeletal History: Negative for myasthenia gravis, muscular dystrophy, multiple sclerosis or malignant hyperthermia Work History: Retired  Allergies  Mr. Phaneuf is allergic to ace inhibitors; aspirin; indocin [indomethacin]; mobic [meloxicam]; and nsaids.  Laboratory Chemistry  Inflammation Markers Lab Results  Component Value Date   CRP 8.8 (H) 01/05/2018   ESRSEDRATE 4 01/05/2018   (CRP: Acute Phase) (ESR: Chronic Phase) Renal Function Markers Lab Results  Component Value Date   BUN 11 01/05/2018   CREATININE 1.17 01/05/2018   GFRAA 73 01/05/2018   GFRNONAA 63 01/05/2018   Hepatic Function Markers Lab Results  Component Value Date   AST 25 01/05/2018   ALT 18 04/12/2014   ALBUMIN 3.6 01/05/2018   ALKPHOS 47 01/05/2018   Electrolytes Lab Results  Component Value Date   NA 143 01/05/2018   K 4.2 01/05/2018   CL 105 01/05/2018   CALCIUM 8.6 01/05/2018   MG 2.2 01/05/2018   Neuropathy Markers Lab Results  Component Value Date   VITAMINB12 940 01/05/2018   Bone Pathology Markers Lab Results  Component Value Date   ALKPHOS 47 01/05/2018   25OHVITD1 WILL FOLLOW 01/05/2018   25OHVITD2 WILL FOLLOW 01/05/2018   25OHVITD3 WILL FOLLOW 01/05/2018   CALCIUM 8.6 01/05/2018   Coagulation Parameters Lab Results  Component Value Date   INR 0.90 10/08/2012   LABPROT 12.1 10/08/2012   PLT 189 03/09/2014   Cardiovascular Markers Lab  Results  Component Value Date   BNP 86.3 09/18/2014   HGB 14.1 03/09/2014   HCT 41.8 03/09/2014   Note: Lab results reviewed.  PFSH  Drug: Mr. Fontanez  reports that he does not use drugs. Alcohol:  reports that he drinks alcohol. Tobacco:  reports that he quit smoking about 47 years ago. His smoking use included cigarettes. He has a 10.00 pack-year smoking history. He has never used smokeless tobacco. Medical:  has a past medical history of Abnormal chest CT, Benign hypertension, Cardiomyopathy, CHF (congestive heart failure) ([place]), Chronic renal insufficiency, GERD (gastroesophageal reflux disease), Hives, Hyperlipidemia, NSVT (nonsustained ventricular tachycardia) ([place]), Osteoarthritis, Osteoarthrosis, unspecified whether generalized or localized, lower leg, Osteoporosis, Oxygen deficiency, Psoriasis, PVC (premature ventricular contraction), and Urticaria. Family: family history includes Cancer in his mother; Heart attack in his father; Lung cancer in his mother.  Past Surgical History:  Procedure Laterality Date  . BIV ICD GENERTAOR CHANGE OUT N/A 10/08/2012   Procedure: BIV ICD GENERTAOR CHANGE OUT;  Surgeon: Evans Lance, MD;  Location: [facility] CATH LAB;  Service: Cardiovascular;  Laterality: N/A;  . CARDIAC CATHETERIZATION    . CARDIAC DEFIBRILLATOR REMOVAL  2014  . CATARACT EXTRACTION    . CATARACT EXTRACTION W/PHACO Left 03/12/2016   Procedure: CATARACT EXTRACTION PHACO AND INTRAOCULAR LENS PLACEMENT (IOC);  Surgeon: Estill Cotta, MD;  Location: [facility] ORS;  Service:  Ophthalmology;  Laterality: Left;  [place] 01:32 AP% 25.7 CDE 41.33 fluid pack lot # [id number]  . Implantation of dual-chamber ICD.     St[employer] Jude DDD-ICD 1/07  . KNEE ARTHROPLASTY Left 2006  . KNEE ARTHROPLASTY Right 12/10   x2  . TONSILLECTOMY     Active Ambulatory Problems    Diagnosis Date Noted  . Combined hyperlipidemia 10/27/2008  . RHEUMATIC FEVER 10/27/2008  . Benign essential hypertension 02/15/2009  .  Cardiomyopathy, nonischemic ([place]) 02/15/2009  . Asymptomatic PVCs 10/27/2008  . LUNG NODULE 02/15/2009  . OTHER OSTEOPOROSIS 10/27/2008  . DIZZINESS 02/15/2009  . Nonspecific (abnormal) findings on radiological and other examination of body structure 02/15/2009  . IMPLANTATION OF DEFIBRILLATOR, HX OF 02/15/2009  . Nonspecific abnormal findings on radiological and other examination of skull and head 02/15/2009  . Chronic systolic heart failure ([place]) 09/09/2011  . 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 Bruit 08/29/2013  . Abdominal pain 04/20/2015  . Asthma 01/05/2014  . B12 deficiency 10/06/2016  . CKD (chronic kidney disease) stage 3, GFR 30-59 ml/min ([facility]) 09/18/2014  . Medicare annual wellness visit, initial 10/06/2016  . Positive Murphy's Sign 01/05/2018  . Chronic pain syndrome 01/05/2018  . Pharmacologic therapy 01/05/2018  . Disorder of skeletal system 01/05/2018  . Problems influencing health status 01/05/2018  . Chronic chest wall pain (Primary Area of Pain)(L) 01/06/2018  . Chronic left-sided thoracic back pain (Secondary Area of Pain) 01/06/2018   Resolved Ambulatory Problems    Diagnosis Date Noted  . No Resolved Ambulatory Problems   Past Medical History:  Diagnosis Date  . Abnormal chest CT   . Benign hypertension   . Cardiomyopathy   . CHF (congestive heart failure) ([place])   . Chronic renal insufficiency   . GERD (gastroesophageal reflux disease)   . Hives   . Hyperlipidemia   . NSVT (nonsustained ventricular tachycardia) ([place])   . Osteoarthritis   . Osteoarthrosis, unspecified whether generalized or localized, lower leg   . Osteoporosis   . Oxygen deficiency   . Psoriasis   . PVC (premature ventricular contraction)   . Urticaria    Constitutional Exam  General appearance: Well nourished, well developed, and well hydrated. In no apparent acute distress Vitals:   01/05/18 0808  BP: 136/74  Pulse: 64  Resp: 16  Temp: 98  F (36.7 C)  SpO2: 98%  Weight: 186 lb (84.4 kg)  Height: 5' 8" (1.727 m)  Psych/Mental status: Alert, oriented x 3 (person, place, & time)       Eyes: PERLA Respiratory: No evidence of acute respiratory distress SKIN: generalized blanchable macular rash over the entire trunk Cervical Spine Exam  Inspection: No masses, redness, or swelling Alignment: Symmetrical Functional ROM: Unrestricted ROM      Stability: No instability detected Muscle strength & Tone: Functionally intact Sensory: Unimpaired Palpation: No palpable anomalies              Upper Extremity (UE) Exam    Side: Right upper extremity  Side: Left upper extremity  Inspection:  bruising nonpitting edema  Inspection:  bruising  Functional ROM: Unrestricted ROM          Functional ROM: Unrestricted ROM          Muscle strength & Tone: Functionally intact  Muscle strength & Tone: Functionally intact  Sensory: Unimpaired  Sensory: Unimpaired  Palpation: No palpable anomalies              Palpation: No palpable anomalies  Specialized Test(s): Deferred         Specialized Test(s): Deferred          Thoracic Spine Exam  Inspection: No masses, redness, or swelling Alignment: Asymmetric Functional ROM: Unrestricted ROM Stability: No instability detected Sensory: Unimpaired Muscle strength & Tone: Complains of area being tender to palpation   CHEST/ABDOMEN: Pronounced ribs bilaterally Murphy's sign: positive with referred pain across the chest.  Xyphoid process: complaints of tenderness to palpation  Lumbar Spine Exam  Inspection: No masses, redness, or swelling Alignment: Symmetrical Functional ROM: Unrestricted ROM      Stability: No instability detected Muscle strength & Tone: Functionally intact Sensory: Unimpaired Palpation: Complains of area being tender to palpation       Provocative Tests: Lumbar Hyperextension and rotation test: evaluation deferred today       Patrick's Maneuver: evaluation  deferred today                    Gait & Posture Assessment  Ambulation: Unassisted Gait: Relatively normal for age and body habitus Posture: WNL   Lower Extremity Exam    Side: Right lower extremity  Side: Left lower extremity  Inspection: No masses, redness, swelling, or asymmetry. No contractures  Inspection: No masses, redness, swelling, or asymmetry. No contractures  Functional ROM: Unrestricted ROM          Functional ROM: Unrestricted ROM          Muscle strength & Tone: Functionally intact  Muscle strength & Tone: Functionally intact  Sensory: Unimpaired  Sensory: Unimpaired  Palpation: No palpable anomalies  Palpation: No palpable anomalies   Assessment  Primary Diagnosis & Pertinent Problem List: The primary encounter diagnosis was Chronic chest wall pain (Primary Area of Pain)(L). Diagnoses of Chronic left-sided thoracic back pain (Secondary Area of Pain), Abdominal pain, chronic, right upper quadrant, Chronic left-sided back pain, unspecified back location, Positive Murphy's Sign, Chronic pain syndrome, Pharmacologic therapy, Disorder of skeletal system, Calculus of gallbladder without cholecystitis without obstruction, and Problems influencing health status were also pertinent to this visit.  Visit Diagnosis: 1. Chronic chest wall pain (Primary Area of Pain)(L)   2. 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 Abdominal pain, chronic, right upper quadrant   4. Chronic left-sided back pain, unspecified back location   5. Positive Murphy's Sign   6. Chronic pain syndrome   7. Pharmacologic therapy   8. Disorder of skeletal system   9. Calculus of gallbladder without cholecystitis without obstruction   10. Problems influencing health status    Plan of Care  Initial treatment plan:  Please be advised that as per protocol, today's visit has been an evaluation only. We have not taken over the patient's controlled substance management.  Problem-specific  plan: No problem-specific Assessment & Plan notes found for this encounter.  Ordered Lab-work, Procedure(s), Referral(s), & Consult(s): Orders Placed This Encounter  Procedures  . NM Hepato W/EjeCT Fract  . DG Lumbar Spine Complete W/Bend  . Compliance Drug Analysis, Ur  . Comp. Metabolic Panel (12)  . Magnesium  . Vitamin B12  . Sedimentation rate  . 25-Hydroxyvitamin D Lcms D2+D3  . C-reactive protein  . Amylase  . Lipase   Pharmacotherapy: Medications ordered:  No orders of the defined types were placed in this encounter.  Medications administered during this visit: Diangelo Radel. Toro had no medications administered during this visit.   Pharmacotherapy under consideration:  Opioid Analgesics: The patient was informed that there  is no guarantee that he would be a candidate for opioid analgesics. The decision will be made following CDC guidelines. This decision will be based on the results of diagnostic studies, as well as Mr. Inghram's risk profile.  Membrane stabilizer: To be determined at a later time Muscle relaxant: To be determined at a later time NSAID: To be determined at a later time Other analgesic(s): To be determined at a later time   Interventional therapies under consideration: Mr. Cass was informed that there is no guarantee that he would be a candidate for interventional therapies. The decision will be based on the results of diagnostic studies, as well as Mr. Thorpe's risk profile.  Possible procedure(s): Diagnostic trigger point injection diagnostic left sided T ESI Diagnostic bilateral thoracic facet nerve block Possible bilateral thoracic facet RFA   Provider-requested follow-up: Return for 2nd Visit, w/ Dr. Dossie Arbour.  Future Appointments  Date Time Provider [place]  01/18/2018  8:30 AM Milinda Pointer, MD [facility] None  01/21/2018  8:30 AM Wilhelmina Mcardle, MD [facility] None    Primary Care Physician: Rusty Aus, MD Location:  [facility] Note by:  Date: 01/05/2018; Time: 6:29 PM  Pain Score Disclaimer: We use the NRS-11 scale. This is a self-reported, subjective measurement of pain severity with only modest accuracy. It is used primarily to identify changes within a particular patient. It must be understood that outpatient pain scales are significantly less accurate that those used for research, where they can be applied under ideal controlled circumstances with minimal exposure to variables. In reality, the score is likely to be a combination of pain intensity and pain affect, where pain affect describes the degree of emotional arousal or changes in action readiness caused by the sensory experience of pain. Factors such as social and work situation, setting, emotional state, anxiety levels, expectation, and prior pain experience may influence pain perception and show large inter-individual differences that may also be affected by time variables.  Patient instructions provided during this appointment: Patient Instructions   ____________________________________________________________________________________________  Appointment Policy Summary  It is our goal and responsibility to provide the medical community with assistance in the evaluation and management of patients with chronic pain. Unfortunately our resources are limited. Because we do not have an unlimited amount of time, or available appointments, we are required to closely monitor and manage their use. The following rules exist to maximize their use:  Patient's responsibilities: 1. Punctuality:  At what time should I arrive? You should be physically present in our office 30 minutes before your scheduled appointment. Your scheduled appointment is with your assigned healthcare provider. However, it takes 5-10 minutes to be "checked-in", and another 15 minutes for the nurses to do the admission. If you arrive to our office at the time  you were given for your appointment, you will end up being at least 20-25 minutes late to your appointment with the provider. 2. Tardiness:  What happens if I arrive only a few minutes after my scheduled appointment time? You will need to reschedule your appointment. The cutoff is your appointment time. This is why it is so important that you arrive at least 30 minutes before that appointment. If you have an appointment scheduled for 10:00 AM and you arrive at 10:01, you will be required to reschedule your appointment.  3. Plan ahead:  Always assume that you will encounter traffic on your way in. Plan for it. If you are dependent on a driver, make sure they  understand these rules and the need to arrive early. 4. Other appointments and responsibilities:  Avoid scheduling any other appointments before or after your pain clinic appointments.  5. Be prepared:  Write down everything that you need to discuss with your healthcare provider and give this information to the admitting nurse. Write down the medications that you will need refilled. Bring your pills and bottles (even the empty ones), to all of your appointments, except for those where a procedure is scheduled. 6. No children or pets:  Find someone to take care of them. It is not appropriate to bring them in. 7. Scheduling changes:  We request "advanced notification" of any changes or cancellations. 8. Advanced notification:  Defined as a time period of more than 24 hours prior to the originally scheduled appointment. This allows for the appointment to be offered to other patients. 9. Rescheduling:  When a visit is rescheduled, it will require the cancellation of the original appointment. For this reason they both fall within the category of "Cancellations".  10. Cancellations:  They require advanced notification. Any cancellation less than 24 hours before the  appointment will be recorded as a "No Show". 11. No Show:  Defined as an unkept  appointment where the patient failed to notify or declare to the practice their intention or inability to keep the appointment.  Corrective process for repeat offenders:  1. Tardiness: Three (3) episodes of rescheduling due to late arrivals will be recorded as one (1) "No Show". 2. Cancellation or reschedule: Three (3) cancellations or rescheduling will be recorded as one (1) "No Show". 3. "No Shows": Three (3) "No Shows" within a 12 month period will result in discharge from the practice. ____________________________________________________________________________________________  ____________________________________________________________________________________________  Pain Scale  Introduction: The pain score used by this practice is the Verbal Numerical Rating Scale (VNRS-11). This is an 11-point scale. It is for adults and children 10 years or older. There are significant differences in how the pain score is reported, used, and applied. Forget everything you learned in the past and learn this scoring system.  General Information: The scale should reflect your current level of pain. Unless you are specifically asked for the level of your worst pain, or your average pain. If you are asked for one of these two, then it should be understood that it is over the past 24 hours.  Basic Activities of Daily Living (ADL): Personal hygiene, dressing, eating, transferring, and using restroom.  Instructions: Most patients tend to report their level of pain as a combination of two factors, their physical pain and their psychosocial pain. This last one is also known as "suffering" and it is reflection of how physical pain affects you socially and psychologically. From now on, report them separately. From this point on, when asked to report your pain level, report only your physical pain. Use the following table for reference.  Pain Clinic Pain Levels (0-5/10)  Pain Level Score  Description  No Pain 0    Mild pain 1 Nagging, annoying, but does not interfere with basic activities of daily living (ADL). Patients are able to eat, bathe, get dressed, toileting (being able to get on and off the toilet and perform personal hygiene functions), transfer (move in and out of bed or a chair without assistance), and maintain continence (able to control bladder and bowel functions). Blood pressure and heart rate are unaffected. A normal heart rate for a healthy adult ranges from 60 to 100 bpm (beats per minute).   Mild  to moderate pain 2 Noticeable and distracting. Impossible to hide from other people. More frequent flare-ups. Still possible to adapt and function close to normal. It can be very annoying and may have occasional stronger flare-ups. With discipline, patients may get used to it and adapt.   Moderate pain 3 Interferes significantly with activities of daily living (ADL). It becomes difficult to feed, bathe, get dressed, get on and off the toilet or to perform personal hygiene functions. Difficult to get in and out of bed or a chair without assistance. Very distracting. With effort, it can be ignored when deeply involved in activities.   Moderately severe pain 4 Impossible to ignore for more than a few minutes. With effort, patients may still be able to manage work or participate in some social activities. Very difficult to concentrate. Signs of autonomic nervous system discharge are evident: dilated pupils (mydriasis); mild sweating (diaphoresis); sleep interference. Heart rate becomes elevated (>115 bpm). Diastolic blood pressure (lower number) rises above 100 mmHg. Patients find relief in laying down and not moving.   Severe pain 5 Intense and extremely unpleasant. Associated with frowning face and frequent crying. Pain overwhelms the senses.  Ability to do any activity or maintain social relationships becomes significantly limited. Conversation becomes difficult. Pacing back and forth is common, as  getting into a comfortable position is nearly impossible. Pain wakes you up from deep sleep. Physical signs will be obvious: pupillary dilation; increased sweating; goosebumps; brisk reflexes; cold, clammy hands and feet; nausea, vomiting or dry heaves; loss of appetite; significant sleep disturbance with inability to fall asleep or to remain asleep. When persistent, significant weight loss is observed due to the complete loss of appetite and sleep deprivation.  Blood pressure and heart rate becomes significantly elevated. Caution: If elevated blood pressure triggers a pounding headache associated with blurred vision, then the patient should immediately seek attention at an urgent or emergency care unit, as these may be signs of an impending stroke.    Emergency Department Pain Levels (6-10/10)  Emergency Room Pain 6 Severely limiting. Requires emergency care and should not be seen or managed at an outpatient pain management facility. Communication becomes difficult and requires great effort. Assistance to reach the emergency department may be required. Facial flushing and profuse sweating along with potentially dangerous increases in heart rate and blood pressure will be evident.   Distressing pain 7 Self-care is very difficult. Assistance is required to transport, or use restroom. Assistance to reach the emergency department will be required. Tasks requiring coordination, such as bathing and getting dressed become very difficult.   Disabling pain 8 Self-care is no longer possible. At this level, pain is disabling. The individual is unable to do even the most "basic" activities such as walking, eating, bathing, dressing, transferring to a bed, or toileting. Fine motor skills are lost. It is difficult to think clearly.   Incapacitating pain 9 Pain becomes incapacitating. Thought processing is no longer possible. Difficult to remember your own name. Control of movement and coordination are lost.   The  worst pain imaginable 10 At this level, most patients pass out from pain. When this level is reached, collapse of the autonomic nervous system occurs, leading to a sudden drop in blood pressure and heart rate. This in turn results in a temporary and dramatic drop in blood flow to the brain, leading to a loss of consciousness. Fainting is one of the body's self defense mechanisms. Passing out puts the brain in a calmed state and  causes it to shut down for a while, in order to begin the healing process.    Summary: 1. Refer to this scale when providing [place] with your pain level. 2. Be accurate and careful when reporting your pain level. This will help with your care. 3. Over-reporting your pain level will lead to loss of credibility. 4. Even a level of 1/10 means that there is pain and will be treated at our facility. 5. High, inaccurate reporting will be documented as "Symptom Exaggeration", leading to loss of credibility and suspicions of possible secondary gains such as obtaining more narcotics, or wanting to appear disabled, for fraudulent reasons. 6. Only pain levels of 5 or below will be seen at our facility. 7. Pain levels of 6 and above will be sent to the Emergency Department and the appointment cancelled. ____________________________________________________________________________________________   BMI Assessment: Estimated body mass index is 28.28 kg/m as calculated from the following:   Height as of this encounter: 5' 8" (1.727 m).   Weight as of this encounter: 186 lb (84.4 kg).  BMI interpretation table: BMI level Category Range association with higher incidence of chronic pain  <18 kg/m2 Underweight   18.5-24.9 kg/m2 Ideal body weight   25-29.9 kg/m2 Overweight Increased incidence by 20%  30-34.9 kg/m2 Obese (Class I) Increased incidence by 68%  35-39.9 kg/m2 Severe obesity (Class II) Increased incidence by 136%  >40 kg/m2 Extreme obesity (Class III) Increased incidence by 254%    BMI Readings from Last 4 Encounters:  01/05/18 28.28 kg/m  12/23/17 27.47 kg/m  11/23/17 28.03 kg/m  11/12/17 29.19 kg/m   Wt Readings from Last 4 Encounters:  01/05/18 186 lb (84.4 kg)  12/23/17 186 lb (84.4 kg)  11/23/17 189 lb 12.8 oz (86.1 kg)  11/12/17 192 lb (87.1 kg)

## 2018-01-06 DIAGNOSIS — G8929 Other chronic pain: Secondary | ICD-10-CM | POA: Insufficient documentation

## 2018-01-06 DIAGNOSIS — R0789 Other chest pain: Secondary | ICD-10-CM

## 2018-01-06 DIAGNOSIS — M546 Pain in thoracic spine: Secondary | ICD-10-CM

## 2018-01-09 LAB — COMP. METABOLIC PANEL (12)
A/G RATIO: 1.6 (ref 1.2–2.2)
ALBUMIN: 3.6 g/dL (ref 3.5–4.8)
AST: 25 IU/L (ref 0–40)
Alkaline Phosphatase: 47 IU/L (ref 39–117)
BUN/Creatinine Ratio: 9 — ABNORMAL LOW (ref 10–24)
BUN: 11 mg/dL (ref 8–27)
Bilirubin Total: 0.8 mg/dL (ref 0.0–1.2)
CHLORIDE: 105 mmol/L (ref 96–106)
CREATININE: 1.17 mg/dL (ref 0.76–1.27)
Calcium: 8.6 mg/dL (ref 8.6–10.2)
GFR calc Af Amer: 73 mL/min/{1.73_m2} (ref >59)
GFR calc non Af Amer: 63 mL/min/{1.73_m2} (ref >59)
GLOBULIN, TOTAL: 2.2 g/dL (ref 1.5–4.5)
Glucose: 94 mg/dL (ref 65–99)
POTASSIUM: 4.2 mmol/L (ref 3.5–5.2)
Sodium: 143 mmol/L (ref 134–144)
TOTAL PROTEIN: 5.8 g/dL — AB (ref 6.0–8.5)

## 2018-01-09 LAB — 25-HYDROXY VITAMIN D LCMS D2+D3
25-Hydroxy, Vitamin D-2: <1 ng/mL
25-Hydroxy, Vitamin D-3: 28 ng/mL
25-Hydroxy, Vitamin D: 28 ng/mL — ABNORMAL LOW

## 2018-01-09 LAB — MAGNESIUM: Magnesium: 2.2 mg/dL (ref 1.6–2.3)

## 2018-01-09 LAB — C-REACTIVE PROTEIN: CRP: 8.8 mg/L — ABNORMAL HIGH (ref 0.0–4.9)

## 2018-01-09 LAB — VITAMIN B12: VITAMIN B 12: 940 pg/mL (ref 232–1245)

## 2018-01-09 LAB — SEDIMENTATION RATE: SED RATE: 4 mm/h (ref 0–30)

## 2018-01-13 LAB — COMPLIANCE DRUG ANALYSIS, UR

## 2018-01-18 ENCOUNTER — Ambulatory Visit
Admission: RE | Admit: 2018-01-18 | Discharge: 2018-01-18 | Disposition: A | Discharge status: Home / Self Care | Payer: Medicare Other | Source: Ambulatory Visit | Attending: Nurse Practitioner | Admitting: Nurse Practitioner

## 2018-01-18 ENCOUNTER — Ambulatory Visit: Payer: Medicare Other | Admitting: Pain Medicine

## 2018-01-18 ENCOUNTER — Encounter
Admission: RE | Admit: 2018-01-18 | Discharge: 2018-01-18 | Disposition: A | Discharge status: Home / Self Care | Payer: Medicare Other | Source: Ambulatory Visit | Attending: Nurse Practitioner | Admitting: Nurse Practitioner

## 2018-01-18 ENCOUNTER — Telehealth: Payer: Self-pay | Admitting: Nurse Practitioner

## 2018-01-18 DIAGNOSIS — M47816 Spondylosis without myelopathy or radiculopathy, lumbar region: Secondary | ICD-10-CM | POA: Diagnosis not present

## 2018-01-18 DIAGNOSIS — I700 Atherosclerosis of aorta: Secondary | ICD-10-CM | POA: Diagnosis not present

## 2018-01-18 DIAGNOSIS — M549 Dorsalgia, unspecified: Secondary | ICD-10-CM | POA: Diagnosis present

## 2018-01-18 DIAGNOSIS — I77811 Abdominal aortic ectasia: Secondary | ICD-10-CM | POA: Insufficient documentation

## 2018-01-18 DIAGNOSIS — K8020 Calculus of gallbladder without cholecystitis without obstruction: Secondary | ICD-10-CM

## 2018-01-18 DIAGNOSIS — G8929 Other chronic pain: Secondary | ICD-10-CM

## 2018-01-18 NOTE — Telephone Encounter (Signed)
Attempted to call patient, message left. 

## 2018-01-18 NOTE — Telephone Encounter (Signed)
Christiane Ha at [facility] states patient was unable to complete test ordered because he could not lay down to have done.

## 2018-01-18 NOTE — Progress Notes (Signed)
Results were reviewed and found to be: mildly abnormal  No acute injury or pathology identified  Review would suggest interventional pain management techniques may be of benefit 

## 2018-01-18 NOTE — Telephone Encounter (Signed)
Can you please contact the patient so we can try something to help him have this completed. This is important to hopefully getting an diagnoses.  Thanks

## 2018-01-18 NOTE — Telephone Encounter (Signed)
fyi

## 2018-01-18 NOTE — Telephone Encounter (Signed)
Spoke with patient.  Patient states that he was unable to lie down for scan.  States that it required him to lie down for an hour, then again for another hour.  States he absolutely cant tolerate that.  States his pain is much worse when lying down. States he did have his xrays done.

## 2018-01-20 ENCOUNTER — Ambulatory Visit: Payer: Medicare Other | Attending: Pain Medicine | Admitting: Pain Medicine

## 2018-01-20 ENCOUNTER — Encounter: Payer: Self-pay | Admitting: Pain Medicine

## 2018-01-20 ENCOUNTER — Other Ambulatory Visit: Payer: Self-pay

## 2018-01-20 VITALS — BP 121/69 | HR 57 | Temp 98.0°F | Ht 69.0 in | Wt 185.0 lb

## 2018-01-20 DIAGNOSIS — G8929 Other chronic pain: Secondary | ICD-10-CM | POA: Diagnosis not present

## 2018-01-20 DIAGNOSIS — I5022 Chronic systolic (congestive) heart failure: Secondary | ICD-10-CM | POA: Insufficient documentation

## 2018-01-20 DIAGNOSIS — I130 Hypertensive heart and chronic kidney disease with heart failure and stage 1 through stage 4 chronic kidney disease, or unspecified chronic kidney disease: Secondary | ICD-10-CM | POA: Diagnosis not present

## 2018-01-20 DIAGNOSIS — M792 Neuralgia and neuritis, unspecified: Secondary | ICD-10-CM

## 2018-01-20 DIAGNOSIS — Z96651 Presence of right artificial knee joint: Secondary | ICD-10-CM | POA: Insufficient documentation

## 2018-01-20 DIAGNOSIS — R0789 Other chest pain: Secondary | ICD-10-CM | POA: Diagnosis not present

## 2018-01-20 DIAGNOSIS — M47814 Spondylosis without myelopathy or radiculopathy, thoracic region: Secondary | ICD-10-CM | POA: Insufficient documentation

## 2018-01-20 DIAGNOSIS — K219 Gastro-esophageal reflux disease without esophagitis: Secondary | ICD-10-CM | POA: Diagnosis not present

## 2018-01-20 DIAGNOSIS — K8020 Calculus of gallbladder without cholecystitis without obstruction: Secondary | ICD-10-CM | POA: Insufficient documentation

## 2018-01-20 DIAGNOSIS — M47819 Spondylosis without myelopathy or radiculopathy, site unspecified: Secondary | ICD-10-CM | POA: Diagnosis not present

## 2018-01-20 DIAGNOSIS — R1012 Left upper quadrant pain: Secondary | ICD-10-CM

## 2018-01-20 DIAGNOSIS — M5134 Other intervertebral disc degeneration, thoracic region: Secondary | ICD-10-CM | POA: Insufficient documentation

## 2018-01-20 DIAGNOSIS — M546 Pain in thoracic spine: Secondary | ICD-10-CM

## 2018-01-20 DIAGNOSIS — Z79899 Other long term (current) drug therapy: Secondary | ICD-10-CM | POA: Diagnosis not present

## 2018-01-20 DIAGNOSIS — K5730 Diverticulosis of large intestine without perforation or abscess without bleeding: Secondary | ICD-10-CM | POA: Insufficient documentation

## 2018-01-20 DIAGNOSIS — Z8781 Personal history of (healed) traumatic fracture: Secondary | ICD-10-CM | POA: Insufficient documentation

## 2018-01-20 DIAGNOSIS — Z9889 Other specified postprocedural states: Secondary | ICD-10-CM | POA: Insufficient documentation

## 2018-01-20 DIAGNOSIS — G894 Chronic pain syndrome: Secondary | ICD-10-CM | POA: Insufficient documentation

## 2018-01-20 DIAGNOSIS — Z87891 Personal history of nicotine dependence: Secondary | ICD-10-CM | POA: Insufficient documentation

## 2018-01-20 DIAGNOSIS — N183 Chronic kidney disease, stage 3 (moderate): Secondary | ICD-10-CM | POA: Diagnosis not present

## 2018-01-20 DIAGNOSIS — I700 Atherosclerosis of aorta: Secondary | ICD-10-CM | POA: Insufficient documentation

## 2018-01-20 DIAGNOSIS — R079 Chest pain, unspecified: Secondary | ICD-10-CM | POA: Insufficient documentation

## 2018-01-20 DIAGNOSIS — M5414 Radiculopathy, thoracic region: Secondary | ICD-10-CM | POA: Diagnosis not present

## 2018-01-20 DIAGNOSIS — M47894 Other spondylosis, thoracic region: Secondary | ICD-10-CM

## 2018-01-20 DIAGNOSIS — E538 Deficiency of other specified B group vitamins: Secondary | ICD-10-CM | POA: Insufficient documentation

## 2018-01-20 DIAGNOSIS — I712 Thoracic aortic aneurysm, without rupture: Secondary | ICD-10-CM | POA: Diagnosis not present

## 2018-01-20 DIAGNOSIS — I7121 Aneurysm of the ascending aorta, without rupture: Secondary | ICD-10-CM

## 2018-01-20 DIAGNOSIS — M549 Dorsalgia, unspecified: Secondary | ICD-10-CM

## 2018-01-20 DIAGNOSIS — M47816 Spondylosis without myelopathy or radiculopathy, lumbar region: Secondary | ICD-10-CM | POA: Insufficient documentation

## 2018-01-20 DIAGNOSIS — M47817 Spondylosis without myelopathy or radiculopathy, lumbosacral region: Secondary | ICD-10-CM | POA: Insufficient documentation

## 2018-01-20 MED ORDER — GABAPENTIN 100 MG PO CAPS: 100.0000 mg | ORAL_CAPSULE | Freq: Every day | ORAL | 0 refills | Status: DC

## 2018-01-20 NOTE — Patient Instructions (Addendum)
____________________________________________________________________________________________  Preparing for Procedure with Sedation  Instructions: . Oral Intake: Do not eat or drink anything for at least 8 hours prior to your procedure. . Transportation: Public transportation is not allowed. Bring an adult driver. The driver must be physically present in our waiting room before any procedure can be started. Marland Kitchen Physical Assistance: Bring an adult physically capable of assisting you, in the event you need help. This adult should keep you company at home for at least 6 hours after the procedure. . Blood Pressure Medicine: Take your blood pressure medicine with a sip of water the morning of the procedure. . Blood thinners:  . Diabetics on insulin: Notify the staff so that you can be scheduled 1st case in the morning. If your diabetes requires high dose insulin, take only  of your normal insulin dose the morning of the procedure and notify the staff that you have done so. . Preventing infections: Shower with an antibacterial soap the morning of your procedure. . Build-up your immune system: Take 1000 mg of Vitamin C with every meal (3 times a day) the day prior to your procedure. Marland Kitchen Antibiotics: Inform the staff if you have a condition or reason that requires you to take antibiotics before dental procedures. . Pregnancy: If you are pregnant, call and cancel the procedure. . Sickness: If you have a cold, fever, or any active infections, call and cancel the procedure. . Arrival: You must be in the facility at least 30 minutes prior to your scheduled procedure. . Children: Do not bring children with you. . Dress appropriately: Bring dark clothing that you would not mind if they get stained. . Valuables: Do not bring any jewelry or valuables.  Procedure appointments are reserved for interventional treatments only. Marland Kitchen No Prescription Refills. . No medication changes will be discussed during procedure  appointments. . No disability issues will be discussed.  Remember:  Regular Business hours are:  Monday to Thursday 8:00 AM to 4:00 PM  Provider's Schedule: Delano Metz, MD:  Procedure days: Tuesday and Thursday 7:30 AM to 4:00 PM  Edward Jolly, MD:  Procedure days: Monday and Wednesday 7:30 AM to 4:00 PM ____________________________________________________________________________________________   ____________________________________________________________________________________________  Initial Gabapentin Titration  Medication used: Gabapentin (Generic Name) or Neurontin (Brand Name) 100 mg tablets/capsules  Reasons to stop increasing the dose:  Reason 1: You get good relief of symptoms, in which case there is no need to increase the daily dose any further.    Reason 2: You develop some side effects, such as sleeping all of the time, difficulty concentrating, or becoming disoriented, in which case you need to go down on the dose, to the prior level, where you were not experiencing any side effects. Stay on that dose longer, to allow more time for your body to get use it, before attempting to increase it again.   Steps: Step 1: Start by taking 1 (one) tablet at bedtime x 7 (seven) days.  Step 2: After being on 1 (one) tablet for 7 (seven) days, then increase it to 2 (two) tablets at bedtime for another 7 (seven) days.  Step 3: Next, after being on 2 (two) tablets at bedtime for 7 (seven) days, then increase it to 3 (three) tablets at bedtime, and stay on that dose until you see your doctor.  Reasons to stop increasing the dose: Reason 1: You get good relief of symptoms, in which case there is no need to increase the daily dose any further.  Reason 2:  You develop some side effects, such as sleeping all of the time, difficulty concentrating, or becoming disoriented, in which case you need to go down on the dose, to the prior level, where you were not experiencing any side  effects. Stay on that dose longer, to allow more time for your body to get use it, before attempting to increase it again.  Endpoint: Once you have reached the maximum dose you can tolerate without side-effects, contact your physician so as to evaluate the results of the regimen.   Questions: Feel free to contact us for any questions or problems at [phone] ____________________________________________________________________________________________

## 2018-01-20 NOTE — Progress Notes (Signed)
Patient's Name: [name]  MRN: [id number]  Referring Provider: Rusty Aus, MD  DOB: 08/23/1945  PCP: Rusty Aus, MD  DOS: 01/20/2018  Note by: Gaspar Cola, MD  Service setting: Ambulatory outpatient  Specialty: Interventional Pain Management  Location: [facility] (AMB) Pain Management Facility    Patient type: Established   Primary Reason(s) for Visit: Encounter for evaluation before starting new chronic pain management plan of care (Level of risk: moderate) CC: Chest Pain (left side, back)  HPI  [name] is a 72 y.o. year old, male patient, who comes today for a follow-up evaluation to review the test results and decide on a treatment plan. He has Combined hyperlipidemia; RHEUMATIC FEVER; Benign essential hypertension; Cardiomyopathy, nonischemic ([place]); Asymptomatic PVCs; LUNG NODULE; Other osteoporosis; DIZZINESS; Nonspecific (abnormal) findings on radiological and other examination of body structure; Nonspecific abnormal findings on radiological and other examination of skull and head; Chronic systolic heart failure ([place]); Automatic implantable cardioverter-defibrillator in situ; Bruit; Abdominal pain (Left); Asthma; B12 deficiency; CKD (chronic kidney disease) stage 3, GFR 30-59 ml/min ([place]); Medicare annual wellness visit, initial; Positive Murphy's Sign; Chronic pain syndrome; Pharmacologic therapy; Disorder of skeletal system; Problems influencing health status; Chronic chest wall pain (Primary Area of Pain) (Left); Chronic thoracic back pain (Secondary Area of Pain) (Left); Thoracic Facet hypertrophy (Bilateral); Thoracic facet syndrome (Bilateral); DDD (degenerative disc disease), thoracic; Thoracic radiculitis (Left); Chronic upper back pain (Left); Aortic atherosclerosis ([place]); Lumbar facet arthropathy; Spondylosis without myelopathy or radiculopathy, thoracic region; Spondylosis without myelopathy or radiculopathy, lumbosacral region; History of rib fracture; Diverticulosis,  sigmoid; Cholelithiasis; Thoracic ascending aortic aneurysm (HCC) (4.2 cm); Hx of total knee replacement (Right); Chronic left upper quadrant pain (Left); and Neurogenic pain on their problem list. His primarily concern today is the Chest Pain (left side, back)  Pain Assessment: Location: Left Rib cage Radiating: Pain radiates to lower back, pain also in left groin area Onset: More than a month ago Duration: Chronic pain Quality: Nagging Severity: 4 /10 (subjective, self-reported pain score)  Note: Reported level is compatible with observation.                         When using our objective Pain Scale, levels between 6 and 10/10 are said to belong in an emergency room, as it progressively worsens from a 6/10, described as severely limiting, requiring emergency care not usually available at an outpatient pain management facility. At a 6/10 level, communication becomes difficult and requires great effort. Assistance to reach the emergency department may be required. Facial flushing and profuse sweating along with potentially dangerous increases in heart rate and blood pressure will be evident. Effect on ADL: hard to sleep and more painful with deep breaths Timing: Constant Modifying factors: pain comes and goes BP: 121/69  HR: (!) 66  [name] comes in today for a follow-up visit after his initial evaluation on 01/18/2018. Today we went over the results of his tests. These were explained in "Layman's terms". During today's appointment we went over my diagnostic impression, as well as the proposed treatment plan.  According to the patient his primary area of pain is in his left chest (B) (L>R). He admits that he has had this pain for approximately 2 years it has gotten worse in the last 6-7 months. He admits that he fell over a chair broke some ribs (L) however he was having this pain prior to this fall. He admits that the ribs have healed.  He admits that the pain is worse at night when he is  lying down. He admits that it affects his breathing. He admits that getting up and walking helps the pain for a while. He admits that the pain does radiate around to his back He admits that he does have a cardiac history and he is s/p defibrillator in 2008. He states that after having not used the defibrillator at the time of battery change the defibrillator was removed. He admits that the leads are still in place. He has had CT scan of chest, abdomen and thoracic spine. He admits that he is an ex-smoker and the most recent chest CT did show some slight abnormality. He admits that he has lost approximately 60 pounds in the last 2 years (not on purpose-unexplained). He admits that he has had trigger point injections. He admits the first one lasted approximately 2 months however overall not effective. He admits that he has failed gabapentin trial (not effective) has never tried Lyrica. He admits that recently he was on prednisone this did make him feel better (improved appetite, but not pain) and he was able to eat more.   His next area to pain is in his middle back (thoracic, bilateral) (L>R). He denies any previous surgeries. He did have trigger point injections x2 (4 different areas in the back) by Dr. Sabra Heck which were effective for approximately 2 months. He denies any physical therapy. He has had CT of the thoracic spine.  In considering the treatment plan options, [name] was reminded that I no longer take patients for medication management only. I asked him to let me know if he had no intention of taking advantage of the interventional therapies, so that we could make arrangements to provide this space to someone interested. I also made it clear that undergoing interventional therapies for the purpose of getting pain medications is very inappropriate on the part of a patient, and it will not be tolerated in this practice. This type of behavior would suggest true addiction and therefore it requires  referral to an addiction specialist.   Further details on both, my assessment(s), as well as the proposed treatment plan, please see below.  Controlled Substance Pharmacotherapy Assessment REMS (Risk Evaluation and Mitigation Strategy)  Analgesic: none Highest recorded MME/day: 50 mg/day MME/day:0 mg/day Pill Count: None expected due to no prior prescriptions written by our practice. No notes on file Pharmacokinetics: Liberation and absorption (onset of action): WNL Distribution (time to peak effect): WNL Metabolism and excretion (duration of action): WNL         Pharmacodynamics: Desired effects: Analgesia: Mr. Antrim reports >50% benefit. Functional ability: Patient reports that medication allows him to accomplish basic ADLs Clinically meaningful improvement in function (CMIF): Sustained CMIF goals met Perceived effectiveness: Described as relatively effective, allowing for increase in activities of daily living (ADL) Undesirable effects: Side-effects or Adverse reactions: None reported Monitoring: [place] PMP: Online review of the past 8-monthperiod previously conducted. Not applicable at this point since we have not taken over the patient's medication management yet. List of other Serum/Urine Drug Screening Test(s):  No results found. List of all UDS test(s) done:  Lab Results  Component Value Date   SUMMARY FINAL 01/05/2018   Last UDS on record: Summary  Date Value Ref Range Status  01/05/2018 FINAL  Final    Comment:    ==================================================================== TOXASSURE COMP DRUG ANALYSIS,UR ==================================================================== Test  Result       Flag       Units Drug Present not Declared for Prescription Verification   Acetaminophen                  PRESENT      UNEXPECTED ==================================================================== Test                      Result    Flag    Units      Ref Range   Creatinine              273              mg/dL      >=20 ==================================================================== Declared Medications:  The flagging and interpretation on this report are based on the  following declared medications.  Unexpected results may arise from  inaccuracies in the declared medications.  **Note: The testing scope of this panel does not include following  reported medications:  Amiodarone  Atorvastatin  Carvedilol  Cyanocobalamin  Furosemide  Hydralazine  Isosorbide Mononitrate  Oxygen  Pantoprazole  Potassium  Topical ==================================================================== For clinical consultation, please call [phone]. ====================================================================    UDS interpretation: No unexpected findings.          Medication Assessment Form: Patient introduced to form today Treatment compliance: Treatment may start today if patient agrees with proposed plan. Evaluation of compliance is not applicable at this point Risk Assessment Profile: Aberrant behavior: See initial evaluations. None observed or detected today Comorbid factors increasing risk of overdose: See initial evaluation. No additional risks detected today Medical Psychology Evaluation: Please see scanned results in medical record. Opioid Risk Tool - 01/05/18 0824      Family History of Substance Abuse   Alcohol  Negative    Illegal Drugs  Negative    Rx Drugs  Negative      Personal History of Substance Abuse   Alcohol  Negative    Illegal Drugs  Negative    Rx Drugs  Negative      Psychological Disease   Psychological Disease  Negative    Depression  Negative      Total Score   Opioid Risk Tool Scoring  0    Opioid Risk Interpretation  Low Risk      ORT Scoring interpretation table:  Score <3 = Low Risk for SUD  Score between 4-7 = Moderate Risk for SUD  Score >8 = High Risk for Opioid Abuse    Risk Mitigation Strategies:  Patient opioid safety counseling: No controlled substances prescribed. Patient-Prescriber Agreement (PPA): No agreement signed.  Controlled substance notification to other providers: None required. No opioid therapy.  Pharmacologic Plan: No opioid analgesic prescribed.             Laboratory Chemistry  Inflammation Markers (CRP: Acute Phase) (ESR: Chronic Phase) Lab Results  Component Value Date   CRP 8.8 (H) 01/05/2018   ESRSEDRATE 4 01/05/2018  NOTE: Elevated CRP is usually seen with acute phase inflammatory disease.  Rheumatology Markers Lab Results  Component Value Date   RF 11.6 11/12/2017   ANA Negative 11/12/2017                        Renal Function Markers Lab Results  Component Value Date   BUN 11 01/05/2018   CREATININE 1.17 01/05/2018   BCR 9 (L) 01/05/2018   GFRAA 73 01/05/2018   GFRNONAA 63 01/05/2018  Hepatic Function Markers Lab Results  Component Value Date   AST 25 01/05/2018   ALT 18 04/12/2014   ALBUMIN 3.6 01/05/2018   ALKPHOS 47 01/05/2018                        Electrolytes Lab Results  Component Value Date   NA 143 01/05/2018   K 4.2 01/05/2018   CL 105 01/05/2018   CALCIUM 8.6 01/05/2018   MG 2.2 01/05/2018                        Neuropathy Markers Lab Results  Component Value Date   VITAMINB12 940 01/05/2018                        Bone Pathology Markers Lab Results  Component Value Date   25OHVITD1 28 (L) 01/05/2018   25OHVITD2 <1.0 01/05/2018   25OHVITD3 28 01/05/2018                         Coagulation Parameters Lab Results  Component Value Date   INR 0.90 10/08/2012   LABPROT 12.1 10/08/2012   PLT 189 03/09/2014                        Cardiovascular Markers Lab Results  Component Value Date   BNP 86.3 09/18/2014   TROPONINI < 0.02 03/09/2014   HGB 14.1 03/09/2014   HCT 41.8 03/09/2014                         Note: Lab results reviewed.  Recent  Diagnostic Imaging Review  Shoulder Imaging: Shoulder-L DG:  Results for orders placed during the hospital encounter of 08/09/15  DG Shoulder Left   Narrative CLINICAL DATA:  Left shoulder pain after fall down stairs. Initial encounter.  EXAM: LEFT SHOULDER - 2+ VIEW  COMPARISON:  None.  FINDINGS: Soft tissue swelling over the [facility] joint without widening or offset.  Located glenohumeral joint.  No evidence of acute fracture.  AC joint osteoarthritis with moderate spurring.  IMPRESSION: Soft tissue swelling without fracture or dislocation.   Electronically Signed   By: Monte Fantasia M.D.   On: 08/09/2015 01:38    Thoracic Imaging: Thoracic CT wo contrast:  Results for orders placed during the hospital encounter of 11/06/17  CT THORACIC SPINE WO CONTRAST   Narrative CLINICAL DATA:  72 year old male with intermittent left chest pain radiating to the mid back with progressive symptoms for 4-6 months. No known injury.  EXAM: CT THORACIC SPINE WITHOUT CONTRAST  TECHNIQUE: Multidetector CT images of the thoracic were obtained using the standard protocol without intravenous contrast.  COMPARISON:  Chest abdomen and pelvis CT 10/23/2017, and earlier.  FINDINGS: Limited cervical spine imaging:  Normal for age.  Thoracic spine segmentation: Normal. Incidental incompletely fused ossification center of the left L1 transverse process.  Alignment: Stable and normal thoracic kyphosis. No spondylolisthesis.  Vertebrae: Osteopenia. No acute or suspicious osseous lesion in the thoracic spine. The visible posterior ribs appear intact. Chronic interbody ankylosis at T5-T6, T6-T7, and T9-T10 related to bulky right anterolateral endplate osteophytes.  Paraspinal and other soft tissues: Left chest cardiac pacemaker device. Calcified aortic atherosclerosis. Stable thoracic and visible upper abdominal aorta. Calcified coronary artery atherosclerosis. No pericardial or pleural  effusion. Negative visible upper abdominal viscera. The major airways are patent.  There are new multifocal bilateral areas of peripheral and peribronchial pulmonary ground-glass opacity in the bilateral visible upper lobes. The right middle lobe also appears affected (series 7, image 110). There is more nodular and solid-appearing involvement of the bilateral lower lobes in the costophrenic angles. These pulmonary findings are new since 10/23/2017.  Disc levels:  Capacious thoracic spinal canal. No CT evidence of thoracic spinal stenosis. Notable degeneration as follows:  T3-T4: Moderate facet and ligament flavum hypertrophy on the left. No stenosis.  T4-T5: Moderate to severe bilateral facet and ligament flavum hypertrophy. Right side vacuum facet. No stenosis.  T8-T9: Circumferential disc bulge. Bulky left costovertebral degenerative spurring. No stenosis.  T10-T11: Vacuum disc.  No stenosis.  T11-T12: Vacuum disc.  No stenosis.  IMPRESSION: 1. No acute osseous abnormality in the thoracic spine and fairly mild for age thoracic spine degeneration. Capacious thoracic spinal canal. No spinal stenosis or neural impingement suspected. 2. New multifocal bilateral pulmonary ground-glass opacity is new since the chest CT on 10/23/2017 and appears inflammatory. No associated pleural effusion. Consider viral/atypical respiratory infection with differential considerations including mycoplasma, adenovirus, influenza, etc. 3. Stable visible aorta, aortic and coronary artery calcified atherosclerosis.   Electronically Signed   By: Genevie Ann M.D.   On: 11/06/2017 15:26    Lumbosacral Imaging: Lumbar DG Bending views:  Results for orders placed during the hospital encounter of 01/18/18  DG Lumbar Spine Complete W/Bend   Narrative CLINICAL DATA:  Chronic left-sided low back pain. Pain worse with deep breathing.  EXAM: LUMBAR SPINE - COMPLETE WITH BENDING VIEWS  COMPARISON:  CT  abdomen and pelvis 10/23/2017  FINDINGS: Five non rib-bearing lumbar type vertebral bodies are present. Vertebral body heights and alignment are maintained. Facet degenerative changes most evident at L5-S1 bilaterally, right greater than left. L4-5 facet disease is present as well.  No acute fracture traumatic subluxation is present. Aortic atherosclerosis is again noted. Recent CT measurement was 2.8 cm.  IMPRESSION: 1. Degenerative changes of the lumbar facets at L4-5 and L5-S1 bilaterally. 2. No acute abnormality. 3.  Aortic Atherosclerosis (ICD10-I70.0). 4. Ectatic abdominal aorta at risk for aneurysm development. Recommend followup by ultrasound in 5 years. This recommendation follows ACR consensus guidelines: White Paper of the ACR Incidental Findings Committee II on Vascular Findings. J Am Coll Radiol 2013; 10:789-794.   Electronically Signed   By: San Morelle M.D.   On: 01/18/2018 07:47    Complexity Note: Imaging results reviewed. Results shared with Mr. Geffre, using Layman's terms.                         Meds   Current Outpatient Medications:  .  amiodarone (PACERONE) 200 MG tablet, TAKE 1 TABLET (200 MG TOTAL) BY MOUTH DAILY., Disp: 90 tablet, Rfl: 1 .  atorvastatin (LIPITOR) 40 MG tablet, TAKE 0.5 TABLETS (20 MG TOTAL) BY MOUTH DAILY., Disp: 90 tablet, Rfl: 2 .  carvedilol (COREG) 25 MG tablet, TAKE 2 TABLETS (50 MG TOTAL) BY MOUTH 2 (TWO) TIMES DAILY WITH A MEAL., Disp: 360 tablet, Rfl: 3 .  Cyanocobalamin (VITAMIN B12 SL), Place 1 tablet under the tongue daily., Disp: , Rfl:  .  furosemide (LASIX) 20 MG tablet, TAKE 2 TABLETS (40 MG TOTAL) BY MOUTH DAILY. TAKE AN EXTRA 20MG DAILY AS NEEDED, Disp: 180 tablet, Rfl: 1 .  hydrALAZINE (APRESOLINE) 50 MG tablet, TAKE 1 & 1/2 TABLET BY MOUTH 3 TIMES DAILY, Disp: 405 tablet, Rfl: 1 .  Lido-Capsaicin-Men-Methyl Sal (  1ST MEDX-PATCH/ LIDOCAINE) 4-0.025-5-20 % PTCH, Apply 1 patch topically at bedtime., Disp: 30  patch, Rfl: 5 .  OXYGEN, Inhale 2L for 8 hours at night., Disp: , Rfl:  .  pantoprazole (PROTONIX) 40 MG tablet, Take 40 mg by mouth daily. , Disp: , Rfl:  .  potassium chloride (K-DUR) 10 MEQ tablet, Take 10 mEq by mouth daily., Disp: , Rfl:  .  gabapentin (NEURONTIN) 100 MG capsule, Take 1-3 capsules (100-300 mg total) by mouth at bedtime. Follow written titration schedule., Disp: 90 capsule, Rfl: 0 .  isosorbide mononitrate (IMDUR) 30 MG 24 hr tablet, Take 1 tablet (30 mg total) by mouth daily., Disp: , Rfl:   ROS  Constitutional: Denies any fever or chills Gastrointestinal: No reported hemesis, hematochezia, vomiting, or acute GI distress Musculoskeletal: Denies any acute onset joint swelling, redness, loss of ROM, or weakness Neurological: No reported episodes of acute onset apraxia, aphasia, dysarthria, agnosia, amnesia, paralysis, loss of coordination, or loss of consciousness  Allergies  Mr. Hubbert is allergic to ace inhibitors; aspirin; indocin [indomethacin]; mobic [meloxicam]; and nsaids.  PFSH  Drug: Mr. Trulson  reports that he does not use drugs. Alcohol:  reports that he drinks alcohol. Tobacco:  reports that he quit smoking about 47 years ago. His smoking use included cigarettes. He has a 10.00 pack-year smoking history. He has never used smokeless tobacco. Medical:  has a past medical history of Abnormal chest CT, Benign hypertension, Cardiomyopathy, CHF (congestive heart failure) ([place]), Chronic renal insufficiency, GERD (gastroesophageal reflux disease), Hives, Hyperlipidemia, IMPLANTATION OF DEFIBRILLATOR, HX OF (02/15/2009), NSVT (nonsustained ventricular tachycardia) (HCC), Osteoarthritis, Osteoarthrosis, unspecified whether generalized or localized, lower leg, Osteoporosis, Oxygen deficiency, Psoriasis, PVC (premature ventricular contraction), and Urticaria. Surgical: Mr. Sartin  has a past surgical history that includes Knee Arthroplasty (Left, 2006); Knee Arthroplasty  (Right, 12/10); Implantation of dual-chamber ICD.; Cardiac defibrillator removal (2014); Biv icd genertaor change out (N/A, 10/08/2012); Cardiac catheterization; Cataract extraction; Cataract extraction w/PHACO (Left, 03/12/2016); and Tonsillectomy. Family: family history includes Cancer in his mother; Heart attack in his father; Lung cancer in his mother.  Constitutional Exam  General appearance: Well nourished, well developed, and well hydrated. In no apparent acute distress Vitals:   01/20/18 0818  BP: 121/69  Pulse: (!) 57  Temp: 98 F (36.7 C)  SpO2: 100%  Weight: 185 lb (83.9 kg)  Height: _0  (1.753 m)   BMI Assessment: Estimated body mass index is 27.32 kg/m as calculated from the following:   Height as of this encounter: _1  (1.753 m).   Weight as of this encounter: 185 lb (83.9 kg).  BMI interpretation table: BMI level Category Range association with higher incidence of chronic pain  <18 kg/m2 Underweight   18.5-24.9 kg/m2 Ideal body weight   25-29.9 kg/m2 Overweight Increased incidence by 20%  30-34.9 kg/m2 Obese (Class I) Increased incidence by 68%  35-39.9 kg/m2 Severe obesity (Class II) Increased incidence by 136%  >40 kg/m2 Extreme obesity (Class III) Increased incidence by 254%   Patient's current BMI Ideal Body weight  Body mass index is 27.32 kg/m. Ideal body weight: 70.7 kg (155 lb 13.8 oz) Adjusted ideal body weight: 76 kg (167 lb 8.3 oz)   BMI Readings from Last 4 Encounters:  01/20/18 27.32 kg/m  01/05/18 28.28 kg/m  12/23/17 27.47 kg/m  11/23/17 28.03 kg/m   Wt Readings from Last 4 Encounters:  01/20/18 185 lb (83.9 kg)  01/05/18 186 lb (84.4 kg)  12/23/17 186 lb (84.4 kg)  11/23/17  189 lb 12.8 oz (86.1 kg)  Psych/Mental status: Alert, oriented x 3 (person, place, & time)       Eyes: PERLA Respiratory: No evidence of acute respiratory distress  Cervical Spine Area Exam  Skin & Axial Inspection: No masses, redness, edema, swelling, or  associated skin lesions Alignment: Symmetrical Functional ROM: Unrestricted ROM      Stability: No instability detected Muscle Tone/Strength: Functionally intact. No obvious neuro-muscular anomalies detected. Sensory (Neurological): Unimpaired Palpation: No palpable anomalies              Upper Extremity (UE) Exam    Side: Right upper extremity  Side: Left upper extremity  Skin & Extremity Inspection: Skin color, temperature, and hair growth are WNL. No peripheral edema or cyanosis. No masses, redness, swelling, asymmetry, or associated skin lesions. No contractures.  Skin & Extremity Inspection: Skin color, temperature, and hair growth are WNL. No peripheral edema or cyanosis. No masses, redness, swelling, asymmetry, or associated skin lesions. No contractures.  Functional ROM: Unrestricted ROM          Functional ROM: Unrestricted ROM          Muscle Tone/Strength: Functionally intact. No obvious neuro-muscular anomalies detected.  Muscle Tone/Strength: Functionally intact. No obvious neuro-muscular anomalies detected.  Sensory (Neurological): Unimpaired          Sensory (Neurological): Unimpaired          Palpation: No palpable anomalies              Palpation: No palpable anomalies              Provocative Test(s):  Phalen's test: deferred Tinel's test: deferred Apley's scratch test (touch opposite shoulder):  Action 1 (Across chest): deferred Action 2 (Overhead): deferred Action 3 (LB reach): deferred   Provocative Test(s):  Phalen's test: deferred Tinel's test: deferred Apley's scratch test (touch opposite shoulder):  Action 1 (Across chest): deferred Action 2 (Overhead): deferred Action 3 (LB reach): deferred    Thoracic Spine Area Exam  Skin & Axial Inspection: No masses, redness, or swelling Alignment: Symmetrical Functional ROM: Unrestricted ROM Stability: No instability detected Muscle Tone/Strength: Functionally intact. No obvious neuro-muscular anomalies  detected. Sensory (Neurological): Unimpaired Muscle strength & Tone: No palpable anomalies  Lumbar Spine Area Exam  Skin & Axial Inspection: No masses, redness, or swelling Alignment: Symmetrical Functional ROM: Unrestricted ROM       Stability: No instability detected Muscle Tone/Strength: Functionally intact. No obvious neuro-muscular anomalies detected. Sensory (Neurological): Unimpaired Palpation: No palpable anomalies       Provocative Tests: Lumbar Hyperextension/rotation test: deferred today       Lumbar quadrant test (Kemp's test): deferred today       Lumbar Lateral bending test: deferred today       Patrick's Maneuver: deferred today                   FABER test: deferred today       Thigh-thrust test: deferred today       S-I compression test: deferred today       S-I distraction test: deferred today        Gait & Posture Assessment  Ambulation: Unassisted Gait: Relatively normal for age and body habitus Posture: WNL   Lower Extremity Exam    Side: Right lower extremity  Side: Left lower extremity  Stability: No instability observed          Stability: No instability observed  Skin & Extremity Inspection: Skin color, temperature, and hair growth are WNL. No peripheral edema or cyanosis. No masses, redness, swelling, asymmetry, or associated skin lesions. No contractures.  Skin & Extremity Inspection: Skin color, temperature, and hair growth are WNL. No peripheral edema or cyanosis. No masses, redness, swelling, asymmetry, or associated skin lesions. No contractures.  Functional ROM: Unrestricted ROM                  Functional ROM: Unrestricted ROM                  Muscle Tone/Strength: Functionally intact. No obvious neuro-muscular anomalies detected.  Muscle Tone/Strength: Functionally intact. No obvious neuro-muscular anomalies detected.  Sensory (Neurological): Unimpaired  Sensory (Neurological): Unimpaired  Palpation: No palpable anomalies  Palpation: No  palpable anomalies   Assessment & Plan  Primary Diagnosis & Pertinent Problem List: The primary encounter diagnosis was Chronic pain syndrome. Diagnoses of Chronic chest wall pain (Primary Area of Pain) (Left), Chronic thoracic back pain (Secondary Area of Pain) (Left), History of rib fracture, Thoracic ascending aortic aneurysm (HCC) (4.2 cm), DDD (degenerative disc disease), thoracic, Thoracic Facet hypertrophy (Bilateral), Spondylosis without myelopathy or radiculopathy, thoracic region, Thoracic facet syndrome (Bilateral), Chronic left upper quadrant pain (Left), Thoracic radiculitis (Left), and Neurogenic pain were also pertinent to this visit.  Visit Diagnosis: 1. Chronic pain syndrome   2. Chronic chest wall pain (Primary Area of Pain) (Left)   3. Chronic thoracic back pain (Secondary Area of Pain) (Left)   4. History of rib fracture   5. Thoracic ascending aortic aneurysm (HCC) (4.2 cm)   6. DDD (degenerative disc disease), thoracic   7. Thoracic Facet hypertrophy (Bilateral)   8. Spondylosis without myelopathy or radiculopathy, thoracic region   9. Thoracic facet syndrome (Bilateral)   10. Chronic left upper quadrant pain (Left)   11. Thoracic radiculitis (Left)   12. Neurogenic pain    Problems updated and reviewed during this visit: Problem  Thoracic Facet hypertrophy (Bilateral)  Thoracic facet syndrome (Bilateral)  Ddd (Degenerative Disc Disease), Thoracic  Thoracic radiculitis (Left)  Chronic upper back pain (Left)  Lumbar Facet Arthropathy  Spondylosis Without Myelopathy Or Radiculopathy, Thoracic Region  Spondylosis Without Myelopathy Or Radiculopathy, Lumbosacral Region  History of Rib Fracture  Hx of total knee replacement (Right)  Chronic left upper quadrant pain (Left)  Neurogenic Pain  Chronic chest wall pain (Primary Area of Pain) (Left)  Chronic thoracic back pain (Secondary Area of Pain) (Left)  Chronic Pain Syndrome  Abdominal pain (Left)  Pharmacologic  Therapy  Disorder of Skeletal System  Problems Influencing Health Status  B12 Deficiency   Overview:  231, 2/18   Ckd (Chronic Kidney Disease) Stage 3, Gfr 30-59 Ml/Min (Hcc)   Overview:  Cr 1.7, 1.3 on 2/19   Automatic Implantable Cardioverter-Defibrillator in Situ  RHEUMATIC FEVER   Qualifier: Diagnosis of  By: Stanford Breed, MD, Kandyce Rud    Aortic Atherosclerosis (Hcc)  Diverticulosis, Sigmoid  Cholelithiasis  Thoracic ascending aortic aneurysm ([place]) (4.2 cm)  Positive Murphy's Sign  Medicare Annual Wellness Visit, Initial   Overview:  2/18, 2/19   Asthma   Overview:  Nocturnal O2, 2L   Bruit  Chronic systolic heart failure (HCC)  Benign Essential Hypertension   Qualifier: Diagnosis of  By: Lonia Blood, RN, Debra     Cardiomyopathy, nonischemic ([place])   Qualifier: Diagnosis of  By: Stanford Breed, MD, Kandyce Rud   Overview:  EF 30%   LUNG NODULE   Qualifier:  Diagnosis of  By: Lonia Blood RN, Debra     DIZZINESS   Qualifier: Diagnosis of  By: Lonia Blood RN, Debra     Nonspecific (Abnormal) Findings On Radiological and Other Examination of Body Structure   Qualifier: Diagnosis of By: Stanford Breed, MD, Kandyce Rud  Problem list entry automatically replaced. Please review for accuracy.   Nonspecific Abnormal Findings On Radiological and Other Examination of Skull and Head   Qualifier: Diagnosis of  By: Stanford Breed, MD, Kandyce Rud    Combined Hyperlipidemia   Qualifier: Diagnosis of  By: Stanford Breed, MD, Kandyce Rud    Asymptomatic Pvcs   Qualifier: Diagnosis of  By: Stanford Breed, MD, Kandyce Rud    Other Osteoporosis   Qualifier: Diagnosis of  By: Stanford Breed, MD, Kandyce Rud    IMPLANTATION OF DEFIBRILLATOR, HX OF (Resolved)   Qualifier: Diagnosis of  By: Stanford Breed, MD, Kandyce Rud     Time Note: Greater than 50% of the 40 minute(s) of face-to-face time spent with Mr. Depoy, was spent in  counseling/coordination of care regarding: Mr. Perry's primary cause of pain, the results of his recent test(s), the significance of each one oth the test(s) anomalies and it's corresponding characteristic pain pattern(s), the treatment plan, treatment alternatives, the risks and possible complications of proposed treatment, realistic expectations, the goals of pain management (increased in functionality) and the need to collect and read the AVS material. Plan of Care  Pharmacotherapy (Medications Ordered): Meds ordered this encounter  Medications  . gabapentin (NEURONTIN) 100 MG capsule    Sig: Take 1-3 capsules (100-300 mg total) by mouth at bedtime. Follow written titration schedule.    Dispense:  90 capsule    Refill:  0    Do not place medication on "Automatic Refill". Fill one day early if pharmacy is closed on scheduled refill date.    Procedure Orders     Thoracic Epidural Injection Lab Orders  No laboratory test(s) ordered today   Imaging Orders  No imaging studies ordered today   Referral Orders  No referral(s) requested today    Pharmacological management options:  Opioid Analgesics: I will not be prescribing any opioids at this time Membrane stabilizer: Neurontin Trial Muscle relaxant: We have discussed the possibility of a trial NSAID: We have discussed the possibility of a trial Other analgesic(s): To be determined at a later time   Interventional management options: Planned, scheduled, and/or pending:    Diagnostic Left thoracic ESI #1 under fluoro,    Considering:   Diagnostic bilateral thoracic facet nerve block   Possible bilateral thoracic facet RFA  Diagnostic trigger point injection  Diagnostic left sided TESI    PRN Procedures:   None at this time   Provider-requested follow-up: Return for Procedure (w/ sedation): (L) T4-5 TESI #1.  Future Appointments  Date Time Provider [place]  01/21/2018  8:30 AM Wilhelmina Mcardle, MD [id number] None     Primary Care Physician: Rusty Aus, MD Location: [facility] Note by: Gaspar Cola, MD Date: 01/20/2018; Time: 8:44 AM

## 2018-01-21 ENCOUNTER — Ambulatory Visit: Payer: Medicare Other | Admitting: Pulmonary Disease

## 2018-01-21 ENCOUNTER — Encounter: Payer: Self-pay | Admitting: Pulmonary Disease

## 2018-01-21 VITALS — BP 118/66 | HR 55 | Ht 69.0 in | Wt 189.0 lb

## 2018-01-21 DIAGNOSIS — R079 Chest pain, unspecified: Secondary | ICD-10-CM

## 2018-01-21 NOTE — Patient Instructions (Signed)
Follow-up with me as needed for any chest, pulmonary or breathing issues

## 2018-01-21 NOTE — Progress Notes (Signed)
PULMONARY OFFICE FOLLOW-UP NOTE  Requesting MD/Service: Emily Filbert, MD Date of initial consultation: 11/12/17 Reason for consultation: L sided CP, new ground glass opacities on CT  PT PROFILE: 72 y.o. male former (remote) smoker with vague ground glass opacities noted on CT of T spine (after recent normal CT chest) which was performed to evaluate chronic L sided thoracic pain  DATA: 10/12/13 Spirometry: Mild obstruction with FEV1 77% predicted 12/12/14 PFTs Jefm Bryant): Spirometry not available, lung volumes normal, diffusion capacity normal 11/06/16 echocardiogram: LVEF 35-40%.  Mild LVH.  Basal-mid lateral and inferolateral hypokinesis 10/23/17 CTA chest: 4.2 cm ascending TAA. No pleural, chest wall or pulmonary findings to explain chest pain 11/06/17 CT T-spine: No spinal stenosis or neural impingement suspected. New multifocal bilateral pulmonary ground-glass opacity is new since the chest CT on 10/23/2017 and appears inflammatory 12/17/17: Mild obstruction, lung volumes normal, DLCO 51% predicted, DLCO/VA 69% predicted 12/23/17 CTA chest: Stable ascending aortic aneurysm. No evidence of pulmonary emboli. Resolution of previously see ground glass opacities  INTERVAL: Major pulmonary events   SUBJ:  He has no new complaints.  He is now being seen by pain management and he informs me that his left-sided chest pain is thought to be radicular in nature.  Several therapeutic options are being considered at this time.  He has been started on gabapentin.    OBJ: Vitals:   01/21/18 0814 01/21/18 0817  BP:  118/66  Pulse:  (!) 55  SpO2:  100%  Weight: 189 lb (85.7 kg)   Height: 5' 9"  (1.753 m)   Room air   EXAM:  Gen: NAD HEENT: NCAT, sclera white Neck: No JVD Lungs: breath sounds full, no wheezes or other adventitious sounds Cardiovascular: RRR, no murmurs Abdomen: Soft, nontender, normal BS Ext: without clubbing, cyanosis, edema Neuro: grossly intact Skin: Limited exam, no  lesions noted    DATA:   BMP Latest Ref Rng & Units 01/05/2018 12/23/2017 08/02/2015  Glucose 65 - 99 mg/dL 94 - -  BUN 8 - 27 mg/dL 11 - -  Creatinine 0.76 - 1.27 mg/dL 1.17 1.30(H) 1.30(H)  BUN/Creat Ratio 10 - 24 9(L) - -  Sodium 134 - 144 mmol/L 143 - -  Potassium 3.5 - 5.2 mmol/L 4.2 - -  Chloride 96 - 106 mmol/L 105 - -  CO2 19 - 32 mEq/L - - -  Calcium 8.6 - 10.2 mg/dL 8.6 - -    CBC Latest Ref Rng & Units 03/09/2014 10/09/2008  WBC 3.8 - 10.6 x10 3/mm 3 8.7 8.5  Hemoglobin 13.0 - 18.0 g/dL 14.1 13.6  Hematocrit 40.0 - 52.0 % 41.8 38.3(L)  Platelets 150 - 440 x10 3/mm 3 189 235   ESR: 18 mm/hr ANA: Negative RF: 11.6 (0-13.9)  CXR: No new film  I have personally reviewed all chest radiographs reported above including CXRs and CT chest unless otherwise indicated  IMPRESSION:   Left sided thoracic pain, radicular pain   PLAN:  No further pulmonary evaluation is warranted at this time. Follow-up as needed   Merton Border, MD PCCM service Mobile [phone] Pager [phone] 01/21/2018 3:38 PM

## 2018-01-26 ENCOUNTER — Encounter: Payer: Self-pay | Admitting: Pain Medicine

## 2018-02-04 ENCOUNTER — Encounter: Payer: Self-pay | Admitting: Pain Medicine

## 2018-02-04 ENCOUNTER — Ambulatory Visit
Admission: RE | Admit: 2018-02-04 | Discharge: 2018-02-04 | Disposition: A | Discharge status: Home / Self Care | Payer: Medicare Other | Source: Ambulatory Visit | Attending: Pain Medicine | Admitting: Pain Medicine

## 2018-02-04 ENCOUNTER — Other Ambulatory Visit: Payer: Self-pay

## 2018-02-04 ENCOUNTER — Ambulatory Visit (HOSPITAL_BASED_OUTPATIENT_CLINIC_OR_DEPARTMENT_OTHER): Payer: Medicare Other | Admitting: Pain Medicine

## 2018-02-04 VITALS — BP 169/94 | HR 53 | Temp 98.0°F | Resp 20 | Wt 185.0 lb

## 2018-02-04 DIAGNOSIS — Z79899 Other long term (current) drug therapy: Secondary | ICD-10-CM | POA: Insufficient documentation

## 2018-02-04 DIAGNOSIS — M5414 Radiculopathy, thoracic region: Secondary | ICD-10-CM | POA: Diagnosis not present

## 2018-02-04 DIAGNOSIS — M5134 Other intervertebral disc degeneration, thoracic region: Secondary | ICD-10-CM

## 2018-02-04 DIAGNOSIS — G8929 Other chronic pain: Secondary | ICD-10-CM | POA: Diagnosis not present

## 2018-02-04 DIAGNOSIS — R1012 Left upper quadrant pain: Secondary | ICD-10-CM | POA: Insufficient documentation

## 2018-02-04 DIAGNOSIS — Z888 Allergy status to other drugs, medicaments and biological substances status: Secondary | ICD-10-CM | POA: Diagnosis not present

## 2018-02-04 DIAGNOSIS — Z886 Allergy status to analgesic agent status: Secondary | ICD-10-CM | POA: Diagnosis not present

## 2018-02-04 DIAGNOSIS — Z9889 Other specified postprocedural states: Secondary | ICD-10-CM | POA: Diagnosis not present

## 2018-02-04 DIAGNOSIS — M549 Dorsalgia, unspecified: Secondary | ICD-10-CM | POA: Diagnosis present

## 2018-02-04 DIAGNOSIS — M792 Neuralgia and neuritis, unspecified: Secondary | ICD-10-CM

## 2018-02-04 MED ORDER — ROPIVACAINE HCL 2 MG/ML IJ SOLN
2.0000 mL | Freq: Once | INTRAMUSCULAR | Status: AC
  Administered 2018-02-04: 2 mL via EPIDURAL
  Filled 2018-02-04: qty 10

## 2018-02-04 MED ORDER — LACTATED RINGERS IV SOLN: 1000.0000 mL | Freq: Once | INTRAVENOUS | Status: DC

## 2018-02-04 MED ORDER — IOPAMIDOL (ISOVUE-M 200) INJECTION 41%
10.0000 mL | Freq: Once | INTRAMUSCULAR | Status: AC
  Administered 2018-02-04: 10 mL via EPIDURAL
  Filled 2018-02-04: qty 10

## 2018-02-04 MED ORDER — GABAPENTIN 100 MG PO CAPS: 100.0000 mg | ORAL_CAPSULE | Freq: Four times a day (QID) | ORAL | 0 refills | Status: DC

## 2018-02-04 MED ORDER — FENTANYL CITRATE (PF) 100 MCG/2ML IJ SOLN: 25.0000 ug | INTRAMUSCULAR | Status: DC | PRN

## 2018-02-04 MED ORDER — SODIUM CHLORIDE 0.9% FLUSH
2.0000 mL | Freq: Once | INTRAVENOUS | Status: AC
  Administered 2018-02-04: 2 mL

## 2018-02-04 MED ORDER — LIDOCAINE HCL 2 % IJ SOLN
20.0000 mL | Freq: Once | INTRAMUSCULAR | Status: AC
  Administered 2018-02-04: 400 mg
  Filled 2018-02-04: qty 40

## 2018-02-04 MED ORDER — MIDAZOLAM HCL 5 MG/5ML IJ SOLN: 1.0000 mg | INTRAMUSCULAR | Status: DC | PRN

## 2018-02-04 MED ORDER — DEXAMETHASONE SODIUM PHOSPHATE 10 MG/ML IJ SOLN
10.0000 mg | Freq: Once | INTRAMUSCULAR | Status: AC
  Administered 2018-02-04: 10 mg
  Filled 2018-02-04: qty 1

## 2018-02-04 NOTE — Patient Instructions (Addendum)
____________________________________________________________________________________________  Post-Procedure Discharge Instructions  Instructions:  Apply ice: Fill a plastic sandwich bag with crushed ice. Cover it with a small towel and apply to injection site. Apply for 15 minutes then remove x 15 minutes. Repeat sequence on day of procedure, until you go to bed. The purpose is to minimize swelling and discomfort after procedure.  Apply heat: Apply heat to procedure site starting the day following the procedure. The purpose is to treat any soreness and discomfort from the procedure.  Food intake: Start with clear liquids (like water) and advance to regular food, as tolerated.   Physical activities: Keep activities to a minimum for the first 8 hours after the procedure.   Driving: If you have received any sedation, you are not allowed to drive for 24 hours after your procedure.  Blood thinner: Restart your blood thinner 6 hours after your procedure. (Only for those taking blood thinners)  Insulin: As soon as you can eat, you may resume your normal dosing schedule. (Only for those taking insulin)  Infection prevention: Keep procedure site clean and dry.  Post-procedure Pain Diary: Extremely important that this be done correctly and accurately. Recorded information will be used to determine the next step in treatment.  Pain evaluated is that of treated area only. Do not include pain from an untreated area.  Complete every hour, on the hour, for the initial 8 hours. Set an alarm to help you do this part accurately.  Do not go to sleep and have it completed later. It will not be accurate.  Follow-up appointment: Keep your follow-up appointment after the procedure. Usually 2 weeks for most procedures. (6 weeks in the case of radiofrequency.) Bring you pain diary.   Expect:  From numbing medicine (AKA: Local Anesthetics): Numbness or decrease in pain.  Onset: Full effect within 15  minutes of injected.  Duration: It will depend on the type of local anesthetic used. On the average, 1 to 8 hours.   From steroids: Decrease in swelling or inflammation. Once inflammation is improved, relief of the pain will follow.  Onset of benefits: Depends on the amount of swelling present. The more swelling, the longer it will take for the benefits to be seen. In some cases, up to 10 days.  Duration: Steroids will stay in the system x 2 weeks. Duration of benefits will depend on multiple posibilities including persistent irritating factors.  From procedure: Some discomfort is to be expected once the numbing medicine wears off. This should be minimal if ice and heat are applied as instructed.  Call if:  You experience numbness and weakness that gets worse with time, as opposed to wearing off.  New onset bowel or bladder incontinence. (This applies to Spinal procedures only)  Emergency Numbers:  Durning business hours (Monday - Thursday, 8:00 AM - 4:00 PM) (Friday, 9:00 AM - 12:00 Noon): [phone]  After hours: [phone] ____________________________________________________________________________________________   ____________________________________________________________________________________________  Gabapentin Titration  Medication used: Gabapentin (Generic Name) or Neurontin (Brand Name) 100 mg tablets/capsules  Reasons to stop increasing the dose:  Reason 1: You get good relief of symptoms, in which case there is no need to increase the daily dose any further.    Reason 2: You develop some side effects, such as sleeping all of the time, difficulty concentrating, or becoming disoriented, in which case you need to go down on the dose, to the prior level, where you were not experiencing any side effects. Stay on that dose longer, to allow  more time for your body to get use it, before attempting to increase it again.   Steps to increase medication: Step 1:  Start by taking 1 (one) tablet at bedtime x 7 (seven) days.  Step 2: Increase dose to 2 (two) tablets at bedtime. Stay on this dose x 7 (seven) days.  Step 3: Next increase it to 3 (three) tablets at bedtime. Stay on this dose x another 7 (seven) days.  Step 4: Next, add 1 (one) tablet at noon with lunch. Continue this dose x another 7 (seven) days.  Step 5: Add 1 (one) tablet in the afternoon with dinner. Stay on this dose x another 7 (seven) days.  Step 6: At this point you should be taking the medicine 4 (four) times a day. This daily regimen of taking the medicine 4 (four) times a day, will be maintained from now on. You should not take any doses any sooner than every 6 (six) hours.  Step 7: After 7 (seven) days of taking 3 (three) tablet at bedtime, 1 (one) tablet at noon, 1 (one) tablet in the afternoon, and 1 (one) tablet in the morning, begin taking 2 (two) tablets at noon with lunch. Stay on this dose x another 7 (seven) days.   Step 8: After 7 (seven) days of taking 3 (three) tablet at bedtime, 2 (two) tablets at noon, 1 (one) tablet in the afternoon, and 1 (one) tablet in the morning, begin taking 2 (two) tablets in the afternoon with dinner. Stay on this dose x another 7 (seven) days.   Step 9: After 7 (seven) days of taking 3 (three) tablet at bedtime, 2 (two) tablets at noon, 2 (two) tablets in the afternoon, and 1 (one) tablet in the morning, begin taking 2 (two) tablets in the morning with breakfast. Stay on this dose x another 7 (seven) days. At this point you should be taking the medicine 4 (four) times a day, or about every 6 (six) hours. This daily regimen of taking the medicine 4 (four) times a day, will be maintained from now on. You should not take any doses any sooner than every 6 (six) hours.  Step 10: After 7 (seven) days of taking 3 (three) tablet at bedtime, 2 (two) tablets at noon, 2 (two) tablets in the afternoon, and 2 (two) tablets in the morning, begin taking 3  (three) tablets at noon with lunch. Stay on this dose x another 7 (seven) days.   Step 11: After 7 (seven) days of taking 3 (three) tablet at bedtime, 3 (three) tablets at noon, 2 (two) tablets in the afternoon, and 2 (two) tablets in the morning, begin taking 3 (three) tablets in the afternoon with dinner. Stay on this dose x another 7 (seven) days.   Step 12: After 7 (seven) days of taking 3 (three) tablet at bedtime, 3 (three) tablets at noon, 3 (three) tablets in the afternoon, and 2 (two) tablet in the morning, begin taking 3 (three) tablets in the morning with breakfast. Stay on this dose x another 7 (seven) days. At this point you should be taking the medicine 4 (four) times a day, or about every 6 (six) hours. This daily regimen of taking the medicine 4 (four) times a day, will be maintained from now on.   Endpoint: Once you have reached the maximum dose you can tolerate without side-effects, contact your physician so as to evaluate the results of the regimen.   Questions: Feel free to contact us for any  questions or problems at [phone] ____________________________________________________________________________________________ Pain Management Discharge Instructions  General Discharge Instructions :  If you need to reach your doctor call: Monday-Friday 8:00 am - 4:00 pm at [phone] or toll free [phone].  After clinic hours [phone] to have operator reach doctor.  Bring all of your medication bottles to all your appointments in the pain clinic.  To cancel or reschedule your appointment with Pain Management please remember to call 24 hours in advance to avoid a fee.  Refer to the educational materials which you have been given on: General Risks, I had my Procedure. Discharge Instructions, Post Sedation.  Post Procedure Instructions:  The drugs you were given will stay in your system until tomorrow, so for the next 24 hours you should not drive, make any legal  decisions or drink any alcoholic beverages.  You may eat anything you prefer, but it is better to start with liquids then soups and crackers, and gradually work up to solid foods.  Please notify your doctor immediately if you have any unusual bleeding, trouble breathing or pain that is not related to your normal pain.  Depending on the type of procedure that was done, some parts of your body may feel week and/or numb.  This usually clears up by tonight or the next day.  Walk with the use of an assistive device or accompanied by an adult for the 24 hours.  You may use ice on the affected area for the first 24 hours.  Put ice in a Ziploc bag and cover with a towel and place against area 15 minutes on 15 minutes off.  You may switch to heat after 24 hours.Epidural Steroid Injection Patient Information  Description: The epidural space surrounds the nerves as they exit the spinal cord.  In some patients, the nerves can be compressed and inflamed by a bulging disc or a tight spinal canal (spinal stenosis).  By injecting steroids into the epidural space, we can bring irritated nerves into direct contact with a potentially helpful medication.  These steroids act directly on the irritated nerves and can reduce swelling and inflammation which often leads to decreased pain.  Epidural steroids may be injected anywhere along the spine and from the neck to the low back depending upon the location of your pain.   After numbing the skin with local anesthetic (like Novocaine), a small needle is passed into the epidural space slowly.  You may experience a sensation of pressure while this is being done.  The entire block usually last less than 10 minutes.  Conditions which may be treated by epidural steroids:   Low back and leg pain  Neck and arm pain  Spinal stenosis  Post-laminectomy syndrome  Herpes zoster (shingles) pain  Pain from compression fractures  Preparation for the injection:  1. Do not  eat any solid food or dairy products within 8 hours of your appointment.  2. You may drink clear liquids up to 3 hours before appointment.  Clear liquids include water, black coffee, juice or soda.  No milk or cream please. 3. You may take your regular medication, including pain medications, with a sip of water before your appointment  Diabetics should hold regular insulin (if taken separately) and take 1/2 normal NPH dos the morning of the procedure.  Carry some sugar containing items with you to your appointment. 4. A driver must accompany you and be prepared to drive you home after your procedure.  5. Bring all your current medications with  your. 6. An IV may be inserted and sedation may be given at the discretion of the physician.   7. A blood pressure cuff, EKG and other monitors will often be applied during the procedure.  Some patients may need to have extra oxygen administered for a short period. 8. You will be asked to provide medical information, including your allergies, prior to the procedure.  We must know immediately if you are taking blood thinners (like Coumadin/Warfarin)  Or if you are allergic to IV iodine contrast (dye). We must know if you could possible be pregnant.  Possible side-effects:  Bleeding from needle site  Infection (rare, may require surgery)  Nerve injury (rare)  Numbness & tingling (temporary)  Difficulty urinating (rare, temporary)  Spinal headache ( a headache worse with upright posture)  Light -headedness (temporary)  Pain at injection site (several days)  Decreased blood pressure (temporary)  Weakness in arm/leg (temporary)  Pressure sensation in back/neck (temporary)  Call if you experience:  Fever/chills associated with headache or increased back/neck pain.  Headache worsened by an upright position.  New onset weakness or numbness of an extremity below the injection site  Hives or difficulty breathing (go to the emergency  room)  Inflammation or drainage at the infection site  Severe back/neck pain  Any new symptoms which are concerning to you  Please note:  Although the local anesthetic injected can often make your back or neck feel good for several hours after the injection, the pain will likely return.  It takes 3-7 days for steroids to work in the epidural space.  You may not notice any pain relief for at least that one week.  If effective, we will often do a series of three injections spaced 3-6 weeks apart to maximally decrease your pain.  After the initial series, we generally will wait several months before considering a repeat injection of the same type.  If you have any questions, please call [phone] [facility]

## 2018-02-04 NOTE — Progress Notes (Signed)
Patient's Name: [name]  MRN: [id number]  Referring Provider: [name], MD  DOB: 10-16-1945  PCP: [name], MD  DOS: 02/04/2018  Note by: Oswaldo Done, MD  Service setting: Ambulatory outpatient  Specialty: Interventional Pain Management  Patient type: Established  Location: [facility] (AMB) Pain Management Facility  Visit type: Interventional Procedure   Primary Reason for Visit: Interventional Pain Management Treatment. CC: Back Pain (mid)  Procedure:          Anesthesia, Analgesia, Anxiolysis:  Type: Diagnostic Inter-Laminar Thoracic Epidural Block #1  Region: Posterior Thoracolumbar Level: T4-5 Laterality: Left-Sided Paraspinal  Type: Local Anesthesia Indication(s): Analgesia         Route: Infiltration ([place]/IM) IV Access: Declined Sedation: Declined  Local Anesthetic: Lidocaine 1-2%   Indications: 1. DDD (degenerative disc disease), thoracic   2. Thoracic radiculitis (Left)   3. Chronic upper quadrant pain (LUQ) (Left)    Pain Score: Pre-procedure: 4 /10 Post-procedure: 0-No pain/10  Pre-op Assessment:  [name] is a 72 y.o. (year old), male patient, seen today for interventional treatment. He  has a past surgical history that includes Knee Arthroplasty (Left, 2006); Knee Arthroplasty (Right, 12/10); Implantation of dual-chamber ICD.; Cardiac defibrillator removal (2014); Biv icd genertaor change out (N/A, 10/08/2012); Cardiac catheterization; Cataract extraction; Cataract extraction w/PHACO (Left, 03/12/2016); and Tonsillectomy. [name] has a current medication list which includes the following prescription(s): amiodarone, atorvastatin, carvedilol, cyanocobalamin, furosemide, gabapentin, hydralazine, lido-capsaicin-men-methyl sal, oxygen-helium, pantoprazole, potassium chloride, and isosorbide mononitrate. His primarily concern today is the Back Pain (mid)  Initial Vital Signs:  Pulse/HCG Rate: (!) 53ECG Heart Rate: 61 Temp: 98 F (36.7 C) Resp:  15 BP: (!) 151/83 SpO2: 97 %  BMI: Estimated body mass index is 27.32 kg/m as calculated from the following:   Height as of 01/21/18: 5\' 9"  (1.753 m).   Weight as of this encounter: 185 lb (83.9 kg).  Risk Assessment: Allergies: Reviewed. He is allergic to ace inhibitors; aspirin; indocin [indomethacin]; mobic [meloxicam]; and nsaids.  Allergy Precautions: None required Coagulopathies: Reviewed. None identified.  Blood-thinner therapy: None at this time Active Infection(s): Reviewed. None identified. [name] is afebrile  Site Confirmation: [name] was asked to confirm the procedure and laterality before marking the site Procedure checklist: Completed Consent: Before the procedure and under the influence of no sedative(s), amnesic(s), or anxiolytics, the patient was informed of the treatment options, risks and possible complications. To fulfill our ethical and legal obligations, as recommended by the American Medical Association's Code of Ethics, I have informed the patient of my clinical impression; the nature and purpose of the treatment or procedure; the risks, benefits, and possible complications of the intervention; the alternatives, including doing nothing; the risk(s) and benefit(s) of the alternative treatment(s) or procedure(s); and the risk(s) and benefit(s) of doing nothing. The patient was provided information about the general risks and possible complications associated with the procedure. These may include, but are not limited to: failure to achieve desired goals, infection, bleeding, organ or nerve damage, allergic reactions, paralysis, and death. In addition, the patient was informed of those risks and complications associated to Spine-related procedures, such as failure to decrease pain; infection (i.e.: Meningitis, epidural or intraspinal abscess); bleeding (i.e.: epidural hematoma, subarachnoid hemorrhage, or any other type of intraspinal or peri-dural bleeding); organ  or nerve damage (i.e.: Any type of peripheral nerve, nerve root, or spinal cord injury) with subsequent damage to sensory, motor, and/or autonomic systems, resulting in permanent pain, numbness, and/or weakness of  one or several areas of the body; allergic reactions; (i.e.: anaphylactic reaction); and/or death. Furthermore, the patient was informed of those risks and complications associated with the medications. These include, but are not limited to: allergic reactions (i.e.: anaphylactic or anaphylactoid reaction(s)); adrenal axis suppression; blood sugar elevation that in diabetics may result in ketoacidosis or comma; water retention that in patients with history of congestive heart failure may result in shortness of breath, pulmonary edema, and decompensation with resultant heart failure; weight gain; swelling or edema; medication-induced neural toxicity; particulate matter embolism and blood vessel occlusion with resultant organ, and/or nervous system infarction; and/or aseptic necrosis of one or more joints. Finally, the patient was informed that Medicine is not an exact science; therefore, there is also the possibility of unforeseen or unpredictable risks and/or possible complications that may result in a catastrophic outcome. The patient indicated having understood very clearly. We have given the patient no guarantees and we have made no promises. Enough time was given to the patient to ask questions, all of which were answered to the patient's satisfaction. [name] has indicated that he wanted to continue with the procedure. Attestation: I, the ordering provider, attest that I have discussed with the patient the benefits, risks, side-effects, alternatives, likelihood of achieving goals, and potential problems during recovery for the procedure that I have provided informed consent. Date  Time: 02/04/2018 12:44 PM  Pre-Procedure Preparation:  Monitoring: As per clinic protocol. Respiration, ETCO2,  SpO2, BP, heart rate and rhythm monitor placed and checked for adequate function Safety Precautions: Patient was assessed for positional comfort and pressure points before starting the procedure. Time-out: I initiated and conducted the "Time-out" before starting the procedure, as per protocol. The patient was asked to participate by confirming the accuracy of the "Time Out" information. Verification of the correct person, site, and procedure were performed and confirmed by me, the nursing staff, and the patient. "Time-out" conducted as per Joint Commission's Universal Protocol (UP.01.01.01). Time: 1319  Description of Procedure:          Position: Prone Target Area: For Epidural Steroid injection(s), the target area is the  interlaminar space, initially targeting the lower border of the superior vertebral body lamina. Approach: Interlaminar approach. Area Prepped: Entire Posterior Thoracolumbar Region Prepping solution: ChloraPrep (2% chlorhexidine gluconate and 70% isopropyl alcohol) Safety Precautions: Aspiration looking for blood return was conducted prior to all injections. At no point did we inject any substances, as a needle was being advanced. No attempts were made at seeking any paresthesias. Safe injection practices and needle disposal techniques used. Medications properly checked for expiration dates. SDV (single dose vial) medications used. Description of the Procedure: Protocol guidelines were followed. The patient was placed in position over the fluoroscopy table. The target area was identified and the area prepped in the usual manner. Skin & deeper tissues infiltrated with local anesthetic. Appropriate amount of time allowed to pass for local anesthetics to take effect. The procedure needles were then advanced to the target area. The inferior aspect of the superior lamina was contacted and the needle walked caudad, until the lamina was cleared. The epidural space was identified using  "loss-of-resistance technique" with 0.9% PF-NSS (2-28mL), in a low friction 10cc LOR glass syringe. Proper needle placement was secured. Negative aspiration confirmed. Solution injected in intermittent fashion, asking for systemic symptoms every 0.5 cc of injectate. The needles were then removed and the area cleansed, making sure to leave some of the prepping solution behind to take advantage of its  long term bactericidal properties. Vitals:   02/04/18 1320 02/04/18 1325 02/04/18 1330 02/04/18 1333  BP: (!) 170/89 (!) 179/94 (!) 172/93 (!) 169/94  Pulse:      Resp: 19 (!) 23 (!) 26 20  Temp:      SpO2: 97% 96% 94% 95%  Weight:        Start Time: 1319 hrs. End Time: 1332 hrs. Materials:  Needle(s) Type: Epidural needle Gauge: 17G Length: 3.5-in Medication(s): Please see orders for medications and dosing details.  Imaging Guidance (Spinal):          Type of Imaging Technique: Fluoroscopy Guidance (Spinal) Indication(s): Assistance in needle guidance and placement for procedures requiring needle placement in or near specific anatomical locations not easily accessible without such assistance. Exposure Time: Please see nurses notes. Contrast: Before injecting any contrast, we confirmed that the patient did not have an allergy to iodine, shellfish, or radiological contrast. Once satisfactory needle placement was completed at the desired level, radiological contrast was injected. Contrast injected under live fluoroscopy. No contrast complications. See chart for type and volume of contrast used. Fluoroscopic Guidance: I was personally present during the use of fluoroscopy. "Tunnel Vision Technique" used to obtain the best possible view of the target area. Parallax error corrected before commencing the procedure. "Direction-depth-direction" technique used to introduce the needle under continuous pulsed fluoroscopy. Once target was reached, antero-posterior, oblique, and lateral fluoroscopic projection  used confirm needle placement in all planes. Images permanently stored in EMR. Interpretation: I personally interpreted the imaging intraoperatively. Adequate needle placement confirmed in multiple planes. Appropriate spread of contrast into desired area was observed. No evidence of afferent or efferent intravascular uptake. No intrathecal or subarachnoid spread observed. Permanent images saved into the patient's record.  Antibiotic Prophylaxis:   Anti-infectives (From admission, onward)   None     Indication(s): None identified  Post-operative Assessment:  Post-procedure Vital Signs:  Pulse/HCG Rate: (!) 5366 Temp: 98 F (36.7 C) Resp: 20 BP: (!) 169/94 SpO2: 95 %  EBL: None  Complications: No immediate post-treatment complications observed by team, or reported by patient.  Note: The patient tolerated the entire procedure well. A repeat set of vitals were taken after the procedure and the patient was kept under observation following institutional policy, for this type of procedure. Post-procedural neurological assessment was performed, showing return to baseline, prior to discharge. The patient was provided with post-procedure discharge instructions, including a section on how to identify potential problems. Should any problems arise concerning this procedure, the patient was given instructions to immediately contact us, at any time, without hesitation. In any case, we plan to contact the patient by telephone for a follow-up status report regarding this interventional procedure.  Comments:  No additional relevant information.  Plan of Care    Imaging Orders     DG C-Arm 1-60 Min-No Report  Procedure Orders     Thoracic Epidural Injection  Medications ordered for procedure: Meds ordered this encounter  Medications  . iopamidol (ISOVUE-M) 41 % intrathecal injection 10 mL    Must be Myelogram-compatible. If not available, you may substitute with a water-soluble, non-ionic,  hypoallergenic, myelogram-compatible radiological contrast medium.  Marland Kitchen lidocaine (XYLOCAINE) 2 % (with pres) injection 400 mg  . DISCONTD: midazolam (VERSED) 5 MG/5ML injection 1-2 mg    Make sure Flumazenil is available in the pyxis when using this medication. If oversedation occurs, administer 0.2 mg IV over 15 sec. If after 45 sec no response, administer 0.2 mg again over 1 min;  may repeat at 1 min intervals; not to exceed 4 doses (1 mg)  . DISCONTD: fentaNYL (SUBLIMAZE) injection 25-50 mcg    Make sure Narcan is available in the pyxis when using this medication. In the event of respiratory depression (RR< 8/min): Titrate NARCAN (naloxone) in increments of 0.1 to 0.2 mg IV at 2-3 minute intervals, until desired degree of reversal.  . DISCONTD: lactated ringers infusion 1,000 mL  . sodium chloride flush (NS) 0.9 % injection 2 mL  . ropivacaine (PF) 2 mg/mL (0.2%) (NAROPIN) injection 2 mL  . dexamethasone (DECADRON) injection 10 mg  . gabapentin (NEURONTIN) 100 MG capsule    Sig: Take 1-3 capsules (100-300 mg total) by mouth 4 (four) times daily. Follow written titration schedule.    Dispense:  360 capsule    Refill:  0    Do not place medication on "Automatic Refill". Fill one day early if pharmacy is closed on scheduled refill date.   Medications administered: We administered iopamidol, lidocaine, sodium chloride flush, ropivacaine (PF) 2 mg/mL (0.2%), and dexamethasone.  See the medical record for exact dosing, route, and time of administration.  New Prescriptions   No medications on file   Disposition: Discharge home  Discharge Date & Time: 02/04/2018; 1345 hrs.   Physician-requested Follow-up: Return for post-procedure eval (2 wks), w/ Dr. Laban Emperor.  Future Appointments  Date Time Provider Department Center  02/24/2018  8:30 AM Delano Metz, MD [facility] None   Primary Care Physician: [name], MD Location: [facility] Note by: Oswaldo Done, MD Date: 02/04/2018; Time: 1:57 PM  Disclaimer:  Medicine is not an Visual merchandiser. The only guarantee in medicine is that nothing is guaranteed. It is important to note that the decision to proceed with this intervention was based on the information collected from the patient. The Data and conclusions were drawn from the patient's questionnaire, the interview, and the physical examination. Because the information was provided in large part by the patient, it cannot be guaranteed that it has not been purposely or unconsciously manipulated. Every effort has been made to obtain as much relevant data as possible for this evaluation. It is important to note that the conclusions that lead to this procedure are derived in large part from the available data. Always take into account that the treatment will also be dependent on availability of resources and existing treatment guidelines, considered by other Pain Management Practitioners as being common knowledge and practice, at the time of the intervention. For Medico-Legal purposes, it is also important to point out that variation in procedural techniques and pharmacological choices are the acceptable norm. The indications, contraindications, technique, and results of the above procedure should only be interpreted and judged by a Board-Certified Interventional Pain Specialist with extensive familiarity and expertise in the same exact procedure and technique.

## 2018-02-05 ENCOUNTER — Telehealth: Payer: Self-pay | Admitting: *Deleted

## 2018-02-05 NOTE — Telephone Encounter (Signed)
Attempted to call for post procedure follow-up. No answer, unable to leave a message. 

## 2018-02-15 ENCOUNTER — Other Ambulatory Visit: Payer: Self-pay | Admitting: Cardiology

## 2018-02-16 ENCOUNTER — Other Ambulatory Visit: Payer: Self-pay | Admitting: Pain Medicine

## 2018-02-16 ENCOUNTER — Other Ambulatory Visit: Payer: Self-pay | Admitting: Cardiology

## 2018-02-16 DIAGNOSIS — M792 Neuralgia and neuritis, unspecified: Secondary | ICD-10-CM

## 2018-02-16 DIAGNOSIS — E785 Hyperlipidemia, unspecified: Secondary | ICD-10-CM

## 2018-02-24 ENCOUNTER — Ambulatory Visit: Payer: Medicare Other | Attending: Pain Medicine | Admitting: Pain Medicine

## 2018-02-24 ENCOUNTER — Other Ambulatory Visit: Payer: Self-pay

## 2018-02-24 ENCOUNTER — Encounter: Payer: Self-pay | Admitting: Pain Medicine

## 2018-02-24 VITALS — BP 126/68 | HR 57 | Temp 97.9°F | Ht 69.0 in | Wt 195.0 lb

## 2018-02-24 DIAGNOSIS — K219 Gastro-esophageal reflux disease without esophagitis: Secondary | ICD-10-CM | POA: Insufficient documentation

## 2018-02-24 DIAGNOSIS — Z87891 Personal history of nicotine dependence: Secondary | ICD-10-CM | POA: Insufficient documentation

## 2018-02-24 DIAGNOSIS — M792 Neuralgia and neuritis, unspecified: Secondary | ICD-10-CM | POA: Diagnosis not present

## 2018-02-24 DIAGNOSIS — E782 Mixed hyperlipidemia: Secondary | ICD-10-CM | POA: Diagnosis not present

## 2018-02-24 DIAGNOSIS — I130 Hypertensive heart and chronic kidney disease with heart failure and stage 1 through stage 4 chronic kidney disease, or unspecified chronic kidney disease: Secondary | ICD-10-CM | POA: Diagnosis not present

## 2018-02-24 DIAGNOSIS — Z95810 Presence of automatic (implantable) cardiac defibrillator: Secondary | ICD-10-CM | POA: Diagnosis not present

## 2018-02-24 DIAGNOSIS — M546 Pain in thoracic spine: Secondary | ICD-10-CM

## 2018-02-24 DIAGNOSIS — M5414 Radiculopathy, thoracic region: Secondary | ICD-10-CM | POA: Diagnosis not present

## 2018-02-24 DIAGNOSIS — Z96653 Presence of artificial knee joint, bilateral: Secondary | ICD-10-CM | POA: Insufficient documentation

## 2018-02-24 DIAGNOSIS — Z888 Allergy status to other drugs, medicaments and biological substances status: Secondary | ICD-10-CM | POA: Insufficient documentation

## 2018-02-24 DIAGNOSIS — G8929 Other chronic pain: Secondary | ICD-10-CM

## 2018-02-24 DIAGNOSIS — M5134 Other intervertebral disc degeneration, thoracic region: Secondary | ICD-10-CM | POA: Diagnosis not present

## 2018-02-24 DIAGNOSIS — G894 Chronic pain syndrome: Secondary | ICD-10-CM | POA: Insufficient documentation

## 2018-02-24 DIAGNOSIS — Z79899 Other long term (current) drug therapy: Secondary | ICD-10-CM | POA: Insufficient documentation

## 2018-02-24 DIAGNOSIS — M5114 Intervertebral disc disorders with radiculopathy, thoracic region: Secondary | ICD-10-CM | POA: Diagnosis not present

## 2018-02-24 DIAGNOSIS — Z886 Allergy status to analgesic agent status: Secondary | ICD-10-CM | POA: Diagnosis not present

## 2018-02-24 DIAGNOSIS — R1012 Left upper quadrant pain: Secondary | ICD-10-CM | POA: Insufficient documentation

## 2018-02-24 DIAGNOSIS — R0789 Other chest pain: Secondary | ICD-10-CM | POA: Insufficient documentation

## 2018-02-24 DIAGNOSIS — J45909 Unspecified asthma, uncomplicated: Secondary | ICD-10-CM | POA: Diagnosis not present

## 2018-02-24 DIAGNOSIS — N183 Chronic kidney disease, stage 3 (moderate): Secondary | ICD-10-CM | POA: Diagnosis not present

## 2018-02-24 DIAGNOSIS — I5022 Chronic systolic (congestive) heart failure: Secondary | ICD-10-CM | POA: Diagnosis not present

## 2018-02-24 MED ORDER — GABAPENTIN 100 MG PO CAPS: ORAL_CAPSULE | ORAL | 5 refills | Status: DC

## 2018-02-24 NOTE — Progress Notes (Signed)
Patient's Name: [name]  MRN: [id number]  Referring Provider: Rusty Aus, MD  DOB: May 23, 1946  PCP: Rusty Aus, MD  DOS: 02/24/2018  Note by: Gaspar Cola, MD  Service setting: Ambulatory outpatient  Specialty: Interventional Pain Management  Location: [facility] (AMB) Pain Management Facility    Patient type: Established   Primary Reason(s) for Visit: Encounter for post-procedure evaluation of chronic illness with mild to moderate exacerbation CC: Back Pain (left side)  HPI  [name] is a 72 y.o. year old, male patient, who comes today for a post-procedure evaluation. He has Combined hyperlipidemia; RHEUMATIC FEVER; Benign essential hypertension; Cardiomyopathy, nonischemic ([place]); Asymptomatic PVCs; LUNG NODULE; Other osteoporosis; DIZZINESS; Nonspecific (abnormal) findings on radiological and other examination of body structure; Nonspecific abnormal findings on radiological and other examination of skull and head; Chronic systolic heart failure ([place]); Automatic implantable cardioverter-defibrillator in situ; Bruit; Abdominal pain (Left); Asthma; B12 deficiency; CKD (chronic kidney disease) stage 3, GFR 30-59 ml/min ([place]); Medicare annual wellness visit, initial; Positive Murphy's Sign; Chronic pain syndrome; Pharmacologic therapy; Disorder of skeletal system; Problems influencing health status; Chronic chest wall pain (Primary Area of Pain) (Left); Chronic thoracic back pain (Secondary Area of Pain) (Left); Thoracic Facet hypertrophy (Bilateral); Thoracic facet syndrome (Bilateral); DDD (degenerative disc disease), thoracic; Thoracic radiculitis (Left); Chronic upper back pain (Left); Aortic atherosclerosis ([place]); Lumbar facet arthropathy; Spondylosis without myelopathy or radiculopathy, thoracic region; Spondylosis without myelopathy or radiculopathy, lumbosacral region; History of rib fracture; Diverticulosis, sigmoid; Cholelithiasis; Thoracic ascending aortic aneurysm (HCC) (4.2  cm); Hx of total knee replacement (Right); Chronic upper quadrant pain (LUQ) (Left); and Neurogenic pain on their problem list. His primarily concern today is the Back Pain (left side)  Pain Assessment: Location: Left Back Radiating: Pain radiates from left side to lower back Onset: More than a month ago Duration: Chronic pain Quality: Nagging Severity: 1 /10 (subjective, self-reported pain score)  Note: Reported level is compatible with observation.                               Timing: Intermittent Modifying factors: The gabapentin  three times a day has make a great improve in my pain management BP: 126/68  HR: (!) 1  [name] comes in today for post-procedure evaluation after the treatment done on 02/16/2018. The patient returns to the clinics today indicating that he is thoracic radicular symptoms are completely gone. He continues to have some pain in the upper back, but this has been improving significantly with the use of the gabapentin. At this time, he will like to continue on the gabapentin and hold on any further injections. We have given him the option of when necessary thoracic epidural steroid injections, should his pain flare up again. Today I have provided the patient with a refill on his gabapentin that should last him approximately 7 months. He still has enough for 1 month and I given refill for 6. Currently he is using 1 tablet 2 times a day and 3 tablets at bedtime.  Further details on both, my assessment(s), as well as the proposed treatment plan, please see below.  Post-Procedure Assessment  02/16/2018 Procedure: Diagnostic Left T4-5 thoracic ESI #1 under fluoro, no sedation Pre-procedure pain score:  4/10 Post-procedure pain score: 0/10 (100% relief) Influential Factors: BMI: 28.80 kg/m Intra-procedural challenges: None observed.         Assessment challenges: None detected.  Reported side-effects: None.        Post-procedural adverse reactions or  complications: None reported         Sedation: No sedation used. When no sedatives are used, the analgesic levels obtained are directly associated to the effectiveness of the local anesthetics. However, when sedation is provided, the level of analgesia obtained during the initial 1 hour following the intervention, is believed to be the result of a combination of factors. These factors may include, but are not limited to: 1. The effectiveness of the local anesthetics used. 2. The effects of the analgesic(s) and/or anxiolytic(s) used. 3. The degree of discomfort experienced by the patient at the time of the procedure. 4. The patients ability and reliability in recalling and recording the events. 5. The presence and influence of possible secondary gains and/or psychosocial factors. Reported result: Relief experienced during the 1st hour after the procedure: 90 % (Ultra-Short Term Relief)            Interpretative annotation: Clinically appropriate result. No IV Analgesic or Anxiolytic given, therefore benefits are completely due to Local Anesthetic effects.          Effects of local anesthetic: The analgesic effects attained during this period are directly associated to the localized infiltration of local anesthetics and therefore cary significant diagnostic value as to the etiological location, or anatomical origin, of the pain. Expected duration of relief is directly dependent on the pharmacodynamics of the local anesthetic used. Long-acting (4-6 hours) anesthetics used.  Reported result: Relief during the next 4 to 6 hour after the procedure: 70 % (Short-Term Relief)            Interpretative annotation: Clinically appropriate result. Analgesia during this period is likely to be Local Anesthetic-related.          Long-term benefit: Defined as the period of time past the expected duration of local anesthetics (1 hour for short-acting and 4-6 hours for long-acting). With the possible exception of  prolonged sympathetic blockade from the local anesthetics, benefits during this period are typically attributed to, or associated with, other factors such as analgesic sensory neuropraxia, antiinflammatory effects, or beneficial biochemical changes provided by agents other than the local anesthetics.  Reported result: Extended relief following procedure: 50 %(pt stated that he think the gabapetin helped him more than the procedure) (Long-Term Relief)            Interpretative annotation: Clinically appropriate result. Good relief. No permanent benefit expected. Inflammation plays a part in the etiology to the pain. Etiology is likely inflammatory and mechanical.  Current benefits: Defined as reported results that persistent at this point in time.   Analgesia: >50 %            Function: Mr. Magwood reports improvement in function ROM: Mr. Cudworth reports improvement in ROM Interpretative annotation: Ongoing benefit. No permanent benefit expected. Effective diagnostic intervention. Benefit could be steroid-related.  Interpretation: Results would suggest a successful diagnostic intervention.                  Plan:  Set up procedure as a PRN palliative treatment option for this patient.                Laboratory Chemistry  Inflammation Markers (CRP: Acute Phase) (ESR: Chronic Phase) Lab Results  Component Value Date   CRP 8.8 (H) 01/05/2018   ESRSEDRATE 4 01/05/2018  Renal Markers Lab Results  Component Value Date   BUN 11 01/05/2018   CREATININE 1.17 01/05/2018   BCR 9 (L) 01/05/2018   GFRAA 73 01/05/2018   GFRNONAA 63 01/05/2018                             Hepatic Markers Lab Results  Component Value Date   AST 25 01/05/2018   ALT 18 04/12/2014   ALBUMIN 3.6 01/05/2018                        Neuropathy Markers No results found for: HGBA1C, HIV                      Hematology Parameters Lab Results  Component Value Date   INR 0.90 10/08/2012    LABPROT 12.1 10/08/2012   PLT 189 03/09/2014   HGB 14.1 03/09/2014   HCT 41.8 03/09/2014                        CV Markers Lab Results  Component Value Date   BNP 86.3 09/18/2014   TROPONINI < 0.02 03/09/2014                         Note: Lab results reviewed.  Recent Diagnostic Imaging Results  DG C-Arm 1-60 Min-No Report Fluoroscopy was utilized by the requesting physician.  No radiographic  interpretation.   Complexity Note: I personally reviewed the fluoroscopic imaging of the procedure.                        Meds   Current Outpatient Medications:  .  amiodarone (PACERONE) 200 MG tablet, TAKE 1 TABLET (200 MG TOTAL) BY MOUTH DAILY., Disp: 90 tablet, Rfl: 1 .  atorvastatin (LIPITOR) 40 MG tablet, TAKE 1/2 TABLET (20 MG TOTAL) BY MOUTH DAILY., Disp: 45 tablet, Rfl: 3 .  carvedilol (COREG) 25 MG tablet, TAKE 2 TABLETS (50 MG TOTAL) BY MOUTH 2 (TWO) TIMES DAILY WITH A MEAL., Disp: 360 tablet, Rfl: 3 .  Cyanocobalamin (VITAMIN B12 SL), Place 1 tablet under the tongue daily., Disp: , Rfl:  .  furosemide (LASIX) 20 MG tablet, TAKE 2 TABLETS (40 MG TOTAL) BY MOUTH DAILY. TAKE AN EXTRA 20MG DAILY AS NEEDED, Disp: 180 tablet, Rfl: 1 .  gabapentin (NEURONTIN) 100 MG capsule, 1 cap TID and 3 cap(s) at HS. Max: 5/day, Disp: 150 capsule, Rfl: 5 .  hydrALAZINE (APRESOLINE) 50 MG tablet, TAKE 1 & 1/2 TABLET BY MOUTH 3 TIMES DAILY, Disp: 405 tablet, Rfl: 1 .  Lido-Capsaicin-Men-Methyl Sal (1ST MEDX-PATCH/ LIDOCAINE) 4-0.025-5-20 % PTCH, Apply 1 patch topically at bedtime., Disp: 30 patch, Rfl: 5 .  OXYGEN, Inhale 2L for 8 hours at night., Disp: , Rfl:  .  pantoprazole (PROTONIX) 40 MG tablet, Take 40 mg by mouth daily. , Disp: , Rfl:  .  potassium chloride (K-DUR) 10 MEQ tablet, Take 10 mEq by mouth daily., Disp: , Rfl:  .  isosorbide mononitrate (IMDUR) 30 MG 24 hr tablet, Take 1 tablet (30 mg total) by mouth daily., Disp: , Rfl:   ROS  Constitutional: Denies any fever or  chills Gastrointestinal: No reported hemesis, hematochezia, vomiting, or acute GI distress Musculoskeletal: Denies any acute onset joint swelling, redness, loss of ROM, or weakness Neurological: No reported episodes of acute onset apraxia,  aphasia, dysarthria, agnosia, amnesia, paralysis, loss of coordination, or loss of consciousness  Allergies  Mr. Crisci is allergic to ace inhibitors; aspirin; indocin [indomethacin]; mobic [meloxicam]; and nsaids.  PFSH  Drug: Mr. Luzier  reports that he does not use drugs. Alcohol:  reports that he drinks alcohol. Tobacco:  reports that he quit smoking about 47 years ago. His smoking use included cigarettes. He has a 10.00 pack-year smoking history. He has never used smokeless tobacco. Medical:  has a past medical history of Abnormal chest CT, Benign hypertension, Cardiomyopathy, CHF (congestive heart failure) ([place]), Chronic renal insufficiency, GERD (gastroesophageal reflux disease), Hives, Hyperlipidemia, IMPLANTATION OF DEFIBRILLATOR, HX OF (02/15/2009), NSVT (nonsustained ventricular tachycardia) (HCC), Osteoarthritis, Osteoarthrosis, unspecified whether generalized or localized, lower leg, Osteoporosis, Oxygen deficiency, Psoriasis, PVC (premature ventricular contraction), and Urticaria. Surgical: Mr. Krauser  has a past surgical history that includes Knee Arthroplasty (Left, 2006); Knee Arthroplasty (Right, 12/10); Implantation of dual-chamber ICD.; Cardiac defibrillator removal (2014); Biv icd genertaor change out (N/A, 10/08/2012); Cardiac catheterization; Cataract extraction; Cataract extraction w/PHACO (Left, 03/12/2016); and Tonsillectomy. Family: family history includes Cancer in his mother; Heart attack in his father; Lung cancer in his mother.  Constitutional Exam  General appearance: Well nourished, well developed, and well hydrated. In no apparent acute distress Vitals:   02/24/18 0812  BP: 126/68  Pulse: (!) 57  Temp: 97.9 F (36.6 C)   SpO2: 100%  Weight: 195 lb (88.5 kg)  Height: _0  (1.753 m)   BMI Assessment: Estimated body mass index is 28.8 kg/m as calculated from the following:   Height as of this encounter: _1  (1.753 m).   Weight as of this encounter: 195 lb (88.5 kg).  BMI interpretation table: BMI level Category Range association with higher incidence of chronic pain  <18 kg/m2 Underweight   18.5-24.9 kg/m2 Ideal body weight   25-29.9 kg/m2 Overweight Increased incidence by 20%  30-34.9 kg/m2 Obese (Class I) Increased incidence by 68%  35-39.9 kg/m2 Severe obesity (Class II) Increased incidence by 136%  >40 kg/m2 Extreme obesity (Class III) Increased incidence by 254%   Patient's current BMI Ideal Body weight  Body mass index is 28.8 kg/m. Ideal body weight: 70.7 kg (155 lb 13.8 oz) Adjusted ideal body weight: 77.8 kg (171 lb 8.3 oz)   BMI Readings from Last 4 Encounters:  02/24/18 28.80 kg/m  02/04/18 27.32 kg/m  01/21/18 27.91 kg/m  01/20/18 27.32 kg/m   Wt Readings from Last 4 Encounters:  02/24/18 195 lb (88.5 kg)  02/04/18 185 lb (83.9 kg)  01/21/18 189 lb (85.7 kg)  01/20/18 185 lb (83.9 kg)  Psych/Mental status: Alert, oriented x 3 (person, place, & time)       Eyes: PERLA Respiratory: No evidence of acute respiratory distress  Cervical Spine Area Exam  Skin & Axial Inspection: No masses, redness, edema, swelling, or associated skin lesions Alignment: Symmetrical Functional ROM: Unrestricted ROM      Stability: No instability detected Muscle Tone/Strength: Functionally intact. No obvious neuro-muscular anomalies detected. Sensory (Neurological): Unimpaired Palpation: No palpable anomalies              Upper Extremity (UE) Exam    Side: Right upper extremity  Side: Left upper extremity  Skin & Extremity Inspection: Skin color, temperature, and hair growth are WNL. No peripheral edema or cyanosis. No masses, redness, swelling, asymmetry, or associated skin lesions. No  contractures.  Skin & Extremity Inspection: Skin color, temperature, and hair growth are WNL. No peripheral edema or cyanosis.  No masses, redness, swelling, asymmetry, or associated skin lesions. No contractures.  Functional ROM: Unrestricted ROM          Functional ROM: Unrestricted ROM          Muscle Tone/Strength: Functionally intact. No obvious neuro-muscular anomalies detected.  Muscle Tone/Strength: Functionally intact. No obvious neuro-muscular anomalies detected.  Sensory (Neurological): Unimpaired          Sensory (Neurological): Unimpaired          Palpation: No palpable anomalies              Palpation: No palpable anomalies              Provocative Test(s):  Phalen's test: deferred Tinel's test: deferred Apley's scratch test (touch opposite shoulder):  Action 1 (Across chest): deferred Action 2 (Overhead): deferred Action 3 (LB reach): deferred   Provocative Test(s):  Phalen's test: deferred Tinel's test: deferred Apley's scratch test (touch opposite shoulder):  Action 1 (Across chest): deferred Action 2 (Overhead): deferred Action 3 (LB reach): deferred    Thoracic Spine Area Exam  Skin & Axial Inspection: No masses, redness, or swelling Alignment: Symmetrical Functional ROM: Improved after treatment Stability: No instability detected Muscle Tone/Strength: Functionally intact. No obvious neuro-muscular anomalies detected. Sensory (Neurological): Improved Muscle strength & Tone: No palpable anomalies  Lumbar Spine Area Exam  Skin & Axial Inspection: No masses, redness, or swelling Alignment: Symmetrical Functional ROM: Unrestricted ROM       Stability: No instability detected Muscle Tone/Strength: Functionally intact. No obvious neuro-muscular anomalies detected. Sensory (Neurological): Unimpaired Palpation: No palpable anomalies       Provocative Tests: Lumbar Hyperextension/rotation test: deferred today       Lumbar quadrant test (Kemp's test): deferred today        Lumbar Lateral bending test: deferred today       Patrick's Maneuver: deferred today                   FABER test: deferred today       Thigh-thrust test: deferred today       S-I compression test: deferred today       S-I distraction test: deferred today        Gait & Posture Assessment  Ambulation: Unassisted Gait: Relatively normal for age and body habitus Posture: WNL   Lower Extremity Exam    Side: Right lower extremity  Side: Left lower extremity  Stability: No instability observed          Stability: No instability observed          Skin & Extremity Inspection: Skin color, temperature, and hair growth are WNL. No peripheral edema or cyanosis. No masses, redness, swelling, asymmetry, or associated skin lesions. No contractures.  Skin & Extremity Inspection: Skin color, temperature, and hair growth are WNL. No peripheral edema or cyanosis. No masses, redness, swelling, asymmetry, or associated skin lesions. No contractures.  Functional ROM: Unrestricted ROM                  Functional ROM: Unrestricted ROM                  Muscle Tone/Strength: Functionally intact. No obvious neuro-muscular anomalies detected.  Muscle Tone/Strength: Functionally intact. No obvious neuro-muscular anomalies detected.  Sensory (Neurological): Unimpaired  Sensory (Neurological): Unimpaired  Palpation: No palpable anomalies  Palpation: No palpable anomalies   Assessment  Primary Diagnosis & Pertinent Problem List: The primary encounter diagnosis was Chronic chest wall pain (Primary  Area of Pain) (Left). Diagnoses of Chronic thoracic back pain (Secondary Area of Pain) (Left), Thoracic radiculitis (Left), Chronic upper quadrant pain (LUQ) (Left), DDD (degenerative disc disease), thoracic, and Neurogenic pain were also pertinent to this visit.  Status Diagnosis  Improved Improved Resolved 1. Chronic chest wall pain (Primary Area of Pain) (Left)   2. Chronic thoracic back pain (Secondary Area of Pain)  (Left)   3. Thoracic radiculitis (Left)   4. Chronic upper quadrant pain (LUQ) (Left)   5. DDD (degenerative disc disease), thoracic   6. Neurogenic pain     Problems updated and reviewed during this visit: No problems updated. Plan of Care  Pharmacotherapy (Medications Ordered): Meds ordered this encounter  Medications  . gabapentin (NEURONTIN) 100 MG capsule    Sig: 1 cap TID and 3 cap(s) at HS. Max: 5/day    Dispense:  150 capsule    Refill:  5    Do not place medication on "Automatic Refill". Fill one day early if pharmacy is closed on scheduled refill date.   Medications administered today: Shaan Rhoads. Magley had no medications administered during this visit.   Procedure Orders     Thoracic Epidural Injection Lab Orders  No laboratory test(s) ordered today   Imaging Orders  No imaging studies ordered today   Referral Orders  No referral(s) requested today    Interventional management options: Planned, scheduled, and/or pending:   Diagnostic Left T4-5 thoracic ESI #2 under fluoro, no sedation (PRN)   Considering:   Diagnostic Left thoracic ESI #2 under fluoro, no sedation  Diagnosticbilateral thoracic facet nerve block   Possiblebilateral thoracic facet RFA  Diagnostic trigger point injection  Diagnostic left sided TESI    Palliative PRN treatment(s):   Diagnostic Left T4-5 thoracic ESI #2 under fluoro, no sedation    Provider-requested follow-up: Return in about 7 months (around 09/27/2018) for Med-Mgmt, w/ Dionisio David, NP, PRN Procedure.  Future Appointments  Date Time Provider [place]  09/27/2018  8:30 AM Vevelyn Francois, NP [facility] None   Primary Care Physician: Rusty Aus, MD Location: [facility] Outpatient Pain Management Facility Note by: Gaspar Cola, MD Date: 02/24/2018; Time: 9:11 AM

## 2018-02-25 ENCOUNTER — Telehealth: Payer: Self-pay

## 2018-03-19 ENCOUNTER — Encounter: Payer: Self-pay | Admitting: Cardiology

## 2018-03-22 ENCOUNTER — Encounter: Payer: Self-pay | Admitting: Cardiology

## 2018-05-09 ENCOUNTER — Other Ambulatory Visit: Payer: Self-pay

## 2018-05-09 ENCOUNTER — Emergency Department
Admission: EM | Admit: 2018-05-09 | Discharge: 2018-05-09 | Disposition: A | Discharge status: Home / Self Care | Payer: Medicare Other | Attending: Emergency Medicine | Admitting: Emergency Medicine

## 2018-05-09 DIAGNOSIS — I110 Hypertensive heart disease with heart failure: Secondary | ICD-10-CM | POA: Insufficient documentation

## 2018-05-09 DIAGNOSIS — Z96653 Presence of artificial knee joint, bilateral: Secondary | ICD-10-CM | POA: Insufficient documentation

## 2018-05-09 DIAGNOSIS — T782XXA Anaphylactic shock, unspecified, initial encounter: Secondary | ICD-10-CM

## 2018-05-09 DIAGNOSIS — Z79899 Other long term (current) drug therapy: Secondary | ICD-10-CM | POA: Insufficient documentation

## 2018-05-09 DIAGNOSIS — I5022 Chronic systolic (congestive) heart failure: Secondary | ICD-10-CM | POA: Insufficient documentation

## 2018-05-09 DIAGNOSIS — Z87891 Personal history of nicotine dependence: Secondary | ICD-10-CM | POA: Insufficient documentation

## 2018-05-09 DIAGNOSIS — J45909 Unspecified asthma, uncomplicated: Secondary | ICD-10-CM | POA: Diagnosis not present

## 2018-05-09 DIAGNOSIS — I2510 Atherosclerotic heart disease of native coronary artery without angina pectoris: Secondary | ICD-10-CM | POA: Diagnosis not present

## 2018-05-09 DIAGNOSIS — R21 Rash and other nonspecific skin eruption: Secondary | ICD-10-CM | POA: Diagnosis present

## 2018-05-09 LAB — CBC WITH DIFFERENTIAL/PLATELET
BASOS ABS: 0.1 10*3/uL (ref 0–0.1)
BASOS PCT: 0 %
Eosinophils Absolute: 0.2 10*3/uL (ref 0–0.7)
Eosinophils Relative: 1 %
HEMATOCRIT: 46.6 % (ref 40.0–52.0)
HEMOGLOBIN: 16.4 g/dL (ref 13.0–18.0)
Lymphocytes Relative: 6 %
Lymphs Abs: 1.4 10*3/uL (ref 1.0–3.6)
MCH: 33.4 pg (ref 26.0–34.0)
MCHC: 35.3 g/dL (ref 32.0–36.0)
MCV: 94.8 fL (ref 80.0–100.0)
Monocytes Absolute: 1.3 10*3/uL — ABNORMAL HIGH (ref 0.2–1.0)
Monocytes Relative: 5 %
NEUTROS ABS: 20.1 10*3/uL — AB (ref 1.4–6.5)
NEUTROS PCT: 88 %
Platelets: 243 10*3/uL (ref 150–440)
RBC: 4.92 MIL/uL (ref 4.40–5.90)
RDW: 14.3 % (ref 11.5–14.5)
WBC: 23.1 10*3/uL — AB (ref 3.8–10.6)

## 2018-05-09 LAB — BASIC METABOLIC PANEL
ANION GAP: 9 (ref 5–15)
BUN: 15 mg/dL (ref 8–23)
CALCIUM: 7.7 mg/dL — AB (ref 8.9–10.3)
CHLORIDE: 110 mmol/L (ref 98–111)
CO2: 23 mmol/L (ref 22–32)
Creatinine, Ser: 1.66 mg/dL — ABNORMAL HIGH (ref 0.61–1.24)
GFR calc non Af Amer: 40 mL/min — ABNORMAL LOW (ref >60)
GFR, EST AFRICAN AMERICAN: 47 mL/min — AB (ref >60)
Glucose, Bld: 154 mg/dL — ABNORMAL HIGH (ref 70–99)
Potassium: 3.6 mmol/L (ref 3.5–5.1)
Sodium: 142 mmol/L (ref 135–145)

## 2018-05-09 LAB — TROPONIN I

## 2018-05-09 LAB — TSH: TSH: 5.535 u[IU]/mL — ABNORMAL HIGH (ref 0.350–4.500)

## 2018-05-09 MED ORDER — FAMOTIDINE IN NACL 20-0.9 MG/50ML-% IV SOLN
20.0000 mg | Freq: Once | INTRAVENOUS | Status: AC
  Administered 2018-05-09: 20 mg via INTRAVENOUS
  Filled 2018-05-09: qty 50

## 2018-05-09 MED ORDER — ONDANSETRON HCL 4 MG/2ML IJ SOLN
INTRAMUSCULAR | Status: AC
  Administered 2018-05-09: 4 mg via INTRAVENOUS
  Filled 2018-05-09: qty 2

## 2018-05-09 MED ORDER — ONDANSETRON HCL 4 MG/2ML IJ SOLN
4.0000 mg | Freq: Once | INTRAMUSCULAR | Status: AC
  Administered 2018-05-09: 4 mg via INTRAVENOUS

## 2018-05-09 MED ORDER — EPINEPHRINE 0.3 MG/0.3ML IJ SOAJ: 0.3000 mg | Freq: Once | INTRAMUSCULAR | 0 refills | Status: AC

## 2018-05-09 MED ORDER — SODIUM CHLORIDE 0.9 % IV BOLUS
1000.0000 mL | Freq: Once | INTRAVENOUS | Status: AC
  Administered 2018-05-09: 1000 mL via INTRAVENOUS

## 2018-05-09 MED ORDER — METHYLPREDNISOLONE SODIUM SUCC 125 MG IJ SOLR
125.0000 mg | Freq: Once | INTRAMUSCULAR | Status: AC
  Administered 2018-05-09: 125 mg via INTRAVENOUS
  Filled 2018-05-09: qty 2

## 2018-05-09 MED ORDER — PREDNISONE 20 MG PO TABS: 40.0000 mg | ORAL_TABLET | Freq: Every day | ORAL | 0 refills | Status: AC

## 2018-05-09 NOTE — ED Provider Notes (Signed)
[facility] Emergency Department Provider Note  ___________________________________________   First MD Initiated Contact with Patient 05/09/18 0320     (approximate)  I have reviewed the triage vital signs and the nursing notes.   HISTORY  Chief Complaint Allergic Reaction and Hypotension   HPI [name] is a 72 y.o. male with a history of CHF as well as a long history of hives was presented to the emergency department today with a rash that started approximately 1 AM.  He denies any new medications.  Says that his most recent addition to his medication list was gabapentin 2 months ago.  Denies any new foods, soaps or clothing.  States that the rash became diffuse and itchy.  Wife says that she thinks that he passed out because he was unresponsive until EMS arrived and found his initial pressure to be in the 60s.  Prior to this the patient was able to take 40 mg of his own prednisone as well as 50 mg of Benadryl.  However, the patient vomited on arrival to the emergency department.  Patient denies feeling of his throat closing.  Denies any respiratory difficulty.  Past Medical History:  Diagnosis Date  . Abnormal chest CT   . Benign hypertension   . Cardiomyopathy   . CHF (congestive heart failure) (HCC)   . Chronic renal insufficiency    With creatinine 1.4  . GERD (gastroesophageal reflux disease)   . Hives    Long Hx of hives  . Hyperlipidemia   . IMPLANTATION OF DEFIBRILLATOR, HX OF 02/15/2009   Qualifier: Diagnosis of  By: Jens Som, MD, Lyn Hollingshead   . NSVT (nonsustained ventricular tachycardia) (HCC)   . Osteoarthritis   . Osteoarthrosis, unspecified whether generalized or localized, lower leg   . Osteoporosis   . Oxygen deficiency   . Psoriasis   . PVC (premature ventricular contraction)   . 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 Thoracic Facet hypertrophy  (Bilateral) 01/20/2018  . Thoracic facet syndrome (Bilateral) 01/20/2018  . 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 Thoracic radiculitis (Left) 01/20/2018  . Chronic upper back pain (Left) 01/20/2018  . Aortic atherosclerosis (HCC) 01/20/2018  . Lumbar facet arthropathy 01/20/2018  . Spondylosis without myelopathy or radiculopathy, thoracic region 01/20/2018  . Spondylosis without myelopathy or radiculopathy, lumbosacral region 01/20/2018  . History of rib fracture 01/20/2018  . Diverticulosis, sigmoid 01/20/2018  . Cholelithiasis 01/20/2018  . Thoracic ascending aortic aneurysm (HCC) (4.2 cm) 01/20/2018  . Hx of total knee replacement (Right) 01/20/2018  . Chronic upper quadrant pain (LUQ) (Left) 01/20/2018  . Neurogenic pain 01/20/2018  . Chronic chest wall pain (Primary Area of Pain) (Left) 01/06/2018  . Chronic thoracic back pain (Secondary Area of Pain) (Left) 01/06/2018  . Positive Murphy's Sign 01/05/2018  . Chronic pain syndrome 01/05/2018  . Pharmacologic therapy 01/05/2018  . Disorder of skeletal system 01/05/2018  . Problems influencing health status 01/05/2018  . B12 deficiency 10/06/2016  . Medicare annual wellness visit, initial 10/06/2016  . Abdominal pain (Left) 04/20/2015  . CKD (chronic kidney disease) stage 3, GFR 30-59 ml/min (HCC) 09/18/2014  . Asthma 01/05/2014  . Bruit 08/29/2013  . 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 Chronic systolic heart failure (HCC) 09/09/2011  . Benign essential hypertension 02/15/2009  . Cardiomyopathy, nonischemic (HCC) 02/15/2009  . LUNG NODULE 02/15/2009  . DIZZINESS 02/15/2009  .  Nonspecific (abnormal) findings on radiological and other examination of body structure 02/15/2009  . Nonspecific abnormal findings on radiological and other examination of skull and head 02/15/2009  . Combined hyperlipidemia 10/27/2008  . RHEUMATIC FEVER 10/27/2008  . Asymptomatic PVCs 10/27/2008  .  Other osteoporosis 10/27/2008    Past Surgical History:  Procedure Laterality Date  . BIV ICD GENERTAOR CHANGE OUT N/A 10/08/2012   Procedure: BIV ICD GENERTAOR CHANGE OUT;  Surgeon: Marinus Maw, MD;  Location: [facility] CATH LAB;  Service: Cardiovascular;  Laterality: N/A;  . CARDIAC CATHETERIZATION    . CARDIAC DEFIBRILLATOR REMOVAL  2014  . CATARACT EXTRACTION    . CATARACT EXTRACTION W/PHACO Left 03/12/2016   Procedure: CATARACT EXTRACTION PHACO AND INTRAOCULAR LENS PLACEMENT (IOC);  Surgeon: Sallee Lange, MD;  Location: [facility] ORS;  Service: Ophthalmology;  Laterality: Left;  [place] 01:32 AP% 25.7 CDE 41.33 fluid pack lot # [id number]  . Implantation of dual-chamber ICD.     St. Jude DDD-ICD 1/07  . KNEE ARTHROPLASTY Left 2006  . KNEE ARTHROPLASTY Right 12/10   x2  . TONSILLECTOMY      Prior to Admission medications   Medication Sig Start Date End Date Taking? Authorizing Provider  amiodarone (PACERONE) 200 MG tablet TAKE 1 TABLET (200 MG TOTAL) BY MOUTH DAILY. 11/09/17   Lewayne Bunting, MD  atorvastatin (LIPITOR) 40 MG tablet TAKE 1/2 TABLET (20 MG TOTAL) BY MOUTH DAILY. 02/16/18   Lewayne Bunting, MD  carvedilol (COREG) 25 MG tablet TAKE 2 TABLETS (50 MG TOTAL) BY MOUTH 2 (TWO) TIMES DAILY WITH A MEAL. 02/15/18   Lewayne Bunting, MD  Cyanocobalamin (VITAMIN B12 SL) Place 1 tablet under the tongue daily.    [provider]  EPINEPHrine (EPIPEN 2-PAK) 0.3 mg/0.3 mL IJ SOAJ injection Inject 0.3 mLs (0.3 mg total) into the muscle once for 1 dose. 05/09/18 05/09/18  Myrna Blazer, MD  furosemide (LASIX) 20 MG tablet TAKE 2 TABLETS (40 MG TOTAL) BY MOUTH DAILY. TAKE AN EXTRA 20MG  DAILY AS NEEDED 08/07/16   Rollene Rotunda, MD  gabapentin (NEURONTIN) 100 MG capsule 1 cap TID and 3 cap(s) at HS. Max: 5/day 02/24/18   Delano Metz, MD  hydrALAZINE (APRESOLINE) 50 MG tablet TAKE 1 & 1/2 TABLET BY MOUTH 3 TIMES DAILY 08/03/17   Lewayne Bunting, MD  isosorbide  mononitrate (IMDUR) 30 MG 24 hr tablet Take 1 tablet (30 mg total) by mouth daily. 03/21/13 01/21/18  Lewayne Bunting, MD  Lido-Capsaicin-Men-Methyl Sal (1ST MEDX-PATCH/ LIDOCAINE) 4-0.025-5-20 % PTCH Apply 1 patch topically at bedtime. 11/12/17   Merwyn Katos, MD  OXYGEN Inhale 2L for 8 hours at night.    [provider]  pantoprazole (PROTONIX) 40 MG tablet Take 40 mg by mouth daily.     [provider]  potassium chloride (K-DUR) 10 MEQ tablet Take 10 mEq by mouth daily.    [provider]  predniSONE (DELTASONE) 20 MG tablet Take 2 tablets (40 mg total) by mouth daily for 7 days. 05/09/18 05/16/18  Myrna Blazer, MD    Allergies Ace inhibitors; Aspirin; Indocin [indomethacin]; Mobic [meloxicam]; and Nsaids  Family History  Problem Relation Age of Onset  . Cancer Mother   . Lung cancer Mother   . Heart attack Father     Social History Social History   Tobacco Use  . Smoking status: Former Smoker    Packs/day: 1.00    Years: 10.00    Pack years: 10.00  Types: Cigarettes    Last attempt to quit: 12/31/1970    Years since quitting: 47.3  . Smokeless tobacco: Never Used  Substance Use Topics  . Alcohol use: Yes    Alcohol/week: 0.0 standard drinks    Comment: 2 DRINKS PER DAY, none this week (7/26)  . Drug use: No    Review of Systems  Constitutional: No fever/chills Eyes: No visual changes. ENT: No sore throat. Cardiovascular: Denies chest pain. Respiratory: Denies shortness of breath. Gastrointestinal: No abdominal pain.  No nausea, no vomiting.  No diarrhea.  No constipation. Genitourinary: Negative for dysuria. Musculoskeletal: Negative for back pain. Skin: As above Neurological: Negative for headaches, focal weakness or numbness.   ____________________________________________   PHYSICAL EXAM:  VITAL SIGNS: ED Triage Vitals  Enc Vitals Group     BP 05/09/18 0327 (!) 99/57     Pulse Rate 05/09/18 0328 (!) 58     Resp  05/09/18 0329 18     Temp 05/09/18 0329 (!) 96.2 F (35.7 C)     Temp Source 05/09/18 0329 Rectal     SpO2 05/09/18 0328 93 %     Weight 05/09/18 0338 195 lb (88.5 kg)     Height 05/09/18 0338 5\' 9"  (1.753 m)     Head Circumference --      Peak Flow --      Pain Score 05/09/18 0338 0     Pain Loc --      Pain Edu? --      Excl. in GC? --     Constitutional: Alert and oriented.  Patient with diffuse urticarial rash.  However, no respiratory distress.  Speaks normal voice.  Controlling secretions. Eyes: Conjunctivae are normal.  Head: Atraumatic. Nose: No congestion/rhinnorhea. Mouth/Throat: Mucous membranes are moist.  Neck: No stridor.   Cardiovascular: Normal rate, regular rhythm. Grossly normal heart sounds.  Respiratory: Normal respiratory effort.  No retractions. Lungs CTAB. Gastrointestinal: Soft and nontender. No distention.  Musculoskeletal: No lower extremity tenderness nor edema.  No joint effusions. Neurologic:  Normal speech and language. No gross focal neurologic deficits are appreciated. Skin: Diffuse urticarial rash.  No lesions to the palms or soles. Psychiatric: Mood and affect are normal. Speech and behavior are normal.  ____________________________________________   LABS (all labs ordered are listed, but only abnormal results are displayed)  Labs Reviewed  CBC WITH DIFFERENTIAL/PLATELET - Abnormal; Notable for the following components:      Result Value   WBC 23.1 (*)    Neutro Abs 20.1 (*)    Monocytes Absolute 1.3 (*)    All other components within normal limits  BASIC METABOLIC PANEL - Abnormal; Notable for the following components:   Glucose, Bld 154 (*)    Creatinine, Ser 1.66 (*)    Calcium 7.7 (*)    GFR calc non Af Amer 40 (*)    GFR calc Af Amer 47 (*)    All other components within normal limits  TSH - Abnormal; Notable for the following components:   TSH 5.535 (*)    All other components within normal limits  TROPONIN I    ____________________________________________  EKG  ED ECG REPORT I, Arelia Longest, the attending physician, personally viewed and interpreted this ECG.   Date: 05/09/2018  EKG Time: 0351  Rate: 55  Rhythm: sinus bradycardia with several PVCs.  Axis: Normal  Intervals:Prolonged QTC at 514, first-degree AV block  ST&T Change: No ST segment elevation or depression.  No abnormal T wave inversion.  ____________________________________________  RADIOLOGY   ____________________________________________   PROCEDURES  Procedure(s) performed:   Procedures  Critical Care performed:   ____________________________________________   INITIAL IMPRESSION / ASSESSMENT AND PLAN / ED COURSE  Pertinent labs & imaging results that were available during my care of the patient were reviewed by me and considered in my medical decision making (see chart for details).  DDX: Anaphylaxis, urticaria, allergic reaction As part of my medical decision making, I reviewed the following data within the electronic MEDICAL RECORD NUMBERFrom prior office visits.  ----------------------------------------- 5:27 AM on 05/09/2018 -----------------------------------------  Patient at this time with almost complete resolution of the rash.  Blood pressure in the 130s over 80s.  No longer itching.  We will observe for 4 hours.  Patient likely to be discharged with prednisone as well as an EpiPen.  ----------------------------------------- 7:08 AM on 05/09/2018 -----------------------------------------  Patient at this time with complete resolution of symptoms as well as rash.  Vital signs have normalized.  Patient with elevated white blood cell count, likely reactive/inflammatory secondary to urticarial reaction.  Elevated creatinine but given IV fluids as well.  Patient aware of diagnosis and treatment plan.  Likely recurrent urticaria but with anaphylactic  characteristics. ____________________________________________   FINAL CLINICAL IMPRESSION(S) / ED DIAGNOSES  Final diagnoses:  Anaphylaxis, initial encounter      NEW MEDICATIONS STARTED DURING THIS VISIT:  New Prescriptions   EPINEPHRINE (EPIPEN 2-PAK) 0.3 MG/0.3 ML IJ SOAJ INJECTION    Inject 0.3 mLs (0.3 mg total) into the muscle once for 1 dose.   PREDNISONE (DELTASONE) 20 MG TABLET    Take 2 tablets (40 mg total) by mouth daily for 7 days.     Note:  This document was prepared using Dragon voice recognition software and may include unintentional dictation errors.     Myrna Blazer, MD 05/09/18 [phone]

## 2018-05-09 NOTE — ED Triage Notes (Addendum)
Pt stated that he thinks he is having an allergic reaction to some mediation that he has taken. Pt has red flushed skin with hives noted to his body. Pt took 50mg  of benadryl and steriod before arriving to ed. EMS stated that pts BP on arrival was 70/40's and IV was placed and pt received about 700 ml of NS

## 2018-05-10 ENCOUNTER — Other Ambulatory Visit: Payer: Self-pay | Admitting: Cardiology

## 2018-05-10 ENCOUNTER — Emergency Department
Admission: EM | Admit: 2018-05-10 | Discharge: 2018-05-10 | Disposition: A | Discharge status: Home / Self Care | Payer: Medicare Other | Attending: Emergency Medicine | Admitting: Emergency Medicine

## 2018-05-10 ENCOUNTER — Other Ambulatory Visit: Payer: Self-pay

## 2018-05-10 DIAGNOSIS — I130 Hypertensive heart and chronic kidney disease with heart failure and stage 1 through stage 4 chronic kidney disease, or unspecified chronic kidney disease: Secondary | ICD-10-CM | POA: Diagnosis not present

## 2018-05-10 DIAGNOSIS — Z95810 Presence of automatic (implantable) cardiac defibrillator: Secondary | ICD-10-CM | POA: Diagnosis not present

## 2018-05-10 DIAGNOSIS — T7840XA Allergy, unspecified, initial encounter: Secondary | ICD-10-CM | POA: Diagnosis not present

## 2018-05-10 DIAGNOSIS — N183 Chronic kidney disease, stage 3 (moderate): Secondary | ICD-10-CM | POA: Diagnosis not present

## 2018-05-10 DIAGNOSIS — Z96653 Presence of artificial knee joint, bilateral: Secondary | ICD-10-CM | POA: Diagnosis not present

## 2018-05-10 DIAGNOSIS — Z87891 Personal history of nicotine dependence: Secondary | ICD-10-CM | POA: Diagnosis not present

## 2018-05-10 DIAGNOSIS — Z79899 Other long term (current) drug therapy: Secondary | ICD-10-CM | POA: Insufficient documentation

## 2018-05-10 DIAGNOSIS — L509 Urticaria, unspecified: Secondary | ICD-10-CM | POA: Insufficient documentation

## 2018-05-10 DIAGNOSIS — I5022 Chronic systolic (congestive) heart failure: Secondary | ICD-10-CM | POA: Insufficient documentation

## 2018-05-10 MED ORDER — AMIODARONE HCL 200 MG PO TABS: 200.0000 mg | ORAL_TABLET | Freq: Every day | ORAL | 0 refills | Status: DC

## 2018-05-10 MED ORDER — DIPHENHYDRAMINE HCL 50 MG/ML IJ SOLN
INTRAMUSCULAR | Status: AC
  Administered 2018-05-10: 25 mg via INTRAVENOUS
  Filled 2018-05-10: qty 1

## 2018-05-10 MED ORDER — METHYLPREDNISOLONE SODIUM SUCC 125 MG IJ SOLR
125.0000 mg | Freq: Once | INTRAMUSCULAR | Status: AC
  Administered 2018-05-10: 125 mg via INTRAVENOUS
  Filled 2018-05-10: qty 2

## 2018-05-10 MED ORDER — DIPHENHYDRAMINE HCL 50 MG/ML IJ SOLN
25.0000 mg | Freq: Once | INTRAMUSCULAR | Status: AC
  Administered 2018-05-10: 25 mg via INTRAVENOUS

## 2018-05-10 MED ORDER — FAMOTIDINE IN NACL 20-0.9 MG/50ML-% IV SOLN
20.0000 mg | Freq: Once | INTRAVENOUS | Status: AC
  Administered 2018-05-10: 20 mg via INTRAVENOUS
  Filled 2018-05-10: qty 50

## 2018-05-10 NOTE — Telephone Encounter (Signed)
Rx(s) sent to pharmacy electronically.  

## 2018-05-10 NOTE — ED Provider Notes (Signed)
[facility] Emergency Department Provider Note    First MD Initiated Contact with Patient 05/10/18 [phone]     (approximate)  I have reviewed the triage vital signs and the nursing notes.   HISTORY  Chief Complaint Allergic Reaction   HPI [name] is a 72 y.o. male presents to the emergency department with acute onset of generalized pruritus hives which began a proximally 1 hour before arrival.  Patient states that he took 50 mg of Benadryl with minimal improvement.  Patient denies any dyspnea chest pain lightheadedness or shortness of breath at this time.  Patient was recently seen in the emergency department on 05/09/2018 secondary to an anaphylactic reaction for which the patient was prescribed prednisone which he states that he took his dose today.   Past Medical History:  Diagnosis Date  . Abnormal chest CT   . Benign hypertension   . Cardiomyopathy   . CHF (congestive heart failure) (HCC)   . Chronic renal insufficiency    With creatinine 1.4  . GERD (gastroesophageal reflux disease)   . Hives    Long Hx of hives  . Hyperlipidemia   . IMPLANTATION OF DEFIBRILLATOR, HX OF 02/15/2009   Qualifier: Diagnosis of  By: Jens Som, MD, Lyn Hollingshead   . NSVT (nonsustained ventricular tachycardia) (HCC)   . Osteoarthritis   . Osteoarthrosis, unspecified whether generalized or localized, lower leg   . Osteoporosis   . Oxygen deficiency   . Psoriasis   . PVC (premature ventricular contraction)   . 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 Thoracic Facet hypertrophy (Bilateral) 01/20/2018  . Thoracic facet syndrome (Bilateral) 01/20/2018  . 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 Thoracic radiculitis (Left) 01/20/2018  . Chronic upper back pain (Left) 01/20/2018  . Aortic atherosclerosis (HCC) 01/20/2018  . Lumbar facet arthropathy 01/20/2018  . Spondylosis  without myelopathy or radiculopathy, thoracic region 01/20/2018  . Spondylosis without myelopathy or radiculopathy, lumbosacral region 01/20/2018  . History of rib fracture 01/20/2018  . Diverticulosis, sigmoid 01/20/2018  . Cholelithiasis 01/20/2018  . Thoracic ascending aortic aneurysm (HCC) (4.2 cm) 01/20/2018  . Hx of total knee replacement (Right) 01/20/2018  . Chronic upper quadrant pain (LUQ) (Left) 01/20/2018  . Neurogenic pain 01/20/2018  . Chronic chest wall pain (Primary Area of Pain) (Left) 01/06/2018  . Chronic thoracic back pain (Secondary Area of Pain) (Left) 01/06/2018  . Positive Murphy's Sign 01/05/2018  . Chronic pain syndrome 01/05/2018  . Pharmacologic therapy 01/05/2018  . Disorder of skeletal system 01/05/2018  . Problems influencing health status 01/05/2018  . B12 deficiency 10/06/2016  . Medicare annual wellness visit, initial 10/06/2016  . Abdominal pain (Left) 04/20/2015  . CKD (chronic kidney disease) stage 3, GFR 30-59 ml/min (HCC) 09/18/2014  . Asthma 01/05/2014  . Bruit 08/29/2013  . 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 Chronic systolic heart failure (HCC) 09/09/2011  . Benign essential hypertension 02/15/2009  . Cardiomyopathy, nonischemic (HCC) 02/15/2009  . LUNG NODULE 02/15/2009  . DIZZINESS 02/15/2009  . Nonspecific (abnormal) findings on radiological and other examination of body structure 02/15/2009  . Nonspecific abnormal findings on radiological and other examination of skull and head 02/15/2009  . Combined hyperlipidemia 10/27/2008  . RHEUMATIC FEVER 10/27/2008  . Asymptomatic PVCs 10/27/2008  . Other osteoporosis 10/27/2008    Past Surgical History:  Procedure Laterality Date  . BIV ICD GENERTAOR CHANGE  OUT N/A 10/08/2012   Procedure: BIV ICD GENERTAOR CHANGE OUT;  Surgeon: Marinus Maw, MD;  Location: [facility] CATH LAB;  Service: Cardiovascular;  Laterality: N/A;  . CARDIAC CATHETERIZATION    . CARDIAC  DEFIBRILLATOR REMOVAL  2014  . CATARACT EXTRACTION    . CATARACT EXTRACTION W/PHACO Left 03/12/2016   Procedure: CATARACT EXTRACTION PHACO AND INTRAOCULAR LENS PLACEMENT (IOC);  Surgeon: Sallee Lange, MD;  Location: [facility] ORS;  Service: Ophthalmology;  Laterality: Left;  [place] 01:32 AP% 25.7 CDE 41.33 fluid pack lot # [id number]  . Implantation of dual-chamber ICD.     St. Jude DDD-ICD 1/07  . KNEE ARTHROPLASTY Left 2006  . KNEE ARTHROPLASTY Right 12/10   x2  . TONSILLECTOMY      Prior to Admission medications   Medication Sig Start Date End Date Taking? Authorizing Provider  amiodarone (PACERONE) 200 MG tablet Take 1 tablet (200 mg total) by mouth daily. 05/10/18   Lewayne Bunting, MD  atorvastatin (LIPITOR) 40 MG tablet TAKE 1/2 TABLET (20 MG TOTAL) BY MOUTH DAILY. 02/16/18   Lewayne Bunting, MD  carvedilol (COREG) 25 MG tablet TAKE 2 TABLETS (50 MG TOTAL) BY MOUTH 2 (TWO) TIMES DAILY WITH A MEAL. 02/15/18   Lewayne Bunting, MD  Cyanocobalamin (VITAMIN B12 SL) Place 1 tablet under the tongue daily.    [provider]  furosemide (LASIX) 20 MG tablet TAKE 2 TABLETS (40 MG TOTAL) BY MOUTH DAILY. TAKE AN EXTRA 20MG  DAILY AS NEEDED 08/07/16   Rollene Rotunda, MD  gabapentin (NEURONTIN) 100 MG capsule 1 cap TID and 3 cap(s) at HS. Max: 5/day 02/24/18   Delano Metz, MD  hydrALAZINE (APRESOLINE) 50 MG tablet TAKE 1 & 1/2 TABLET BY MOUTH 3 TIMES DAILY 08/03/17   Lewayne Bunting, MD  isosorbide mononitrate (IMDUR) 30 MG 24 hr tablet Take 1 tablet (30 mg total) by mouth daily. 03/21/13 01/21/18  Lewayne Bunting, MD  Lido-Capsaicin-Men-Methyl Sal (1ST MEDX-PATCH/ LIDOCAINE) 4-0.025-5-20 % PTCH Apply 1 patch topically at bedtime. 11/12/17   Merwyn Katos, MD  OXYGEN Inhale 2L for 8 hours at night.    [provider]  pantoprazole (PROTONIX) 40 MG tablet Take 40 mg by mouth daily.     [provider]  potassium chloride (K-DUR) 10 MEQ tablet Take 10 mEq by mouth  daily.    [provider]  predniSONE (DELTASONE) 20 MG tablet Take 2 tablets (40 mg total) by mouth daily for 7 days. 05/09/18 05/16/18  Myrna Blazer, MD    Allergies Ace inhibitors; Aspirin; Indocin [indomethacin]; Mobic [meloxicam]; and Nsaids  Family History  Problem Relation Age of Onset  . Cancer Mother   . Lung cancer Mother   . Heart attack Father     Social History Social History   Tobacco Use  . Smoking status: Former Smoker    Packs/day: 1.00    Years: 10.00    Pack years: 10.00    Types: Cigarettes    Last attempt to quit: 12/31/1970    Years since quitting: 47.3  . Smokeless tobacco: Never Used  Substance Use Topics  . Alcohol use: Yes    Alcohol/week: 0.0 standard drinks    Comment: 2 DRINKS PER DAY, none this week (7/26)  . Drug use: No    Review of Systems Constitutional: No fever/chills Eyes: No visual changes. ENT: No sore throat. Cardiovascular: Denies chest pain. Respiratory: Denies shortness of breath. Gastrointestinal: No abdominal pain.  No nausea, no vomiting.  No diarrhea.  No constipation. Genitourinary: Negative for dysuria. Musculoskeletal: Negative for neck pain.  Negative for back pain. Integumentary: Positive for generalized pruritus and hives Neurological: Negative for headaches, focal weakness or numbness.   ____________________________________________   PHYSICAL EXAM:  VITAL SIGNS: ED Triage Vitals [05/10/18 0301]  Enc Vitals Group     BP 108/75     Pulse Rate 77     Resp 16     Temp 98 F (36.7 C)     Temp Source Oral     SpO2 94 %     Weight 88.5 kg (195 lb)     Height 1.753 m (5\' 9" )     Head Circumference      Peak Flow      Pain Score 0     Pain Loc      Pain Edu?      Excl. in GC?     Constitutional: Alert and oriented. Well appearing and in no acute distress. Eyes: Conjunctivae are normal. PERRL. EOMI. Head: Atraumatic. Mouth/Throat: Mucous membranes are moist.  Oropharynx  non-erythematous. Neck: No stridor.   Cardiovascular: Normal rate, regular rhythm. Good peripheral circulation. Grossly normal heart sounds. Respiratory: Normal respiratory effort.  No retractions. Lungs CTAB. Gastrointestinal: Soft and nontender. No distention.  Musculoskeletal: No lower extremity tenderness nor edema. No gross deformities of extremities. Neurologic:  Normal speech and language. No gross focal neurologic deficits are appreciated.  Skin:  Skin is warm, dry and intact.  Generalized hives  Psychiatric: Mood and affect are normal. Speech and behavior are normal.    Procedures   ____________________________________________   INITIAL IMPRESSION / ASSESSMENT AND PLAN / ED COURSE  As part of my medical decision making, I reviewed the following data within the electronic MEDICAL RECORD [id number] year old male presenting with above-stated history and physical exam consistent with acute allergic reaction.  Patient given IV Benadryl 25 mg, IV Solu-Medrol 125 mg and Pepcid 20 mg with complete resolution of urticarial rash.  Patient with no difficulty breathing or swallowing.  Patient advised to go directly from the emergency department and fill prescription for the EpiPen which she was prescribed last visit.  Patient referred to Dr. Adron Bene for further outpatient evaluation ____________________________________________  FINAL CLINICAL IMPRESSION(S) / ED DIAGNOSES  Final diagnoses:  Allergic reaction, initial encounter     MEDICATIONS GIVEN DURING THIS VISIT:  Medications  famotidine (PEPCID) IVPB 20 mg premix (0 mg Intravenous Stopped 05/10/18 0344)  methylPREDNISolone sodium succinate (SOLU-MEDROL) 125 mg/2 mL injection 125 mg (125 mg Intravenous Given 05/10/18 0318)  diphenhydrAMINE (BENADRYL) injection 25 mg (25 mg Intravenous Given 05/10/18 0320)     ED Discharge Orders    None       Note:  This document was prepared using Dragon voice recognition software and may include  unintentional dictation errors.    Darci Current, MD 05/10/18 2257

## 2018-05-10 NOTE — ED Triage Notes (Signed)
Pt was here last pm with anaphylaxis. Pt presents tonight with hives noted to entire body, cough. Pt denies shob, no angioedema noted. Pt took 50mg  benadryl pta,

## 2018-05-12 ENCOUNTER — Other Ambulatory Visit: Payer: Self-pay | Admitting: *Deleted

## 2018-05-20 NOTE — Progress Notes (Signed)
HPI: FU nonischemic cardiomyopathy. Cardiac cath at [facility] in 2004 showed normal coronaries and EF 24; endocardial biopsy unrevealing. 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 Previous holter monitor in Dec 2012 showed frequent PVCs and nonsustained ventricular tachycardia. He also has had a previous ICD (generator removed in February of 2014 as his device had reached ERI and his LV function had improved). Abd ultrasound 1/15 showed no aneurysm.Echocardiogram repeated March 2018 and showed ejection fraction 35-40%, mild left ventricular enlargement, mild left ventricular hypertrophy, grade 1 diastolic dysfunction, mild aortic and mitral regurgitation, biatrial enlargement and mild right ventricular enlargement.  CTA May 2019 showed thoracic aortic aneurysm measuring 4.3 cm.  Patient had 2 recent anaphylactic reactions.  He has been seen by an allergist and was told it was related to dairy products.  Since last seen,  he denies dyspnea, chest pain, palpitations or syncope.  No increased pedal edema.  Current Outpatient Medications  Medication Sig Dispense Refill  . amiodarone (PACERONE) 200 MG tablet Take 1 tablet (200 mg total) by mouth daily. 90 tablet 0  . atorvastatin (LIPITOR) 40 MG tablet TAKE 1/2 TABLET (20 MG TOTAL) BY MOUTH DAILY. 45 tablet 3  . carvedilol (COREG) 25 MG tablet TAKE 2 TABLETS (50 MG TOTAL) BY MOUTH 2 (TWO) TIMES DAILY WITH A MEAL. 360 tablet 3  . Cyanocobalamin (VITAMIN B12 SL) Place 1 tablet under the tongue daily.    . furosemide (LASIX) 20 MG tablet TAKE 2 TABLETS (40 MG TOTAL) BY MOUTH DAILY. TAKE AN EXTRA 20MG  DAILY AS NEEDED 180 tablet 1  . gabapentin (NEURONTIN) 100 MG capsule 1 cap TID and 3 cap(s) at HS. Max: 5/day 150 capsule 5  . hydrALAZINE (APRESOLINE) 50 MG tablet TAKE 1 & 1/2 TABLET BY MOUTH 3 TIMES DAILY 405 tablet 1  . isosorbide mononitrate (IMDUR) 30 MG 24 hr tablet Take 1  tablet (30 mg total) by mouth daily.    . OXYGEN Inhale 2L for 8 hours at night.    . pantoprazole (PROTONIX) 40 MG tablet Take 40 mg by mouth daily.     . potassium chloride (K-DUR) 10 MEQ tablet Take 10 mEq by mouth daily.     No current facility-administered medications for this visit.      Past Medical History:  Diagnosis Date  . Abnormal chest CT   . Benign hypertension   . Cardiomyopathy   . CHF (congestive heart failure) (HCC)   . Chronic renal insufficiency    With creatinine 1.4  . GERD (gastroesophageal reflux disease)   . Hives    Long Hx of hives  . Hyperlipidemia   . IMPLANTATION OF DEFIBRILLATOR, HX OF 02/15/2009   Qualifier: Diagnosis of  By: Jens Som, MD, Lyn Hollingshead   . NSVT (nonsustained ventricular tachycardia) (HCC)   . Osteoarthritis   . Osteoarthrosis, unspecified whether generalized or localized, lower leg   . Osteoporosis   . Oxygen deficiency   . Psoriasis   . PVC (premature ventricular contraction)   . 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 BIV ICD GENERTAOR CHANGE OUT N/A 10/08/2012   Procedure: BIV ICD GENERTAOR CHANGE OUT;  Surgeon: Marinus Maw, MD;  [facility] LAB;  Service: Cardiovascular;  Laterality: N/A;  . CARDIAC CATHETERIZATION    . CARDIAC DEFIBRILLATOR REMOVAL  2014  . CATARACT  EXTRACTION    . CATARACT EXTRACTION W/PHACO Left 03/12/2016   Procedure: CATARACT EXTRACTION PHACO AND INTRAOCULAR LENS PLACEMENT (IOC);  Surgeon: Sallee Lange, MD;  Location: [facility] ORS;  Service: Ophthalmology;  Laterality: Left;  [place] 01:32 AP% 25.7 CDE 41.33 fluid pack lot # [id number]  . Implantation of dual-chamber ICD.     [employer] Jude DDD-ICD 1/07  . KNEE ARTHROPLASTY Left 2006  . KNEE ARTHROPLASTY Right 12/10   x2  . TONSILLECTOMY      Social History   Socioeconomic History  . Marital status: Married    Spouse name: Not on file  . Number of children: Not on file  .  Years of education: Not on file  . Highest education level: Not on file  Occupational History  . Not on file  Social Needs  . Financial resource strain: Not on file  . Food insecurity:    Worry: Not on file    Inability: Not on file  . Transportation needs:    Medical: Not on file    Non-medical: Not on file  Tobacco Use  . Smoking status: Former Smoker    Packs/day: 1.00    Years: 10.00    Pack years: 10.00    Types: Cigarettes    Last attempt to quit: 12/31/1970    Years since quitting: 47.4  . Smokeless tobacco: Never Used  Substance and Sexual Activity  . Alcohol use: Yes    Alcohol/week: 0.0 standard drinks    Comment: 2 DRINKS PER DAY, none this week (7/26)  . Drug use: No  . Sexual activity: Not on file  Lifestyle  . Physical activity:    Days per week: Not on file    Minutes per session: Not on file  . Stress: Not on file  Relationships  . Social connections:    Talks on phone: Not on file    Gets together: Not on file    Attends religious service: Not on file    Active member of club or organization: Not on file    Attends meetings of clubs or organizations: Not on file    Relationship status: Not on file  . Intimate partner violence:    Fear of current or ex partner: Not on file    Emotionally abused: Not on file    Physically abused: Not on file    Forced sexual activity: Not on file  Other Topics Concern  . Not on file  Social History Narrative  . Not on file    Family History  Problem Relation Age of Onset  . Cancer Mother   . Lung cancer Mother   . Heart attack Father     ROS: no fevers or chills, productive cough, hemoptysis, dysphasia, odynophagia, melena, hematochezia, dysuria, hematuria, rash, seizure activity, orthopnea, PND, pedal edema, claudication. Remaining systems are negative.  Physical Exam: Well-developed well-nourished in no acute distress.  Skin is warm and dry.  HEENT is normal.  Neck is supple.  Chest is clear to  auscultation with normal expansion.  Cardiovascular exam is regular rate and rhythm. 1/6 systolic murmur apex  Abdominal exam nontender or distended. No masses palpated. Extremities show no edema. neuro grossly intact  A/P  1 cardiomyopathy-nonischemic.  We will continue with beta-blockade, hydralazine and nitrates.  He has an allergy to ACE inhibitors and ARB's.  Repeat echocardiogram.  2 chronic systolic congestive heart failure-patient appears to be doing well from a volume standpoint.  Continue present dose of Lasix.  Follow low-sodium  diet and continue fluid restriction.  3 hypertension-patient's blood pressure is controlled today.  Continue present medications and follow.  4 thoracic aortic aneurysm-plan follow-up CTA May 2020.  5 frequent PVCs-this was felt to contribute to his cardiomyopathy in the past.  Continue amiodarone.  Previous CT May showed resolution of previously seen groundglass opacities.  6 hyperlipidemia-continue statin.  7 chronic stage III kidney disease-followed by Dr. Hyacinth Meeker.  8 mildly elevated TSH-noted on recent laboratories.  Follow-up primary care.  Olga Millers, MD

## 2018-05-27 ENCOUNTER — Ambulatory Visit: Payer: Medicare Other | Admitting: Cardiology

## 2018-05-27 ENCOUNTER — Encounter: Payer: Self-pay | Admitting: Cardiology

## 2018-05-27 VITALS — BP 122/70 | HR 59 | Ht 69.0 in | Wt 192.2 lb

## 2018-05-27 DIAGNOSIS — I712 Thoracic aortic aneurysm, without rupture: Secondary | ICD-10-CM | POA: Diagnosis not present

## 2018-05-27 DIAGNOSIS — I429 Cardiomyopathy, unspecified: Secondary | ICD-10-CM | POA: Diagnosis not present

## 2018-05-27 DIAGNOSIS — I7121 Aneurysm of the ascending aorta, without rupture: Secondary | ICD-10-CM

## 2018-05-27 DIAGNOSIS — I10 Essential (primary) hypertension: Secondary | ICD-10-CM | POA: Diagnosis not present

## 2018-05-27 DIAGNOSIS — I5022 Chronic systolic (congestive) heart failure: Secondary | ICD-10-CM

## 2018-05-27 NOTE — Patient Instructions (Signed)
Medication Instructions:  NO CHANGE If you need a refill on your cardiac medications before your next appointment, please call your pharmacy.   Lab work: If you have labs (blood work) drawn today and your tests are completely normal, you will receive your results only by: Marland Kitchen MyChart Message (if you have MyChart) OR . A paper copy in the mail If you have any lab test that is abnormal or we need to change your treatment, we will call you to review the results.  Testing/Procedures: Your physician has requested that you have an echocardiogram. Echocardiography is a painless test that uses sound waves to create images of your heart. It provides your doctor with information about the size and shape of your heart and how well your heart's chambers and valves are working. This procedure takes approximately one hour. There are no restrictions for this procedure.AT THE [address] LOCATION    Follow-Up: At [facility], you and your health needs are our priority.  As part of our continuing mission to provide you with exceptional heart care, we have created designated Provider Care Teams.  These Care Teams include your primary Cardiologist (physician) and Advanced Practice Providers (APPs -  Physician Assistants and Nurse Practitioners) who all work together to provide you with the care you need, when you need it. You will need a follow up appointment in 6 months.  Please call our office 2 months in advance to schedule this appointment.  You may see Olga Millers MD or one of the following Advanced Practice Providers on your designated Care Team:   Corine Shelter, PA-C Judy Pimple, [place] . Marjie Skiff, PA-C

## 2018-06-04 ENCOUNTER — Ambulatory Visit (HOSPITAL_COMMUNITY): Payer: Medicare Other | Attending: Cardiology

## 2018-06-04 ENCOUNTER — Other Ambulatory Visit: Payer: Self-pay

## 2018-06-04 DIAGNOSIS — I509 Heart failure, unspecified: Secondary | ICD-10-CM | POA: Diagnosis not present

## 2018-06-04 DIAGNOSIS — I429 Cardiomyopathy, unspecified: Secondary | ICD-10-CM | POA: Diagnosis present

## 2018-06-04 DIAGNOSIS — I130 Hypertensive heart and chronic kidney disease with heart failure and stage 1 through stage 4 chronic kidney disease, or unspecified chronic kidney disease: Secondary | ICD-10-CM | POA: Insufficient documentation

## 2018-06-04 DIAGNOSIS — N189 Chronic kidney disease, unspecified: Secondary | ICD-10-CM | POA: Diagnosis not present

## 2018-06-04 DIAGNOSIS — I082 Rheumatic disorders of both aortic and tricuspid valves: Secondary | ICD-10-CM | POA: Diagnosis not present

## 2018-06-12 ENCOUNTER — Other Ambulatory Visit: Payer: Self-pay | Admitting: Pain Medicine

## 2018-06-12 DIAGNOSIS — M792 Neuralgia and neuritis, unspecified: Secondary | ICD-10-CM

## 2018-08-11 ENCOUNTER — Other Ambulatory Visit: Payer: Self-pay | Admitting: Cardiology

## 2018-08-12 ENCOUNTER — Inpatient Hospital Stay
Admission: EM | Admit: 2018-08-12 | Discharge: 2018-08-13 | DRG: 342 | Disposition: A | Discharge status: Home / Self Care | Payer: Medicare Other | Attending: Surgery | Admitting: Surgery

## 2018-08-12 ENCOUNTER — Encounter: Payer: Self-pay | Admitting: Emergency Medicine

## 2018-08-12 ENCOUNTER — Inpatient Hospital Stay: Payer: Medicare Other | Admitting: Anesthesiology

## 2018-08-12 ENCOUNTER — Emergency Department: Payer: Medicare Other

## 2018-08-12 ENCOUNTER — Encounter
Admission: EM | Disposition: A | Discharge status: Home / Self Care | Payer: Self-pay | Source: Home / Self Care | Attending: Surgery

## 2018-08-12 ENCOUNTER — Other Ambulatory Visit: Payer: Self-pay

## 2018-08-12 DIAGNOSIS — K3533 Acute appendicitis with perforation and localized peritonitis, with abscess: Secondary | ICD-10-CM

## 2018-08-12 DIAGNOSIS — Z888 Allergy status to other drugs, medicaments and biological substances status: Secondary | ICD-10-CM | POA: Diagnosis not present

## 2018-08-12 DIAGNOSIS — G894 Chronic pain syndrome: Secondary | ICD-10-CM | POA: Diagnosis present

## 2018-08-12 DIAGNOSIS — Z9842 Cataract extraction status, left eye: Secondary | ICD-10-CM | POA: Diagnosis not present

## 2018-08-12 DIAGNOSIS — I739 Peripheral vascular disease, unspecified: Secondary | ICD-10-CM | POA: Diagnosis present

## 2018-08-12 DIAGNOSIS — K3580 Unspecified acute appendicitis: Secondary | ICD-10-CM

## 2018-08-12 DIAGNOSIS — Z801 Family history of malignant neoplasm of trachea, bronchus and lung: Secondary | ICD-10-CM

## 2018-08-12 DIAGNOSIS — I130 Hypertensive heart and chronic kidney disease with heart failure and stage 1 through stage 4 chronic kidney disease, or unspecified chronic kidney disease: Secondary | ICD-10-CM | POA: Diagnosis present

## 2018-08-12 DIAGNOSIS — Z91011 Allergy to milk products: Secondary | ICD-10-CM

## 2018-08-12 DIAGNOSIS — K219 Gastro-esophageal reflux disease without esophagitis: Secondary | ICD-10-CM | POA: Diagnosis present

## 2018-08-12 DIAGNOSIS — I5022 Chronic systolic (congestive) heart failure: Secondary | ICD-10-CM | POA: Diagnosis present

## 2018-08-12 DIAGNOSIS — I428 Other cardiomyopathies: Secondary | ICD-10-CM | POA: Diagnosis present

## 2018-08-12 DIAGNOSIS — M810 Age-related osteoporosis without current pathological fracture: Secondary | ICD-10-CM | POA: Diagnosis present

## 2018-08-12 DIAGNOSIS — Z95810 Presence of automatic (implantable) cardiac defibrillator: Secondary | ICD-10-CM

## 2018-08-12 DIAGNOSIS — Z961 Presence of intraocular lens: Secondary | ICD-10-CM | POA: Diagnosis present

## 2018-08-12 DIAGNOSIS — E785 Hyperlipidemia, unspecified: Secondary | ICD-10-CM | POA: Diagnosis present

## 2018-08-12 DIAGNOSIS — N183 Chronic kidney disease, stage 3 (moderate): Secondary | ICD-10-CM | POA: Diagnosis present

## 2018-08-12 DIAGNOSIS — L409 Psoriasis, unspecified: Secondary | ICD-10-CM | POA: Diagnosis present

## 2018-08-12 DIAGNOSIS — Z96653 Presence of artificial knee joint, bilateral: Secondary | ICD-10-CM | POA: Diagnosis present

## 2018-08-12 DIAGNOSIS — Z8249 Family history of ischemic heart disease and other diseases of the circulatory system: Secondary | ICD-10-CM | POA: Diagnosis not present

## 2018-08-12 DIAGNOSIS — Z87892 Personal history of anaphylaxis: Secondary | ICD-10-CM

## 2018-08-12 DIAGNOSIS — I2510 Atherosclerotic heart disease of native coronary artery without angina pectoris: Secondary | ICD-10-CM | POA: Diagnosis present

## 2018-08-12 DIAGNOSIS — Z87891 Personal history of nicotine dependence: Secondary | ICD-10-CM

## 2018-08-12 DIAGNOSIS — I493 Ventricular premature depolarization: Secondary | ICD-10-CM | POA: Diagnosis present

## 2018-08-12 HISTORY — PX: LAPAROSCOPIC APPENDECTOMY: SHX408

## 2018-08-12 HISTORY — DX: Anaphylactic shock, unspecified, initial encounter: T78.2XXA

## 2018-08-12 LAB — COMPREHENSIVE METABOLIC PANEL
ALK PHOS: 50 U/L (ref 38–126)
ALT: 20 U/L (ref 0–44)
AST: 20 U/L (ref 15–41)
Albumin: 3.4 g/dL — ABNORMAL LOW (ref 3.5–5.0)
Anion gap: 6 (ref 5–15)
BUN: 11 mg/dL (ref 8–23)
CALCIUM: 8.6 mg/dL — AB (ref 8.9–10.3)
CO2: 29 mmol/L (ref 22–32)
CREATININE: 1.26 mg/dL — AB (ref 0.61–1.24)
Chloride: 103 mmol/L (ref 98–111)
GFR calc non Af Amer: 57 mL/min — ABNORMAL LOW (ref >60)
Glucose, Bld: 96 mg/dL (ref 70–99)
Potassium: 4.1 mmol/L (ref 3.5–5.1)
Sodium: 138 mmol/L (ref 135–145)
Total Bilirubin: 1.3 mg/dL — ABNORMAL HIGH (ref 0.3–1.2)
Total Protein: 6.3 g/dL — ABNORMAL LOW (ref 6.5–8.1)

## 2018-08-12 LAB — URINALYSIS, COMPLETE (UACMP) WITH MICROSCOPIC
Bacteria, UA: NONE SEEN
Bilirubin Urine: NEGATIVE
Glucose, UA: NEGATIVE mg/dL
Hgb urine dipstick: NEGATIVE
Ketones, ur: NEGATIVE mg/dL
Leukocytes, UA: NEGATIVE
Nitrite: NEGATIVE
Protein, ur: 30 mg/dL — AB
SQUAMOUS EPITHELIAL / LPF: NONE SEEN (ref 0–5)
Specific Gravity, Urine: 1.018 (ref 1.005–1.030)
pH: 7 (ref 5.0–8.0)

## 2018-08-12 LAB — CBC
HCT: 40.9 % (ref 39.0–52.0)
Hemoglobin: 13.4 g/dL (ref 13.0–17.0)
MCH: 32.1 pg (ref 26.0–34.0)
MCHC: 32.8 g/dL (ref 30.0–36.0)
MCV: 98.1 fL (ref 80.0–100.0)
Platelets: 217 10*3/uL (ref 150–400)
RBC: 4.17 MIL/uL — ABNORMAL LOW (ref 4.22–5.81)
RDW: 12.9 % (ref 11.5–15.5)
WBC: 12.6 10*3/uL — ABNORMAL HIGH (ref 4.0–10.5)
nRBC: 0 % (ref 0.0–0.2)

## 2018-08-12 LAB — LIPASE, BLOOD: Lipase: 29 U/L (ref 11–51)

## 2018-08-12 LAB — GLUCOSE, CAPILLARY: Glucose-Capillary: 94 mg/dL (ref 70–99)

## 2018-08-12 SURGERY — APPENDECTOMY, LAPAROSCOPIC
Anesthesia: General

## 2018-08-12 MED ORDER — ACETAMINOPHEN 10 MG/ML IV SOLN
INTRAVENOUS | Status: AC
  Filled 2018-08-12: qty 100

## 2018-08-12 MED ORDER — ENOXAPARIN SODIUM 40 MG/0.4ML ~~LOC~~ SOLN
40.0000 mg | SUBCUTANEOUS | Status: DC
  Administered 2018-08-13: 40 mg via SUBCUTANEOUS
  Filled 2018-08-12: qty 0.4

## 2018-08-12 MED ORDER — FUROSEMIDE 40 MG PO TABS: 40.0000 mg | ORAL_TABLET | Freq: Every day | ORAL | Status: DC

## 2018-08-12 MED ORDER — BUPIVACAINE-EPINEPHRINE (PF) 0.5% -1:200000 IJ SOLN
INTRAMUSCULAR | Status: DC | PRN
  Administered 2018-08-12: 30 mL via PERINEURAL

## 2018-08-12 MED ORDER — HYDROMORPHONE HCL 1 MG/ML IJ SOLN: 0.5000 mg | INTRAMUSCULAR | Status: DC | PRN

## 2018-08-12 MED ORDER — MIDAZOLAM HCL 2 MG/2ML IJ SOLN
INTRAMUSCULAR | Status: AC
  Filled 2018-08-12: qty 2

## 2018-08-12 MED ORDER — LACTATED RINGERS IV SOLN
INTRAVENOUS | Status: DC
  Administered 2018-08-12: 18:00:00 via INTRAVENOUS

## 2018-08-12 MED ORDER — LIDOCAINE HCL (CARDIAC) PF 100 MG/5ML IV SOSY
PREFILLED_SYRINGE | INTRAVENOUS | Status: DC | PRN
  Administered 2018-08-12: 100 mg via INTRAVENOUS

## 2018-08-12 MED ORDER — PANTOPRAZOLE SODIUM 40 MG IV SOLR
40.0000 mg | Freq: Every day | INTRAVENOUS | Status: DC
  Administered 2018-08-12: 40 mg via INTRAVENOUS
  Filled 2018-08-12: qty 40

## 2018-08-12 MED ORDER — SODIUM CHLORIDE 0.9 % IV BOLUS
500.0000 mL | Freq: Once | INTRAVENOUS | Status: AC
  Administered 2018-08-12: 500 mL via INTRAVENOUS

## 2018-08-12 MED ORDER — ONDANSETRON HCL 4 MG/2ML IJ SOLN
INTRAMUSCULAR | Status: DC | PRN
  Administered 2018-08-12: 4 mg via INTRAVENOUS

## 2018-08-12 MED ORDER — ONDANSETRON HCL 4 MG/2ML IJ SOLN: 4.0000 mg | Freq: Once | INTRAMUSCULAR | Status: DC | PRN

## 2018-08-12 MED ORDER — PROPOFOL 10 MG/ML IV BOLUS
INTRAVENOUS | Status: DC | PRN
  Administered 2018-08-12: 150 mg via INTRAVENOUS

## 2018-08-12 MED ORDER — ROCURONIUM BROMIDE 100 MG/10ML IV SOLN
INTRAVENOUS | Status: DC | PRN
  Administered 2018-08-12: 40 mg via INTRAVENOUS
  Administered 2018-08-12: 10 mg via INTRAVENOUS

## 2018-08-12 MED ORDER — PROPOFOL 10 MG/ML IV BOLUS
INTRAVENOUS | Status: AC
  Filled 2018-08-12: qty 20

## 2018-08-12 MED ORDER — DIPHENHYDRAMINE HCL 25 MG PO CAPS: 25.0000 mg | ORAL_CAPSULE | Freq: Four times a day (QID) | ORAL | Status: DC | PRN

## 2018-08-12 MED ORDER — ISOSORBIDE MONONITRATE ER 30 MG PO TB24: 30.0000 mg | ORAL_TABLET | Freq: Every day | ORAL | Status: DC

## 2018-08-12 MED ORDER — FENTANYL CITRATE (PF) 100 MCG/2ML IJ SOLN
INTRAMUSCULAR | Status: AC
  Filled 2018-08-12: qty 2

## 2018-08-12 MED ORDER — GABAPENTIN 100 MG PO CAPS
100.0000 mg | ORAL_CAPSULE | Freq: Two times a day (BID) | ORAL | Status: DC
  Administered 2018-08-13 (×2): 100 mg via ORAL
  Filled 2018-08-12 (×2): qty 1

## 2018-08-12 MED ORDER — ROCURONIUM BROMIDE 50 MG/5ML IV SOLN
INTRAVENOUS | Status: AC
  Filled 2018-08-12: qty 1

## 2018-08-12 MED ORDER — HYDRALAZINE HCL 50 MG PO TABS
50.0000 mg | ORAL_TABLET | Freq: Two times a day (BID) | ORAL | Status: DC
  Administered 2018-08-12: 50 mg via ORAL
  Filled 2018-08-12: qty 1

## 2018-08-12 MED ORDER — SUCCINYLCHOLINE CHLORIDE 20 MG/ML IJ SOLN
INTRAMUSCULAR | Status: DC | PRN
  Administered 2018-08-12: 100 mg via INTRAVENOUS

## 2018-08-12 MED ORDER — SUGAMMADEX SODIUM 200 MG/2ML IV SOLN
INTRAVENOUS | Status: DC | PRN
  Administered 2018-08-12: 200 mg via INTRAVENOUS

## 2018-08-12 MED ORDER — DEXAMETHASONE SODIUM PHOSPHATE 10 MG/ML IJ SOLN
INTRAMUSCULAR | Status: AC
  Filled 2018-08-12: qty 1

## 2018-08-12 MED ORDER — DEXAMETHASONE SODIUM PHOSPHATE 10 MG/ML IJ SOLN
INTRAMUSCULAR | Status: DC | PRN
  Administered 2018-08-12: 10 mg via INTRAVENOUS

## 2018-08-12 MED ORDER — FENTANYL CITRATE (PF) 100 MCG/2ML IJ SOLN: 25.0000 ug | INTRAMUSCULAR | Status: DC | PRN

## 2018-08-12 MED ORDER — SUCCINYLCHOLINE CHLORIDE 20 MG/ML IJ SOLN
INTRAMUSCULAR | Status: AC
  Filled 2018-08-12: qty 1

## 2018-08-12 MED ORDER — POLYETHYLENE GLYCOL 3350 17 G PO PACK: 17.0000 g | PACK | Freq: Every day | ORAL | Status: DC | PRN

## 2018-08-12 MED ORDER — PIPERACILLIN-TAZOBACTAM 3.375 G IVPB
3.3750 g | Freq: Three times a day (TID) | INTRAVENOUS | Status: DC
  Administered 2018-08-12 – 2018-08-13 (×3): 3.375 g via INTRAVENOUS
  Filled 2018-08-12 (×3): qty 50

## 2018-08-12 MED ORDER — LIDOCAINE HCL (PF) 2 % IJ SOLN
INTRAMUSCULAR | Status: AC
  Filled 2018-08-12: qty 10

## 2018-08-12 MED ORDER — IOPAMIDOL (ISOVUE-300) INJECTION 61%
100.0000 mL | Freq: Once | INTRAVENOUS | Status: AC | PRN
  Administered 2018-08-12: 100 mL via INTRAVENOUS

## 2018-08-12 MED ORDER — ONDANSETRON HCL 4 MG/2ML IJ SOLN
INTRAMUSCULAR | Status: AC
  Filled 2018-08-12: qty 2

## 2018-08-12 MED ORDER — CARVEDILOL 25 MG PO TABS: 50.0000 mg | ORAL_TABLET | Freq: Two times a day (BID) | ORAL | Status: DC

## 2018-08-12 MED ORDER — ONDANSETRON HCL 4 MG/2ML IJ SOLN: 4.0000 mg | Freq: Four times a day (QID) | INTRAMUSCULAR | Status: DC | PRN

## 2018-08-12 MED ORDER — GABAPENTIN 100 MG PO CAPS: 100.0000 mg | ORAL_CAPSULE | ORAL | Status: DC

## 2018-08-12 MED ORDER — PIPERACILLIN-TAZOBACTAM 3.375 G IVPB 30 MIN
3.3750 g | Freq: Once | INTRAVENOUS | Status: AC
  Administered 2018-08-12: 3.375 g via INTRAVENOUS
  Filled 2018-08-12: qty 50

## 2018-08-12 MED ORDER — PIPERACILLIN-TAZOBACTAM 3.375 G IVPB: 3.3750 g | Freq: Three times a day (TID) | INTRAVENOUS | Status: DC

## 2018-08-12 MED ORDER — SODIUM CHLORIDE 0.9 % IV SOLN
INTRAVENOUS | Status: DC
  Administered 2018-08-12 – 2018-08-13 (×3): via INTRAVENOUS

## 2018-08-12 MED ORDER — POTASSIUM CHLORIDE CRYS ER 10 MEQ PO TBCR
10.0000 meq | EXTENDED_RELEASE_TABLET | Freq: Every day | ORAL | Status: DC
  Administered 2018-08-13: 10 meq via ORAL
  Filled 2018-08-12: qty 1

## 2018-08-12 MED ORDER — EPHEDRINE SULFATE 50 MG/ML IJ SOLN
INTRAMUSCULAR | Status: DC | PRN
  Administered 2018-08-12: 5 mg via INTRAVENOUS
  Administered 2018-08-12: 10 mg via INTRAVENOUS

## 2018-08-12 MED ORDER — FENTANYL CITRATE (PF) 100 MCG/2ML IJ SOLN
INTRAMUSCULAR | Status: DC | PRN
  Administered 2018-08-12 (×2): 50 ug via INTRAVENOUS

## 2018-08-12 MED ORDER — ACETAMINOPHEN 500 MG PO TABS
1000.0000 mg | ORAL_TABLET | Freq: Four times a day (QID) | ORAL | Status: DC
  Administered 2018-08-12 – 2018-08-13 (×4): 1000 mg via ORAL
  Filled 2018-08-12 (×4): qty 2

## 2018-08-12 MED ORDER — GABAPENTIN 300 MG PO CAPS
300.0000 mg | ORAL_CAPSULE | Freq: Every day | ORAL | Status: DC
  Administered 2018-08-12: 300 mg via ORAL
  Filled 2018-08-12: qty 1

## 2018-08-12 MED ORDER — SUGAMMADEX SODIUM 200 MG/2ML IV SOLN
INTRAVENOUS | Status: AC
  Filled 2018-08-12: qty 2

## 2018-08-12 MED ORDER — OXYCODONE HCL 5 MG PO TABS: 5.0000 mg | ORAL_TABLET | ORAL | Status: DC | PRN

## 2018-08-12 MED ORDER — ACETAMINOPHEN 10 MG/ML IV SOLN
INTRAVENOUS | Status: DC | PRN
  Administered 2018-08-12: 1000 mg via INTRAVENOUS

## 2018-08-12 MED ORDER — MIDAZOLAM HCL 2 MG/2ML IJ SOLN
INTRAMUSCULAR | Status: DC | PRN
  Administered 2018-08-12: 2 mg via INTRAVENOUS

## 2018-08-12 MED ORDER — ONDANSETRON 4 MG PO TBDP: 4.0000 mg | ORAL_TABLET | Freq: Four times a day (QID) | ORAL | Status: DC | PRN

## 2018-08-12 MED ORDER — AMIODARONE HCL 200 MG PO TABS
200.0000 mg | ORAL_TABLET | Freq: Every day | ORAL | Status: DC
  Administered 2018-08-13: 200 mg via ORAL
  Filled 2018-08-12: qty 1

## 2018-08-12 SURGICAL SUPPLY — 46 items
BULB RESERV EVAC DRAIN JP 100C (MISCELLANEOUS) ×2 IMPLANT
CANISTER SUCT 1200ML W/VALVE (MISCELLANEOUS) ×2 IMPLANT
CHLORAPREP W/TINT 26ML (MISCELLANEOUS) ×2 IMPLANT
COVER WAND RF STERILE (DRAPES) IMPLANT
CUTTER FLEX LINEAR 45M (STAPLE) ×2 IMPLANT
DERMABOND ADVANCED (GAUZE/BANDAGES/DRESSINGS) ×1
DERMABOND ADVANCED .7 DNX12 (GAUZE/BANDAGES/DRESSINGS) ×1 IMPLANT
DRAIN CHANNEL JP 19F (MISCELLANEOUS) ×2 IMPLANT
DRSG TEGADERM 4X4.75 (GAUZE/BANDAGES/DRESSINGS) ×2 IMPLANT
ELECT CAUTERY BLADE 6.4 (BLADE) ×2 IMPLANT
ELECT REM PT RETURN 9FT ADLT (ELECTROSURGICAL) ×2
ELECTRODE REM PT RTRN 9FT ADLT (ELECTROSURGICAL) ×1 IMPLANT
GAUZE SPONGE 4X4 12PLY STRL (GAUZE/BANDAGES/DRESSINGS) ×2 IMPLANT
GLOVE SURG SYN 7.0 (GLOVE) ×6 IMPLANT
GLOVE SURG SYN 7.5  E (GLOVE) ×3
GLOVE SURG SYN 7.5 E (GLOVE) ×3 IMPLANT
GOWN STRL REUS W/ TWL LRG LVL3 (GOWN DISPOSABLE) ×3 IMPLANT
GOWN STRL REUS W/TWL LRG LVL3 (GOWN DISPOSABLE) ×3
IRRIGATION STRYKERFLOW (MISCELLANEOUS) ×1 IMPLANT
IRRIGATOR STRYKERFLOW (MISCELLANEOUS) ×2
IV NS 1000ML (IV SOLUTION) ×1
IV NS 1000ML BAXH (IV SOLUTION) ×1 IMPLANT
KIT TURNOVER KIT A (KITS) ×2 IMPLANT
LABEL OR SOLS (LABEL) ×2 IMPLANT
LIGASURE LAP MARYLAND 5MM 37CM (ELECTROSURGICAL) IMPLANT
NEEDLE HYPO 22GX1.5 SAFETY (NEEDLE) ×2 IMPLANT
NS IRRIG 500ML POUR BTL (IV SOLUTION) ×2 IMPLANT
PACK LAP CHOLECYSTECTOMY (MISCELLANEOUS) ×2 IMPLANT
PENCIL ELECTRO HAND CTR (MISCELLANEOUS) ×2 IMPLANT
POUCH SPECIMEN RETRIEVAL 10MM (ENDOMECHANICALS) ×2 IMPLANT
RELOAD 45 VASCULAR/THIN (ENDOMECHANICALS) IMPLANT
RELOAD STAPLE TA45 3.5 REG BLU (ENDOMECHANICALS) ×2 IMPLANT
SCISSORS METZENBAUM CVD 33 (INSTRUMENTS) ×2 IMPLANT
SLEEVE ADV FIXATION 5X100MM (TROCAR) ×4 IMPLANT
SUT ETHILON 3-0 FS-10 30 BLK (SUTURE) ×2
SUT MNCRL 4-0 (SUTURE) ×1
SUT MNCRL 4-0 27XMFL (SUTURE) ×1
SUT VIC AB 3-0 SH 27 (SUTURE) ×1
SUT VIC AB 3-0 SH 27X BRD (SUTURE) ×1 IMPLANT
SUT VICRYL 0 AB UR-6 (SUTURE) ×2 IMPLANT
SUTURE EHLN 3-0 FS-10 30 BLK (SUTURE) ×1 IMPLANT
SUTURE MNCRL 4-0 27XMF (SUTURE) ×1 IMPLANT
TRAY FOLEY MTR SLVR 16FR STAT (SET/KITS/TRAYS/PACK) ×2 IMPLANT
TROCAR BALLN GELPORT 12X130M (ENDOMECHANICALS) ×2 IMPLANT
TROCAR Z-THREAD OPTICAL 5X100M (TROCAR) ×2 IMPLANT
TUBING INSUFFLATION (TUBING) ×2 IMPLANT

## 2018-08-12 NOTE — Anesthesia Preprocedure Evaluation (Addendum)
Anesthesia Evaluation  Patient identified by MRN, date of birth, ID band Patient awake    Reviewed: Allergy & Precautions, H&P , NPO status , Patient's Chart, lab work & pertinent test results, reviewed documented beta blocker date and time   Airway Mallampati: II  TM Distance: >3 FB Neck ROM: full    Dental no notable dental hx. (+) Teeth Intact   Pulmonary asthma , COPD, former smoker,    Pulmonary exam normal breath sounds clear to auscultation       Cardiovascular Exercise Tolerance: Poor hypertension, Pt. on home beta blockers and Pt. on medications + CAD, + Peripheral Vascular Disease and +CHF  + Cardiac Defibrillator  Rhythm:regular Rate:Normal     Neuro/Psych  Neuromuscular disease negative psych ROS   GI/Hepatic negative GI ROS, Neg liver ROS, GERD  Medicated,  Endo/Other  negative endocrine ROSdiabetes  Renal/GU Renal InsufficiencyRenal disease     Musculoskeletal  (+) Arthritis , Osteoarthritis,    Abdominal   Peds negative pediatric ROS (+)  Hematology negative hematology ROS (+)   Anesthesia Other Findings Past Medical History: No date: Abnormal chest CT No date: Anaphylactic reaction No date: Benign hypertension No date: Cardiomyopathy No date: CHF (congestive heart failure) (HCC) No date: Chronic renal insufficiency     Comment:  With creatinine 1.4 No date: GERD (gastroesophageal reflux disease) No date: Hives     Comment:  Long Hx of hives No date: Hyperlipidemia 02/15/2009: IMPLANTATION OF DEFIBRILLATOR, HX OF     Comment:  Qualifier: Diagnosis of  By: Jens Som, MD, Lyn Hollingshead  No date: NSVT (nonsustained ventricular tachycardia) (HCC) No date: Osteoarthritis No date: Osteoarthrosis, unspecified whether generalized or localized,  lower leg No date: Osteoporosis No date: Oxygen deficiency No date: Psoriasis No date: PVC (premature ventricular contraction) No  date: 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  EF 35-40%  Reproductive/Obstetrics negative OB ROS                            Anesthesia Physical  Anesthesia Plan  ASA: III  Anesthesia Plan: General   Post-op Pain Management:    Induction: Intravenous  PONV Risk Score and Plan:   Airway Management Planned: Oral ETT  Additional Equipment:   Intra-op Plan:   Post-operative Plan: Extubation in OR  Informed Consent: I have reviewed the patients History and Physical, chart, labs and discussed the procedure including the risks, benefits and alternatives for the proposed anesthesia with the patient or authorized representative who has indicated his/her understanding and acceptance.   Dental advisory given  Plan Discussed with: CRNA and Surgeon  Anesthesia Plan Comments:         Anesthesia Quick Evaluation

## 2018-08-12 NOTE — ED Notes (Signed)
First Nurse Note: Patient to ED via WC from [facility].  Alert and oriented.

## 2018-08-12 NOTE — ED Triage Notes (Signed)
Says right lower quadrant pain since 2 night ago.  Tender to palpation.  Sent here from [facility]

## 2018-08-12 NOTE — Transfer of Care (Signed)
Immediate Anesthesia Transfer of Care Note  Patient: [name]  Procedure(s) Performed: APPENDECTOMY LAPAROSCOPIC (N/A )  Patient Location: PACU  Anesthesia Type:General  Level of Consciousness: awake, alert  and oriented  Airway & Oxygen Therapy: Patient connected to face mask oxygen  Post-op Assessment: Post -op Vital signs reviewed and stable  Post vital signs: stable  Last Vitals:  Vitals Value Taken Time  BP 126/67 08/12/2018  8:29 PM  Temp    Pulse 57 08/12/2018  8:29 PM  Resp 8 08/12/2018  8:29 PM  SpO2 99 % 08/12/2018  8:29 PM  Vitals shown include unvalidated device data.  Last Pain:  Vitals:   08/12/18 1502  TempSrc: Oral  PainSc:          Complications: No apparent anesthesia complications

## 2018-08-12 NOTE — ED Notes (Signed)
Returned from CT, wife at bedside. Waiting on results.

## 2018-08-12 NOTE — Progress Notes (Signed)
Interventional Radiology Progress Note  CT reviewed after receiving request for abscess drainage.  Appearance is felt to most likely relate to bulbous enlargement of the tip of the appendix with probable necrosis rather than separate periappendiceal abscess.  Do not see a definite separate abscess away from the appendix. Will discuss with Dr. Aleen Campi.  Jodi Marble. Fredia Sorrow, M.D Pager:  [phone]

## 2018-08-12 NOTE — H&P (Signed)
Date of Admission:  08/12/2018  Reason for Admission:  Acute appendicitis  History of Present Illness: [name] is a 72 y.o. male presenting with abdominal pain that started on 12/24.  He started having abdominal pain that was somewhat diffuse and has localized to the right lower quadrant. Denies any worsening in the pain over the past two days, but has not improved.  He presented to his PCP today and was sent to the ER for concerns for appendicitis.  Denies any nausea or vomiting or diarrhea.  No sick contacts. In the ED, he had labs showing a WBC of 12.6, stable Cr function, and a CT scan showing appendicitis.  I have independently viewed the patient's CT scan and also discussed the results with Dr. Fredia Sorrow.  He has acute appendicitis with a bulbous appearance to the tip of the appendix concerning for a necrotic or gangrenous tip, but no abscess.  Past Medical History: Past Medical History:  Diagnosis Date  . Abnormal chest CT   . Anaphylactic reaction   . Benign hypertension   . Cardiomyopathy   . CHF (congestive heart failure) (HCC)   . Chronic renal insufficiency    With creatinine 1.4  . GERD (gastroesophageal reflux disease)   . Hives    Long Hx of hives  . Hyperlipidemia   . IMPLANTATION OF DEFIBRILLATOR, HX OF 02/15/2009   Qualifier: Diagnosis of  By: Jens Som, MD, Lyn Hollingshead   . NSVT (nonsustained ventricular tachycardia) (HCC)   . Osteoarthritis   . Osteoarthrosis, unspecified whether generalized or localized, lower leg   . Osteoporosis   . Oxygen deficiency   . Psoriasis   . PVC (premature ventricular contraction)   . 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 BIV ICD GENERTAOR CHANGE OUT N/A 10/08/2012   Procedure: BIV ICD GENERTAOR CHANGE OUT;  Surgeon: Marinus Maw, MD;  Location: [facility] CATH LAB;  Service: Cardiovascular;  Laterality: N/A;  . CARDIAC CATHETERIZATION     . CARDIAC DEFIBRILLATOR REMOVAL  2014  . CATARACT EXTRACTION    . CATARACT EXTRACTION W/PHACO Left 03/12/2016   Procedure: CATARACT EXTRACTION PHACO AND INTRAOCULAR LENS PLACEMENT (IOC);  Surgeon: Sallee Lange, MD;  Location: [facility] ORS;  Service: Ophthalmology;  Laterality: Left;  [place] 01:32 AP% 25.7 CDE 41.33 fluid pack lot # [id number]  . Implantation of dual-chamber ICD.     St. Jude DDD-ICD 1/07  . KNEE ARTHROPLASTY Left 2006  . KNEE ARTHROPLASTY Right 12/10   x2  . TONSILLECTOMY      Home Medications: Prior to Admission medications   Medication Sig Start Date End Date Taking? Authorizing Provider  amiodarone (PACERONE) 200 MG tablet TAKE 1 TABLET BY MOUTH EVERY DAY Patient taking differently: Take 200 mg by mouth daily.  08/12/18  Yes Lewayne Bunting, MD  atorvastatin (LIPITOR) 40 MG tablet TAKE 1/2 TABLET (20 MG TOTAL) BY MOUTH DAILY. Patient taking differently: Take 20 mg by mouth daily at 6 PM.  02/16/18  Yes Crenshaw, Madolyn Frieze, MD  carvedilol (COREG) 25 MG tablet TAKE 2 TABLETS (50 MG TOTAL) BY MOUTH 2 (TWO) TIMES DAILY WITH A MEAL. 02/15/18  Yes Lewayne Bunting, MD  diphenhydrAMINE (BENADRYL) 25 mg capsule Take 25 mg by mouth every 6 (six) hours as needed.   Yes [provider]  furosemide (LASIX) 20 MG tablet TAKE 2 TABLETS (40 MG TOTAL) BY MOUTH DAILY. TAKE  AN EXTRA 20MG  DAILY AS NEEDED 08/07/16  Yes Rollene Rotunda, MD  gabapentin (NEURONTIN) 100 MG capsule 1 cap TID and 3 cap(s) at HS. Max: 5/day Patient taking differently: Take 100 mg by mouth See admin instructions. Take one capsule at breakfast, take one capsule at lunch, and take three capsules at bedtime 02/24/18  Yes Delano Metz, MD  hydrALAZINE (APRESOLINE) 50 MG tablet TAKE 1 & 1/2 TABLET BY MOUTH 3 TIMES DAILY Patient taking differently: Take 50 mg by mouth 2 (two) times daily. Take 1 & 1/2 tablet by mouth 3 times daily 08/03/17  Yes Crenshaw, Madolyn Frieze, MD  isosorbide mononitrate (IMDUR) 30 MG 24 hr  tablet Take 1 tablet (30 mg total) by mouth daily. 03/21/13 08/12/18 Yes Lewayne Bunting, MD  pantoprazole (PROTONIX) 40 MG tablet Take 40 mg by mouth daily.    Yes [provider]  potassium chloride (K-DUR) 10 MEQ tablet Take 10 mEq by mouth daily.   Yes [provider]  Cyanocobalamin (VITAMIN B12 SL) Place 1 tablet under the tongue daily.    [provider]  OXYGEN Inhale 2L for 8 hours at night.    [provider]    Allergies: Allergies  Allergen Reactions  . 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 Aspirin Other (See Comments)    Other Reaction: Other reaction  . Indocin [Indomethacin] Other (See Comments)  . Mobic [Meloxicam] Other (See Comments)  . Nsaids Other (See Comments)    Other Reaction: Other reaction Other Reaction: Other reaction  . Dairy Aid [Lactase] Rash    Social History:  reports that he quit smoking about 47 years ago. His smoking use included cigarettes. He has a 10.00 pack-year smoking history. He has never used smokeless tobacco. He reports current alcohol use. He reports that he does not use drugs.   Family History: Family History  Problem Relation Age of Onset  . Cancer Mother   . Lung cancer Mother   . Heart attack Father     Review of Systems: Review of Systems  Constitutional: Negative for chills and fever.  HENT: Negative for hearing loss.   Eyes: Negative for blurred vision.  Respiratory: Negative for shortness of breath.   Cardiovascular: Negative for chest pain.  Gastrointestinal: Positive for abdominal pain. Negative for diarrhea, nausea and vomiting.  Genitourinary: Negative for dysuria.  Musculoskeletal: Negative for myalgias.  Skin: Negative for rash.  Neurological: Negative for dizziness.  Psychiatric/Behavioral: Negative for depression.    Physical Exam BP 137/67 (BP Location: Left Arm)   Pulse 60   Temp  98.9 F (37.2 C) (Oral)   Resp 17   Ht 5\' 8"  (1.727 m)   Wt 87.5 kg   SpO2 96%   BMI 29.33 kg/m  CONSTITUTIONAL: No acute distress HEENT:  Normocephalic, atraumatic, extraocular motion intact. NECK: Trachea is midline, and there is no jugular venous distension.  RESPIRATORY:  Lungs are clear, and breath sounds are equal bilaterally. Normal respiratory effort without pathologic use of accessory muscles. CARDIOVASCULAR: Heart is regular without murmurs, gallops, or rubs. GI: The abdomen is soft, nondistended, with tenderness to palpation in the right lower quadrant towards the right flank.  MUSCULOSKELETAL:  Normal muscle strength and tone in all four extremities.  No peripheral edema or cyanosis. SKIN: Skin turgor is normal. There are no pathologic skin lesions.  NEUROLOGIC:  Motor and sensation is grossly normal.  Cranial nerves are grossly  intact. PSYCH:  Alert and oriented to person, place and time. Affect is normal.  Laboratory Analysis: Results for orders placed or performed during the hospital encounter of 08/12/18 (from the past 24 hour(s))  Lipase, blood     Status: None   Collection Time: 08/12/18  8:52 AM  Result Value Ref Range   Lipase 29 11 - 51 U/L  Comprehensive metabolic panel     Status: Abnormal   Collection Time: 08/12/18  8:52 AM  Result Value Ref Range   Sodium 138 135 - 145 mmol/L   Potassium 4.1 3.5 - 5.1 mmol/L   Chloride 103 98 - 111 mmol/L   CO2 29 22 - 32 mmol/L   Glucose, Bld 96 70 - 99 mg/dL   BUN 11 8 - 23 mg/dL   Creatinine, Ser 4.09 (H) 0.61 - 1.24 mg/dL   Calcium 8.6 (L) 8.9 - 10.3 mg/dL   Total Protein 6.3 (L) 6.5 - 8.1 g/dL   Albumin 3.4 (L) 3.5 - 5.0 g/dL   AST 20 15 - 41 U/L   ALT 20 0 - 44 U/L   Alkaline Phosphatase 50 38 - 126 U/L   Total Bilirubin 1.3 (H) 0.3 - 1.2 mg/dL   GFR calc non Af Amer 57 (L) >60 mL/min   GFR calc Af Amer >60 >60 mL/min   Anion gap 6 5 - 15  CBC     Status: Abnormal   Collection Time: 08/12/18  8:52 AM   Result Value Ref Range   WBC 12.6 (H) 4.0 - 10.5 K/uL   RBC 4.17 (L) 4.22 - 5.81 MIL/uL   Hemoglobin 13.4 13.0 - 17.0 g/dL   HCT 73.5 32.9 - 92.4 %   MCV 98.1 80.0 - 100.0 fL   MCH 32.1 26.0 - 34.0 pg   MCHC 32.8 30.0 - 36.0 g/dL   RDW 26.8 34.1 - 96.2 %   Platelets 217 150 - 400 K/uL   nRBC 0.0 0.0 - 0.2 %  Urinalysis, Complete w Microscopic     Status: Abnormal   Collection Time: 08/12/18  8:52 AM  Result Value Ref Range   Color, Urine YELLOW (A) YELLOW   APPearance CLEAR (A) CLEAR   Specific Gravity, Urine 1.018 1.005 - 1.030   pH 7.0 5.0 - 8.0   Glucose, UA NEGATIVE NEGATIVE mg/dL   Hgb urine dipstick NEGATIVE NEGATIVE   Bilirubin Urine NEGATIVE NEGATIVE   Ketones, ur NEGATIVE NEGATIVE mg/dL   Protein, ur 30 (A) NEGATIVE mg/dL   Nitrite NEGATIVE NEGATIVE   Leukocytes, UA NEGATIVE NEGATIVE   RBC / HPF 0-5 0 - 5 RBC/hpf   WBC, UA 0-5 0 - 5 WBC/hpf   Bacteria, UA NONE SEEN NONE SEEN   Squamous Epithelial / LPF NONE SEEN 0 - 5   Mucus PRESENT     Imaging: Ct Abdomen Pelvis W Contrast  Result Date: 08/12/2018 CLINICAL DATA:  Abdominal pain.  Right flank pain. EXAM: CT ABDOMEN AND PELVIS WITH CONTRAST TECHNIQUE: Multidetector CT imaging of the abdomen and pelvis was performed using the standard protocol following bolus administration of intravenous contrast. CONTRAST:  ISOVUE-300 IOPAMIDOL (ISOVUE-300) INJECTION 61% COMPARISON:  10/23/2017 FINDINGS: Lower chest: No acute abnormality. Hepatobiliary: No focal liver abnormality is seen. No gallstones, gallbladder wall thickening, or biliary dilatation. Pancreas: Unremarkable. No pancreatic ductal dilatation or surrounding inflammatory changes. Spleen: Normal in size without focal abnormality. Adrenals/Urinary Tract: Adrenal glands are unremarkable. Kidneys are normal, without renal calculi, focal lesion, or hydronephrosis. Bladder is  unremarkable. Stomach/Bowel: Small hiatal hernia. No bowel dilatation to suggest bowel  obstruction. Moderate amount of stool in the ascending colon. Dilated appendix measuring 12 mm with periappendiceal inflammatory changes. At the tip of the appendix there is a 2.9 x 2.7 cm complex fluid collection most consistent with an abscess. No pneumoperitoneum, pneumatosis or portal venous gas. Vascular/Lymphatic: Aortic atherosclerosis. No enlarged abdominal or pelvic lymph nodes. Reproductive: Prostate is unremarkable. Other: No abdominal wall hernia or abnormality. No abdominopelvic ascites. Musculoskeletal: No acute osseous abnormality. No aggressive osseous lesion. IMPRESSION: 1. Findings consistent with acute appendicitis. At the tip of the appendix there is a 2.9 x 2.7 cm complex fluid collection most consistent with an abscess. Electronically Signed   By: Elige KoHetal  [name]   On: 08/12/2018 10:31    Assessment and Plan: This is a 72 y.o. male with acute appendicitis.  Discussed CT scan with Dr. Fredia SorrowYamagata and we agree that there is no abscess as initial read reports.  Patient will be admitted to the surgical team, started on IV antibiotics, made NPO with IV fluid hydration, pain and nausea control as needed.  Discussed with the patient that we would take him today to the OR for a laparoscopic appendectomy.  Discussed with the patient the risks of bleeding, infection, injury to surrounding structures, the likelihood of leaving a drain in place, and he is willing to proceed.  Will add him on to the schedule today.  All of his questions have been answered.   Howie IllJose Luis Yalitza Teed, MD [facility]g:  [phone]

## 2018-08-12 NOTE — Anesthesia Post-op Follow-up Note (Signed)
Anesthesia QCDR form completed.        

## 2018-08-12 NOTE — ED Provider Notes (Signed)
[facility] Emergency Department Provider Note       Time seen: ----------------------------------------- 8:58 AM on 08/12/2018 -----------------------------------------   I have reviewed the triage vital signs and the nursing notes.  HISTORY   Chief Complaint Abdominal Pain    HPI [name] is a 72 y.o. male with a history of cardiomyopathy, CHF, GERD, hyperlipidemia, osteoarthritis who presents to the ED for right lower quadrant pain that began several nights ago.  [facility] to be [facility] for possible appendicitis.  Nothing makes his pain better, movement and pressure on the right side makes it worse.  Past Medical History:  Diagnosis Date  . Abnormal chest CT   . Anaphylactic reaction   . Benign hypertension   . Cardiomyopathy   . CHF (congestive heart failure) (HCC)   . Chronic renal insufficiency    With creatinine 1.4  . GERD (gastroesophageal reflux disease)   . Hives    Long Hx of hives  . Hyperlipidemia   . IMPLANTATION OF DEFIBRILLATOR, HX OF 02/15/2009   Qualifier: Diagnosis of  By: Jens Somrenshaw, MD, Lyn HollingsheadFACC, Brian Saunders   . NSVT (nonsustained ventricular tachycardia) (HCC)   . Osteoarthritis   . Osteoarthrosis, unspecified whether generalized or localized, lower leg   . Osteoporosis   . Oxygen deficiency   . Psoriasis   . PVC (premature ventricular contraction)   . 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 Thoracic Facet hypertrophy (Bilateral) 01/20/2018  . Thoracic facet syndrome (Bilateral) 01/20/2018  . 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 Thoracic radiculitis (Left) 01/20/2018  . Chronic upper back pain (Left) 01/20/2018  . Aortic atherosclerosis (HCC) 01/20/2018  . Lumbar facet arthropathy 01/20/2018  . Spondylosis without myelopathy or radiculopathy, thoracic region 01/20/2018  . Spondylosis without  myelopathy or radiculopathy, lumbosacral region 01/20/2018  . History of rib fracture 01/20/2018  . Diverticulosis, sigmoid 01/20/2018  . Cholelithiasis 01/20/2018  . Thoracic ascending aortic aneurysm (HCC) (4.2 cm) 01/20/2018  . Hx of total knee replacement (Right) 01/20/2018  . Chronic upper quadrant pain (LUQ) (Left) 01/20/2018  . Neurogenic pain 01/20/2018  . Chronic chest wall pain (Primary Area of Pain) (Left) 01/06/2018  . Chronic thoracic back pain (Secondary Area of Pain) (Left) 01/06/2018  . Positive Murphy's Sign 01/05/2018  . Chronic pain syndrome 01/05/2018  . Pharmacologic therapy 01/05/2018  . Disorder of skeletal system 01/05/2018  . Problems influencing health status 01/05/2018  . B12 deficiency 10/06/2016  . Medicare annual wellness visit, initial 10/06/2016  . Abdominal pain (Left) 04/20/2015  . CKD (chronic kidney disease) stage 3, GFR 30-59 ml/min (HCC) 09/18/2014  . Asthma 01/05/2014  . Bruit 08/29/2013  . 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 Chronic systolic heart failure (HCC) 09/09/2011  . Benign essential hypertension 02/15/2009  . Cardiomyopathy, nonischemic (HCC) 02/15/2009  . LUNG NODULE 02/15/2009  . DIZZINESS 02/15/2009  . Nonspecific (abnormal) findings on radiological and other examination of body structure 02/15/2009  . Nonspecific abnormal findings on radiological and other examination of skull and head 02/15/2009  . Combined hyperlipidemia 10/27/2008  . RHEUMATIC FEVER 10/27/2008  . Asymptomatic PVCs 10/27/2008  . Other osteoporosis 10/27/2008    Past Surgical History:  Procedure Laterality Date  . BIV ICD GENERTAOR CHANGE OUT N/A 10/08/2012   Procedure: BIV ICD GENERTAOR CHANGE OUT;  Surgeon: Marinus MawGregg W Taylor, MD;  [facility];  Service: Cardiovascular;  Laterality: N/A;  . CARDIAC CATHETERIZATION    . CARDIAC DEFIBRILLATOR REMOVAL  2014  . CATARACT EXTRACTION    . CATARACT EXTRACTION W/PHACO Left  03/12/2016   Procedure: CATARACT EXTRACTION PHACO AND INTRAOCULAR LENS PLACEMENT (IOC);  Surgeon: Sallee Lange, MD;  Location: [facility] ORS;  Service: Ophthalmology;  Laterality: Left;  [place] 01:32 AP% 25.7 CDE 41.33 fluid pack lot # [id number]  . Implantation of dual-chamber ICD.     St. Jude DDD-ICD 1/07  . KNEE ARTHROPLASTY Left 2006  . KNEE ARTHROPLASTY Right 12/10   x2  . TONSILLECTOMY      Allergies Ace inhibitors; Aspirin; Indocin [indomethacin]; Mobic [meloxicam]; Nsaids; and Dairy aid [lactase]  Social History Social History   Tobacco Use  . Smoking status: Former Smoker    Packs/day: 1.00    Years: 10.00    Pack years: 10.00    Types: Cigarettes    Last attempt to quit: 12/31/1970    Years since quitting: 47.6  . Smokeless tobacco: Never Used  Substance Use Topics  . Alcohol use: Yes    Alcohol/week: 0.0 standard drinks    Comment: 2 DRINKS PER DAY, none this week (7/26)  . Drug use: No   Review of Systems Constitutional: Negative for fever. Cardiovascular: Negative for chest pain. Respiratory: Negative for shortness of breath. Gastrointestinal: Positive for abdominal pain, negative for vomiting or diarrhea Musculoskeletal: Negative for back pain. Skin: Negative for rash. Neurological: Negative for headaches, focal weakness or numbness.  All systems negative/normal/unremarkable except as stated in the HPI  ____________________________________________   PHYSICAL EXAM:  VITAL SIGNS: ED Triage Vitals  Enc Vitals Group     BP 08/12/18 0845 132/63     Pulse Rate 08/12/18 0845 (!) 55     Resp 08/12/18 0845 18     Temp 08/12/18 0845 98.3 F (36.8 C)     Temp Source 08/12/18 0845 Oral     SpO2 08/12/18 0845 97 %     Weight 08/12/18 0845 193 lb (87.5 kg)     Height 08/12/18 0845 5\' 8"  (1.727 m)     Head Circumference --      Peak Flow --      Pain Score 08/12/18 0850 3     Pain Loc --      Pain Edu? --      Excl. in GC? --    Constitutional: Alert and  oriented. Well appearing and in no distress. Eyes: Conjunctivae are normal. Normal extraocular movements. ENT   Head: Normocephalic and atraumatic.   Nose: No congestion/rhinnorhea.   Mouth/Throat: Mucous membranes are moist.   Neck: No stridor. Cardiovascular: Normal rate, regular rhythm. No murmurs, rubs, or gallops. Respiratory: Normal respiratory effort without tachypnea nor retractions. Breath sounds are clear and equal bilaterally. No wheezes/rales/rhonchi. Gastrointestinal: Right lower quadrant and McBurney's point tenderness.  No rebound or guarding.  Normal bowel sounds. Musculoskeletal: Nontender with normal range of motion in extremities. No lower extremity tenderness nor edema. Neurologic:  Normal speech and language. No gross focal neurologic deficits are appreciated.  Skin:  Skin is warm, dry and intact. No rash noted. Psychiatric: Mood and affect are normal. Speech and behavior are normal.  ____________________________________________  ED COURSE:  As part of my medical decision making, I reviewed the following data within the electronic MEDICAL RECORD NUMBER History obtained from family if available, nursing notes, old chart and ekg, as well as notes from prior ED visits. Patient presented for right lower quadrant pain,  we will assess with labs and imaging as indicated at this time.   Procedures ____________________________________________   LABS (pertinent positives/negatives)  Labs Reviewed  COMPREHENSIVE METABOLIC PANEL - Abnormal; Notable for the following components:      Result Value   Creatinine, Ser 1.26 (*)    Calcium 8.6 (*)    Total Protein 6.3 (*)    Albumin 3.4 (*)    Total Bilirubin 1.3 (*)    GFR calc non Af Amer 57 (*)    All other components within normal limits  CBC - Abnormal; Notable for the following components:   WBC 12.6 (*)    RBC 4.17 (*)    All other components within normal limits  URINALYSIS, COMPLETE (UACMP) WITH MICROSCOPIC -  Abnormal; Notable for the following components:   Color, Urine YELLOW (*)    APPearance CLEAR (*)    Protein, ur 30 (*)    All other components within normal limits  LIPASE, BLOOD    RADIOLOGY Images were viewed by me  CT of the abdomen pelvis with contrast IMPRESSION: 1. Findings consistent with acute appendicitis. At the tip of the appendix there is a 2.9 x 2.7 cm complex fluid collection most consistent with an abscess. ____________________________________________  DIFFERENTIAL DIAGNOSIS   Appendicitis, UTI, renal colic, pyelonephritis, gas pain, obstruction  FINAL ASSESSMENT AND PLAN  Acute appendicitis   Plan: The patient had presented for right lower quadrant pain. Patient's labs did reveal mild leukocytosis but otherwise no acute process. Patient's imaging was indicative of acute appendicitis with complex fluid collection at the tip of the appendix.  I have ordered IV Zosyn for him.  I will discuss with general surgery for admission.   Ulice Dash, MD   Note: This note was generated in part or whole with voice recognition software. Voice recognition is usually quite accurate but there are transcription errors that can and very often do occur. I apologize for any typographical errors that were not detected and corrected.     Emily Filbert, MD 08/12/18 1038

## 2018-08-12 NOTE — ED Notes (Signed)
Pt given phone to call wife.

## 2018-08-12 NOTE — Op Note (Addendum)
  Procedure Date:  08/12/2018  Pre-operative Diagnosis:  Acute appendicitis  Post-operative Diagnosis:  Acute appendicitis  Procedure:  Laparoscopic appendectomy  Surgeon:  Howie Ill, MD  Assistant:  Hulda Marin, MD.  His assistance was needed as the appendix was very inflamed with concern for abscess on CT scan vs possible bulbous dilation of the tip.  There was concern for having to convert to an open procedure.  Anesthesia:  General endotracheal  Estimated Blood Loss:  15 ml  Specimens:  appendix  Complications:  None  Indications for Procedure:  This is a 72 y.o. male who presents with abdominal pain and workup revealing acute appendicitis with possible abscess.  The options of surgery versus observation were reviewed with the patient and/or family. The risks of bleeding, infection, recurrence of symptoms, negative laparoscopy, potential for an open procedure, bowel injury, abscess or infection, were all discussed with the patient and he was willing to proceed.  Description of Procedure: The patient was correctly identified in the preoperative area and brought into the operating room.  The patient was placed supine with VTE prophylaxis in place.  Appropriate time-outs were performed.  Anesthesia was induced and the patient was intubated.  Foley catheter was placed.  Appropriate antibiotics were infused.  The abdomen was prepped and draped in a sterile fashion. An infraumbilical incision was made. A cutdown technique was used to enter the abdominal cavity without injury, and a Hasson trocar was inserted.  Pneumoperitoneum was obtained with appropriate opening pressures.  Two 5-mm ports were placed in the suprapubic and left lateral positions under direct visualization.  The right lower quadrant was inspected and the appendix was identified in retrocecal position.  The appendix was carefully dissected off the lateral abdominal wall and colon using Ligasure and blunt dissection.   The mesoappendix was divided using the LigaSure.  In dissecting the appendix, an abscess cavity was entered revealing purulent fluid.  This was suctioned and irrigated.  It also appeared that the appendix tore midway through, and thus further dissection was done along the right colon until reaching the tip of the appendix.  The base of the appendix was dissected out and divided with a standard load Endo GIA.  The appendix via two specimens was placed in an Endocatch bag.  The right lower quadrant was then inspected again revealing an intact staple line, no bleeding, and no bowel injury.  The area was thoroughly irrigated.  A 19 Fr. Blake drain was placed via the left lateral port into the right lower quadrant along the area of dissection and staple line.  The 5 mm ports were removed under direct visualization and the Hasson trocar was removed.  The Endocatch bag was brought out through the umbilical incision.  The fascial opening was closed using 0 vicryl suture.  Local anesthetic was infused in all incisions and the incisions were closed with 3-0 Vicryl and 4-0 Monocryl.  The drain site was secured using 3-0 Nylon.  The wounds were cleaned and sealed with DermaBond, and the drain site was dressed with 4x4 gauze and Tegaderm.  Foley catheter was removed and the patient was emerged from anesthesia and extubated and brought to the recovery room for further management.  The patient tolerated the procedure well and all counts were correct at the end of the case.   Howie Ill, MD

## 2018-08-12 NOTE — Anesthesia Procedure Notes (Signed)
Procedure Name: Intubation Date/Time: 08/12/2018 6:34 PM Performed by: Aline Brochure, CRNA Pre-anesthesia Checklist: Patient identified, Emergency Drugs available, Suction available and Patient being monitored Patient Re-evaluated:Patient Re-evaluated prior to induction Oxygen Delivery Method: Circle system utilized Preoxygenation: Pre-oxygenation with 100% oxygen Induction Type: IV induction, Cricoid Pressure applied and Rapid sequence Laryngoscope Size: Mac and 4 Grade View: Grade I Tube type: Oral Tube size: 7.5 mm Number of attempts: 1 Airway Equipment and Method: Stylet Placement Confirmation: ETT inserted through vocal cords under direct vision,  positive ETCO2 and breath sounds checked- equal and bilateral Secured at: 23 cm Tube secured with: Tape Dental Injury: Teeth and Oropharynx as per pre-operative assessment

## 2018-08-13 ENCOUNTER — Encounter: Payer: Self-pay | Admitting: Surgery

## 2018-08-13 LAB — CBC WITH DIFFERENTIAL/PLATELET
Abs Immature Granulocytes: 0.07 10*3/uL (ref 0.00–0.07)
BASOS ABS: 0 10*3/uL (ref 0.0–0.1)
Basophils Relative: 0 %
Eosinophils Absolute: 0 10*3/uL (ref 0.0–0.5)
Eosinophils Relative: 0 %
HCT: 38.7 % — ABNORMAL LOW (ref 39.0–52.0)
Hemoglobin: 12.7 g/dL — ABNORMAL LOW (ref 13.0–17.0)
IMMATURE GRANULOCYTES: 1 %
Lymphocytes Relative: 2 %
Lymphs Abs: 0.2 10*3/uL — ABNORMAL LOW (ref 0.7–4.0)
MCH: 32.2 pg (ref 26.0–34.0)
MCHC: 32.8 g/dL (ref 30.0–36.0)
MCV: 98.2 fL (ref 80.0–100.0)
Monocytes Absolute: 0.2 10*3/uL (ref 0.1–1.0)
Monocytes Relative: 1 %
NRBC: 0 % (ref 0.0–0.2)
Neutro Abs: 10.8 10*3/uL — ABNORMAL HIGH (ref 1.7–7.7)
Neutrophils Relative %: 96 %
Platelets: 197 10*3/uL (ref 150–400)
RBC: 3.94 MIL/uL — ABNORMAL LOW (ref 4.22–5.81)
RDW: 12.7 % (ref 11.5–15.5)
WBC: 11.3 10*3/uL — ABNORMAL HIGH (ref 4.0–10.5)

## 2018-08-13 LAB — BASIC METABOLIC PANEL
Anion gap: 11 (ref 5–15)
BUN: 15 mg/dL (ref 8–23)
CO2: 21 mmol/L — ABNORMAL LOW (ref 22–32)
Calcium: 8.1 mg/dL — ABNORMAL LOW (ref 8.9–10.3)
Chloride: 106 mmol/L (ref 98–111)
Creatinine, Ser: 1.25 mg/dL — ABNORMAL HIGH (ref 0.61–1.24)
GFR calc Af Amer: >60 mL/min (ref >60)
GFR calc non Af Amer: 58 mL/min — ABNORMAL LOW (ref >60)
GLUCOSE: 124 mg/dL — AB (ref 70–99)
Potassium: 4.6 mmol/L (ref 3.5–5.1)
Sodium: 138 mmol/L (ref 135–145)

## 2018-08-13 LAB — MAGNESIUM: Magnesium: 2 mg/dL (ref 1.7–2.4)

## 2018-08-13 MED ORDER — OXYCODONE HCL 5 MG PO TABS: 5.0000 mg | ORAL_TABLET | ORAL | 0 refills | Status: DC | PRN

## 2018-08-13 MED ORDER — SODIUM CHLORIDE 0.9 % IV SOLN
INTRAVENOUS | Status: DC | PRN
  Administered 2018-08-13: 1000 mL via INTRAVENOUS

## 2018-08-13 MED ORDER — ACETAMINOPHEN 500 MG PO TABS: 1000.0000 mg | ORAL_TABLET | Freq: Four times a day (QID) | ORAL | 0 refills | Status: DC | PRN

## 2018-08-13 MED ORDER — AMOXICILLIN-POT CLAVULANATE 875-125 MG PO TABS: 1.0000 | ORAL_TABLET | Freq: Two times a day (BID) | ORAL | 0 refills | Status: AC

## 2018-08-13 MED ORDER — PANTOPRAZOLE SODIUM 40 MG PO TBEC: 40.0000 mg | DELAYED_RELEASE_TABLET | Freq: Every day | ORAL | Status: DC

## 2018-08-13 NOTE — Progress Notes (Signed)
Nutrition Brief Note  Patient identified on the Malnutrition Screening Tool (MST) Report  72 y.o. male with acute appendicitis  Met with pt in room today. Pt reports poor appetite and oral intake for 2 days pta r/t abdominal pain and nausea. Pt reports his appetite is improving today. Pt ate 100% of his clear liquid tray for breakfast and ate 75% of beef pot roast and rice for lunch today. RD educated pt regarding adequate nutrition needed for post op healing. Pt reports he has Ensure at home and likes this. Pt to possibly discharge today or tomorrow per his report.   Wt Readings from Last 15 Encounters:  08/12/18 87.5 kg  05/27/18 87.2 kg  05/10/18 88.5 kg  05/09/18 88.5 kg  02/24/18 88.5 kg  02/04/18 83.9 kg  01/21/18 85.7 kg  01/20/18 83.9 kg  01/05/18 84.4 kg  12/23/17 84.4 kg  11/23/17 86.1 kg  11/12/17 87.1 kg  05/01/17 90.3 kg  10/21/16 98.4 kg  05/15/16 98 kg    Body mass index is 29.33 kg/m. Patient meets criteria for overweight based on current BMI. Per chart, pt is weight stable pta.   Current diet order is heart healthy, patient is consuming approximately 75-100% of meals at this time. Labs and medications reviewed.   No nutrition interventions warranted at this time. If nutrition issues arise, please consult RD.   Koleen Distance MS, RD, LDN Pager #- [phone] Office#- [phone] After Hours Pager: [phone]

## 2018-08-13 NOTE — Discharge Summary (Signed)
Patient ID: [name] MRN: [id number] DOB/AGE: 12/31/1945 72 y.o.  Admit date: 08/12/2018 Discharge date: 08/13/2018   Discharge Diagnoses:  Active Problems:   Acute appendicitis   Procedures:  Laparoscopic appendectomy  Hospital Course:   Patient was admitted on 12/26 with acute appendicitis and taken to OR for laparoscopic appendectomy. Drain was left in place.  Post-op patient did well, advanced diet as tolerated, was ambulating, denied any significant pain, with drain working well with serosanguinous fluid.  On exam, was in no acute distress with stable vital signs.  Abdomen was soft, nondistended, appropriately tender to palpation.  Incisions were clean, dry, intact.  Drain in place with serosanguinous fluid.  Consults: None  Disposition: Discharge disposition: 01-Home or Self Care       Discharge Instructions    Call MD for:  difficulty breathing, headache or visual disturbances   Complete by:  As directed    Call MD for:  persistant nausea and vomiting   Complete by:  As directed    Call MD for:  redness, tenderness, or signs of infection (pain, swelling, redness, odor or green/yellow discharge around incision site)   Complete by:  As directed    Call MD for:  severe uncontrolled pain   Complete by:  As directed    Call MD for:  temperature >100.4   Complete by:  As directed    Change dressing (specify)   Complete by:  As directed    Change dressing to drain site once daily with dry gauze and tape.   Diet - low sodium heart healthy   Complete by:  As directed    Discharge instructions   Complete by:  As directed    1.  Patient may shower, but do not scrub wounds heavily and dab dry only. 2.  Do not submerge wounds in pool/tub for 1 week. 3.  Do not apply ointments or hydrogen peroxide to the wounds. 4.  Empty and record drain output daily.  Bring record with you to office.   Driving Restrictions   Complete by:  As directed    Do not drive while taking  narcotics for pain control.   Increase activity slowly   Complete by:  As directed    Lifting restrictions   Complete by:  As directed    No heavy lifting or pushing of more than 10-15 lbs for 4 weeks.     Allergies as of 08/13/2018      Reactions   Ace Inhibitors Other (See Comments)   Other Reaction: Other reaction/ Urticaria secondary to COX-1 inhibitors Angioedema   Aspirin Other (See Comments)   Other Reaction: Other reaction   Indocin [indomethacin] Other (See Comments)   Mobic [meloxicam] Other (See Comments)   Nsaids Other (See Comments)   Other Reaction: Other reaction Other Reaction: Other reaction   Dairy Aid [lactase] Rash      Medication List    TAKE these medications   acetaminophen 500 MG tablet Commonly known as:  TYLENOL Take 2 tablets (1,000 mg total) by mouth every 6 (six) hours as needed for mild pain.   amiodarone 200 MG tablet Commonly known as:  PACERONE TAKE 1 TABLET BY MOUTH EVERY DAY   amoxicillin-clavulanate 875-125 MG tablet Commonly known as:  AUGMENTIN Take 1 tablet by mouth 2 (two) times daily for 10 days.   atorvastatin 40 MG tablet Commonly known as:  LIPITOR TAKE 1/2 TABLET (20 MG TOTAL) BY MOUTH DAILY. What changed:  See the new  instructions.   carvedilol 25 MG tablet Commonly known as:  COREG TAKE 2 TABLETS (50 MG TOTAL) BY MOUTH 2 (TWO) TIMES DAILY WITH A MEAL.   diphenhydrAMINE 25 mg capsule Commonly known as:  BENADRYL Take 25 mg by mouth every 6 (six) hours as needed.   furosemide 20 MG tablet Commonly known as:  LASIX TAKE 2 TABLETS (40 MG TOTAL) BY MOUTH DAILY. TAKE AN EXTRA 20MG  DAILY AS NEEDED   gabapentin 100 MG capsule Commonly known as:  NEURONTIN 1 cap TID and 3 cap(s) at HS. Max: 5/day What changed:    how much to take  how to take this  when to take this  additional instructions   hydrALAZINE 50 MG tablet Commonly known as:  APRESOLINE TAKE 1 & 1/2 TABLET BY MOUTH 3 TIMES DAILY What changed:  See  the new instructions.   isosorbide mononitrate 30 MG 24 hr tablet Commonly known as:  IMDUR Take 1 tablet (30 mg total) by mouth daily.   oxyCODONE 5 MG immediate release tablet Commonly known as:  Oxy IR/ROXICODONE Take 1 tablet (5 mg total) by mouth every 4 (four) hours as needed for moderate pain or severe pain.   OXYGEN Inhale 2L for 8 hours at night.   pantoprazole 40 MG tablet Commonly known as:  PROTONIX Take 40 mg by mouth daily.   potassium chloride 10 MEQ tablet Commonly known as:  K-DUR Take 10 mEq by mouth daily.   VITAMIN B12 SL Place 1 tablet under the tongue daily.            Discharge Care Instructions  (From admission, onward)         Start     Ordered   08/13/18 0000  Change dressing (specify)    Comments:  Change dressing to drain site once daily with dry gauze and tape.   08/13/18 1450         Follow-up Information    Henrene Dodge, MD Follow up on 08/16/2018.   Specialty:  General Surgery Why:  Follow up Monday 12/30 in afternoon for drain removal. Contact information: [address] [place] [address] [phone]

## 2018-08-13 NOTE — Progress Notes (Signed)
Discharge instructions reviewed with patient including followup visits, surgical drain care and management and new medications.  Understanding was verbalized and all questions were answered.  IV removed without complication; patient tolerated well.  Patient discharged home via wheelchair in stable condition escorted by nursing staff.

## 2018-08-13 NOTE — Anesthesia Postprocedure Evaluation (Signed)
Anesthesia Post Note  Patient: [name]  Procedure(s) Performed: APPENDECTOMY LAPAROSCOPIC (N/A )  Patient location during evaluation: PACU Anesthesia Type: General Level of consciousness: awake and alert and oriented Pain management: pain level controlled Vital Signs Assessment: post-procedure vital signs reviewed and stable Respiratory status: spontaneous breathing Cardiovascular status: blood pressure returned to baseline Anesthetic complications: no     Last Vitals:  Vitals:   08/12/18 2355 08/13/18 0505  BP: 127/66 110/60  Pulse:  (!) 57  Resp: 18 16  Temp:  36.6 C  SpO2:  95%    Last Pain:  Vitals:   08/13/18 0600  TempSrc:   PainSc: 1                  Keylie Beavers

## 2018-08-16 ENCOUNTER — Other Ambulatory Visit: Payer: Self-pay

## 2018-08-16 ENCOUNTER — Ambulatory Visit (INDEPENDENT_AMBULATORY_CARE_PROVIDER_SITE_OTHER): Payer: Medicare Other | Admitting: Surgery

## 2018-08-16 ENCOUNTER — Encounter: Payer: Self-pay | Admitting: Surgery

## 2018-08-16 VITALS — BP 136/69 | HR 57 | Temp 97.9°F | Resp 14 | Ht 68.0 in | Wt 194.8 lb

## 2018-08-16 DIAGNOSIS — K3580 Unspecified acute appendicitis: Secondary | ICD-10-CM

## 2018-08-16 DIAGNOSIS — Z09 Encounter for follow-up examination after completed treatment for conditions other than malignant neoplasm: Secondary | ICD-10-CM

## 2018-08-16 LAB — SURGICAL PATHOLOGY

## 2018-08-16 NOTE — Progress Notes (Signed)
08/16/2018  HPI: MALI JUAIRE is a 72 y.o. male s/p laparoscopic appendectomy on 12/26.  He presents for follow up.  Drain remains in place.  He's draining about 30-40 ml of serosanguinous fluid.  Denies any significant pain, but mostly soreness around incisions and right flank (appendix was retrocecal).  Vital signs: BP 136/69   Pulse (!) 57   Temp 97.9 F (36.6 C)   Resp 14   Ht 5\' 8"  (1.727 m)   Wt 194 lb 12.8 oz (88.4 kg)   BMI 29.62 kg/m    Physical Exam: Constitutional: No acute events Abdomen:  Abdomen is soft, non-distended, appropriately tender to palpation.  Incisions are clean, dry, intact.  There is some ecchymosis at the umbilical incision.  His Blake drain has serosanguinous fluid.  10 ml emptied in office.  Assessment/Plan: This is a 72 y.o. male s/p laparoscopic appendectomy.  --Reviewed pathology results with patient.  Acute appendicitis with serositis.  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  No further resection is needed. --Given that his drain output is still high, will keep drain and have him follow up on 1/2 with Dr. Lady Gary for drain removal. --He will then follow up with me in 2-3 weeks to check on his progress and healing.   Howie Ill, MD [facility]

## 2018-08-19 ENCOUNTER — Other Ambulatory Visit: Payer: Self-pay

## 2018-08-19 ENCOUNTER — Encounter: Payer: Self-pay | Admitting: General Surgery

## 2018-08-19 ENCOUNTER — Ambulatory Visit: Payer: Medicare Other | Admitting: General Surgery

## 2018-08-19 VITALS — BP 127/69 | HR 83 | Temp 97.8°F | Resp 18 | Ht 68.0 in | Wt 193.0 lb

## 2018-08-19 DIAGNOSIS — Z9049 Acquired absence of other specified parts of digestive tract: Secondary | ICD-10-CM

## 2018-08-19 NOTE — Progress Notes (Signed)
Patient of Dr. Adelene Idler.  He underwent a laparoscopic appendectomy and had a drain placed.  His visit with Dr. Aleen Campi on 30 December, the drain was still putting out between 30 to 21mL daily.  Because of the higher output, the decision was made to leave the drain in and have him follow-up with me today to determine whether or not removal was appropriate.  He has been doing well at home and brought his drain output sheet with him.  It appears that the drain has been putting out between 25 and 30 cc/day.  He reported 15 cc out this morning.  There is another 15 cc in the bulb now.  Fevers or chills.  He continues to endorse some soreness around his right flank.  According to Dr. Adelene Idler note the appendix was retrocecal.  Vitals:   08/19/18 1033  BP: 127/69  Pulse: 83  Resp: 18  Temp: 97.8 F (36.6 C)  SpO2: 98%   Gen: NAD, A&O x 3 Abdomen: Abdomen is soft, nondistended.  It is appropriately tender to palpation.  The incisions are well approximated without erythema induration or drainage.  He does have some ecchymosis around each of the port sites.  Is serosanguineous fluid in his drain.  As stated above an additional 15 cc was emptied in the office.  Assessment and plan: This is a 73 year old patient of Dr. Adelene Idler who underwent a laparoscopic appendectomy on 12 August 2018.  Overall he is doing well however there is still drain output over 30 cc/day.  It is still a bit high for removal.  We will have him follow-up in Dr. Adelene Idler clinic at the first of next week.

## 2018-08-19 NOTE — Patient Instructions (Signed)
Please continue to monitor the drain output.  Please see your follow up appointment listed below.

## 2018-08-24 ENCOUNTER — Other Ambulatory Visit: Payer: Self-pay

## 2018-08-24 ENCOUNTER — Encounter: Payer: Self-pay | Admitting: Surgery

## 2018-08-24 ENCOUNTER — Ambulatory Visit: Payer: Medicare Other | Admitting: Surgery

## 2018-08-24 VITALS — BP 129/75 | HR 62 | Temp 97.5°F | Resp 18 | Ht 68.0 in | Wt 193.0 lb

## 2018-08-24 DIAGNOSIS — K3580 Unspecified acute appendicitis: Secondary | ICD-10-CM

## 2018-08-24 DIAGNOSIS — Z09 Encounter for follow-up examination after completed treatment for conditions other than malignant neoplasm: Secondary | ICD-10-CM

## 2018-08-24 NOTE — Progress Notes (Signed)
08/24/2018  HPI: [name] is a 73 y.o. male s/p laparoscopic appendectomy on 12/26.  Drain was left in place.  Still had been draining serosanguinous fluid and drain had remained in place.  Presents today for further follow up.  Denies any worsening pain and reports drainage has decreased to less than 20 cc per day.  Vital signs: BP 129/75   Pulse 62   Temp (!) 97.5 F (36.4 C) (Other (Comment))   Resp 18   Ht 5\' 8"  (1.727 m)   Wt 193 lb (87.5 kg)   SpO2 98%   BMI 29.35 kg/m    Physical Exam: Constitutional: No acute distress Abdomen:  Soft, nondistended, nontender to palpation.  Incisions are clean, dry, intact.  Drain in place with serosanguinous fluid.  Drain removed at bedside and dressed with 4x4 gauze and tape  Assessment/Plan: This is a 73 y.o. male s/p laparoscopic appendectomy.  --reviewed pathology with patient again, showing acute appendicitis with serositis and a sessile serrated adenoma within the lumen, but no evidence of high grade dysplasia or malignancy. --drain removed and dressed with gauze/tape.  Patient instructed to change dressing daily until wound closed. --follow up in two weeks.   Howie Ill, MD [facility]

## 2018-08-24 NOTE — Patient Instructions (Signed)
Please keep a dressing over the drain site until healed completely.  Please see your follow up appointment listed below.

## 2018-09-01 ENCOUNTER — Ambulatory Visit: Payer: Medicare Other | Admitting: Surgery

## 2018-09-05 ENCOUNTER — Encounter: Payer: Self-pay | Admitting: Medical Oncology

## 2018-09-05 ENCOUNTER — Emergency Department
Admission: EM | Admit: 2018-09-05 | Discharge: 2018-09-05 | Disposition: A | Discharge status: Home / Self Care | Payer: Medicare Other | Attending: Emergency Medicine | Admitting: Emergency Medicine

## 2018-09-05 DIAGNOSIS — Z87891 Personal history of nicotine dependence: Secondary | ICD-10-CM | POA: Insufficient documentation

## 2018-09-05 DIAGNOSIS — Z96652 Presence of left artificial knee joint: Secondary | ICD-10-CM | POA: Diagnosis not present

## 2018-09-05 DIAGNOSIS — Z96651 Presence of right artificial knee joint: Secondary | ICD-10-CM | POA: Insufficient documentation

## 2018-09-05 DIAGNOSIS — R21 Rash and other nonspecific skin eruption: Secondary | ICD-10-CM | POA: Insufficient documentation

## 2018-09-05 DIAGNOSIS — N183 Chronic kidney disease, stage 3 (moderate): Secondary | ICD-10-CM | POA: Diagnosis not present

## 2018-09-05 DIAGNOSIS — I130 Hypertensive heart and chronic kidney disease with heart failure and stage 1 through stage 4 chronic kidney disease, or unspecified chronic kidney disease: Secondary | ICD-10-CM | POA: Diagnosis not present

## 2018-09-05 DIAGNOSIS — I5022 Chronic systolic (congestive) heart failure: Secondary | ICD-10-CM | POA: Insufficient documentation

## 2018-09-05 DIAGNOSIS — Z79899 Other long term (current) drug therapy: Secondary | ICD-10-CM | POA: Insufficient documentation

## 2018-09-05 MED ORDER — DIPHENHYDRAMINE HCL 25 MG PO CAPS
50.0000 mg | ORAL_CAPSULE | Freq: Once | ORAL | Status: AC
  Administered 2018-09-05: 50 mg via ORAL
  Filled 2018-09-05: qty 2

## 2018-09-05 MED ORDER — PREDNISONE 10 MG PO TABS: ORAL_TABLET | ORAL | 0 refills | Status: DC

## 2018-09-05 MED ORDER — METHYLPREDNISOLONE SODIUM SUCC 125 MG IJ SOLR
125.0000 mg | Freq: Once | INTRAMUSCULAR | Status: AC
  Administered 2018-09-05: 125 mg via INTRAMUSCULAR
  Filled 2018-09-05: qty 2

## 2018-09-05 NOTE — ED Notes (Signed)
Pt has chronic rash, has been to allergist unable to determine cause. Pt taking 50mg  Benadryl every 4 hours per advice from allergist. No difficulty breathing, pt states rash has improved since arriving to ED.

## 2018-09-05 NOTE — ED Notes (Addendum)
Pt out to nurse's station states "i'm going to leave, my hives are getting better"  Morrie Sheldon PA-C in room now to talk to the patient.  No distress noted.

## 2018-09-05 NOTE — ED Triage Notes (Signed)
Pt reports that he began last night having rash to trunk and arm. Pt states that rash is itchy. Pt began taking 50mg  benadryl around 6pm last night ever 6 hrs and there has been no improvement. Pt denies any sob.

## 2018-09-05 NOTE — Discharge Instructions (Signed)
Please follow-up with Dr. Hyacinth Meeker this week to further discuss the medications you are on and see if any of them may be causing this reaction.  I have also given you a referral for a Bridger allergy.

## 2018-09-05 NOTE — ED Notes (Signed)
Encouraged pt to follow up with PCP and to follow up across the street at the [facility] to speak with pharmacist about possible interactions from medications that could be the cause of his continued to rash.

## 2018-09-05 NOTE — ED Provider Notes (Signed)
[facility] Emergency Department Provider Note  ____________________________________________  Time seen: Approximately 10:09 AM  I have reviewed the triage vital signs and the nursing notes.   HISTORY  Chief Complaint Rash    HPI [name] is a 73 y.o. male that presents emergency department for concern of allergic reaction.  Patient states that he has had a rash on and off for almost the last year that worsened 4 months ago.  Patient states that he had an anaphylactic reaction and was hospitalized.  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  He has not had any dairy since September.  He has continued taking Benadryl daily since this anaphylactic reaction.  His rash continues to flare from time to time.  Rash seemed worse this morning.  Rash is itchy.  No history of autoimmune diseases.  Patient has not started any new medications recently.  He received all of his childhood vaccines.  Patient had an appendectomy 1 month ago and had blood work completed at this time.  No fever, throat tingling, SOB, CP.   Past Medical History:  Diagnosis Date  . Abnormal chest CT   . Anaphylactic reaction   . Anxiety   . Benign hypertension   . Cardiomyopathy   . CHF (congestive heart failure) (HCC)   . Chronic renal insufficiency    With creatinine 1.4  . GERD (gastroesophageal reflux disease)   . Hives    Long Hx of hives  . Hyperlipidemia   . IMPLANTATION OF DEFIBRILLATOR, HX OF 02/15/2009   Qualifier: Diagnosis of  By: Jens Som, MD, Lyn Hollingshead   . NSVT (nonsustained ventricular tachycardia) (HCC)   . Osteoarthritis   . Osteoarthrosis, unspecified whether generalized or localized, lower leg   . Osteoporosis   . Oxygen deficiency   . Psoriasis   . PVC (premature ventricular contraction)   . 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 Acute appendicitis  08/12/2018  . Thoracic Facet hypertrophy (Bilateral) 01/20/2018  . Thoracic facet syndrome (Bilateral) 01/20/2018  . 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 Thoracic radiculitis (Left) 01/20/2018  . Chronic upper back pain (Left) 01/20/2018  . Aortic atherosclerosis (HCC) 01/20/2018  . Lumbar facet arthropathy 01/20/2018  . Spondylosis without myelopathy or radiculopathy, thoracic region 01/20/2018  . Spondylosis without myelopathy or radiculopathy, lumbosacral region 01/20/2018  . History of rib fracture 01/20/2018  . Diverticulosis, sigmoid 01/20/2018  . Cholelithiasis 01/20/2018  . Thoracic ascending aortic aneurysm (HCC) (4.2 cm) 01/20/2018  . Hx of total knee replacement (Right) 01/20/2018  . Chronic upper quadrant pain (LUQ) (Left) 01/20/2018  . Neurogenic pain 01/20/2018  . Chronic chest wall pain (Primary Area of Pain) (Left) 01/06/2018  . Chronic thoracic back pain (Secondary Area of Pain) (Left) 01/06/2018  . Positive Murphy's Sign 01/05/2018  . Chronic pain syndrome 01/05/2018  . Pharmacologic therapy 01/05/2018  . Disorder of skeletal system 01/05/2018  . Problems influencing health status 01/05/2018  . B12 deficiency 10/06/2016  . Medicare annual wellness visit, initial 10/06/2016  . Abdominal pain (Left) 04/20/2015  . CKD (chronic kidney disease) stage 3, GFR 30-59 ml/min (HCC) 09/18/2014  . Asthma 01/05/2014  . Bruit 08/29/2013  . 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 Chronic systolic heart failure (HCC) 09/09/2011  . Benign essential hypertension 02/15/2009  . Cardiomyopathy, nonischemic (HCC) 02/15/2009  . LUNG NODULE  02/15/2009  . DIZZINESS 02/15/2009  . Nonspecific (abnormal) findings on radiological and other examination of body structure 02/15/2009  . Nonspecific abnormal findings on radiological and other examination of skull and head 02/15/2009  . Combined hyperlipidemia 10/27/2008  . RHEUMATIC FEVER  10/27/2008  . Asymptomatic PVCs 10/27/2008  . Other osteoporosis 10/27/2008    Past Surgical History:  Procedure Laterality Date  . BIV ICD GENERTAOR CHANGE OUT N/A 10/08/2012   Procedure: BIV ICD GENERTAOR CHANGE OUT;  Surgeon: Marinus Maw, MD;  Location: [facility] CATH LAB;  Service: Cardiovascular;  Laterality: N/A;  . CARDIAC CATHETERIZATION    . CARDIAC DEFIBRILLATOR REMOVAL  2014  . CATARACT EXTRACTION    . CATARACT EXTRACTION W/PHACO Left 03/12/2016   Procedure: CATARACT EXTRACTION PHACO AND INTRAOCULAR LENS PLACEMENT (IOC);  Surgeon: Sallee Lange, MD;  Location: [facility] ORS;  Service: Ophthalmology;  Laterality: Left;  [place] 01:32 AP% 25.7 CDE 41.33 fluid pack lot # [id number]  . Implantation of dual-chamber ICD.     [employer] Jude DDD-ICD 1/07  . KNEE ARTHROPLASTY Left 2006  . KNEE ARTHROPLASTY Right 12/10   x2  . LAPAROSCOPIC APPENDECTOMY N/A 08/12/2018   Procedure: APPENDECTOMY LAPAROSCOPIC;  Surgeon: Henrene Dodge, MD;  Location: [facility] ORS;  Service: General;  Laterality: N/A;  . TONSILLECTOMY      Prior to Admission medications   Medication Sig Start Date End Date Taking? Authorizing Provider  acetaminophen (TYLENOL) 500 MG tablet Take 2 tablets (1,000 mg total) by mouth every 6 (six) hours as needed for mild pain. 08/13/18   Henrene Dodge, MD  amiodarone (PACERONE) 200 MG tablet TAKE 1 TABLET BY MOUTH EVERY DAY Patient taking differently: Take 200 mg by mouth daily.  08/12/18   Lewayne Bunting, MD  atorvastatin (LIPITOR) 40 MG tablet TAKE 1/2 TABLET (20 MG TOTAL) BY MOUTH DAILY. Patient taking differently: Take 20 mg by mouth daily at 6 PM.  02/16/18   Lewayne Bunting, MD  carvedilol (COREG) 25 MG tablet TAKE 2 TABLETS (50 MG TOTAL) BY MOUTH 2 (TWO) TIMES DAILY WITH A MEAL. 02/15/18   Lewayne Bunting, MD  Cyanocobalamin (VITAMIN B12 SL) Place 1 tablet under the tongue daily.    [provider]  diphenhydrAMINE (BENADRYL) 25 mg capsule Take 25 mg by mouth every 6 (six)  hours as needed.    [provider]  furosemide (LASIX) 20 MG tablet TAKE 2 TABLETS (40 MG TOTAL) BY MOUTH DAILY. TAKE AN EXTRA 20MG  DAILY AS NEEDED 08/07/16   Rollene Rotunda, MD  gabapentin (NEURONTIN) 100 MG capsule 1 cap TID and 3 cap(s) at HS. Max: 5/day Patient taking differently: Take 100 mg by mouth See admin instructions. Take one capsule at breakfast, take one capsule at lunch, and take three capsules at bedtime 02/24/18   Delano Metz, MD  hydrALAZINE (APRESOLINE) 50 MG tablet TAKE 1 & 1/2 TABLET BY MOUTH 3 TIMES DAILY Patient taking differently: Take 50 mg by mouth 2 (two) times daily. Take 1 & 1/2 tablet by mouth 3 times daily 08/03/17   Lewayne Bunting, MD  isosorbide mononitrate (IMDUR) 30 MG 24 hr tablet Take 1 tablet (30 mg total) by mouth daily. 03/21/13 08/12/18  Lewayne Bunting, MD  OXYGEN Inhale 2L for 8 hours at night.    [provider]  pantoprazole (PROTONIX) 40 MG tablet Take 40 mg by mouth daily.     [provider]  potassium chloride (K-DUR) 10 MEQ tablet Take 10 mEq by mouth daily.  [provider]  predniSONE (DELTASONE) 10 MG tablet Take 6 tablets on day 1, take 5 tablets on day 2, take 4 tablets on day 3, take 3 tablets on day 4, take 2 tablets on day 5, take 1 tablet on day 6 09/05/18   Enid DerryWagner, Jaeanna Mccomber, PA-C    Allergies Ace inhibitors; Aspirin; Indocin [indomethacin]; Mobic [meloxicam]; Nsaids; and Dairy aid [lactase]  Family History  Problem Relation Age of Onset  . Cancer Mother   . Lung cancer Mother   . Heart attack Father     Social History Social History   Tobacco Use  . Smoking status: Former Smoker    Packs/day: 1.00    Years: 10.00    Pack years: 10.00    Types: Cigarettes    Last attempt to quit: 12/31/1970    Years since quitting: 47.7  . Smokeless tobacco: Never Used  Substance Use Topics  . Alcohol use: Yes    Alcohol/week: 0.0 standard drinks    Comment: 2 DRINKS PER DAY, none this week  (7/26)  . Drug use: No     Review of Systems  Constitutional: No fever/chills ENT: No upper respiratory complaints. Cardiovascular: No chest pain. Respiratory: No cough. No SOB. Gastrointestinal: No abdominal pain.  No nausea, no vomiting.  Musculoskeletal: Negative for musculoskeletal pain. Skin: Negative for abrasions, lacerations, ecchymosis.  Positive for rash. Neurological: Negative for headaches, numbness or tingling   ____________________________________________   PHYSICAL EXAM:  VITAL SIGNS: ED Triage Vitals  Enc Vitals Group     BP 09/05/18 0909 (!) 150/78     Pulse Rate 09/05/18 0909 61     Resp 09/05/18 0909 18     Temp 09/05/18 0909 97.7 F (36.5 C)     Temp Source 09/05/18 0909 Oral     SpO2 09/05/18 0909 97 %     Weight 09/05/18 0908 191 lb 12.8 oz (87 kg)     Height 09/05/18 0908 5\' 8"  (1.727 m)     Head Circumference --      Peak Flow --      Pain Score 09/05/18 0908 0     Pain Loc --      Pain Edu? --      Excl. in GC? --      Constitutional: Alert and oriented. Well appearing and in no acute distress. Eyes: Conjunctivae are normal. PERRL. EOMI. Head: Atraumatic. ENT:      Ears:      Nose: No congestion/rhinnorhea.      Mouth/Throat: Mucous membranes are moist.  Neck: No stridor.   Cardiovascular: Normal rate, regular rhythm.  Good peripheral circulation. Respiratory: Normal respiratory effort without tachypnea or retractions. Lungs CTAB. Good air entry to the bases with no decreased or absent breath sounds. Musculoskeletal: Full range of motion to all extremities. No gross deformities appreciated. Neurologic:  Normal speech and language. No gross focal neurologic deficits are appreciated.  Skin:  Skin is warm, dry.  1/2 cm red macules to chest, abdomen, bilateral arms.  No rash to legs or back. Psychiatric: Mood and affect are normal. Speech and behavior are normal. Patient exhibits appropriate insight and  judgement.   ____________________________________________   LABS (all labs ordered are listed, but only abnormal results are displayed)  Labs Reviewed - No data to display ____________________________________________  EKG   ____________________________________________  RADIOLOGY   No results found.  ____________________________________________    PROCEDURES  Procedure(s) performed:    Procedures    Medications  methylPREDNISolone sodium  succinate (SOLU-MEDROL) 125 mg/2 mL injection 125 mg (125 mg Intramuscular Given 09/05/18 1012)  diphenhydrAMINE (BENADRYL) capsule 50 mg (50 mg Oral Given 09/05/18 1011)     ____________________________________________   INITIAL IMPRESSION / ASSESSMENT AND PLAN / ED COURSE  Pertinent labs & imaging results that were available during my care of the patient were reviewed by me and considered in my medical decision making (see chart for details).  Review of the [place] CSRS was performed in accordance of the [facility] prior to dispensing any controlled drugs.     Patient presented to emergency department for evaluation of rash.  Vital signs and exam are reassuring.  Patient was given a referral to a new allergist.  Patient was encouraged to discuss with Dr. Hyacinth Meeker about medications that may be causing his rash.  He was also encouraged to call the medication management clinic to see if there is a pharmacist that can discuss his medications with him he there.  He was given a dose of IM Solu-Medrol.  Patient will be discharged home with prescriptions for prednisone. Patient is to follow up with primary care, allergy as directed. Patient is given ED precautions to return to the ED for any worsening or new symptoms.     ____________________________________________  FINAL CLINICAL IMPRESSION(S) / ED DIAGNOSES  Final diagnoses:  Rash      NEW MEDICATIONS STARTED DURING THIS VISIT:  ED Discharge Orders         Ordered    predniSONE  (DELTASONE) 10 MG tablet     09/05/18 1039              This chart was dictated using voice recognition software/Dragon. Despite best efforts to proofread, errors can occur which can change the meaning. Any change was purely unintentional.    Enid Derry, PA-C 09/05/18 1502    Sharyn Creamer, MD 09/05/18 [phone]

## 2018-09-08 ENCOUNTER — Ambulatory Visit (INDEPENDENT_AMBULATORY_CARE_PROVIDER_SITE_OTHER): Payer: Medicare Other | Admitting: Surgery

## 2018-09-08 ENCOUNTER — Encounter: Payer: Self-pay | Admitting: Surgery

## 2018-09-08 ENCOUNTER — Other Ambulatory Visit: Payer: Self-pay

## 2018-09-08 VITALS — BP 139/77 | HR 58 | Temp 97.2°F | Resp 18 | Ht 68.0 in | Wt 197.0 lb

## 2018-09-08 DIAGNOSIS — K3580 Unspecified acute appendicitis: Secondary | ICD-10-CM

## 2018-09-08 DIAGNOSIS — Z09 Encounter for follow-up examination after completed treatment for conditions other than malignant neoplasm: Secondary | ICD-10-CM

## 2018-09-08 NOTE — Patient Instructions (Signed)

## 2018-09-08 NOTE — Progress Notes (Signed)
09/08/2018  HPI: [name] is a 73 y.o. male s/p laparoscopic appendectomy on 08/12/2018.  Drain was left in place after surgery and was removed on 1/7 in the office.  He presents today for annual follow-up.  Reports that he is been doing very well from the surgery standpoint.  However he did go to the emergency room on 09/05/2018 due to a rash that he has been getting intermittently after an episode of Anaphylactic reaction last year.  Vital signs: BP 139/77   Pulse (!) 58   Temp (!) 97.2 F (36.2 C) (Temporal)   Resp 18   Ht 5\' 8"  (1.727 m)   Wt 197 lb (89.4 kg)   SpO2 95%   BMI 29.95 kg/m    Physical Exam: Constitutional: No acute distress Abdomen: Soft, nondistended, nontender to palpation.  All incisions are healing well with no evidence of infection or breakdown.  Assessment/Plan: This is a 73 y.o. male s/p laparoscopic appendectomy.  - Patient is doing very well he may resume all activities as usual. -Patient may follow-up with [place] on an as-needed basis.   Howie Ill, MD [facility]

## 2018-09-24 DIAGNOSIS — Z860100 Personal history of colon polyps, unspecified: Secondary | ICD-10-CM | POA: Insufficient documentation

## 2018-09-27 ENCOUNTER — Telehealth: Payer: Self-pay

## 2018-09-27 ENCOUNTER — Other Ambulatory Visit: Payer: Self-pay

## 2018-09-27 ENCOUNTER — Encounter: Payer: Self-pay | Admitting: Nurse Practitioner

## 2018-09-27 ENCOUNTER — Ambulatory Visit: Payer: Medicare Other | Attending: Nurse Practitioner | Admitting: Nurse Practitioner

## 2018-09-27 VITALS — BP 142/83 | HR 57 | Temp 98.5°F | Ht 69.0 in | Wt 192.0 lb

## 2018-09-27 DIAGNOSIS — M47817 Spondylosis without myelopathy or radiculopathy, lumbosacral region: Secondary | ICD-10-CM | POA: Diagnosis present

## 2018-09-27 DIAGNOSIS — M47814 Spondylosis without myelopathy or radiculopathy, thoracic region: Secondary | ICD-10-CM | POA: Insufficient documentation

## 2018-09-27 DIAGNOSIS — M792 Neuralgia and neuritis, unspecified: Secondary | ICD-10-CM | POA: Diagnosis present

## 2018-09-27 DIAGNOSIS — G894 Chronic pain syndrome: Secondary | ICD-10-CM

## 2018-09-27 MED ORDER — GABAPENTIN 100 MG PO CAPS: ORAL_CAPSULE | ORAL | 5 refills | Status: DC

## 2018-09-27 NOTE — Telephone Encounter (Signed)
   [facility] Pre-operative Risk Assessment    Request for surgical clearance:  1. What type of surgery is being performed? Colonscopy   2. When is this surgery scheduled? December 01, 2018   3. What type of clearance is required (medical clearance vs. Pharmacy clearance to hold med vs. Both)? Medical Clearance  4. Are there any medications that need to be held prior to surgery and how long?TBD   5. Practice name and name of physician performing surgery? [facility] Gastroenterology Dept Dr Denice Paradise, contact person Rogelio Seen, RN  6. What is your office phone number [phone]    7.   What is your office fax number [phone]  8.   Anesthesia type (None, local, MAC, general) ? Monitor Anesthesia    [name] T 09/27/2018, 1:19 PM  _________________________________________________________________   (provider comments below)  [name] clinic is requesting we send a recent visit note with signed clearance.

## 2018-09-27 NOTE — Progress Notes (Signed)
Patient's Name: [name]  MRN: [id number]  Referring Provider: [name], MD  DOB: 03-22-1946  PCP: [name], MD  DOS: 09/27/2018  Note by: Thad Ranger, NP  Service setting: Ambulatory outpatient  Specialty: Interventional Pain Management  Location: [facility] (AMB) Pain Management Facility    Patient type: Established   HPI  Reason for Visit: [name] is a 73 y.o. year old, male patient, who comes today with a chief complaint of Back Pain Last Appointment: He was last seen by me on Visit date not found. Pain Assessment: Today, [name] rates his pain  as a 0-No pain/10.  [name]'s  height is 5\' 9"  (1.753 m) and weight is 192 lb (87.1 kg). His temperature is 98.5 F (36.9 C). His blood pressure is 142/83 (abnormal) and his pulse is 57 (abnormal). His oxygen saturation is 99%.   He feels like his pain is well managed.  He denies any concerns today.  He did have to have emergency surgery in December for ruptured appendix and has been doing well from this.  He admits that he is having some changes in his medications due to reoccurring rashes.  ROS  Constitutional: Denies any fever or chills Gastrointestinal: No reported hemesis, hematochezia, vomiting, or acute GI distress Musculoskeletal: Denies any acute onset joint swelling, redness, loss of ROM, or weakness Neurological: No reported episodes of acute onset apraxia, aphasia, dysarthria, agnosia, amnesia, paralysis, loss of coordination, or loss of consciousness  Medication Review  Cyanocobalamin, EPINEPHrine, amiodarone, atorvastatin, diphenhydrAMINE, gabapentin, indapamide, isosorbide mononitrate, metoprolol tartrate, montelukast, pantoprazole, potassium chloride, and sodium fluoride  History Review  Allergy: [name] is allergic to ace inhibitors; aspirin; indocin [indomethacin]; mobic [meloxicam]; nsaids; and dairy aid [lactase]. Drug: [name]  reports no history of drug use. Alcohol:   reports current alcohol use. Tobacco:  reports that he quit smoking about 47 years ago. His smoking use included cigarettes. He has a 10.00 pack-year smoking history. He has never used smokeless tobacco. Social: [name]  reports that he quit smoking about 47 years ago. His smoking use included cigarettes. He has a 10.00 pack-year smoking history. He has never used smokeless tobacco. He reports current alcohol use. He reports that he does not use drugs. Medical:  has a past medical history of Abnormal chest CT, Anaphylactic reaction, Anxiety, Benign hypertension, Cardiomyopathy, CHF (congestive heart failure) (HCC), Chronic renal insufficiency, GERD (gastroesophageal reflux disease), Hives, Hyperlipidemia, IMPLANTATION OF DEFIBRILLATOR, HX OF (02/15/2009), NSVT (nonsustained ventricular tachycardia) (HCC), Osteoarthritis, Osteoarthrosis, unspecified whether generalized or localized, lower leg, Osteoporosis, Oxygen deficiency, Psoriasis, PVC (premature ventricular contraction), and Urticaria. Surgical: [name]  has a past surgical history that includes Knee Arthroplasty (Left, 2006); Knee Arthroplasty (Right, 12/10); Implantation of dual-chamber ICD.; Cardiac defibrillator removal (2014); Biv icd genertaor change out (N/A, 10/08/2012); Cardiac catheterization; Cataract extraction; Cataract extraction w/PHACO (Left, 03/12/2016); Tonsillectomy; and laparoscopic appendectomy (N/A, 08/12/2018). Family: family history includes Cancer in his mother; Heart attack in his father; Lung cancer in his mother. Problem List: [name] has Positive Murphy's Sign; Chronic pain syndrome; Chronic chest wall pain (Primary Area of Pain) (Left); and Chronic thoracic back pain (Secondary Area of Pain) (Left) on their pertinent problem list.  Lab Review  Kidney Function Lab Results  Component Value Date   BUN 15 08/13/2018   CREATININE 1.25 (H) 08/13/2018   BCR 9 (L) 01/05/2018   GFRAA >60 08/13/2018   GFRNONAA 58  (L) 08/13/2018  Liver Function Lab Results  Component Value Date   AST 20 08/12/2018   ALT 20 08/12/2018   ALBUMIN 3.4 (L) 08/12/2018  Note: Above Lab results reviewed.  Imaging Review  CT ABDOMEN PELVIS W CONTRAST CLINICAL DATA:  Abdominal pain.  Right flank pain.  EXAM: CT ABDOMEN AND PELVIS WITH CONTRAST  TECHNIQUE: Multidetector CT imaging of the abdomen and pelvis was performed using the standard protocol following bolus administration of intravenous contrast.  CONTRAST:  ISOVUE-300 IOPAMIDOL (ISOVUE-300) INJECTION 61%  COMPARISON:  10/23/2017  FINDINGS: Lower chest: No acute abnormality.  Hepatobiliary: No focal liver abnormality is seen. No gallstones, gallbladder wall thickening, or biliary dilatation.  Pancreas: Unremarkable. No pancreatic ductal dilatation or surrounding inflammatory changes.  Spleen: Normal in size without focal abnormality.  Adrenals/Urinary Tract: Adrenal glands are unremarkable. Kidneys are normal, without renal calculi, focal lesion, or hydronephrosis. Bladder is unremarkable.  Stomach/Bowel: Small hiatal hernia. No bowel dilatation to suggest bowel obstruction. Moderate amount of stool in the ascending colon. Dilated appendix measuring 12 mm with periappendiceal inflammatory changes. At the tip of the appendix there is a 2.9 x 2.7 cm complex fluid collection most consistent with an abscess. No pneumoperitoneum, pneumatosis or portal venous gas.  Vascular/Lymphatic: Aortic atherosclerosis. No enlarged abdominal or pelvic lymph nodes.  Reproductive: Prostate is unremarkable.  Other: No abdominal wall hernia or abnormality. No abdominopelvic ascites.  Musculoskeletal: No acute osseous abnormality. No aggressive osseous lesion.  IMPRESSION: 1. Findings consistent with acute appendicitis. At the tip of the appendix there is a 2.9 x 2.7 cm complex fluid collection most consistent with an abscess.  Electronically Signed    By: Elige Ko   On: 08/12/2018 10:31 Note: Above imaging results reviewed.        Physical Exam  General appearance: Well nourished, well developed, and well hydrated. In no apparent acute distress Mental status: Alert, oriented x 3 (person, place, & time)       Respiratory: No evidence of acute respiratory distress Eyes: PERLA Vitals: BP (!) 142/83   Pulse (!) 57   Temp 98.5 F (36.9 C)   Ht 5\' 9"  (1.753 m)   Wt 192 lb (87.1 kg)   SpO2 99%   BMI 28.35 kg/m  BMI: Estimated body mass index is 28.35 kg/m as calculated from the following:   Height as of this encounter: 5\' 9"  (1.753 m).   Weight as of this encounter: 192 lb (87.1 kg). Ideal: Ideal body weight: 70.7 kg (155 lb 13.8 oz) Adjusted ideal body weight: 77.3 kg (170 lb 5.1 oz)  Assessment   Status Diagnosis  Controlled Controlled Controlled 1. Spondylosis without myelopathy or radiculopathy, thoracic region   2. Neurogenic pain   3. Spondylosis without myelopathy or radiculopathy, lumbosacral region   4. Chronic pain syndrome      Updated Problems: Problem  History of Colonic Polyps  Thoracic ascending aortic aneurysm (HCC) (4.2 cm)    Plan of Care  Medications: I have discontinued Javien C. Llera's OXYGEN, furosemide, hydrALAZINE, carvedilol, acetaminophen, and predniSONE. I am also having him maintain his isosorbide mononitrate, pantoprazole, potassium chloride, Cyanocobalamin (VITAMIN B12 SL), atorvastatin, amiodarone, diphenhydrAMINE, DENTA 5000 PLUS, EPINEPHrine, indapamide, metoprolol tartrate, montelukast, and gabapentin.  Administered today: Jonluke Jinkins. Cornman had no medications administered during this visit.  Orders:  No orders of the defined types were placed in this encounter.  Interventional options: Planned follow-up:    Return in about 6 months (around 03/28/2019) for MedMgmt.   Considering:   Diagnostic Left thoracic ESI #2under fluoro,  no sedation  Diagnosticbilateral thoracic  facet nerve block Possiblebilateral thoracic facet RFA Diagnostic trigger point injection Diagnostic left sided TESI   Palliative PRN treatment(s):   Diagnostic Left T4-5 thoracic ESI #2under fluoro, no sedation      Note by: Thad Ranger, NP Date: 09/27/2018; Time: 2:42 PM

## 2018-09-27 NOTE — Patient Instructions (Signed)

## 2018-09-29 NOTE — Telephone Encounter (Signed)
   Primary Cardiologist: Olga Millers, MD  Chart reviewed as part of pre-operative protocol coverage. Patient was contacted 09/29/2018 in reference to pre-operative risk assessment for pending surgery as outlined below.  [name] was last seen on 05/27/2018 by [name].  Since that day, [name] has done well without chest pain or shortness of breath.  Therefore, based on ACC/AHA guidelines, the patient would be at acceptable risk for the planned procedure without further cardiovascular testing.   I will route this recommendation to the requesting party via Epic fax function and remove from pre-op pool.  Please call with questions.  [place], [place] 09/29/2018, 12:22 PM

## 2018-10-24 ENCOUNTER — Other Ambulatory Visit: Payer: Self-pay | Admitting: Cardiology

## 2018-11-29 ENCOUNTER — Other Ambulatory Visit: Payer: Self-pay | Admitting: *Deleted

## 2018-11-29 MED ORDER — POTASSIUM CHLORIDE ER 10 MEQ PO TBCR: 10.0000 meq | EXTENDED_RELEASE_TABLET | Freq: Every day | ORAL | 3 refills | Status: DC

## 2018-12-01 ENCOUNTER — Ambulatory Visit: Admit: 2018-12-01 | Payer: Medicare Other | Admitting: Unknown Physician Specialty

## 2018-12-01 SURGERY — COLONOSCOPY WITH PROPOFOL
Anesthesia: General

## 2018-12-13 NOTE — Progress Notes (Signed)
Virtual Visit via Video Note changed to telephone note due to technical difficulties with patient's smart phone.   This visit type was conducted due to national recommendations for restrictions regarding the COVID-19 Pandemic (e.g. social distancing) in an effort to limit this patient's exposure and mitigate transmission in our community.  Due to his co-morbid illnesses, this patient is at least at moderate risk for complications without adequate follow up.  This format is felt to be most appropriate for this patient at this time.  All issues noted in this document were discussed and addressed.  A limited physical exam was performed with this format.  Please refer to the patient's chart for his consent to telehealth for [facility].   Evaluation Performed:  Follow-up visit  Date:  12/20/2018   [name], DOB 1945-11-29, MRN [id number]  Patient Location: Home Provider Location: Office  PCP:  [name], MD  Cardiologist:  Olga Millers, MD    Chief Complaint:  CHF and cardiomyopathy  History of Present Illness:    FU nonischemic cardiomyopathy. Cardiac cath at [facility] in 2004 showed normal coronaries and EF 24; endocardial biopsy unrevealing. 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 Previous holter monitor in Dec 2012 showed frequent PVCs and nonsustained ventricular tachycardia. He also has had a previous ICD (generator removed in February of 2014 as his device had reached ERI and his LV function had improved). Abd ultrasound 1/15 showed no aneurysm.CTA May 2019 showed thoracic aortic aneurysm measuring 4.3 cm. Last echocardiogram October 2019 showed ejection fraction 35 to 40%, mild diastolic dysfunction, mild aortic insufficiency and mitral regurgitation, severe left atrial enlargement and mild tricuspid regurgitation.  Patient had laparoscopic appendectomy December 2019 for acute appendicitis.   Since last seen,the patient denies any dyspnea on exertion, orthopnea, PND, pedal edema, palpitations, syncope or chest pain.   The patient does not have symptoms concerning for COVID-19 infection (fever, chills, cough, or new shortness of breath).    Past Medical History:  Diagnosis Date  . Abnormal chest CT   . Anaphylactic reaction   . Anxiety   . Benign hypertension   . Cardiomyopathy   . CHF (congestive heart failure) (HCC)   . Chronic renal insufficiency    With creatinine 1.4  . GERD (gastroesophageal reflux disease)   . Hives    Long Hx of hives  . Hyperlipidemia   . IMPLANTATION OF DEFIBRILLATOR, HX OF 02/15/2009   Qualifier: Diagnosis of  By: Alan Som, MD, Lyn Hollingshead   . NSVT (nonsustained ventricular tachycardia) (HCC)   . Osteoarthritis   . Osteoarthrosis, unspecified whether generalized or localized, lower leg   . Osteoporosis   . Oxygen deficiency   . Psoriasis   . PVC (premature ventricular contraction)   . 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 BIV ICD GENERTAOR CHANGE OUT N/A 10/08/2012   Procedure: BIV ICD GENERTAOR CHANGE OUT;  Surgeon: Marinus Maw, MD;  Location: [facility] CATH LAB;  Service: Cardiovascular;  Laterality: N/A;  . CARDIAC CATHETERIZATION    . CARDIAC DEFIBRILLATOR REMOVAL  2014  . CATARACT EXTRACTION    . CATARACT EXTRACTION W/PHACO Left 03/12/2016   Procedure: CATARACT EXTRACTION PHACO AND INTRAOCULAR LENS PLACEMENT (IOC);  Surgeon: Sallee Lange, MD;  Location: [facility] ORS;  Service: Ophthalmology;  Laterality: Left;  [place] 01:32 AP% 25.7 CDE 41.33 fluid  pack lot # [id number]  . Implantation of dual-chamber ICD.     [employer] Jude DDD-ICD 1/07  . KNEE ARTHROPLASTY Left 2006  . KNEE ARTHROPLASTY Right 12/10   x2  . LAPAROSCOPIC APPENDECTOMY N/A 08/12/2018   Procedure: APPENDECTOMY LAPAROSCOPIC;  Surgeon: Henrene Dodge, MD;  Location: [facility] ORS;  Service: General;  Laterality: N/A;   . TONSILLECTOMY       Current Meds  Medication Sig  . amiodarone (PACERONE) 200 MG tablet TAKE 1 TABLET BY MOUTH EVERY DAY (Patient taking differently: Take 200 mg by mouth daily. )  . atorvastatin (LIPITOR) 40 MG tablet TAKE 1/2 TABLET (20 MG TOTAL) BY MOUTH DAILY. (Patient taking differently: Take 20 mg by mouth daily at 6 PM. )  . carvedilol (COREG) 25 MG tablet TAKE 2 TABLETS (50 MG TOTAL) BY MOUTH 2 (TWO) TIMES DAILY WITH A MEAL.  . diphenhydrAMINE (BENADRYL) 25 mg capsule Take 25 mg by mouth every 6 (six) hours as needed.  Marland Kitchen EPINEPHrine 0.3 mg/0.3 mL IJ SOAJ injection Inject 0.3 mg into the muscle as needed for anaphylaxis.  . famotidine (PEPCID) 40 MG tablet Take 40 mg by mouth 2 (two) times daily.  . furosemide (LASIX) 20 MG tablet Take by mouth.  . gabapentin (NEURONTIN) 100 MG capsule 1 cap TID and 3 cap(s) at HS. Max: 5/day  . hydrALAZINE (APRESOLINE) 50 MG tablet TAKE 1 & 1/2 TABLET BY MOUTH 3 TIMES DAILY (Patient taking differently: No sig reported)  . isosorbide mononitrate (IMDUR) 30 MG 24 hr tablet Take 1 tablet (30 mg total) by mouth daily.  Marland Kitchen levocetirizine (XYZAL) 5 MG tablet Take 5 mg by mouth every evening.  . montelukast (SINGULAIR) 10 MG tablet Take 10 mg by mouth at bedtime.  . pantoprazole (PROTONIX) 40 MG tablet Take 40 mg by mouth daily.   . potassium chloride (K-DUR) 10 MEQ tablet Take 1 tablet (10 mEq total) by mouth daily.     Allergies:   Ace inhibitors; Aspirin; Indocin [indomethacin]; Mobic [meloxicam]; Nsaids; and Dairy aid [lactase]   Social History   Tobacco Use  . Smoking status: Former Smoker    Packs/day: 1.00    Years: 10.00    Pack years: 10.00    Types: Cigarettes    Last attempt to quit: 12/31/1970    Years since quitting: 48.0  . Smokeless tobacco: Never Used  Substance Use Topics  . Alcohol use: Yes    Alcohol/week: 0.0 standard drinks    Comment: 2 DRINKS PER DAY, none this week (7/26)  . Drug use: No     Family Hx: The patient's  family history includes Cancer in his mother; Heart attack in his father; Lung cancer in his mother.  ROS:   Please see the history of present illness.    No fevers, chills or productive cough. All other systems reviewed and are negative.   Recent Labs: 05/09/2018: TSH 5.535 08/12/2018: ALT 20 08/13/2018: BUN 15; Creatinine, Ser 1.25; Hemoglobin 12.7; Magnesium 2.0; Platelets 197; Potassium 4.6; Sodium 138   Recent Lipid Panel Lab Results  Component Value Date/Time   CHOL 199 07/22/2007 01:17 PM   TRIG 145 07/22/2007 01:17 PM   HDL 53.6 07/22/2007 01:17 PM   CHOLHDL 3.7 CALC 07/22/2007 01:17 PM   LDLCALC 116 (H) 07/22/2007 01:17 PM   LDLDIRECT 128.1 02/17/2007 12:34 PM    Wt Readings from Last 3 Encounters:  12/20/18 190 lb (86.2 kg)  09/27/18 192 lb (87.1 kg)  09/08/18 197 lb (89.4 kg)  Objective:    Vital Signs:  BP 127/70   Pulse (!) 50   Ht 5\' 8"  (1.727 m)   Wt 190 lb (86.2 kg)   BMI 28.89 kg/m    VITAL SIGNS:  reviewed  Patient answers all questions appropriately. Normal affect. Remainder of physical exam not performed (telehealth visit; coronavirus pandemic)  ASSESSMENT & PLAN:    1. Nonischemic cardiomyopathy-patient does have a history of allergy to ACE inhibitors and ARB's.  Continue beta-blocker, hydralazine and nitrates. 2. Chronic systolic congestive heart failure-based on history he is euvolemic.  Plan to continue present dose of diuretic.  We discussed fluid restriction and low-sodium diet. 3. Hypertension-patient's blood pressure is controlled.  Continue present medications. 4. History of thoracic aortic aneurysm-we will arrange follow-up chest CT when coronavirus pandemic improves.  Will hold diuretics the day before and the day of his procedure.  Check potassium and renal function 3 days afterwards. 5. Frequent PVCs-this has felt to contribute to his cardiomyopathy in the past.  Continue amiodarone at present dose.  Last CT showed resolution of  previously seen groundglass opacities. 6. Hyperlipidemia-continue statin. 7. Chronic stage III kidney disease-followed by primary care.  COVID-19 Education: The importance of social distancing was discussed today.  Time:   Today, I have spent 15 minutes with the patient with telehealth technology discussing the above problems.     Medication Adjustments/Labs and Tests Ordered: Current medicines are reviewed at length with the patient today.  Concerns regarding medicines are outlined above.   Tests Ordered: No orders of the defined types were placed in this encounter.   Medication Changes: No orders of the defined types were placed in this encounter.   Disposition:  Follow up in 4 month(s)  Signed, Olga MillersBrian Terrel Nesheiwat, MD  12/20/2018 8:13 AM    [facility]

## 2018-12-17 ENCOUNTER — Telehealth: Payer: Self-pay | Admitting: Cardiology

## 2018-12-17 NOTE — Telephone Encounter (Signed)
Called x3 for pre reg/LVM °

## 2018-12-20 ENCOUNTER — Ambulatory Visit: Payer: Medicare Other | Admitting: Cardiology

## 2018-12-20 ENCOUNTER — Telehealth (INDEPENDENT_AMBULATORY_CARE_PROVIDER_SITE_OTHER): Payer: Medicare Other | Admitting: Cardiology

## 2018-12-20 ENCOUNTER — Encounter: Payer: Self-pay | Admitting: Cardiology

## 2018-12-20 VITALS — BP 127/70 | HR 50 | Ht 68.0 in | Wt 190.0 lb

## 2018-12-20 DIAGNOSIS — I5022 Chronic systolic (congestive) heart failure: Secondary | ICD-10-CM | POA: Diagnosis not present

## 2018-12-20 DIAGNOSIS — I712 Thoracic aortic aneurysm, without rupture, unspecified: Secondary | ICD-10-CM

## 2018-12-20 DIAGNOSIS — I10 Essential (primary) hypertension: Secondary | ICD-10-CM

## 2018-12-20 NOTE — Patient Instructions (Addendum)
Medication Instructions:  NO CHANGE If you need a refill on your cardiac medications before your next appointment, please call your pharmacy.   Lab work: Your physician recommends that you return for lab work 3 DAYS AFTER CT SCAN If you have labs (blood work) drawn today and your tests are completely normal, you will receive your results only by: Marland Kitchen MyChart Message (if you have MyChart) OR . A paper copy in the mail If you have any lab test that is abnormal or we need to change your treatment, we will call you to review the results.  Testing/Procedures: CTA OF THE CHEST TO FOLLOW UP THORACIC ANEURYSM AT [address] [facility]=DO NOT TAKE FUROSEMIDE THE DAY BEFORE OR THE DAY OF THE CT SCAN  Follow-Up: At [facility], you and your health needs are our priority.  As part of our continuing mission to provide you with exceptional heart care, we have created designated Provider Care Teams.  These Care Teams include your primary Cardiologist (physician) and Advanced Practice Providers (APPs -  Physician Assistants and Nurse Practitioners) who all work together to provide you with the care you need, when you need it. Your physician recommends that you schedule a follow-up appointment in: 4 MONTHS WITH DR Jens Som

## 2019-02-14 ENCOUNTER — Ambulatory Visit
Admission: RE | Admit: 2019-02-14 | Discharge: 2019-02-14 | Disposition: A | Discharge status: Home / Self Care | Payer: Medicare Other | Source: Ambulatory Visit | Attending: Cardiology | Admitting: Cardiology

## 2019-02-14 ENCOUNTER — Other Ambulatory Visit: Payer: Self-pay

## 2019-02-14 DIAGNOSIS — I712 Thoracic aortic aneurysm, without rupture, unspecified: Secondary | ICD-10-CM

## 2019-02-14 MED ORDER — IOPAMIDOL (ISOVUE-370) INJECTION 76%
60.0000 mL | Freq: Once | INTRAVENOUS | Status: AC | PRN
  Administered 2019-02-14: 60 mL via INTRAVENOUS

## 2019-02-17 LAB — BASIC METABOLIC PANEL
BUN/Creatinine Ratio: 8 — ABNORMAL LOW (ref 10–24)
BUN: 13 mg/dL (ref 8–27)
CO2: 24 mmol/L (ref 20–29)
Calcium: 9.1 mg/dL (ref 8.6–10.2)
Chloride: 104 mmol/L (ref 96–106)
Creatinine, Ser: 1.53 mg/dL — ABNORMAL HIGH (ref 0.76–1.27)
GFR calc Af Amer: 52 mL/min/{1.73_m2} — ABNORMAL LOW (ref >59)
GFR calc non Af Amer: 45 mL/min/{1.73_m2} — ABNORMAL LOW (ref >59)
Glucose: 103 mg/dL — ABNORMAL HIGH (ref 65–99)
Potassium: 4.8 mmol/L (ref 3.5–5.2)
Sodium: 142 mmol/L (ref 134–144)

## 2019-02-19 ENCOUNTER — Other Ambulatory Visit: Payer: Self-pay | Admitting: Cardiology

## 2019-03-03 ENCOUNTER — Other Ambulatory Visit: Payer: Self-pay | Admitting: Cardiology

## 2019-03-03 DIAGNOSIS — E785 Hyperlipidemia, unspecified: Secondary | ICD-10-CM

## 2019-03-17 ENCOUNTER — Encounter: Payer: Self-pay | Admitting: Pain Medicine

## 2019-03-17 NOTE — Progress Notes (Signed)
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  Had anaphylaxis and was taken to hospital and treated.    Please call patient's home phone for televisit.

## 2019-03-20 NOTE — Progress Notes (Signed)
Pain Management Virtual Encounter Note - Virtual Visit via Telephone Telehealth (real-time audio visits between healthcare provider and patient).   Patient's Phone No. & Preferred Pharmacy:  [phone] (home); [phone] (mobile); (Preferred) [phone] [email or web address]  [facility] [phone] [place] [facility]- [place], [facility] - [address] [place] [id number] Phone: [phone] Fax: [phone]    Pre-screening note:  Our staff contacted [name] and offered him an "in person", "face-to-face" appointment versus a telephone encounter. He indicated preferring the telephone encounter, at this time.   Reason for Virtual Visit: COVID-19*  Social distancing based on CDC and AMA recommendations.   I contacted [name] on 03/21/2019 via telephone.      I clearly identified myself as Oswaldo DoneFrancisco A Virginia Curl, MD. I verified that I was speaking with the correct person using two identifiers (Name: [name], and date of birth: 1946/08/12).  Advanced Informed Consent I sought verbal advanced consent from [name] for virtual visit interactions. I informed [name] of possible security and privacy concerns, risks, and limitations associated with providing "not-in-person" medical evaluation and management services. I also informed [name] of the availability of "in-person" appointments. Finally, I informed him that there would be a charge for the virtual visit and that he could be  personally, fully or partially, financially responsible for it. [name] expressed understanding and agreed to proceed.   Historic Elements   [name] is a 73 y.o. year old, male patient evaluated today after his last encounter by our practice on 09/27/2018. [name]  has a past medical history of Abnormal chest CT, Anaphylactic reaction, Anxiety, Benign hypertension, Cardiomyopathy, CHF (congestive heart failure) (HCC), Chronic renal insufficiency, GERD  (gastroesophageal reflux disease), Hives, Hyperlipidemia, IMPLANTATION OF DEFIBRILLATOR, HX OF (02/15/2009), NSVT (nonsustained ventricular tachycardia) (HCC), Osteoarthritis, Osteoarthrosis, unspecified whether generalized or localized, lower leg, Osteoporosis, Oxygen deficiency, Psoriasis, PVC (premature ventricular contraction), and Urticaria. He also  has a past surgical history that includes Knee Arthroplasty (Left, 2006); Knee Arthroplasty (Right, 12/10); Implantation of dual-chamber ICD.; Cardiac defibrillator removal (2014); Biv icd genertaor change out (N/A, 10/08/2012); Cardiac catheterization; Cataract extraction; Cataract extraction w/PHACO (Left, 03/12/2016); Tonsillectomy; and laparoscopic appendectomy (N/A, 08/12/2018). [name] has a current medication list which includes the following prescription(s): amiodarone, atorvastatin, carvedilol, cyanocobalamin, denta 5000 plus, diphenhydramine, epinephrine, famotidine, furosemide, gabapentin, hydralazine, levocetirizine, montelukast, pantoprazole, potassium chloride, indapamide, isosorbide mononitrate, and metoprolol tartrate. He  reports that he quit smoking about 48 years ago. His smoking use included cigarettes. He has a 10.00 pack-year smoking history. He has never used smokeless tobacco. He reports current alcohol use. He reports that he does not use drugs. [name] is allergic to meat [alpha-gal]; ace inhibitors; aspirin; indocin [indomethacin]; mobic [meloxicam]; nsaids; and dairy aid [lactase].   HPI  Today, he is being contacted for medication management.  The patient indicates doing extremely well with his gabapentin.  He describes no side effects or any type of complications.  He still complains of intermittent occasional right lower extremity pain, which right now he is not experiencing, but he is a little concerned about it.  I tried to gather some information from him about the pain pattern, but he apparently had not paid much  attention to it.  I told him to pay attention to whether or not he gets only pain, pain and numbness, or pain numbness and weakness, or any combination of those.  I also told him to pay attention to the pattern and the area of the  leg where he gets the pain.  The patient was told to keep track of how far down the leg it goes.  He indicates that he is pretty sure that it has never reached his foot.  As a curiosity, the patient indicated recently having had an episode of delayed anaphylactic shock, which apparently proved to be an allergy to meat, assumed to be secondary to the bite of a [place] tick.  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  This is really not along the lines of my specialty, but he felt that he needed to share that with [place].  Pharmacotherapy Assessment  Analgesic: No opioid analgesics from our practice.   Monitoring: Pharmacotherapy: No side-effects or adverse reactions reported. [place] PMP: PDMP reviewed during this encounter.       Compliance: No problems identified. Effectiveness: Clinically acceptable. Plan: Refer to "POC".  UDS:  Summary  Date Value Ref Range Status  01/05/2018 FINAL  Final    Comment:    ==================================================================== TOXASSURE COMP DRUG ANALYSIS,UR ==================================================================== Test                             Result       Flag       Units Drug Present not Declared for Prescription Verification   Acetaminophen                  PRESENT      UNEXPECTED ==================================================================== Test                      Result    Flag   Units      Ref Range   Creatinine              273              mg/dL      >=20 ==================================================================== Declared Medications:  The flagging and interpretation on this report are based on the  following declared medications.   Unexpected results may arise from  inaccuracies in the declared medications.  **Note: The testing scope of this panel does not include following  reported medications:  Amiodarone  Atorvastatin  Carvedilol  Cyanocobalamin  Furosemide  Hydralazine  Isosorbide Mononitrate  Oxygen  Pantoprazole  Potassium  Topical ==================================================================== For clinical consultation, please call [phone]. ====================================================================    Laboratory Chemistry Profile (12 mo)  Renal: 02/17/2019: BUN 13; BUN/Creatinine Ratio 8; Creatinine, Ser 1.53; GFR calc Af Amer 52; GFR calc non Af Amer 45  Hepatic: 08/12/2018: Albumin 3.4; ALT 20; AST 20 Other: No results found for requested labs within last 8760 hours. Note: Above Lab results reviewed.  Imaging  Last 90 days:  Ct Angio Chest Aorta W &/or Wo Contrast  Result Date: 02/14/2019 CLINICAL DATA:  Follow-up thoracic aortic aneurysm. EXAM: CT ANGIOGRAPHY CHEST WITH CONTRAST TECHNIQUE: Multidetector CT imaging of the chest was performed using the standard protocol during bolus administration of intravenous contrast. Multiplanar CT image reconstructions and MIPs were obtained to evaluate the vascular anatomy. CONTRAST:  69mL ISOVUE-370 IOPAMIDOL (ISOVUE-370) INJECTION 76% COMPARISON:  12/23/2017 chest CT angiogram. FINDINGS: Cardiovascular: Stable mild cardiomegaly. No significant pericardial effusion/thickening. Left anterior descending coronary atherosclerosis. 2 lead left subclavian ICD with lead tips in the right atrium and right ventricular apex. Atherosclerotic thoracic aorta with stable ectatic 4.2 cm ascending thoracic aorta. Aortic isthmus diameter 3.0 cm. Maximum descending thoracic aortic diameter 3.0  cm. Aortic diameter 3.0 cm at the diaphragmatic hiatus. Stable dilated main pulmonary artery (3.7 cm diameter). No central pulmonary emboli. Mediastinum/Nodes: No discrete  thyroid nodules. Unremarkable esophagus. No pathologically enlarged axillary, mediastinal or hilar lymph nodes. Lungs/Pleura: No pneumothorax. No pleural effusion. No acute consolidative airspace disease, lung masses or significant pulmonary nodules. Upper abdomen: Small hiatal hernia, unchanged. Cholelithiasis. Colonic diverticulosis. Musculoskeletal: No aggressive appearing focal osseous lesions. Moderate thoracic spondylosis. Review of the MIP images confirms the above findings. IMPRESSION: 1. Stable ectatic 4.2 cm ascending thoracic aorta. Recommend annual imaging followup by CTA or MRA. This recommendation follows 2010 ACCF/AHA/AATS/ACR/ASA/SCA/SCAI/SIR/STS/SVM Guidelines for the Diagnosis and Management of Patients with Thoracic Aortic Disease. Circulation. 2010; 121: Y694-W546. Aortic aneurysm NOS (ICD10-I71.9). 2. One vessel coronary atherosclerosis. 3. Cholelithiasis. 4. Small hiatal hernia. 5. Colonic diverticulosis. Aortic Atherosclerosis (ICD10-I70.0). Electronically Signed   By: Delbert Phenix M.D.   On: 02/14/2019 09:27   Last Hospital Admission:  Ct Angio Chest Aorta W &/or Wo Contrast  Result Date: 02/14/2019 CLINICAL DATA:  Follow-up thoracic aortic aneurysm. EXAM: CT ANGIOGRAPHY CHEST WITH CONTRAST TECHNIQUE: Multidetector CT imaging of the chest was performed using the standard protocol during bolus administration of intravenous contrast. Multiplanar CT image reconstructions and MIPs were obtained to evaluate the vascular anatomy. CONTRAST:  66mL ISOVUE-370 IOPAMIDOL (ISOVUE-370) INJECTION 76% COMPARISON:  12/23/2017 chest CT angiogram. FINDINGS: Cardiovascular: Stable mild cardiomegaly. No significant pericardial effusion/thickening. Left anterior descending coronary atherosclerosis. 2 lead left subclavian ICD with lead tips in the right atrium and right ventricular apex. Atherosclerotic thoracic aorta with stable ectatic 4.2 cm ascending thoracic aorta. Aortic isthmus diameter 3.0 cm. Maximum  descending thoracic aortic diameter 3.0 cm. Aortic diameter 3.0 cm at the diaphragmatic hiatus. Stable dilated main pulmonary artery (3.7 cm diameter). No central pulmonary emboli. Mediastinum/Nodes: No discrete thyroid nodules. Unremarkable esophagus. No pathologically enlarged axillary, mediastinal or hilar lymph nodes. Lungs/Pleura: No pneumothorax. No pleural effusion. No acute consolidative airspace disease, lung masses or significant pulmonary nodules. Upper abdomen: Small hiatal hernia, unchanged. Cholelithiasis. Colonic diverticulosis. Musculoskeletal: No aggressive appearing focal osseous lesions. Moderate thoracic spondylosis. Review of the MIP images confirms the above findings. IMPRESSION: 1. Stable ectatic 4.2 cm ascending thoracic aorta. Recommend annual imaging followup by CTA or MRA. This recommendation follows 2010 ACCF/AHA/AATS/ACR/ASA/SCA/SCAI/SIR/STS/SVM Guidelines for the Diagnosis and Management of Patients with Thoracic Aortic Disease. Circulation. 2010; 121: E703-J009. Aortic aneurysm NOS (ICD10-I71.9). 2. One vessel coronary atherosclerosis. 3. Cholelithiasis. 4. Small hiatal hernia. 5. Colonic diverticulosis. Aortic Atherosclerosis (ICD10-I70.0). Electronically Signed   By: Delbert Phenix M.D.   On: 02/14/2019 09:27   Assessment  The primary encounter diagnosis was Chronic pain syndrome. Diagnoses of Chronic chest wall pain (Primary Area of Pain) (Left), Chronic thoracic back pain (Secondary Area of Pain) (Left), and Neurogenic pain were also pertinent to this visit.  Plan of Care  I have changed [name]'s gabapentin. I am also having him maintain his isosorbide mononitrate, pantoprazole, Cyanocobalamin (VITAMIN B12 SL), amiodarone, diphenhydrAMINE, Denta 5000 Plus, EPINEPHrine, indapamide, metoprolol tartrate, montelukast, hydrALAZINE, potassium chloride, furosemide, levocetirizine, famotidine, carvedilol, and atorvastatin.  Pharmacotherapy (Medications Ordered): Meds  ordered this encounter  Medications  . gabapentin (NEURONTIN) 100 MG capsule    Sig: Take 1 capsule (100 mg total) by mouth 2 (two) times daily AND 3 capsules (300 mg total) at bedtime. Max: 5/day.    Dispense:  150 capsule    Refill:  11    Fill one day early if pharmacy is closed on scheduled refill date. May  substitute for generic if available.   Orders:  No orders of the defined types were placed in this encounter.  Follow-up plan:   Return in about 1 year (around 03/20/2020) for (VV), E/M (MM).      Interventional options:  Considering: Diagnostic Left thoracic ESI #2under fluoro,no sedation Diagnosticbilateral thoracic facet nerve block Possiblebilateral thoracic facet RFA Diagnostic trigger point injection Diagnostic left sided TESI   Palliative PRN treatment(s): Diagnostic LeftT4-5thoracic ESI #2under fluoro,no sedation    Recent Visits No visits were found meeting these conditions.  Showing recent visits within past 90 days and meeting all other requirements   Today's Visits Date Type Provider Dept  03/21/19 Office Visit Delano MetzNaveira, Bricia Taher, MD [facility]  Showing today's visits and meeting all other requirements   Future Appointments No visits were found meeting these conditions.  Showing future appointments within next 90 days and meeting all other requirements   I discussed the assessment and treatment plan with the patient. The patient was provided an opportunity to ask questions and all were answered. The patient agreed with the plan and demonstrated an understanding of the instructions.  Patient advised to call back or seek an in-person evaluation if the symptoms or condition worsens.  Total duration of non-face-to-face encounter: 15 minutes.  Note by: Oswaldo DoneFrancisco A Danessa Mensch, MD Date: 03/21/2019; Time: 3:05 PM  Note: This dictation was prepared with Dragon dictation. Any transcriptional errors that may result from this process are  unintentional.  Disclaimer:  * Given the special circumstances of the COVID-19 pandemic, the federal government has announced that the Office for Civil Rights (OCR) will exercise its enforcement discretion and will not impose penalties on physicians using telehealth in the event of noncompliance with regulatory requirements under the [employer] Insurance Portability and Accountability Act (HIPAA) in connection with the good faith provision of telehealth during the COVID-19 national public health emergency. (AMA)

## 2019-03-21 ENCOUNTER — Ambulatory Visit: Payer: Medicare Other | Attending: Nurse Practitioner | Admitting: Pain Medicine

## 2019-03-21 ENCOUNTER — Other Ambulatory Visit: Payer: Self-pay

## 2019-03-21 ENCOUNTER — Encounter: Payer: Self-pay | Admitting: Pain Medicine

## 2019-03-21 ENCOUNTER — Encounter: Payer: Medicare Other | Admitting: Nurse Practitioner

## 2019-03-21 DIAGNOSIS — M792 Neuralgia and neuritis, unspecified: Secondary | ICD-10-CM | POA: Diagnosis not present

## 2019-03-21 DIAGNOSIS — G8929 Other chronic pain: Secondary | ICD-10-CM

## 2019-03-21 DIAGNOSIS — R0789 Other chest pain: Secondary | ICD-10-CM | POA: Diagnosis not present

## 2019-03-21 DIAGNOSIS — M546 Pain in thoracic spine: Secondary | ICD-10-CM | POA: Diagnosis not present

## 2019-03-21 DIAGNOSIS — G894 Chronic pain syndrome: Secondary | ICD-10-CM | POA: Diagnosis not present

## 2019-03-21 MED ORDER — GABAPENTIN 100 MG PO CAPS: ORAL_CAPSULE | ORAL | 11 refills | Status: DC

## 2019-04-22 ENCOUNTER — Other Ambulatory Visit: Payer: Self-pay

## 2019-04-22 ENCOUNTER — Other Ambulatory Visit
Admission: RE | Admit: 2019-04-22 | Discharge: 2019-04-22 | Disposition: A | Discharge status: Home / Self Care | Payer: Medicare Other | Source: Ambulatory Visit | Attending: Gastroenterology | Admitting: Gastroenterology

## 2019-04-22 ENCOUNTER — Encounter: Payer: Self-pay | Admitting: *Deleted

## 2019-04-22 DIAGNOSIS — Z20828 Contact with and (suspected) exposure to other viral communicable diseases: Secondary | ICD-10-CM | POA: Insufficient documentation

## 2019-04-22 DIAGNOSIS — Z01812 Encounter for preprocedural laboratory examination: Secondary | ICD-10-CM | POA: Insufficient documentation

## 2019-04-22 LAB — SARS CORONAVIRUS 2 (TAT 6-24 HRS): SARS Coronavirus 2: NEGATIVE

## 2019-04-26 ENCOUNTER — Encounter: Payer: Self-pay | Admitting: Anesthesiology

## 2019-04-26 ENCOUNTER — Ambulatory Visit: Payer: Medicare Other | Admitting: Anesthesiology

## 2019-04-26 ENCOUNTER — Encounter
Admission: RE | Disposition: A | Discharge status: Home / Self Care | Payer: Self-pay | Source: Home / Self Care | Attending: Gastroenterology

## 2019-04-26 ENCOUNTER — Other Ambulatory Visit: Payer: Self-pay

## 2019-04-26 ENCOUNTER — Ambulatory Visit: Payer: Medicare Other | Admitting: Cardiology

## 2019-04-26 ENCOUNTER — Ambulatory Visit
Admission: RE | Admit: 2019-04-26 | Discharge: 2019-04-26 | Disposition: A | Discharge status: Home / Self Care | Payer: Medicare Other | Attending: Gastroenterology | Admitting: Gastroenterology

## 2019-04-26 DIAGNOSIS — Z79899 Other long term (current) drug therapy: Secondary | ICD-10-CM | POA: Diagnosis not present

## 2019-04-26 DIAGNOSIS — I2510 Atherosclerotic heart disease of native coronary artery without angina pectoris: Secondary | ICD-10-CM | POA: Diagnosis not present

## 2019-04-26 DIAGNOSIS — M810 Age-related osteoporosis without current pathological fracture: Secondary | ICD-10-CM | POA: Diagnosis not present

## 2019-04-26 DIAGNOSIS — K635 Polyp of colon: Secondary | ICD-10-CM | POA: Diagnosis not present

## 2019-04-26 DIAGNOSIS — J45909 Unspecified asthma, uncomplicated: Secondary | ICD-10-CM | POA: Insufficient documentation

## 2019-04-26 DIAGNOSIS — Z87891 Personal history of nicotine dependence: Secondary | ICD-10-CM | POA: Diagnosis not present

## 2019-04-26 DIAGNOSIS — N189 Chronic kidney disease, unspecified: Secondary | ICD-10-CM | POA: Insufficient documentation

## 2019-04-26 DIAGNOSIS — I130 Hypertensive heart and chronic kidney disease with heart failure and stage 1 through stage 4 chronic kidney disease, or unspecified chronic kidney disease: Secondary | ICD-10-CM | POA: Diagnosis not present

## 2019-04-26 DIAGNOSIS — F419 Anxiety disorder, unspecified: Secondary | ICD-10-CM | POA: Diagnosis not present

## 2019-04-26 DIAGNOSIS — K5730 Diverticulosis of large intestine without perforation or abscess without bleeding: Secondary | ICD-10-CM | POA: Insufficient documentation

## 2019-04-26 DIAGNOSIS — Z1211 Encounter for screening for malignant neoplasm of colon: Secondary | ICD-10-CM | POA: Insufficient documentation

## 2019-04-26 DIAGNOSIS — J449 Chronic obstructive pulmonary disease, unspecified: Secondary | ICD-10-CM | POA: Insufficient documentation

## 2019-04-26 DIAGNOSIS — I429 Cardiomyopathy, unspecified: Secondary | ICD-10-CM | POA: Insufficient documentation

## 2019-04-26 DIAGNOSIS — K219 Gastro-esophageal reflux disease without esophagitis: Secondary | ICD-10-CM | POA: Diagnosis not present

## 2019-04-26 DIAGNOSIS — I509 Heart failure, unspecified: Secondary | ICD-10-CM | POA: Insufficient documentation

## 2019-04-26 HISTORY — DX: Adverse effect of unspecified drugs, medicaments and biological substances, initial encounter: T50.905A

## 2019-04-26 HISTORY — PX: COLONOSCOPY WITH PROPOFOL: SHX5780

## 2019-04-26 HISTORY — DX: Other cardiomyopathies: I42.8

## 2019-04-26 HISTORY — DX: Cardiomyopathy, unspecified: I42.9

## 2019-04-26 HISTORY — DX: Personal history of colon polyps: Z86.010

## 2019-04-26 HISTORY — DX: Allergic urticaria: L50.0

## 2019-04-26 HISTORY — DX: Family history of ischemic heart disease and other diseases of the circulatory system: Z82.49

## 2019-04-26 HISTORY — DX: Personal history of colon polyps, unspecified: Z86.0100

## 2019-04-26 SURGERY — COLONOSCOPY WITH PROPOFOL
Anesthesia: General

## 2019-04-26 MED ORDER — EPHEDRINE SULFATE 50 MG/ML IJ SOLN
INTRAMUSCULAR | Status: DC | PRN
  Administered 2019-04-26: 10 mg via INTRAVENOUS
  Administered 2019-04-26: 5 mg via INTRAVENOUS

## 2019-04-26 MED ORDER — SODIUM CHLORIDE 0.9 % IV SOLN
INTRAVENOUS | Status: DC
  Administered 2019-04-26: 13:00:00 via INTRAVENOUS

## 2019-04-26 MED ORDER — ONDANSETRON HCL 4 MG/2ML IJ SOLN: 4.0000 mg | Freq: Once | INTRAMUSCULAR | Status: DC | PRN

## 2019-04-26 MED ORDER — EPHEDRINE SULFATE 50 MG/ML IJ SOLN
INTRAMUSCULAR | Status: AC
  Filled 2019-04-26: qty 1

## 2019-04-26 MED ORDER — GLYCOPYRROLATE 0.2 MG/ML IJ SOLN
INTRAMUSCULAR | Status: DC | PRN
  Administered 2019-04-26: 0.1 mg via INTRAVENOUS

## 2019-04-26 MED ORDER — FENTANYL CITRATE (PF) 100 MCG/2ML IJ SOLN: 25.0000 ug | INTRAMUSCULAR | Status: DC | PRN

## 2019-04-26 MED ORDER — PROPOFOL 500 MG/50ML IV EMUL
INTRAVENOUS | Status: DC | PRN
  Administered 2019-04-26: 100 ug/kg/min via INTRAVENOUS

## 2019-04-26 MED ORDER — PROPOFOL 10 MG/ML IV BOLUS
INTRAVENOUS | Status: AC
  Filled 2019-04-26: qty 20

## 2019-04-26 MED ORDER — GLYCOPYRROLATE 0.2 MG/ML IJ SOLN
INTRAMUSCULAR | Status: AC
  Filled 2019-04-26: qty 1

## 2019-04-26 MED ORDER — LIDOCAINE HCL (CARDIAC) PF 100 MG/5ML IV SOSY
PREFILLED_SYRINGE | INTRAVENOUS | Status: DC | PRN
  Administered 2019-04-26: 60 mg via INTRATRACHEAL

## 2019-04-26 MED ORDER — PROPOFOL 10 MG/ML IV BOLUS
INTRAVENOUS | Status: DC | PRN
  Administered 2019-04-26: 40 mg via INTRAVENOUS
  Administered 2019-04-26: 30 mg via INTRAVENOUS
  Administered 2019-04-26: 50 mg via INTRAVENOUS

## 2019-04-26 MED ORDER — LIDOCAINE HCL (PF) 2 % IJ SOLN
INTRAMUSCULAR | Status: AC
  Filled 2019-04-26: qty 10

## 2019-04-26 MED ORDER — PROPOFOL 10 MG/ML IV BOLUS
INTRAVENOUS | Status: AC
  Filled 2019-04-26: qty 40

## 2019-04-26 NOTE — Anesthesia Postprocedure Evaluation (Signed)
Anesthesia Post Note  Patient: [name]  Procedure(s) Performed: COLONOSCOPY WITH PROPOFOL (N/A )  Patient location during evaluation: Endoscopy Anesthesia Type: General Level of consciousness: awake and alert and oriented Pain management: pain level controlled Vital Signs Assessment: post-procedure vital signs reviewed and stable Respiratory status: spontaneous breathing Cardiovascular status: blood pressure returned to baseline Anesthetic complications: no     Last Vitals:  Vitals:   04/26/19 1353 04/26/19 1354  BP: 131/68 131/68  Pulse:  (!) 49  Resp:  16  Temp: 36.7 C   SpO2:  100%    Last Pain:  Vitals:   04/26/19 1403  TempSrc:   PainSc: 0-No pain                 [name]

## 2019-04-26 NOTE — Anesthesia Post-op Follow-up Note (Signed)
Anesthesia QCDR form completed.        

## 2019-04-26 NOTE — Transfer of Care (Signed)
Immediate Anesthesia Transfer of Care Note  Patient: [name]  Procedure(s) Performed: COLONOSCOPY WITH PROPOFOL (N/A )  Patient Location: PACU and Endoscopy Unit  Anesthesia Type:General  Level of Consciousness: awake and patient cooperative  Airway & Oxygen Therapy: Patient Spontanous Breathing and Patient connected to nasal cannula oxygen  Post-op Assessment: Report given to RN and Post -op Vital signs reviewed and stable  Post vital signs: stable  Last Vitals:  Vitals Value Taken Time  BP 131/68 04/26/19 1353  Temp    Pulse 49 04/26/19 1354  Resp 16 04/26/19 1354  SpO2 100 % 04/26/19 1354  Vitals shown include unvalidated device data.  Last Pain:  Vitals:   04/26/19 1224  TempSrc: Tympanic  PainSc: 0-No pain         Complications: No apparent anesthesia complications

## 2019-04-26 NOTE — H&P (Signed)
Outpatient short stay form Pre-procedure 04/26/2019 1:05 PM Christena Deem MD  Primary Physician: Dr. Bethann Punches  Reason for visit: Colonoscopy  History of present illness: Patient is a 73 year old male presenting today for colonoscopy in regards to his personal history of adenomatous colon polyps.  He had an appendectomy on 12 26,019.  He did require drain for 2 weeks afterwards.  Is done well since that time.  His last colonoscopy was 09/19/2013.  Tolerated his prep well.  He takes no aspirin or blood thinning agent.    Current Facility-Administered Medications:  .  0.9 %  sodium chloride infusion, , Intravenous, Continuous, Christena Deem, MD, Last Rate: 20 mL/hr at 04/26/19 1248 .  fentaNYL (SUBLIMAZE) injection 25 mcg, 25 mcg, Intravenous, Q5 min PRN, Yves Dill, MD .  ondansetron [facility]) injection 4 mg, 4 mg, Intravenous, Once PRN, Yves Dill, MD  Medications Prior to Admission  Medication Sig Dispense Refill Last Dose  . amiodarone (PACERONE) 200 MG tablet TAKE 1 TABLET BY MOUTH EVERY DAY (Patient taking differently: Take 200 mg by mouth daily. ) 90 tablet 3 04/26/2019 at 0500  . atorvastatin (LIPITOR) 40 MG tablet TAKE 1/2 TABLET (20 MG TOTAL) BY MOUTH DAILY. 45 tablet 3 04/25/2019 at Unknown time  . carvedilol (COREG) 25 MG tablet TAKE 2 TABLETS (50 MG TOTAL) BY MOUTH 2 (TWO) TIMES DAILY WITH A MEAL. 360 tablet 3 04/26/2019 at 0500  . cetirizine (ZYRTEC) 10 MG tablet Take 10 mg by mouth daily.     . DENTA 5000 PLUS 1.1 % CREA dental cream See admin instructions.   04/26/2019 at Unknown time  . diphenhydrAMINE (BENADRYL) 25 mg capsule Take 25 mg by mouth every 6 (six) hours as needed.   04/26/2019 at 0500  . famotidine (PEPCID) 40 MG tablet Take 40 mg by mouth 2 (two) times daily.   04/26/2019 at 0500  . furosemide (LASIX) 20 MG tablet Take by mouth.   04/25/2019 at Unknown time  . gabapentin (NEURONTIN) 100 MG capsule Take 1 capsule (100 mg total) by mouth 2 (two) times daily AND 3  capsules (300 mg total) at bedtime. Max: 5/day. 150 capsule 11 04/26/2019 at 0500  . hydrALAZINE (APRESOLINE) 50 MG tablet TAKE 1 & 1/2 TABLET BY MOUTH 3 TIMES DAILY (Patient taking differently: No sig reported) 405 tablet 1 04/26/2019 at 0500  . isosorbide mononitrate (IMDUR) 30 MG 24 hr tablet Take 1 tablet (30 mg total) by mouth daily.   04/26/2019 at 0500  . levocetirizine (XYZAL) 5 MG tablet Take 5 mg by mouth every evening.   04/26/2019 at 0500  . montelukast (SINGULAIR) 10 MG tablet Take 10 mg by mouth at bedtime.   04/26/2019 at 0500  . potassium chloride (K-DUR) 10 MEQ tablet Take 1 tablet (10 mEq total) by mouth daily. 90 tablet 3 04/25/2019 at Unknown time  . predniSONE (DELTASONE) 20 MG tablet Take 20 mg by mouth daily with breakfast.   Past Month at Unknown time  . Cyanocobalamin (VITAMIN B12 SL) Place 1 tablet under the tongue daily.     Marland Kitchen EPINEPHrine 0.3 mg/0.3 mL IJ SOAJ injection Inject 0.3 mg into the muscle as needed for anaphylaxis.     . indapamide (LOZOL) 2.5 MG tablet Take 2.5 mg by mouth daily.     . metoprolol tartrate (LOPRESSOR) 50 MG tablet Take 50 mg by mouth 2 (two) times daily.     . pantoprazole (PROTONIX) 40 MG tablet Take 40 mg by mouth daily.  Allergies  Allergen Reactions  . 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 Aspirin Other (See Comments)    Other Reaction: Other reaction  . Indocin [Indomethacin] Other (See Comments)  . Mobic [Meloxicam] Other (See Comments)  . Nsaids Other (See Comments)    Other Reaction: Other reaction Other Reaction: Other reaction     Past Medical History:  Diagnosis Date  . Abnormal chest CT   . Anaphylactic reaction   . Anxiety   . Asthma   . Benign hypertension   . Cardiomyopathy   . Cardiomyopathy, idiopathic ([place])   . CHF (congestive heart failure) ([place])   . Chronic renal insufficiency    With  creatinine 1.4  . Drug-induced urticaria   . FH: thoracic aortic aneurysm   . GERD (gastroesophageal reflux disease)   . History of colon polyps   . Hives    Long Hx of hives  . Hyperlipidemia   . Hyperlipidemia   . IMPLANTATION OF DEFIBRILLATOR, HX OF 02/15/2009   Qualifier: Diagnosis of  By: Stanford Breed, MD, Kandyce Rud   . NSVT (nonsustained ventricular tachycardia) ([place])   . Osteoarthritis   . Osteoarthrosis, unspecified whether generalized or localized, lower leg   . Osteoporosis   . Oxygen deficiency   . Psoriasis   . PVC (premature ventricular contraction)   . 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    Planned proceedures: Colonoscopy and indicated procedures. I have discussed the risks benefits and complications of procedures to include not limited to bleeding, infection, perforation and the risk of sedation and the patient wishes to proceed.    Lollie Sails, MD Gastroenterology 04/26/2019  1:05 PM

## 2019-04-26 NOTE — Op Note (Signed)
[facility] Gastroenterology Patient Name: [name] Procedure Date: 04/26/2019 1:06 PM MRN: [id number] Account #: [id number] Date of Birth: [birth year] Admit Type: Outpatient Age: 73 Room: [facility] ENDO ROOM 2 Gender: Male Note Status: Finalized Procedure:            Colonoscopy Indications:          Personal history of colonic polyps Providers:            Lollie Sails, MD Referring MD:         Rusty Aus, MD (Referring MD) Medicines:            Monitored Anesthesia Care Complications:        No immediate complications. Procedure:            Pre-Anesthesia Assessment:                       - ASA Grade Assessment: III - A patient with severe                        systemic disease.                       After obtaining informed consent, the colonoscope was                        passed under direct vision. Throughout the procedure,                        the patient's blood pressure, pulse, and oxygen                        saturations were monitored continuously. The                        Colonoscope was introduced through the anus and                        advanced to the the cecum, identified by appendiceal                        orifice and ileocecal valve. The colonoscopy was                        performed with moderate difficulty due to poor bowel                        prep with stool present. Successful completion of the                        procedure was aided by lavage. The patient tolerated                        the procedure well. The quality of the bowel                        preparation was fair. Findings:      Multiple small-mouthed diverticula were found in the sigmoid colon,       descending colon, transverse colon and ascending colon.      Two sessile polyps were found in the recto-sigmoid colon. The polyps  were 1 to 2 mm in size. These polyps were removed with a cold biopsy       forceps. Resection and retrieval were  complete.      The retroflexed view of the distal rectum and anal verge was normal and       showed no anal or rectal abnormalities.      The digital rectal exam was normal. Impression:           - Preparation of the colon was fair.                       - Diverticulosis in the sigmoid colon, in the                        descending colon, in the transverse colon and in the                        ascending colon.                       - Two 1 to 2 mm polyps at the recto-sigmoid colon,                        removed with a cold biopsy forceps. Resected and                        retrieved.                       - The distal rectum and anal verge are normal on                        retroflexion view. Recommendation:       - Await pathology results.                       - Repeat colonoscopy in 3 years for adenoma                        surveillance. Procedure Code(s):    --- Professional ---                       [id number], Colonoscopy, flexible; with biopsy, single or                        multiple Diagnosis Code(s):    --- Professional ---                       K63.5, Polyp of colon                       Z86.010, Personal history of colonic polyps                       K57.30, Diverticulosis of large intestine without                        perforation or abscess without bleeding CPT copyright 2019 American Medical Association. All rights reserved. The codes documented in this report are preliminary and upon coder review may  be revised to meet current compliance requirements. Christena DeemMartin U Sharda Keddy, MD 04/26/2019 1:53:01 PM  This report has been signed electronically. Number of Addenda: 0 Note Initiated On: 04/26/2019 1:06 PM Scope Withdrawal Time: 0 hours 13 minutes 34 seconds  Total Procedure Duration: 0 hours 27 minutes 49 seconds       [facility]

## 2019-04-26 NOTE — Anesthesia Preprocedure Evaluation (Signed)
Anesthesia Evaluation  Patient identified by MRN, date of birth, ID band Patient awake    Reviewed: Allergy & Precautions, H&P , NPO status , Patient's Chart, lab work & pertinent test results, reviewed documented beta blocker date and time   Airway Mallampati: II  TM Distance: >3 FB Neck ROM: full    Dental no notable dental hx. (+) Teeth Intact   Pulmonary asthma , COPD, former smoker,    Pulmonary exam normal breath sounds clear to auscultation       Cardiovascular Exercise Tolerance: Poor hypertension, Pt. on home beta blockers and Pt. on medications + CAD, + Peripheral Vascular Disease and +CHF  + Cardiac Defibrillator  Rhythm:regular Rate:Normal     Neuro/Psych Anxiety  Neuromuscular disease negative psych ROS   GI/Hepatic negative GI ROS, Neg liver ROS, GERD  Medicated,  Endo/Other  negative endocrine ROSdiabetes  Renal/GU Renal InsufficiencyRenal disease     Musculoskeletal  (+) Arthritis , Osteoarthritis,    Abdominal   Peds negative pediatric ROS (+)  Hematology negative hematology ROS (+)   Anesthesia Other Findings Past Medical History: No date: Abnormal chest CT No date: Anaphylactic reaction No date: Benign hypertension No date: Cardiomyopathy No date: CHF (congestive heart failure) (HCC) No date: Chronic renal insufficiency     Comment:  With creatinine 1.4 No date: GERD (gastroesophageal reflux disease) No date: Hives     Comment:  Long Hx of hives No date: Hyperlipidemia 02/15/2009: IMPLANTATION OF DEFIBRILLATOR, HX OF     Comment:  Qualifier: Diagnosis of  By: Stanford Breed, MD, Kandyce Rud  No date: NSVT (nonsustained ventricular tachycardia) (HCC) No date: Osteoarthritis No date: Osteoarthrosis, unspecified whether generalized or localized,  lower leg No date: Osteoporosis No date: Oxygen deficiency No date: Psoriasis No date: PVC (premature ventricular  contraction) No date: 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  EF 35-40%  Reproductive/Obstetrics negative OB ROS                             Anesthesia Physical  Anesthesia Plan  ASA: III  Anesthesia Plan: General   Post-op Pain Management:    Induction: Intravenous  PONV Risk Score and Plan: Propofol infusion  Airway Management Planned: Nasal Cannula  Additional Equipment:   Intra-op Plan:   Post-operative Plan:   Informed Consent: I have reviewed the patients History and Physical, chart, labs and discussed the procedure including the risks, benefits and alternatives for the proposed anesthesia with the patient or authorized representative who has indicated his/her understanding and acceptance.     Dental advisory given  Plan Discussed with: CRNA and Surgeon  Anesthesia Plan Comments:         Anesthesia Quick Evaluation

## 2019-04-27 ENCOUNTER — Ambulatory Visit (INDEPENDENT_AMBULATORY_CARE_PROVIDER_SITE_OTHER): Payer: Medicare Other | Admitting: General Practice

## 2019-04-27 ENCOUNTER — Other Ambulatory Visit: Payer: Self-pay

## 2019-04-27 ENCOUNTER — Encounter: Payer: Self-pay | Admitting: Gastroenterology

## 2019-04-27 VITALS — BP 128/62 | HR 53 | Ht 69.0 in | Wt 193.0 lb

## 2019-04-27 DIAGNOSIS — I712 Thoracic aortic aneurysm, without rupture, unspecified: Secondary | ICD-10-CM

## 2019-04-27 DIAGNOSIS — I428 Other cardiomyopathies: Secondary | ICD-10-CM

## 2019-04-27 DIAGNOSIS — I10 Essential (primary) hypertension: Secondary | ICD-10-CM

## 2019-04-27 DIAGNOSIS — N183 Chronic kidney disease, stage 3 unspecified: Secondary | ICD-10-CM

## 2019-04-27 DIAGNOSIS — E7800 Pure hypercholesterolemia, unspecified: Secondary | ICD-10-CM

## 2019-04-27 DIAGNOSIS — I5022 Chronic systolic (congestive) heart failure: Secondary | ICD-10-CM

## 2019-04-27 LAB — SURGICAL PATHOLOGY

## 2019-04-27 NOTE — Progress Notes (Signed)
Cardiology Clinic Note   Patient Name: [name] Date of Encounter: 04/27/2019  Primary Care Provider:  [name], MD Primary Cardiologist:  Olga Millers, MD  Patient Profile    [name] 73 year old male presents for follow-up of his congestive heart failure and cardiomyopathy.  Past Medical History    Past Medical History:  Diagnosis Date  . Abnormal chest CT   . Anaphylactic reaction   . Anxiety   . Asthma   . Benign hypertension   . Cardiomyopathy   . Cardiomyopathy, idiopathic (HCC)   . CHF (congestive heart failure) (HCC)   . Chronic renal insufficiency    With creatinine 1.4  . Drug-induced urticaria   . FH: thoracic aortic aneurysm   . GERD (gastroesophageal reflux disease)   . History of colon polyps   . Hives    Long Hx of hives  . Hyperlipidemia   . Hyperlipidemia   . IMPLANTATION OF DEFIBRILLATOR, HX OF 02/15/2009   Qualifier: Diagnosis of  By: Jens Som, MD, Lyn Hollingshead   . NSVT (nonsustained ventricular tachycardia) (HCC)   . Osteoarthritis   . Osteoarthrosis, unspecified whether generalized or localized, lower leg   . Osteoporosis   . Oxygen deficiency   . Psoriasis   . PVC (premature ventricular contraction)   . 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 APPENDECTOMY    . BIV ICD GENERTAOR CHANGE OUT N/A 10/08/2012   Procedure: BIV ICD GENERTAOR CHANGE OUT;  Surgeon: Marinus Maw, MD;  Location: [facility] CATH LAB;  Service: Cardiovascular;  Laterality: N/A;  . CARDIAC CATHETERIZATION    . CARDIAC DEFIBRILLATOR REMOVAL  2014  . CATARACT EXTRACTION    . CATARACT EXTRACTION W/PHACO Left 03/12/2016   Procedure: CATARACT EXTRACTION PHACO AND INTRAOCULAR LENS PLACEMENT (IOC);  Surgeon: Sallee Lange, MD;  Location: [facility] ORS;  Service: Ophthalmology;  Laterality: Left;  [place] 01:32 AP% 25.7 CDE 41.33 fluid pack lot # [id number]  . COLONOSCOPY WITH PROPOFOL N/A 04/26/2019    Procedure: COLONOSCOPY WITH PROPOFOL;  Surgeon: Christena Deem, MD;  Location: [facility] ENDOSCOPY;  Service: Endoscopy;  Laterality: N/A;  . ESOPHAGOGASTRODUODENOSCOPY    . EYE SURGERY    . Implantation of dual-chamber ICD.     [employer] Jude DDD-ICD 1/07  . JOINT REPLACEMENT Right 08/16/2009  . KNEE ARTHROPLASTY Left 2006  . KNEE ARTHROPLASTY Right 12/10   x2  . LAPAROSCOPIC APPENDECTOMY N/A 08/12/2018   Procedure: APPENDECTOMY LAPAROSCOPIC;  Surgeon: Henrene Dodge, MD;  Location: [facility] ORS;  Service: General;  Laterality: N/A;  . TONSILLECTOMY      Allergies  Allergies  Allergen Reactions  . 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 Aspirin Other (See Comments)    Other Reaction: Other reaction  . Indocin [Indomethacin] Other (See Comments)  . Mobic [Meloxicam] Other (See Comments)  . Nsaids Other (See Comments)    Other Reaction: Other reaction Other Reaction: Other reaction    History of Present Illness    [name] was last seen by [name] on 12/20/2018.  During that time he was doing well and denied dyspnea on exertion, orthopnea, PND, lower extremity edema, palpitations and chest pain.  He underwent cardiac catheterization at [facility] in 2004 which showed normal coronary arteries and an ejection fraction of 24.  His cardiomyopathy was felt to  be secondary to ventricular ectopy which she was taking amiodarone for.  A Holter monitor from December 2012 showed frequent PVCs and nonsustained VT.  He had a previous ICD (generator removed in February 2014 his device had reached ERI and his LV function had improved).  An abdominal ultrasound on 1/15 showed no aneurysm.  A CTA on 5/19 showed thoracic aortic aneurysm measuring 4.3 cm.  His last echocardiogram 10/19 showed ejection fraction of 30 to 35%, mild diastolic dysfunction, mild aortic insufficiency, mitral  regurgitation, severe left atrial enlargement and mild tricuspid regurgitation.  He also had a laparoscopic appendectomy 12/19 for acute appendicitis.  His PMH also includes aortic atherosclerosis, BPH, chronic systolic heart failure, thoracic ascending aortic aneurysm, lung nodule, asthma, cholelithiasis, DDD, CKD stage III, rheumatic fever, and vitamin B12 deficiency.He presents the clinic today and states he feels well.  He has been working outside using his chainsaw, doing yoga once a week, and and maintaining his 5 acre property.  He also states he has lost a significant amount of weight through monitoring his diet.  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  Finally, he is happy about his recent CT showing a stable 4.2 cm a sending aortic aneurysm.  He denies chest pain, shortness of breath, lower extremity edema, fatigue, palpitations, melena, hematuria, hemoptysis, diaphoresis, weakness, presyncope, syncope, orthopnea, and PND.   Home Medications    Prior to Admission medications   Medication Sig Start Date End Date Taking? Authorizing Provider  amiodarone (PACERONE) 200 MG tablet TAKE 1 TABLET BY MOUTH EVERY DAY Patient taking differently: Take 200 mg by mouth daily.  08/12/18   Lewayne Buntingrenshaw, Brian S, MD  atorvastatin (LIPITOR) 40 MG tablet TAKE 1/2 TABLET (20 MG TOTAL) BY MOUTH DAILY. 03/03/19   Lewayne Buntingrenshaw, Brian S, MD  carvedilol (COREG) 25 MG tablet TAKE 2 TABLETS (50 MG TOTAL) BY MOUTH 2 (TWO) TIMES DAILY WITH A MEAL. 02/21/19   Lewayne Buntingrenshaw, Brian S, MD  cetirizine (ZYRTEC) 10 MG tablet Take 10 mg by mouth daily.    [provider]  Cyanocobalamin (VITAMIN B12 SL) Place 1 tablet under the tongue daily.    [provider]  DENTA 5000 PLUS 1.1 % CREA dental cream See admin instructions. 07/18/18   [provider]  diphenhydrAMINE (BENADRYL) 25 mg capsule Take 25 mg by mouth every 6 (six) hours as  needed.    [provider]  EPINEPHrine 0.3 mg/0.3 mL IJ SOAJ injection Inject 0.3 mg into the muscle as needed for anaphylaxis.    [provider]  famotidine (PEPCID) 40 MG tablet Take 40 mg by mouth 2 (two) times daily. 11/02/18   [provider]  furosemide (LASIX) 20 MG tablet Take by mouth. 10/13/18   [provider]  gabapentin (NEURONTIN) 100 MG capsule Take 1 capsule (100 mg total) by mouth 2 (two) times daily AND 3 capsules (300 mg total) at bedtime. Max: 5/day. 03/21/19 03/20/20  Delano MetzNaveira, Francisco, MD  hydrALAZINE (APRESOLINE) 50 MG tablet TAKE 1 & 1/2 TABLET BY MOUTH 3 TIMES DAILY Patient taking differently: No sig reported 10/25/18   Lewayne Buntingrenshaw, Brian S, MD  indapamide (LOZOL) 2.5 MG tablet Take 2.5 mg by mouth daily.    [provider]  isosorbide mononitrate (IMDUR) 30 MG 24 hr tablet Take 1 tablet (30 mg total) by mouth daily. 03/21/13 04/26/19  Lewayne Buntingrenshaw, Brian S, MD  levocetirizine (XYZAL) 5 MG tablet Take  5 mg by mouth every evening.    [provider]  metoprolol tartrate (LOPRESSOR) 50 MG tablet Take 50 mg by mouth 2 (two) times daily.    [provider]  montelukast (SINGULAIR) 10 MG tablet Take 10 mg by mouth at bedtime.    [provider]  pantoprazole (PROTONIX) 40 MG tablet Take 40 mg by mouth daily.     [provider]  potassium chloride (K-DUR) 10 MEQ tablet Take 1 tablet (10 mEq total) by mouth daily. 11/29/18   Lewayne Buntingrenshaw, Brian S, MD  predniSONE (DELTASONE) 20 MG tablet Take 20 mg by mouth daily with breakfast.    [provider]    Family History    Family History  Problem Relation Age of Onset  . Cancer Mother   . Lung cancer Mother   . Heart attack Father    He indicated that his mother is deceased. He indicated that his father is deceased.  Social History    Social History   Socioeconomic History  . Marital status: Married    Spouse name: Not on file  . Number of children:  Not on file  . Years of education: Not on file  . Highest education level: Not on file  Occupational History  . Not on file  Social Needs  . Financial resource strain: Not hard at all  . Food insecurity    Worry: Patient refused    Inability: Patient refused  . Transportation needs    Medical: Patient refused    Non-medical: Patient refused  Tobacco Use  . Smoking status: Former Smoker    Packs/day: 1.00    Years: 10.00    Pack years: 10.00    Types: Cigarettes    Quit date: 12/31/1970    Years since quitting: 48.3  . Smokeless tobacco: Never Used  Substance and Sexual Activity  . Alcohol use: Yes    Alcohol/week: 0.0 standard drinks    Comment: 2 DRINKS PER DAY, none this week (7/26)  . Drug use: Never  . Sexual activity: Not Currently  Lifestyle  . Physical activity    Days per week: Patient refused    Minutes per session: Patient refused  . Stress: Not at all  Relationships  . Social Musicianconnections    Talks on phone: Patient refused    Gets together: Patient refused    Attends religious service: Patient refused    Active member of club or organization: Patient refused    Attends meetings of clubs or organizations: Patient refused    Relationship status: Patient refused  . Intimate partner violence    Fear of current or ex partner: No    Emotionally abused: No    Physically abused: No    Forced sexual activity: No  Other Topics Concern  . Not on file  Social History Narrative  . Not on file     Review of Systems    General:  No chills, fever, night sweats or weight changes.  Cardiovascular:  No chest pain, dyspnea on exertion, edema, orthopnea, palpitations, paroxysmal nocturnal dyspnea. Dermatological: No rash, lesions/masses Respiratory: No cough, dyspnea Urologic: No hematuria, dysuria Abdominal:   No nausea, vomiting, diarrhea, bright red blood per rectum, melena, or hematemesis Neurologic:  No visual changes, wkns, changes in mental status. All other  systems reviewed and are otherwise negative except as noted above.  Physical Exam    VS:  BP 128/62   Pulse (!) 53   Ht 5\' 9"  (1.753 m)  Wt 193 lb (87.5 kg)   BMI 28.50 kg/m  , BMI Body mass index is 28.5 kg/m. GEN: Well nourished, well developed, in no acute distress. HEENT: normal. Neck: Supple, no JVD, carotid bruits, or masses. Cardiac: RRR, no murmurs, rubs, or gallops. No clubbing, cyanosis, edema.  Radials/DP/PT 2+ and equal bilaterally.  Respiratory:  Respirations regular and unlabored, clear to auscultation bilaterally. GI: Soft, nontender, nondistended, BS + x 4. MS: no deformity or atrophy. Skin: warm and dry, no rash. Neuro:  Strength and sensation are intact. Psych: Normal affect.  Accessory Clinical Findings    ECG personally reviewed by me today-sinus bradycardia with first-degree AV block 53 bpm- No acute changes  EKG 05/10/2018 Sinus bradycardia with first-degree AV block with ventricular escape complexes 55 bpm  CT angio chest 02/14/2019 IMPRESSION: 1. Stable ectatic 4.2 cm ascending thoracic aorta. Recommend annual imaging followup by CTA or MRA. This recommendation follows 2010 ACCF/AHA/AATS/ACR/ASA/SCA/SCAI/SIR/STS/SVM Guidelines for the Diagnosis and Management of Patients with Thoracic Aortic Disease. Circulation. 2010; 121: Z610-R604: E266-e369. Aortic aneurysm NOS (ICD10-I71.9). 2. One vessel coronary atherosclerosis. 3. Cholelithiasis. 4. Small hiatal hernia. 5. Colonic diverticulosis.  Echocardiogram 06/04/2018 Study Conclusions  - Left ventricle: The cavity size was mildly dilated. There was   moderate concentric hypertrophy. Systolic function was moderately   reduced. The estimated ejection fraction was in the range of 35%   to 40%. There is severe hypokinesis of the basal and mid   inferoseptal, inferior, inferolateral and apical septal and   inferior walls. Doppler parameters are consistent with abnormal   left ventricular relaxation (grade 1  diastolic dysfunction).   Doppler parameters are consistent with elevated ventricular   end-diastolic filling pressure. - Ventricular septum: Septal motion showed paradox. - Aortic valve: Trileaflet; moderately thickened, moderately   calcified leaflets. Valve mobility was restricted. There was mild   regurgitation. - Mitral valve: There was mild regurgitation. - Left atrium: The atrium was severely dilated. - Right ventricle: Pacer wire or catheter noted in right ventricle. - Right atrium: Pacer wire or catheter noted in right atrium. - Tricuspid valve: There was mild regurgitation. - Pulmonary arteries: Systolic pressure was mildly increased. PA   peak pressure: 37 mm Hg (S). - Inferior vena cava: The vessel was normal in size. - Pericardium, extracardiac: There was no pericardial effusion.  Assessment & Plan   1.  Nonischemic cardiomyopathy- history of allergy to ACE and ARB medications.  Denies shortness of breath or activity intolerance.  Has been very active around his house and is euvolemic today. Continue hydralazine 50 mg twice daily Continue isosorbide mononitrate 30 mg daily Continue carvedilol 25 mg twice daily Continue furosemide 20 mg daily Heart healthy low-sodium diet Increase physical activity as tolerated  2.  Chronic systolic congestive heart failure-echocardiogram 10/19 showed ejection fraction of  35% to 40%, mild diastolic dysfunction, mild aortic insufficiency, mitral regurgitation, severe left atrial enlargement and mild tricuspid regurgitation. Continue isosorbide mononitrate 30 mg daily Continue carvedilol 25 mg twice daily Continue furosemide 20 mg daily Heart healthy low-sodium diet Increase physical activity as tolerated  3.  Essential hypertension-BP today 128/62 Continue isosorbide mononitrate 30 mg daily Continue carvedilol 25 mg twice daily Continue furosemide 20 mg daily Continue hydralazine 50 mg twice daily Heart healthy low-sodium diet  Increase physical activity as tolerated  4.  Thoracic aortic aneurysm- CT angios showed stable 4.2 cm AAA. Recommend annual CT  5.  Hyperlipidemia-LDL 116 07/22/2007 Continue atorvastatin 20 mg tablet daily  6.  CKD III- Lab Results  Component Value Date   CREATININE 1.53 (H) 02/17/2019   CREATININE 1.25 (H) 08/13/2018   CREATININE 1.26 (H) 08/12/2018  Last value 04/06/2019 creatinine stable 1.4 Followed by PCP  Disposition: Follow-up with Dr. Stanford Breed in 4 months.  Deberah Pelton, NP-C 04/27/2019, 4:19 PM

## 2019-04-27 NOTE — Patient Instructions (Signed)
Medication Instructions:  Your physician recommends that you continue on your current medications as directed. Please refer to the Current Medication list given to you today. If you need a refill on your cardiac medications before your next appointment, please call your pharmacy.   Lab work: NONE  If you have labs (blood work) drawn today and your tests are completely normal, you will receive your results only by: . MyChart Message (if you have MyChart) OR . A paper copy in the mail If you have any lab test that is abnormal or we need to change your treatment, we will call you to review the results.  Testing/Procedures: NONE   Follow-Up: At [facility], you and your health needs are our priority.  As part of our continuing mission to provide you with exceptional heart care, we have created designated Provider Care Teams.  These Care Teams include your primary Cardiologist (physician) and Advanced Practice Providers (APPs -  Physician Assistants and Nurse Practitioners) who all work together to provide you with the care you need, when you need it. You will need a follow up appointment in 4 months.  Please call our office 2 months in advance to schedule this appointment.  You may see Brian Crenshaw, MD or one of the following Advanced Practice Providers on your designated Care Team:   Luke Kilroy, PA-C Krista Kroeger, PA-C . Callie Goodrich, PA-C  Any Other Special Instructions Will Be Listed Below (If Applicable).   

## 2019-07-14 IMAGING — CT CT CHEST W/ CM
2 of 5 series · 13 of 46 positions shown, 15 images · IV contrast (iopamidol)
Comparison: CT scan of August 02, 2015.

CLINICAL DATA: Chest pain and left upper quadrant abdominal pain
for several months. Shortness of breath.

EXAM:
CT CHEST, ABDOMEN, AND PELVIS WITH CONTRAST
TECHNIQUE: Multidetector CT imaging of the chest, abdomen and pelvis was
performed following the standard protocol during bolus
administration of intravenous contrast.
CONTRAST:  100mL [id number] IOPAMIDOL ([id number]) INJECTION 61%

[Series 2: cap with · axial · 0.79mm/px · z∈[-1524,-959]mm · 10 of 139 slices shown, 12 images (1 of 2)]
[im 13/139  soft-tissue]
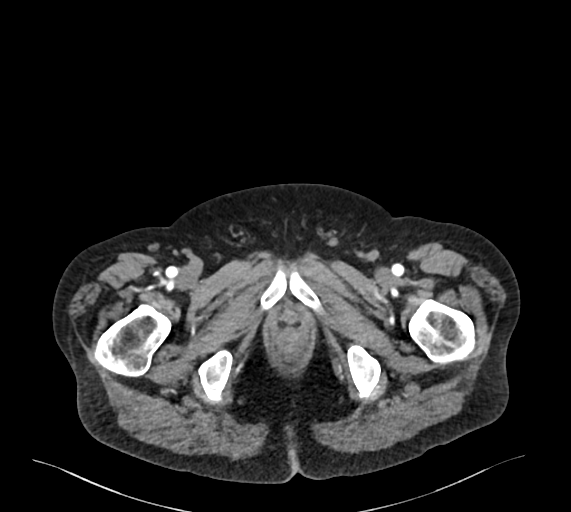
[im 13/139  bone]
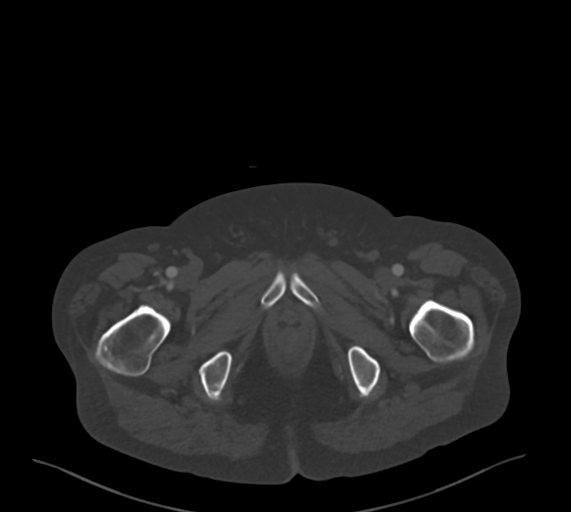
[im 26/139  soft-tissue]
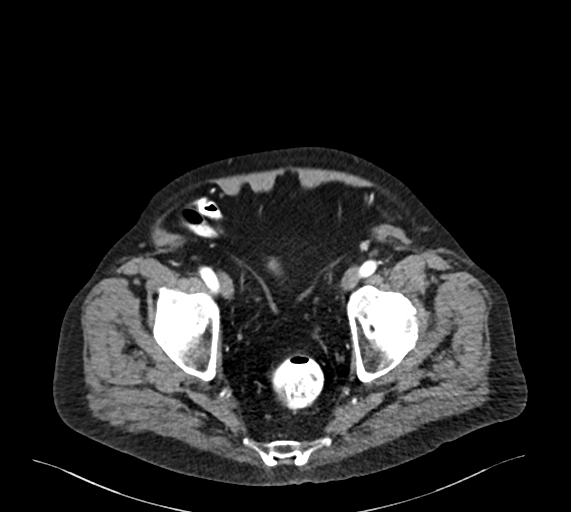
[im 38/139  soft-tissue]
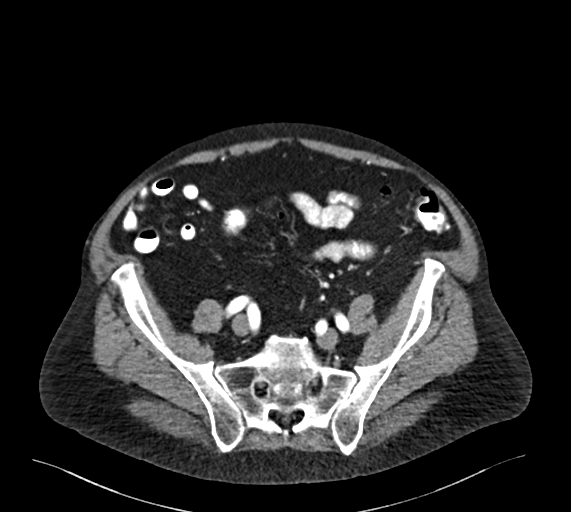
[im 51/139  soft-tissue]
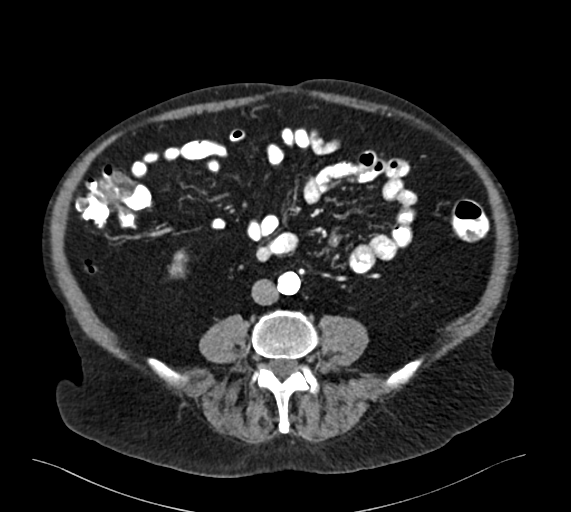
[im 63/139  soft-tissue]
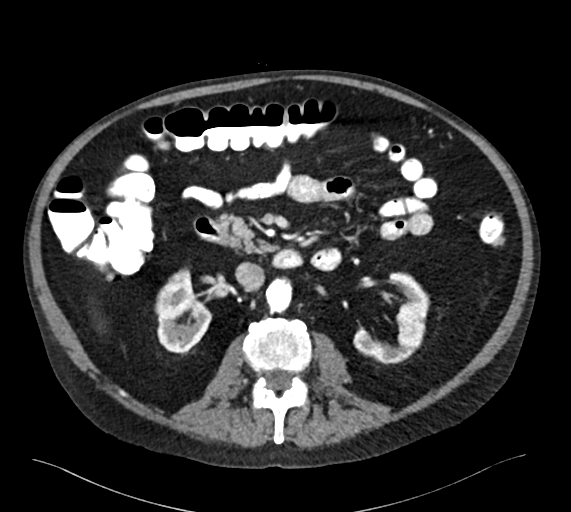
[im 76/139  soft-tissue]
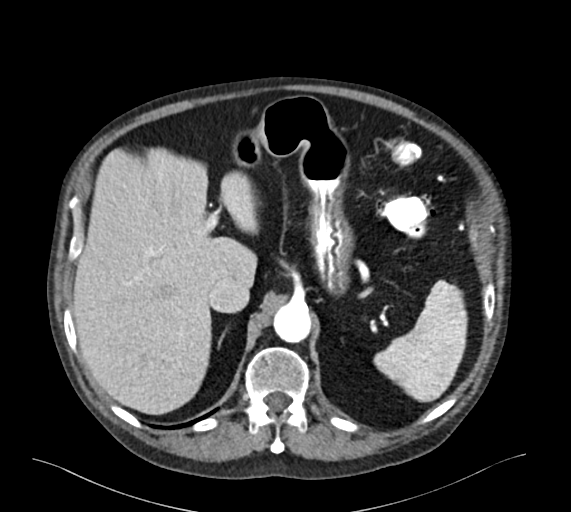
[im 88/139  soft-tissue]
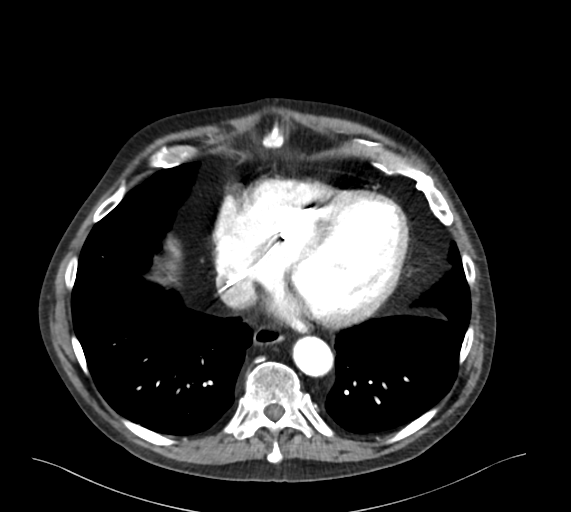
[im 101/139  soft-tissue]
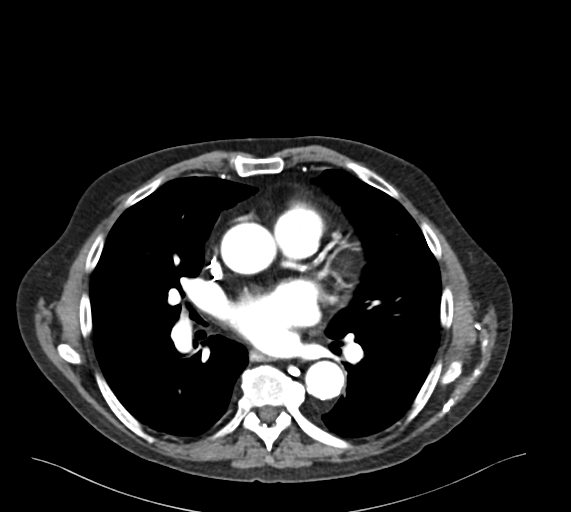
[im 113/139  soft-tissue]
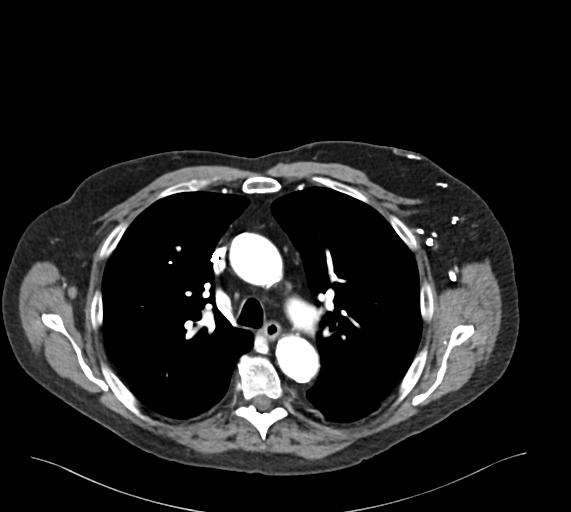
[im 113/139  bone]
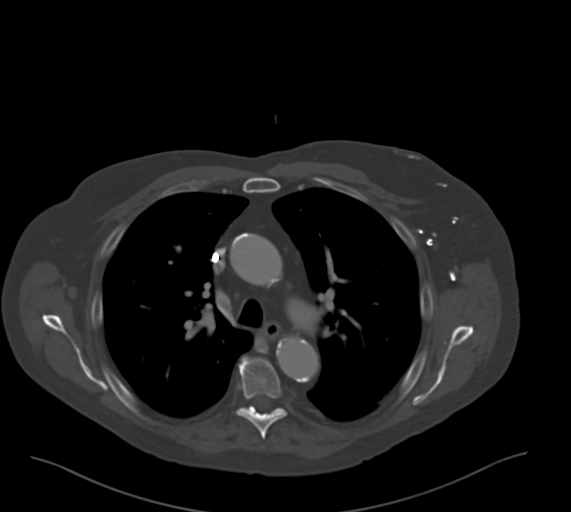
[im 126/139  soft-tissue]
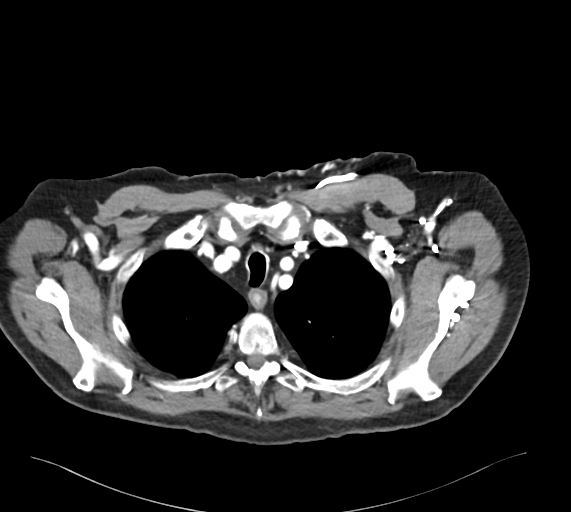

[Series 6: cap with · coronal · 0.88mm/px · 3 of 171 slices shown (2 of 2)]
[im 57/171  soft-tissue]
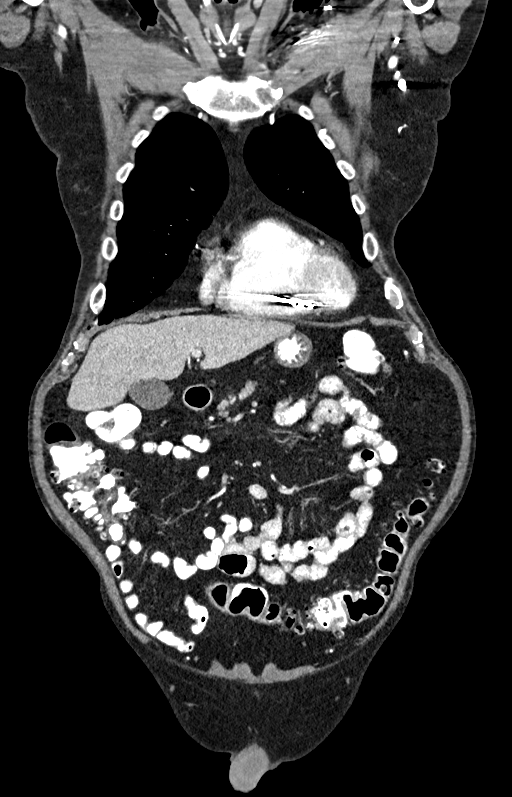
[im 76/171  soft-tissue]
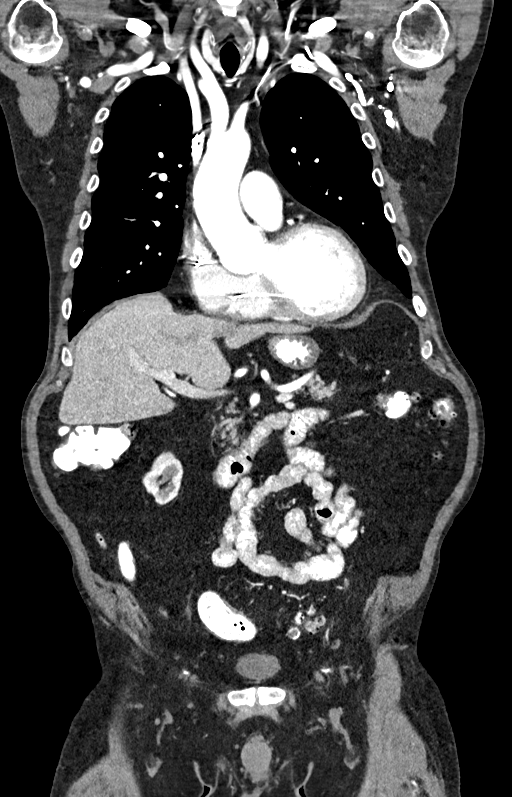
[im 95/171  soft-tissue]
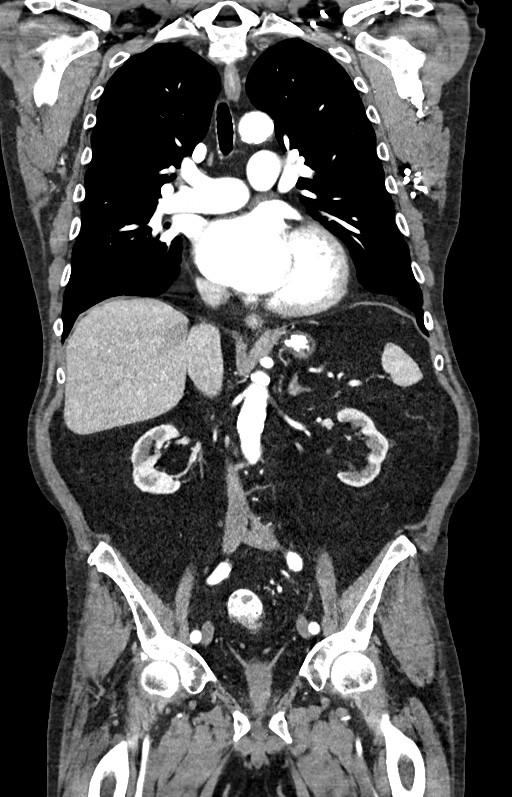

[13 of 46 positions shown; findings below may reference images not displayed]

FINDINGS: CT CHEST FINDINGS

Cardiovascular: 4.2 cm ascending thoracic aortic aneurysm is noted.
No dissection is noted. Atherosclerosis of thoracic aorta is noted.
Great vessels are widely patent. No pericardial effusion is noted.

Mediastinum/Nodes: No enlarged mediastinal, hilar, or axillary lymph
nodes. Thyroid gland, trachea, and esophagus demonstrate no
significant findings.

Lungs/Pleura: Lungs are clear. No pleural effusion or pneumothorax.

Musculoskeletal: No chest wall mass or suspicious bone lesions
identified.

CT ABDOMEN PELVIS FINDINGS

Hepatobiliary: Minimal cholelithiasis is noted. Liver is
unremarkable. No biliary dilatation is noted.

Pancreas: Unremarkable. No pancreatic ductal dilatation or
surrounding inflammatory changes.

Spleen: Normal in size without focal abnormality.

Adrenals/Urinary Tract: Adrenal glands are unremarkable. Kidneys are
normal, without renal calculi, focal lesion, or hydronephrosis.
Bladder is unremarkable.

Stomach/Bowel: Stomach is within normal limits. Appendix appears
normal. No evidence of bowel wall thickening, distention, or
inflammatory changes. Sigmoid diverticulosis is noted without
inflammation.

Vascular/Lymphatic: Aortic atherosclerosis. No enlarged abdominal or
pelvic lymph nodes.

Reproductive: Prostate is unremarkable.

Other: No abdominal wall hernia or abnormality. No abdominopelvic
ascites.

Musculoskeletal: No acute or significant osseous findings.
IMPRESSION: 4.2 cm ascending thoracic aortic aneurysm. Recommend annual imaging
followup by CTA or MRA. This recommendation follows 2999
ACCF/AHA/AATS/ACR/ASA/SCA/NIIHO/MARC SAMUEL/MGBEAHURU/BATYA Guidelines for the
Diagnosis and Management of Patients with Thoracic Aortic Disease.
Circulation. 2999; 121: [id number].

Minimal cholelithiasis without inflammation.

Sigmoid diverticulosis without inflammation.

Aortic Atherosclerosis ([id number].I).

## 2019-08-02 ENCOUNTER — Other Ambulatory Visit: Payer: Self-pay | Admitting: Cardiology

## 2019-08-02 NOTE — Progress Notes (Signed)
HPI: FU nonischemic cardiomyopathy. Cardiac cath at [facility] in 2004 showed normal coronaries and EF 24; endocardial biopsy unrevealing. 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 Previous holter monitor in Dec 2012 showed frequent PVCs and nonsustained ventricular tachycardia. He also has had a previous ICD (generator removed in February of 2014 as his device had reached ERI and his LV function had improved). Abd ultrasound 1/15 showed no aneurysm.Last echocardiogram October 2019 showed ejection fraction 35 to 40%, mild diastolic dysfunction, mild aortic insufficiency and mitral regurgitation, severe left atrial enlargement and mild tricuspid regurgitation. CTA June 2020 showed 4.2 cm thoracic aortic aneurysm.  Since last seen,the patient denies any dyspnea on exertion, orthopnea, PND, pedal edema, palpitations, syncope or chest pain.   Current Outpatient Medications  Medication Sig Dispense Refill  . amiodarone (PACERONE) 200 MG tablet Take 1 tablet (200 mg total) by mouth daily. 90 tablet 1  . atorvastatin (LIPITOR) 40 MG tablet TAKE 1/2 TABLET (20 MG TOTAL) BY MOUTH DAILY. 45 tablet 3  . carvedilol (COREG) 25 MG tablet TAKE 2 TABLETS (50 MG TOTAL) BY MOUTH 2 (TWO) TIMES DAILY WITH A MEAL. 360 tablet 3  . DENTA 5000 PLUS 1.1 % CREA dental cream See admin instructions.    . diphenhydrAMINE (BENADRYL) 25 mg capsule Take 25 mg by mouth every 6 (six) hours as needed.    Marland Kitchen EPINEPHrine 0.3 mg/0.3 mL IJ SOAJ injection Inject 0.3 mg into the muscle as needed for anaphylaxis.    . famotidine (PEPCID) 40 MG tablet Take 40 mg by mouth 2 (two) times daily.    . furosemide (LASIX) 20 MG tablet Take by mouth.    . gabapentin (NEURONTIN) 100 MG capsule Take 1 capsule (100 mg total) by mouth 2 (two) times daily AND 3 capsules (300 mg total) at bedtime. Max: 5/day. 150 capsule 11  . hydrALAZINE (APRESOLINE) 50 MG tablet Take 50 mg  by mouth 2 (two) times daily.    Marland Kitchen levocetirizine (XYZAL) 5 MG tablet Take 5 mg by mouth every evening.    . montelukast (SINGULAIR) 10 MG tablet Take 10 mg by mouth at bedtime.    . potassium chloride (K-DUR) 10 MEQ tablet Take 1 tablet (10 mEq total) by mouth daily. 90 tablet 3  . predniSONE (DELTASONE) 20 MG tablet Take 20 mg by mouth daily with breakfast.    . isosorbide mononitrate (IMDUR) 30 MG 24 hr tablet Take 1 tablet (30 mg total) by mouth daily.     No current facility-administered medications for this visit.     Past Medical History:  Diagnosis Date  . Abnormal chest CT   . Anaphylactic reaction   . Anxiety   . Asthma   . Benign hypertension   . Cardiomyopathy   . Cardiomyopathy, idiopathic (HCC)   . CHF (congestive heart failure) (HCC)   . Chronic renal insufficiency    With creatinine 1.4  . Drug-induced urticaria   . FH: thoracic aortic aneurysm   . GERD (gastroesophageal reflux disease)   . History of colon polyps   . Hives    Long Hx of hives  . Hyperlipidemia   . Hyperlipidemia   . IMPLANTATION OF DEFIBRILLATOR, HX OF 02/15/2009   Qualifier: Diagnosis of  By: Jens Som, MD, Lyn Hollingshead   . NSVT (nonsustained ventricular tachycardia) (HCC)   . Osteoarthritis   . Osteoarthrosis, unspecified whether generalized or localized, lower leg   .  Osteoporosis   . Oxygen deficiency   . Psoriasis   . PVC (premature ventricular contraction)   . 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 APPENDECTOMY    . BIV ICD GENERTAOR CHANGE OUT N/A 10/08/2012   Procedure: BIV ICD GENERTAOR CHANGE OUT;  Surgeon: Evans Lance, MD;  Location: [facility] CATH LAB;  Service: Cardiovascular;  Laterality: N/A;  . CARDIAC CATHETERIZATION    . CARDIAC DEFIBRILLATOR REMOVAL  2014  . CATARACT EXTRACTION    . CATARACT EXTRACTION W/PHACO Left 03/12/2016   Procedure: CATARACT EXTRACTION PHACO AND INTRAOCULAR LENS PLACEMENT (IOC);   Surgeon: Estill Cotta, MD;  Location: [facility] ORS;  Service: Ophthalmology;  Laterality: Left;  [place] 01:32 AP% 25.7 CDE 41.33 fluid pack lot # [id number]  . COLONOSCOPY WITH PROPOFOL N/A 04/26/2019   Procedure: COLONOSCOPY WITH PROPOFOL;  Surgeon: Lollie Sails, MD;  Location: [facility] ENDOSCOPY;  Service: Endoscopy;  Laterality: N/A;  . ESOPHAGOGASTRODUODENOSCOPY    . EYE SURGERY    . Implantation of dual-chamber ICD.     [employer] Jude DDD-ICD 1/07  . JOINT REPLACEMENT Right 08/16/2009  . KNEE ARTHROPLASTY Left 2006  . KNEE ARTHROPLASTY Right 12/10   x2  . LAPAROSCOPIC APPENDECTOMY N/A 08/12/2018   Procedure: APPENDECTOMY LAPAROSCOPIC;  Surgeon: Olean Ree, MD;  Location: [facility] ORS;  Service: General;  Laterality: N/A;  . TONSILLECTOMY      Social History   Socioeconomic History  . Marital status: Married    Spouse name: Not on file  . Number of children: Not on file  . Years of education: Not on file  . Highest education level: Not on file  Occupational History  . Not on file  Tobacco Use  . Smoking status: Former Smoker    Packs/day: 1.00    Years: 10.00    Pack years: 10.00    Types: Cigarettes    Quit date: 12/31/1970    Years since quitting: 48.6  . Smokeless tobacco: Never Used  Substance and Sexual Activity  . Alcohol use: Yes    Alcohol/week: 0.0 standard drinks    Comment: 2 DRINKS PER DAY, none this week (7/26)  . Drug use: Never  . Sexual activity: Not Currently  Other Topics Concern  . Not on file  Social History Narrative  . Not on file   Social Determinants of Health   Financial Resource Strain: Low Risk   . Difficulty of Paying Living Expenses: Not hard at all  Food Insecurity: Unknown  . Worried About Charity fundraiser in the Last Year: Patient refused  . Ran Out of Food in the Last Year: Patient refused  Transportation Needs: Unknown  . Lack of Transportation (Medical): Patient refused  . Lack of Transportation (Non-Medical): Patient refused    Physical Activity: Unknown  . Days of Exercise per Week: Patient refused  . Minutes of Exercise per Session: Patient refused  Stress: No Stress Concern Present  . Feeling of Stress : Not at all  Social Connections: Unknown  . Frequency of Communication with Friends and Family: Patient refused  . Frequency of Social Gatherings with Friends and Family: Patient refused  . Attends Religious Services: Patient refused  . Active Member of Clubs or Organizations: Patient refused  . Attends Archivist Meetings: Patient refused  . Marital Status: Patient refused  Intimate Partner Violence: Not At Risk  . Fear of Current or Ex-Partner: No  . Emotionally  Abused: No  . Physically Abused: No  . Sexually Abused: No    Family History  Problem Relation Age of Onset  . Cancer Mother   . Lung cancer Mother   . Heart attack Father     ROS: Arthralgias and rash but no fevers or chills, productive cough, hemoptysis, dysphasia, odynophagia, melena, hematochezia, dysuria, hematuria, rash, seizure activity, orthopnea, PND, pedal edema, claudication. Remaining systems are negative.  Physical Exam: Well-developed well-nourished in no acute distress.  Skin is warm and dry.  HEENT is normal.  Neck is supple.  Chest is clear to auscultation with normal expansion.  Cardiovascular exam is regular rate and rhythm.  Abdominal exam nontender or distended. No masses palpated. Extremities show no edema. neuro grossly intact  A/P  1 nonischemic cardiomyopathy-history of allergy to ACE inhibitors and ARB's.  Continue hydralazine/nitrates and beta-blocker.  2 chronic systolic congestive heart failure-patient doing well from a volume standpoint.  Continue present dose of diuretic.  Potassium and renal function monitored by primary care.  3 hypertension-patient's blood pressure controlled.  Continue present medical regimen and follow.  4 thoracic aortic aneurysm-plan follow-up CTA June 2021.  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  Continue amiodarone.  TSH, liver functions and chest x-ray followed by primary care.  6 hyperlipidemia-continue statin.  7 chronic stage III kidney disease-followed by primary care.  Olga Millers, MD

## 2019-08-02 NOTE — Telephone Encounter (Signed)
Rx request sent to pharmacy.  

## 2019-08-08 ENCOUNTER — Other Ambulatory Visit: Payer: Self-pay

## 2019-08-08 ENCOUNTER — Encounter: Payer: Self-pay | Admitting: Cardiology

## 2019-08-08 ENCOUNTER — Ambulatory Visit: Payer: Medicare Other | Admitting: Cardiology

## 2019-08-08 VITALS — BP 114/58 | HR 96 | Temp 97.2°F | Ht 69.0 in | Wt 182.0 lb

## 2019-08-08 DIAGNOSIS — E7800 Pure hypercholesterolemia, unspecified: Secondary | ICD-10-CM

## 2019-08-08 DIAGNOSIS — I428 Other cardiomyopathies: Secondary | ICD-10-CM | POA: Diagnosis not present

## 2019-08-08 DIAGNOSIS — I712 Thoracic aortic aneurysm, without rupture, unspecified: Secondary | ICD-10-CM

## 2019-08-08 DIAGNOSIS — I10 Essential (primary) hypertension: Secondary | ICD-10-CM

## 2019-08-08 NOTE — Patient Instructions (Signed)
Medication Instructions:  NO CHANGES *If you need a refill on your cardiac medications before your next appointment, please call your pharmacy*  Lab Work: NONE NEEDED   Testing/Procedures: Non-Cardiac CT Angiography (CTA), is a special type of CT scan that uses a computer to produce multi-dimensional views of major blood vessels throughout the body. In CT angiography, a contrast material is injected through an IV to help visualize the blood vessels.  Follow-Up: At [facility], you and your health needs are our priority.  As part of our continuing mission to provide you with exceptional heart care, we have created designated Provider Care Teams.  These Care Teams include your primary Cardiologist (physician) and Advanced Practice Providers (APPs -  Physician Assistants and Nurse Practitioners) who all work together to provide you with the care you need, when you need it.  Your next appointment:   6 month(s)  The format for your next appointment:   In Person  Provider:   You may see Kirk Ruths, MD or one of the following Advanced Practice Providers on your designated Care Team:    Kerin Ransom, PA-C  [place], [place]  Coletta Memos, [place]

## 2019-09-13 MED ORDER — FUROSEMIDE 20 MG PO TABS: 20.0000 mg | ORAL_TABLET | Freq: Every day | ORAL | 3 refills | Status: DC

## 2019-11-05 ENCOUNTER — Other Ambulatory Visit: Payer: Self-pay | Admitting: Cardiology

## 2020-01-05 ENCOUNTER — Other Ambulatory Visit: Payer: Self-pay | Admitting: Cardiology

## 2020-01-06 MED ORDER — AMIODARONE HCL 200 MG PO TABS: 200.0000 mg | ORAL_TABLET | Freq: Every day | ORAL | 1 refills | Status: DC

## 2020-01-16 ENCOUNTER — Ambulatory Visit
Admission: EM | Admit: 2020-01-16 | Discharge: 2020-01-16 | Disposition: A | Discharge status: Home / Self Care | Payer: Medicare Other | Attending: Emergency Medicine | Admitting: Emergency Medicine

## 2020-01-16 DIAGNOSIS — L03114 Cellulitis of left upper limb: Secondary | ICD-10-CM | POA: Diagnosis not present

## 2020-01-16 DIAGNOSIS — T22212A Burn of second degree of left forearm, initial encounter: Secondary | ICD-10-CM | POA: Diagnosis not present

## 2020-01-16 MED ORDER — SILVER SULFADIAZINE 1 % EX CREA: 1.0000 "application " | TOPICAL_CREAM | Freq: Every day | CUTANEOUS | 0 refills | Status: DC

## 2020-01-16 MED ORDER — SILVER SULFADIAZINE 1 % EX CREA: TOPICAL_CREAM | Freq: Once | CUTANEOUS | Status: AC

## 2020-01-16 MED ORDER — CEPHALEXIN 500 MG PO CAPS: 500.0000 mg | ORAL_CAPSULE | Freq: Four times a day (QID) | ORAL | 0 refills | Status: AC
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
## 2020-01-16 NOTE — Discharge Instructions (Signed)
Take the antibiotic and use the burn cream as directed.    Call your primary care provider to schedule a recheck in 3 days.    Go to the Emergency Department if you have fever, chills, increased redness, red streaks, or other signs of worsening infection.
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
## 2020-01-17 ENCOUNTER — Encounter: Payer: Self-pay | Admitting: *Deleted

## 2020-01-17 ENCOUNTER — Telehealth: Payer: Self-pay | Admitting: *Deleted

## 2020-01-17 DIAGNOSIS — I712 Thoracic aortic aneurysm, without rupture, unspecified: Secondary | ICD-10-CM

## 2020-01-17 NOTE — Telephone Encounter (Addendum)
Patient is due for CTA to follow up thoracic aneurysm, scheduled for 02-15-20 @ 9 am. Paperwork for bmp mailed to the patient. Patient aware via my chart

## 2020-01-31 LAB — BASIC METABOLIC PANEL
BUN/Creatinine Ratio: 9 — ABNORMAL LOW (ref 10–24)
BUN: 15 mg/dL (ref 8–27)
CO2: 23 mmol/L (ref 20–29)
Calcium: 8.6 mg/dL (ref 8.6–10.2)
Chloride: 103 mmol/L (ref 96–106)
Creatinine, Ser: 1.61 mg/dL — ABNORMAL HIGH (ref 0.76–1.27)
GFR calc Af Amer: 49 mL/min/{1.73_m2} — ABNORMAL LOW (ref >59)
GFR calc non Af Amer: 42 mL/min/{1.73_m2} — ABNORMAL LOW (ref >59)
Glucose: 90 mg/dL (ref 65–99)
Potassium: 4.3 mmol/L (ref 3.5–5.2)
Sodium: 141 mmol/L (ref 134–144)

## 2020-02-01 ENCOUNTER — Telehealth: Payer: Self-pay | Admitting: *Deleted

## 2020-02-01 DIAGNOSIS — I712 Thoracic aortic aneurysm, without rupture, unspecified: Secondary | ICD-10-CM

## 2020-02-01 NOTE — Telephone Encounter (Addendum)
-----   Message from Lewayne Bunting, MD sent at 01/31/2020 12:16 PM EDT ----- Would change CTA to noncontrast for TAA Olga Millers  Spoke with [facility] and appointment changed to ct wo contrast. Appointment rescheduled to 02-15-20 @ 9 am. Patient made aware.

## 2020-02-15 ENCOUNTER — Other Ambulatory Visit: Payer: Medicare Other

## 2020-02-15 ENCOUNTER — Ambulatory Visit
Admission: RE | Admit: 2020-02-15 | Discharge: 2020-02-15 | Disposition: A | Discharge status: Home / Self Care | Payer: Medicare Other | Source: Ambulatory Visit | Attending: Cardiology | Admitting: Cardiology

## 2020-02-15 DIAGNOSIS — I712 Thoracic aortic aneurysm, without rupture, unspecified: Secondary | ICD-10-CM

## 2020-02-17 ENCOUNTER — Telehealth: Payer: Self-pay | Admitting: *Deleted

## 2020-02-17 DIAGNOSIS — R911 Solitary pulmonary nodule: Secondary | ICD-10-CM

## 2020-02-17 DIAGNOSIS — R9389 Abnormal findings on diagnostic imaging of other specified body structures: Secondary | ICD-10-CM

## 2020-02-17 NOTE — Telephone Encounter (Addendum)
-----  Message from Lelon Perla, MD sent at 02/16/2020  7:24 AM EDT ----- Hold amiodarone due to potential lung side effects; schedule ESR; schedule fu noncontrast chest CT 3 months. Kirk Ruths  Spoke with pt, Aware of dr Jacalyn Lefevre recommendations. Orders placed for ct and labs.

## 2020-02-25 LAB — SEDIMENTATION RATE: Sed Rate: 12 mm/hr (ref 0–30)

## 2020-03-08 ENCOUNTER — Other Ambulatory Visit: Payer: Self-pay | Admitting: Cardiology

## 2020-03-08 DIAGNOSIS — E785 Hyperlipidemia, unspecified: Secondary | ICD-10-CM

## 2020-03-12 ENCOUNTER — Other Ambulatory Visit: Payer: Self-pay | Admitting: *Deleted

## 2020-03-12 DIAGNOSIS — R911 Solitary pulmonary nodule: Secondary | ICD-10-CM

## 2020-03-12 NOTE — Progress Notes (Signed)
c 

## 2020-03-12 NOTE — Progress Notes (Signed)
HPI: FU nonischemic cardiomyopathy. Cardiac cath at [facility] in 2004 showed normal coronaries and EF 24; endocardial biopsy unrevealing. Note, his cardiomyopathy is felt possibly secondary to ventricular ectopy in the past and he was on amiodarone for that. Previous holter monitor in Dec 2012 showed frequent PVCs and nonsustained ventricular tachycardia. He also has had a previous ICD (generator removed in February of 2014 as his device had reached ERI and his LV function had improved). Abd ultrasound 1/15 showed no aneurysm.Last echocardiogram October 2019 showed ejection fraction 35 to 50%, mild diastolic dysfunction, mild aortic insufficiency and mitral regurgitation, severe left atrial enlargement and mild tricuspid regurgitation. Chest CT June 2021 showed 4.2 cm thoracic aorta, areas of nodularity in the right upper and lower lobe, small pulmonary nodules in the right lower lobe and left chest, emphysema and dilated central pulmonary vasculature. ESR 12. Amiodarone was discontinued.  Follow-up CT recommended 3 months.  Since last seen,the patient has dyspnea with more extreme activities but not with routine activities. It is relieved with rest. It is not associated with chest pain. There is no orthopnea, PND or pedal edema. There is no syncope or palpitations. There is no exertional chest pain.   Current Outpatient Medications  Medication Sig Dispense Refill   atorvastatin (LIPITOR) 40 MG tablet TAKE 1/2 TABLET (20 MG TOTAL) BY MOUTH DAILY. 45 tablet 1   carvedilol (COREG) 25 MG tablet TAKE 2 TABLETS (50 MG TOTAL) BY MOUTH 2 (TWO) TIMES DAILY WITH A MEAL. 360 tablet 3   DENTA 5000 PLUS 1.1 % CREA dental cream See admin instructions.     diphenhydrAMINE (BENADRYL) 25 mg capsule Take 25 mg by mouth every 6 (six) hours as needed.     EPINEPHrine 0.3 mg/0.3 mL IJ SOAJ injection Inject 0.3 mg into the muscle as needed for anaphylaxis.     famotidine (PEPCID) 40 MG tablet Take 40 mg by mouth 2  (two) times daily.     furosemide (LASIX) 20 MG tablet Take 1 tablet (20 mg total) by mouth daily. 90 tablet 3   gabapentin (NEURONTIN) 100 MG capsule Take 1 capsule (100 mg total) by mouth 2 (two) times daily AND 3 capsules (300 mg total) at bedtime. Max: 5/day. 150 capsule 11   hydrALAZINE (APRESOLINE) 50 MG tablet TAKE 1 & 1/2 TABLET BY MOUTH 3 TIMES DAILY 135 tablet 5   levocetirizine (XYZAL) 5 MG tablet Take 5 mg by mouth every evening.     montelukast (SINGULAIR) 10 MG tablet Take 10 mg by mouth at bedtime.     potassium chloride (K-DUR) 10 MEQ tablet Take 1 tablet (10 mEq total) by mouth daily. 90 tablet 3   predniSONE (DELTASONE) 20 MG tablet Take 20 mg by mouth daily with breakfast.     silver sulfADIAZINE (SILVADENE) 1 % cream Apply 1 application topically daily. 50 g 0   isosorbide mononitrate (IMDUR) 30 MG 24 hr tablet Take 1 tablet (30 mg total) by mouth daily.     No current facility-administered medications for this visit.     Past Medical History:  Diagnosis Date   Abnormal chest CT    Anaphylactic reaction    Anxiety    Asthma    Benign hypertension    Cardiomyopathy    Cardiomyopathy, idiopathic (HCC)    CHF (congestive heart failure) (HCC)    Chronic renal insufficiency    With creatinine 1.4   Drug-induced urticaria    FH: thoracic aortic aneurysm  GERD (gastroesophageal reflux disease)    History of colon polyps    Hives    Long Hx of hives   Hyperlipidemia    Hyperlipidemia    IMPLANTATION OF DEFIBRILLATOR, HX OF 02/15/2009   Qualifier: Diagnosis of  By: Stanford Breed, MD, Kandyce Rud    NSVT (nonsustained ventricular tachycardia) ([place])    Osteoarthritis    Osteoarthrosis, unspecified whether generalized or localized, lower leg    Osteoporosis    Oxygen deficiency    Psoriasis    PVC (premature ventricular contraction)    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    Past Surgical History:  Procedure  Laterality Date   APPENDECTOMY     BIV ICD GENERTAOR CHANGE OUT N/A 10/08/2012   Procedure: BIV ICD GENERTAOR CHANGE OUT;  Surgeon: Evans Lance, MD;  Location: [facility] CATH LAB;  Service: Cardiovascular;  Laterality: N/A;   CARDIAC CATHETERIZATION     CARDIAC DEFIBRILLATOR REMOVAL  2014   CATARACT EXTRACTION     CATARACT EXTRACTION W/PHACO Left 03/12/2016   Procedure: CATARACT EXTRACTION PHACO AND INTRAOCULAR LENS PLACEMENT ([place]);  Surgeon: Estill Cotta, MD;  Location: [facility] ORS;  Service: Ophthalmology;  Laterality: Left;  [place] 01:32 AP% 25.7 CDE 41.33 fluid pack lot # [id number]   COLONOSCOPY WITH PROPOFOL N/A 04/26/2019   Procedure: COLONOSCOPY WITH PROPOFOL;  Surgeon: Lollie Sails, MD;  Location: [facility] ENDOSCOPY;  Service: Endoscopy;  Laterality: N/A;   ESOPHAGOGASTRODUODENOSCOPY     EYE SURGERY     Implantation of dual-chamber ICD.     St. Jude DDD-ICD 1/07   JOINT REPLACEMENT Right 08/16/2009   KNEE ARTHROPLASTY Left 2006   KNEE ARTHROPLASTY Right 12/10   x2   LAPAROSCOPIC APPENDECTOMY N/A 08/12/2018   Procedure: APPENDECTOMY LAPAROSCOPIC;  Surgeon: Olean Ree, MD;  Location: [facility] ORS;  Service: General;  Laterality: N/A;   TONSILLECTOMY      Social History   Socioeconomic History   Marital status: Married    Spouse name: Not on file   Number of children: Not on file   Years of education: Not on file   Highest education level: Not on file  Occupational History   Not on file  Tobacco Use   Smoking status: Former Smoker    Packs/day: 1.00    Years: 10.00    Pack years: 10.00    Types: Cigarettes    Quit date: 12/31/1970    Years since quitting: 49.2   Smokeless tobacco: Never Used  Vaping Use   Vaping Use: Never used  Substance and Sexual Activity   Alcohol use: Yes    Alcohol/week: 0.0 standard drinks    Comment: 2 DRINKS PER DAY, none this week (7/26)   Drug use: Never   Sexual activity: Not Currently  Other Topics Concern   Not  on file  Social History Narrative   Not on file   Social Determinants of Health   Financial Resource Strain:    Difficulty of Paying Living Expenses:   Food Insecurity:    Worried About Charity fundraiser in the Last Year:    Arboriculturist in the Last Year:   Transportation Needs:    Film/video editor (Medical):    Lack of Transportation (Non-Medical):   Physical Activity:    Days of Exercise per Week:    Minutes of Exercise per Session:   Stress:    Feeling of Stress :   Social Connections:    Frequency of Communication  with Friends and Family:    Frequency of Social Gatherings with Friends and Family:    Attends Religious Services:    Active Member of Clubs or Organizations:    Attends Music therapist:    Marital Status:   Intimate Partner Violence:    Fear of Current or Ex-Partner:    Emotionally Abused:    Physically Abused:    Sexually Abused:     Family History  Problem Relation Age of Onset   Cancer Mother    Lung cancer Mother    Heart attack Father     ROS: no fevers or chills, productive cough, hemoptysis, dysphasia, odynophagia, melena, hematochezia, dysuria, hematuria, rash, seizure activity, orthopnea, PND, pedal edema, claudication. Remaining systems are negative.  Physical Exam: Well-developed well-nourished in no acute distress.  Skin is warm and dry.  HEENT is normal.  Neck is supple.  Chest is clear to auscultation with normal expansion.  Cardiovascular exam is regular rate and rhythm.  Abdominal exam nontender or distended. No masses palpated. Extremities show no edema. neuro grossly intact  ECG-sinus bradycardia with first-degree AV block, heart rate 49, nonspecific ST changes.  Prolonged QT interval.  Personally reviewed  A/P  1 nonischemic cardiomyopathy-this is felt possibly secondary to ectopy in the past.  Continue hydralazine/nitrates (patient with history of allergy to ACE inhibitors and  ARB use) and continue beta-blocker.  We will repeat echocardiogram.  2 history of frequent PVCs-this is felt possibly the cause of his cardiomyopathy.  However his recent chest CT showed inflammatory changes and amiodarone has been discontinued.  Plan is for repeat CT in September.  If changes improved following course of antibiotics will likely resume amiodarone at previous dose.  Note his sed rate was normal.  3 chronic systolic congestive heart failure-patient is not volume overloaded on examination.  Continue diuretic at present dose.  4 pulmonary nodules-we will plan repeat noncontrast chest CT September 2021.  5 hypertension-blood pressure controlled.  Continue present medications.  6 hyperlipidemia-continue statin.  7 dilated thoracic aorta-plan follow-up CT June 2022.  8 chronic stage III kidney disease-monitored by primary care.  Kirk Ruths, MD

## 2020-03-19 ENCOUNTER — Ambulatory Visit: Payer: Medicare Other | Admitting: Pain Medicine

## 2020-03-20 ENCOUNTER — Encounter: Payer: Self-pay | Admitting: Cardiology

## 2020-03-20 ENCOUNTER — Ambulatory Visit: Payer: Medicare Other | Admitting: Cardiology

## 2020-03-20 ENCOUNTER — Other Ambulatory Visit: Payer: Self-pay

## 2020-03-20 VITALS — BP 122/79 | HR 49 | Temp 97.0°F | Ht 68.5 in | Wt 187.6 lb

## 2020-03-20 DIAGNOSIS — I712 Thoracic aortic aneurysm, without rupture, unspecified: Secondary | ICD-10-CM

## 2020-03-20 DIAGNOSIS — I428 Other cardiomyopathies: Secondary | ICD-10-CM

## 2020-03-20 DIAGNOSIS — E7800 Pure hypercholesterolemia, unspecified: Secondary | ICD-10-CM

## 2020-03-20 DIAGNOSIS — I10 Essential (primary) hypertension: Secondary | ICD-10-CM

## 2020-03-20 NOTE — Patient Instructions (Signed)
Medication Instructions:  NO CHANGE *If you need a refill on your cardiac medications before your next appointment, please call your pharmacy*   Lab Work: If you have labs (blood work) drawn today and your tests are completely normal, you will receive your results only by:  MyChart Message (if you have MyChart) OR  A paper copy in the mail If you have any lab test that is abnormal or we need to change your treatment, we will call you to review the results.   Testing/Procedures:  Your physician has requested that you have an echocardiogram. Echocardiography is a painless test that uses sound waves to create images of your heart. It provides your doctor with information about the size and shape of your heart and how well your hearts chambers and valves are working. This procedure takes approximately one hour. There are no restrictions for this procedure.[address]  CT OF CHEST WO CONTRAST AT [address]= [facility] IN [place]   Follow-Up: At [facility], you and your health needs are our priority.  As part of our continuing mission to provide you with exceptional heart care, we have created designated Provider Care Teams.  These Care Teams include your primary Cardiologist (physician) and Advanced Practice Providers (APPs -  Physician Assistants and Nurse Practitioners) who all work together to provide you with the care you need, when you need it.  We recommend signing up for the patient portal called "MyChart".  Sign up information is provided on this After Visit Summary.  MyChart is used to connect with patients for Virtual Visits (Telemedicine).  Patients are able to view lab/test results, encounter notes, upcoming appointments, etc.  Non-urgent messages can be sent to your provider as well.   To learn more about what you can do with MyChart, go to [email or web address].    Your next appointment:   6 month(s)  The format for your next  appointment:   In Person  Provider:   You may see Olga Millers, MD or one of the following Advanced Practice Providers on your designated Care Team:    Corine Shelter, PA-C  [place], [place]  Edd Fabian, [place]

## 2020-03-25 ENCOUNTER — Other Ambulatory Visit: Payer: Self-pay | Admitting: Cardiology

## 2020-03-25 ENCOUNTER — Other Ambulatory Visit: Payer: Self-pay | Admitting: Pain Medicine

## 2020-03-25 DIAGNOSIS — M792 Neuralgia and neuritis, unspecified: Secondary | ICD-10-CM

## 2020-03-28 ENCOUNTER — Other Ambulatory Visit: Payer: Self-pay

## 2020-03-28 ENCOUNTER — Ambulatory Visit (HOSPITAL_COMMUNITY): Payer: Medicare Other | Attending: Cardiology

## 2020-03-28 DIAGNOSIS — I428 Other cardiomyopathies: Secondary | ICD-10-CM

## 2020-03-28 LAB — ECHOCARDIOGRAM COMPLETE
Area-P 1/2: 3.48 cm2
P 1/2 time: 499 msec
S' Lateral: 5.25 cm

## 2020-05-17 ENCOUNTER — Other Ambulatory Visit: Payer: Self-pay

## 2020-05-17 ENCOUNTER — Ambulatory Visit
Admission: RE | Admit: 2020-05-17 | Discharge: 2020-05-17 | Disposition: A | Discharge status: Home / Self Care | Payer: Medicare Other | Source: Ambulatory Visit | Attending: Cardiology | Admitting: Cardiology

## 2020-05-17 DIAGNOSIS — R911 Solitary pulmonary nodule: Secondary | ICD-10-CM

## 2020-05-18 ENCOUNTER — Telehealth: Payer: Self-pay | Admitting: Cardiology

## 2020-05-18 ENCOUNTER — Other Ambulatory Visit: Payer: Self-pay | Admitting: *Deleted

## 2020-05-18 DIAGNOSIS — R9389 Abnormal findings on diagnostic imaging of other specified body structures: Secondary | ICD-10-CM

## 2020-05-18 DIAGNOSIS — E785 Hyperlipidemia, unspecified: Secondary | ICD-10-CM

## 2020-05-18 MED ORDER — ATORVASTATIN CALCIUM 20 MG PO TABS: 20.0000 mg | ORAL_TABLET | Freq: Every day | ORAL | 2 refills | Status: DC

## 2020-05-18 NOTE — Telephone Encounter (Signed)
Shanda Bumps with [facility] is requesting to have patient's atorvastatin (LIPITOR) decreased from 40 mg to 20 mg tablets. She states patient has been cutting the tablets in half. However, the tablets are so small that the patient is able to split them precisely and he is not getting the correct dosage of the medication. Please advise.

## 2020-05-18 NOTE — Telephone Encounter (Signed)
Changed RX to a 20 mg tablet instead of previous 40 mg tablet.  Previous instructions were 40 mg tablet- 1/2 tablet daily.   I will notify Dr.Crenshaw of the change to medication list.   Thank you!

## 2020-05-24 ENCOUNTER — Ambulatory Visit
Admission: EM | Admit: 2020-05-24 | Discharge: 2020-05-24 | Disposition: A | Discharge status: Home / Self Care | Payer: Medicare Other | Attending: Family Medicine | Admitting: Family Medicine

## 2020-05-24 ENCOUNTER — Other Ambulatory Visit: Payer: Self-pay

## 2020-05-24 DIAGNOSIS — Z23 Encounter for immunization: Secondary | ICD-10-CM

## 2020-05-24 DIAGNOSIS — M79642 Pain in left hand: Secondary | ICD-10-CM | POA: Diagnosis not present

## 2020-05-24 DIAGNOSIS — L089 Local infection of the skin and subcutaneous tissue, unspecified: Secondary | ICD-10-CM

## 2020-05-24 MED ORDER — TETANUS-DIPHTH-ACELL PERTUSSIS 5-2.5-18.5 LF-MCG/0.5 IM SUSP
0.5000 mL | Freq: Once | INTRAMUSCULAR | Status: AC
  Administered 2020-05-24: 0.5 mL via INTRAMUSCULAR

## 2020-05-24 NOTE — ED Triage Notes (Signed)
Patient states that he was cutting down a tree and the bark of the tree came down and peeled back the skin on his left hand. Patient states that when this happened he threw his hand backwards and suffered a puncture wound to his right arm by a screw. Patient reports that this all occurred Tuesday evening.

## 2020-05-24 NOTE — Discharge Instructions (Addendum)
We updated your tetanus vaccine in the office today  Take the Keflex twice a day for 7 days  Continue to use the antibiotic ointment on the area  Follow up with this office or with primary care for increased swelling, redness, tenderness, warmth, drainage from the area.  Follow up in the ER for red streaking up your arm, high fever, trouble swallowing, trouble breathing, other concerning symptoms.

## 2020-05-24 NOTE — ED Provider Notes (Signed)
Renaldo Fiddler    CSN: [id number] Arrival date & time: 05/24/20  6073      History   Chief Complaint Chief Complaint  Patient presents with  . Hand Injury    HPI [name] is a 74 y.o. male.   Reports that while he was cutting down a tree yesterday some of the bar came down peeled back the skin of the dorsal surface of the left hand.  Patient reports that when this happened he lifted his hands up and back and then he had a puncture wound to his right forearm with this screw.  Patient reports that this happened 2 days ago.  Has been using antibiotic ointment to the left hand.  Cannot recall last tetanus vaccine.  Denies headache, cough, shortness of breath, nausea, vomiting, diarrhea, rash, fever, other symptoms.  ROS per HPI  The history is provided by the patient.    Past Medical History:  Diagnosis Date  . Abnormal chest CT   . Anaphylactic reaction   . Anxiety   . Asthma   . Benign hypertension   . Cardiomyopathy   . Cardiomyopathy, idiopathic (HCC)   . CHF (congestive heart failure) (HCC)   . Chronic renal insufficiency    With creatinine 1.4  . Drug-induced urticaria   . FH: thoracic aortic aneurysm   . GERD (gastroesophageal reflux disease)   . History of colon polyps   . Hives    Long Hx of hives  . Hyperlipidemia   . Hyperlipidemia   . IMPLANTATION OF DEFIBRILLATOR, HX OF 02/15/2009   Qualifier: Diagnosis of  By: Jens Som, MD, Lyn Hollingshead   . NSVT (nonsustained ventricular tachycardia) (HCC)   . Osteoarthritis   . Osteoarthrosis, unspecified whether generalized or localized, lower leg   . Osteoporosis   . Oxygen deficiency   . Psoriasis   . PVC (premature ventricular contraction)   . 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 History of colonic polyps 09/24/2018  . Acute appendicitis 08/12/2018  . Thoracic Facet hypertrophy (Bilateral) 01/20/2018  . Thoracic facet  syndrome (Bilateral) 01/20/2018  . 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 Thoracic radiculitis (Left) 01/20/2018  . Chronic upper back pain (Left) 01/20/2018  . Aortic atherosclerosis (HCC) 01/20/2018  . Lumbar facet arthropathy 01/20/2018  . Spondylosis without myelopathy or radiculopathy, thoracic region 01/20/2018  . Spondylosis without myelopathy or radiculopathy, lumbosacral region 01/20/2018  . History of rib fracture 01/20/2018  . Diverticulosis, sigmoid 01/20/2018  . Cholelithiasis 01/20/2018  . Thoracic ascending aortic aneurysm (HCC) (4.2 cm) 01/20/2018  . Hx of total knee replacement (Right) 01/20/2018  . Chronic upper quadrant pain (LUQ) (Left) 01/20/2018  . Neurogenic pain 01/20/2018  . Chronic chest wall pain (Primary Area of Pain) (Left) 01/06/2018  . Chronic thoracic back pain (Secondary Area of Pain) (Left) 01/06/2018  . Positive Murphy's Sign 01/05/2018  . Chronic pain syndrome 01/05/2018  . Pharmacologic therapy 01/05/2018  . Disorder of skeletal system 01/05/2018  . Problems influencing health status 01/05/2018  . B12 deficiency 10/06/2016  . Medicare annual wellness visit, initial 10/06/2016  . Abdominal pain (Left) 04/20/2015  . CKD (chronic kidney disease) stage 3, GFR 30-59 ml/min (HCC) 09/18/2014  . Asthma 01/05/2014  . Bruit 08/29/2013  . 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 Chronic systolic heart failure (HCC) 09/09/2011  . Benign essential hypertension 02/15/2009  . Cardiomyopathy, nonischemic (HCC)  02/15/2009  . LUNG NODULE 02/15/2009  . DIZZINESS 02/15/2009  . Nonspecific (abnormal) findings on radiological and other examination of body structure 02/15/2009  . Nonspecific abnormal findings on radiological and other examination of skull and head 02/15/2009  . Combined hyperlipidemia 10/27/2008  . RHEUMATIC FEVER 10/27/2008  . Asymptomatic PVCs 10/27/2008  . Other osteoporosis 10/27/2008    Past  Surgical History:  Procedure Laterality Date  . APPENDECTOMY    . BIV ICD GENERTAOR CHANGE OUT N/A 10/08/2012   Procedure: BIV ICD GENERTAOR CHANGE OUT;  Surgeon: Marinus Maw, MD;  Location: [facility] CATH LAB;  Service: Cardiovascular;  Laterality: N/A;  . CARDIAC CATHETERIZATION    . CARDIAC DEFIBRILLATOR REMOVAL  2014  . CATARACT EXTRACTION    . CATARACT EXTRACTION W/PHACO Left 03/12/2016   Procedure: CATARACT EXTRACTION PHACO AND INTRAOCULAR LENS PLACEMENT (IOC);  Surgeon: Sallee Lange, MD;  Location: [facility] ORS;  Service: Ophthalmology;  Laterality: Left;  [place] 01:32 AP% 25.7 CDE 41.33 fluid pack lot # [id number]  . COLONOSCOPY WITH PROPOFOL N/A 04/26/2019   Procedure: COLONOSCOPY WITH PROPOFOL;  Surgeon: Christena Deem, MD;  Location: [facility] ENDOSCOPY;  Service: Endoscopy;  Laterality: N/A;  . ESOPHAGOGASTRODUODENOSCOPY    . EYE SURGERY    . Implantation of dual-chamber ICD.     St[employer] Jude DDD-ICD 1/07  . JOINT REPLACEMENT Right 08/16/2009  . KNEE ARTHROPLASTY Left 2006  . KNEE ARTHROPLASTY Right 12/10   x2  . LAPAROSCOPIC APPENDECTOMY N/A 08/12/2018   Procedure: APPENDECTOMY LAPAROSCOPIC;  Surgeon: Henrene Dodge, MD;  Location: [facility] ORS;  Service: General;  Laterality: N/A;  . TONSILLECTOMY         Home Medications    Prior to Admission medications   Medication Sig Start Date End Date Taking? Authorizing Provider  atorvastatin (LIPITOR) 20 MG tablet Take 1 tablet (20 mg total) by mouth daily. 05/18/20  Yes Lewayne Bunting, MD  carvedilol (COREG) 25 MG tablet TAKE 2 TABLETS (50 MG TOTAL) BY MOUTH 2 (TWO) TIMES DAILY WITH A MEAL. 03/26/20  Yes Lewayne Bunting, MD  DENTA 5000 PLUS 1.1 % CREA dental cream See admin instructions. 07/18/18  Yes [provider]  diphenhydrAMINE (BENADRYL) 25 mg capsule Take 25 mg by mouth every 6 (six) hours as needed.   Yes [provider]  EPINEPHrine 0.3 mg/0.3 mL IJ SOAJ injection Inject 0.3 mg into the muscle as needed for  anaphylaxis.   Yes [provider]  famotidine (PEPCID) 40 MG tablet Take 40 mg by mouth 2 (two) times daily. 11/02/18  Yes [provider]  furosemide (LASIX) 20 MG tablet Take 1 tablet (20 mg total) by mouth daily. 09/13/19  Yes Lewayne Bunting, MD  gabapentin (NEURONTIN) 100 MG capsule Take 1 capsule (100 mg total) by mouth 2 (two) times daily AND 3 capsules (300 mg total) at bedtime. Max: 5/day. Patient taking differently: Take 1 capsule (100 mg total) by mouth 3 (two) times daily . Max: 3/day. 03/21/19 05/24/20 Yes Delano Metz, MD  hydrALAZINE (APRESOLINE) 50 MG tablet TAKE 1 & 1/2 TABLET BY MOUTH 3 TIMES DAILY 11/08/19  Yes Lewayne Bunting, MD  isosorbide mononitrate (IMDUR) 30 MG 24 hr tablet Take 1 tablet (30 mg total) by mouth daily. 03/21/13 05/24/20 Yes Lewayne Bunting, MD  levocetirizine (XYZAL) 5 MG tablet Take 5 mg by mouth every evening.   Yes [provider]  montelukast (SINGULAIR) 10 MG tablet Take 10 mg by mouth at bedtime.   Yes [provider]  potassium chloride (K-DUR) 10 MEQ tablet Take 1 tablet (10 mEq total) by mouth daily. 11/29/18  Yes Lewayne Bunting, MD  predniSONE (DELTASONE) 20 MG tablet Take 20 mg by mouth daily with breakfast.    [provider]  silver sulfADIAZINE (SILVADENE) 1 % cream Apply 1 application topically daily. 01/16/20   Mickie Bail, NP    Family History Family History  Problem Relation Age of Onset  . Cancer Mother   . Lung cancer Mother   . Heart attack Father     Social History Social History   Tobacco Use  . Smoking status: Former Smoker    Packs/day: 1.00    Years: 10.00    Pack years: 10.00    Types: Cigarettes    Quit date: 12/31/1970    Years since quitting: 49.4  . Smokeless tobacco: Never Used  Vaping Use  . Vaping Use: Never used  Substance Use Topics  . Alcohol use: Yes    Alcohol/week: 0.0 standard drinks    Comment: 2 DRINKS PER DAY, none this week (7/26)  . Drug  use: Never     Allergies   Beef allergy, Meat [alpha-gal], Pork-derived products, Ace inhibitors, Aspirin, Indocin [indomethacin], Mobic [meloxicam], and Nsaids   Review of Systems Review of Systems   Physical Exam Triage Vital Signs  ED Triage Vitals  Enc Vitals Group     BP --      Pulse --      Resp --      Temp --      Temp src --      SpO2 --      Weight 05/24/20 0901 173 lb (78.5 kg)     Height 05/24/20 0901 5\' 8"  (1.727 m)     Head Circumference --      Peak Flow --      Pain Score 05/24/20 0900 4     Pain Loc --      Pain Edu? --      Excl. in GC? --    No data found.  Updated Vital Signs BP 126/71 (BP Location: Left Arm)   Pulse (!) 57   Temp 98.1 F (36.7 C) (Oral)   Resp 18   Ht 5\' 8"  (1.727 m)   Wt 173 lb (78.5 kg)   SpO2 98%   BMI 26.30 kg/m   Physical Exam Vitals and nursing note reviewed.  Constitutional:      General: He is not in acute distress.    Appearance: Normal appearance. He is well-developed. He is not ill-appearing.  HENT:     Head: Normocephalic and atraumatic.  Eyes:     Conjunctiva/sclera: Conjunctivae normal.  Cardiovascular:     Rate and Rhythm: Normal rate and regular rhythm.     Heart sounds: Normal heart sounds. No murmur heard.   Pulmonary:     Effort: Pulmonary effort is normal. No respiratory distress.     Breath sounds: Normal breath sounds.  Abdominal:     Palpations: Abdomen is soft.     Tenderness: There is no abdominal tenderness.  Musculoskeletal:        General: Normal range of motion.     Cervical back: Neck supple.  Skin:    General: Skin is warm and dry.     Findings: Lesion (see photo) present.  Neurological:     General: No focal deficit present.     Mental Status: He is alert and oriented to person,  place, and time.  Psychiatric:        Mood and Affect: Mood normal.        Behavior: Behavior normal.        Thought Content: Thought content normal.        UC Treatments / Results   Labs (all labs ordered are listed, but only abnormal results are displayed) Labs Reviewed - No data to display  EKG   Radiology No results found.  Procedures Procedures (including critical care time)  Medications Ordered in UC Medications  Tdap (BOOSTRIX) injection 0.5 mL (0.5 mLs Intramuscular Given 05/24/20 0938)    Initial Impression / Assessment and Plan / UC Course  I have reviewed the triage vital signs and the nursing notes.  Pertinent labs & imaging results that were available during my care of the patient were reviewed by me and considered in my medical decision making (see chart for details).     Left hand pain Wound with infection  Presents today for evaluation of his left hand after he sustained a wound 2 days ago while cutting down a tree.  Also sustained puncture wound to right forearm.  Puncture wound is nonerythematous, not draining Tdap in office today Wound on the left hand soaked and cleansed with Hibiclens Irrigated, multiple black specks of foreign bodies removed Bacitracin and nonadherent dressing applied Given swelling, tenderness, heat, surrounding erythema of the area will treat for infection today. Keflex 500 mg twice a day for 7 days Follow-up with this office for symptoms of infection including drainage from the area, increased redness, swelling, heat, other concerning symptoms Follow-up with the ER for high fever, red streaking up the arm, trouble swallowing, trouble breathing, other concerning symptoms  Final Clinical Impressions(s) / UC Diagnoses   Final diagnoses:  Left hand pain  Wound infection     Discharge Instructions     We updated your tetanus vaccine in the office today  Take the Keflex twice a day for 7 days  Continue to use the antibiotic ointment on the area  Follow up with this office or with primary care for increased swelling, redness, tenderness, warmth, drainage from the area.  Follow up in the ER for red  streaking up your arm, high fever, trouble swallowing, trouble breathing, other concerning symptoms.     ED Prescriptions    None     PDMP not reviewed this encounter.   Moshe Cipro, NP 05/24/20 1217

## 2020-08-07 NOTE — Telephone Encounter (Signed)
Spoke with patient. Patient reports symptoms started 3 days ago after taking his medications. He became SOB, dizzy and just felt awful. He reports his HR has been low and the highest it has been is 62 in the last few months.   He does report last night symptoms started 1 hour after he took his medications. He put oxygen on for a little while and he felt better after resting.   This morning he held his medications and he feels good. BP 139/86, HR 53. His omron states his HR is irregular but he does not feel that it is. No symptoms at this time.   He wants to know if it is time to adjust his medication. He has lost weight in the last 2 years going from 235 to 175lbs. Will route to MD.

## 2020-08-09 MED ORDER — CARVEDILOL 25 MG PO TABS: 12.5000 mg | ORAL_TABLET | Freq: Two times a day (BID) | ORAL | 3 refills | Status: DC

## 2020-08-09 MED ORDER — HYDRALAZINE HCL 50 MG PO TABS: 50.0000 mg | ORAL_TABLET | Freq: Three times a day (TID) | ORAL | 5 refills | Status: DC

## 2020-08-09 NOTE — Telephone Encounter (Signed)
  Lewayne Bunting, MD  Freddi Starr, RN 21 hours ago (4:27 PM)   Decrease coreg to 12.5 BID and hydralazine to 50 TID and follow pulse and BP  Olga Millers    Spoke with patient regarding medication instructions from [name]. Advised patient to decrease Coreg (Carvedilol) to 12.5mg  (1/2) Tablet twice daily and Hydralazine to 50mg  (1 Tablet) TID. Advised patient to continue monitoring blood pressure and heart rate and to write those numbers down and send some readings back in 1 week on the decreased medications. Patient verbalized understanding of all instructions. Patient asked for instructions to be sent through MyChart as well.   Medications updated in chart.   Mychart message with instructions sent to patient.   Patient verbalized understanding of all instructions.

## 2020-08-30 ENCOUNTER — Encounter: Payer: Self-pay | Admitting: *Deleted

## 2020-08-31 ENCOUNTER — Other Ambulatory Visit: Payer: Self-pay

## 2020-08-31 ENCOUNTER — Ambulatory Visit
Admission: RE | Admit: 2020-08-31 | Discharge: 2020-08-31 | Disposition: A | Discharge status: Home / Self Care | Payer: Medicare Other | Source: Ambulatory Visit | Attending: Cardiology | Admitting: Cardiology

## 2020-08-31 DIAGNOSIS — R9389 Abnormal findings on diagnostic imaging of other specified body structures: Secondary | ICD-10-CM

## 2020-09-11 NOTE — Progress Notes (Signed)
HPI: FU nonischemic cardiomyopathy. Cardiac cath at [facility] in 2004 showed normal coronaries and EF 24; endocardial biopsy unrevealing. Note, his cardiomyopathy is felt possibly secondary to ventricular ectopy in the past and he was on amiodarone for that. Previous holter monitor in Dec 2012 showed frequent PVCs and nonsustained ventricular tachycardia. He also has had a previous ICD (generator removed in February of 2014 as his device had reached ERI and his LV function had improved). Abd ultrasound 1/15 showed no aneurysm. Echocardiogram August 2021 showed ejection fraction 35 to 40%, mild left ventricular hypertrophy, with mild left ventricular enlargement, grade 1 diastolic dysfunction, moderate left atrial enlargement, mild to moderate tricuspid regurgitation and mild to moderate aortic insufficiency.  Ascending aorta mildly dilated at 42 mm.   CTA September 2021 showed 41 mm ascending aortic aneurysm.  Since last seen,, chest pain, palpitations or syncope.  Current Outpatient Medications  Medication Sig Dispense Refill  . atorvastatin (LIPITOR) 10 MG tablet Take 10 mg by mouth daily.    . carvedilol (COREG) 25 MG tablet Take 0.5 tablets (12.5 mg total) by mouth 2 (two) times daily. 60 tablet 3  . DENTA 5000 PLUS 1.1 % CREA dental cream See admin instructions.    . diphenhydrAMINE (BENADRYL) 25 mg capsule Take 25 mg by mouth every 6 (six) hours as needed.    Marland Kitchen EPINEPHrine 0.3 mg/0.3 mL IJ SOAJ injection Inject 0.3 mg into the muscle as needed for anaphylaxis.    . famotidine (PEPCID) 40 MG tablet Take 40 mg by mouth 2 (two) times daily.    . furosemide (LASIX) 20 MG tablet Take 1 tablet (20 mg total) by mouth daily. 90 tablet 3  . gabapentin (NEURONTIN) 300 MG capsule Take 300 mg by mouth at bedtime.    . hydrALAZINE (APRESOLINE) 50 MG tablet Take 1 tablet (50 mg total) by mouth 3 (three) times daily. Take 1 tablet 50mg   times daily 135 tablet 5  . isosorbide mononitrate (IMDUR) 30 MG 24 hr  tablet Take 1 tablet (30 mg total) by mouth daily.    . potassium chloride (K-DUR) 10 MEQ tablet Take 1 tablet (10 mEq total) by mouth daily. 90 tablet 3  . predniSONE (DELTASONE) 20 MG tablet Take 20 mg by mouth daily with breakfast.     No current facility-administered medications for this visit.     Past Medical History:  Diagnosis Date  . Abnormal chest CT   . Anaphylactic reaction   . Anxiety   . Asthma   . Benign hypertension   . Cardiomyopathy   . Cardiomyopathy, idiopathic (HCC)   . CHF (congestive heart failure) (HCC)   . Chronic renal insufficiency    With creatinine 1.4  . Drug-induced urticaria   . FH: thoracic aortic aneurysm   . GERD (gastroesophageal reflux disease)   . History of colon polyps   . Hives    Long Hx of hives  . Hyperlipidemia   . Hyperlipidemia   . IMPLANTATION OF DEFIBRILLATOR, HX OF 02/15/2009   Qualifier: Diagnosis of  By: 04/18/2009, MD, Jens Som   . NSVT (nonsustained ventricular tachycardia) (HCC)   . Osteoarthritis   . Osteoarthrosis, unspecified whether generalized or localized, lower leg   . Osteoporosis   . Oxygen deficiency   . Psoriasis   . PVC (premature ventricular contraction)   . 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 APPENDECTOMY    .  BIV ICD GENERTAOR CHANGE OUT N/A 10/08/2012   Procedure: BIV ICD GENERTAOR CHANGE OUT;  Surgeon: Marinus Maw, MD;  Location: [facility] CATH LAB;  Service: Cardiovascular;  Laterality: N/A;  . CARDIAC CATHETERIZATION    . CARDIAC DEFIBRILLATOR REMOVAL  2014  . CATARACT EXTRACTION    . CATARACT EXTRACTION W/PHACO Left 03/12/2016   Procedure: CATARACT EXTRACTION PHACO AND INTRAOCULAR LENS PLACEMENT (IOC);  Surgeon: Sallee Lange, MD;  Location: [facility] ORS;  Service: Ophthalmology;  Laterality: Left;  [place] 01:32 AP% 25.7 CDE 41.33 fluid pack lot # [id number]  . COLONOSCOPY WITH PROPOFOL N/A 04/26/2019   Procedure: COLONOSCOPY WITH  PROPOFOL;  Surgeon: Christena Deem, MD;  Location: [facility] ENDOSCOPY;  Service: Endoscopy;  Laterality: N/A;  . ESOPHAGOGASTRODUODENOSCOPY    . EYE SURGERY    . Implantation of dual-chamber ICD.     St. Jude DDD-ICD 1/07  . JOINT REPLACEMENT Right 08/16/2009  . KNEE ARTHROPLASTY Left 2006  . KNEE ARTHROPLASTY Right 12/10   x2  . LAPAROSCOPIC APPENDECTOMY N/A 08/12/2018   Procedure: APPENDECTOMY LAPAROSCOPIC;  Surgeon: Henrene Dodge, MD;  Location: [facility] ORS;  Service: General;  Laterality: N/A;  . TONSILLECTOMY      Social History   Socioeconomic History  . Marital status: Married    Spouse name: Not on file  . Number of children: Not on file  . Years of education: Not on file  . Highest education level: Not on file  Occupational History  . Not on file  Tobacco Use  . Smoking status: Former Smoker    Packs/day: 1.00    Years: 10.00    Pack years: 10.00    Types: Cigarettes    Quit date: 12/31/1970    Years since quitting: 49.7  . Smokeless tobacco: Never Used  Vaping Use  . Vaping Use: Never used  Substance and Sexual Activity  . Alcohol use: Yes    Alcohol/week: 0.0 standard drinks    Comment: 2 DRINKS PER DAY, none this week (7/26)  . Drug use: Never  . Sexual activity: Not Currently  Other Topics Concern  . Not on file  Social History Narrative  . Not on file   Social Determinants of Health   Financial Resource Strain: Not on file  Food Insecurity: Not on file  Transportation Needs: Not on file  Physical Activity: Not on file  Stress: Not on file  Social Connections: Not on file  Intimate Partner Violence: Not on file    Family History  Problem Relation Age of Onset  . Cancer Mother   . Lung cancer Mother   . Heart attack Father     ROS: no fevers or chills, productive cough, hemoptysis, dysphasia, odynophagia, melena, hematochezia, dysuria, hematuria, rash, seizure activity, orthopnea, PND, pedal edema, claudication. Remaining systems are  negative.  Physical Exam: Well-developed well-nourished in no acute distress.  Skin is warm and dry.  HEENT is normal.  Neck is supple.  Chest is clear to auscultation with normal expansion.  Cardiovascular exam is regular rate and rhythm.  Abdominal exam nontender or distended. No masses palpated. Extremities show no edema. neuro grossly intact  ECG-sinus bradycardia with first-degree AV block, normal axis, no ST changes.  Personally reviewed  A/P  1 nonischemic cardiomyopathy-this is felt possibly secondary to ectopy.  Continue hydralazine/nitrates and beta-blocker.  Patient has an allergy to ACE inhibitor/ARB's.  2 frequent PVCs-amiodarone discontinued previously as CT suggested possible inflammatory changes.  However follow-up chest CT showed improvement following antibiotics and his  previous sed rate was normal.  Therefore unlikely secondary to amiodarone.  Amiodarone has been resumed at 200 mg daily.  Will repeat chest x-ray in 6 months.  We will also check liver functions and TSH at that time.  3 chronic systolic congestive heart failure-patient appears to be euvolemic.  Continue diuretics at present dose.   4 history of pulmonary nodule-no evidence on most recent CT scan.  5 dilated ascending aorta measuring 41 mm-plan follow-up CTA September 2022.  6 hypertension-patient's blood pressure is controlled today.  Continue present medications.  7 hyperlipidemia-continue statin.  8 chronic stage III kidney disease  Olga Millers, MD

## 2020-09-25 ENCOUNTER — Other Ambulatory Visit: Payer: Self-pay

## 2020-09-25 ENCOUNTER — Encounter: Payer: Self-pay | Admitting: Cardiology

## 2020-09-25 ENCOUNTER — Ambulatory Visit: Payer: Medicare Other | Admitting: Cardiology

## 2020-09-25 VITALS — BP 130/62 | HR 56 | Ht 68.0 in | Wt 193.6 lb

## 2020-09-25 DIAGNOSIS — I428 Other cardiomyopathies: Secondary | ICD-10-CM

## 2020-09-25 DIAGNOSIS — I712 Thoracic aortic aneurysm, without rupture, unspecified: Secondary | ICD-10-CM

## 2020-09-25 DIAGNOSIS — I10 Essential (primary) hypertension: Secondary | ICD-10-CM

## 2020-09-25 DIAGNOSIS — I5022 Chronic systolic (congestive) heart failure: Secondary | ICD-10-CM | POA: Diagnosis not present

## 2020-09-25 NOTE — Patient Instructions (Signed)

## 2020-10-03 ENCOUNTER — Other Ambulatory Visit: Payer: Self-pay | Admitting: Cardiology

## 2020-10-10 MED ORDER — AMIODARONE HCL 200 MG PO TABS: 200.0000 mg | ORAL_TABLET | Freq: Every day | ORAL | 2 refills | Status: DC

## 2020-11-28 ENCOUNTER — Ambulatory Visit
Admission: EM | Admit: 2020-11-28 | Discharge: 2020-11-28 | Disposition: A | Discharge status: Home / Self Care | Payer: Medicare Other | Attending: Emergency Medicine | Admitting: Emergency Medicine

## 2020-11-28 ENCOUNTER — Other Ambulatory Visit: Payer: Self-pay

## 2020-11-28 DIAGNOSIS — B349 Viral infection, unspecified: Secondary | ICD-10-CM | POA: Diagnosis not present

## 2020-11-28 DIAGNOSIS — Z1152 Encounter for screening for COVID-19: Secondary | ICD-10-CM | POA: Diagnosis not present

## 2020-11-28 MED ORDER — ACETAMINOPHEN 325 MG PO TABS
650.0000 mg | ORAL_TABLET | Freq: Once | ORAL | Status: AC
  Administered 2020-11-28: 650 mg via ORAL

## 2020-11-28 NOTE — ED Provider Notes (Signed)
[name]    CSN: [id number] Arrival date & time: 11/28/20  1438      History   Chief Complaint Chief Complaint  Patient presents with  . Headache  . Back Pain    HPI [name] is a 75 y.o. male.   Patient presents with fever, body aches, fatigue, headache, back pain since yesterday evening.  He denies rash, sore throat, cough, shortness of breath, vomiting, diarrhea, abdominal pain, dysuria, or other symptoms.  No treatments attempted at home.  His medical history includes hypertension, congestive heart failure, cardiomyopathy, aortic atherosclerosis, CKD, chronic left upper quadrant pain, neurogenic pain, chronic pain syndrome, chronic chest wall pain, chronic thoracic back pain, degenerative disc disease.  The history is provided by the patient and medical records.    Past Medical History:  Diagnosis Date  . Abnormal chest CT   . Anaphylactic reaction   . Anxiety   . Asthma   . Benign hypertension   . Cardiomyopathy   . Cardiomyopathy, idiopathic (HCC)   . CHF (congestive heart failure) (HCC)   . Chronic renal insufficiency    With creatinine 1.4  . Drug-induced urticaria   . FH: thoracic aortic aneurysm   . GERD (gastroesophageal reflux disease)   . History of colon polyps   . Hives    Long Hx of hives  . Hyperlipidemia   . Hyperlipidemia   . IMPLANTATION OF DEFIBRILLATOR, HX OF 02/15/2009   Qualifier: Diagnosis of  By: Jens Som, MD, Lyn Hollingshead   . NSVT (nonsustained ventricular tachycardia) (HCC)   . Osteoarthritis   . Osteoarthrosis, unspecified whether generalized or localized, lower leg   . Osteoporosis   . Oxygen deficiency   . Psoriasis   . PVC (premature ventricular contraction)   . 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 History of colonic polyps 09/24/2018  . Acute appendicitis 08/12/2018  . Thoracic Facet hypertrophy (Bilateral) 01/20/2018  . Thoracic  facet syndrome (Bilateral) 01/20/2018  . 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 Thoracic radiculitis (Left) 01/20/2018  . Chronic upper back pain (Left) 01/20/2018  . Aortic atherosclerosis (HCC) 01/20/2018  . Lumbar facet arthropathy 01/20/2018  . Spondylosis without myelopathy or radiculopathy, thoracic region 01/20/2018  . Spondylosis without myelopathy or radiculopathy, lumbosacral region 01/20/2018  . History of rib fracture 01/20/2018  . Diverticulosis, sigmoid 01/20/2018  . Cholelithiasis 01/20/2018  . Thoracic ascending aortic aneurysm (HCC) (4.2 cm) 01/20/2018  . Hx of total knee replacement (Right) 01/20/2018  . Chronic upper quadrant pain (LUQ) (Left) 01/20/2018  . Neurogenic pain 01/20/2018  . Chronic chest wall pain (Primary Area of Pain) (Left) 01/06/2018  . Chronic thoracic back pain (Secondary Area of Pain) (Left) 01/06/2018  . Positive Murphy's Sign 01/05/2018  . Chronic pain syndrome 01/05/2018  . Pharmacologic therapy 01/05/2018  . Disorder of skeletal system 01/05/2018  . Problems influencing health status 01/05/2018  . B12 deficiency 10/06/2016  . Medicare annual wellness visit, initial 10/06/2016  . Abdominal pain (Left) 04/20/2015  . CKD (chronic kidney disease) stage 3, GFR 30-59 ml/min (HCC) 09/18/2014  . Asthma 01/05/2014  . Bruit 08/29/2013  . 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 Chronic systolic heart failure (HCC) 09/09/2011  . Benign essential hypertension 02/15/2009  . Cardiomyopathy, nonischemic (HCC) 02/15/2009  . LUNG NODULE 02/15/2009  . DIZZINESS 02/15/2009  . Nonspecific (abnormal) findings on radiological and other examination of body  structure 02/15/2009  . Nonspecific abnormal findings on radiological and other examination of skull and head 02/15/2009  . Combined hyperlipidemia 10/27/2008  . RHEUMATIC FEVER 10/27/2008  . Asymptomatic PVCs 10/27/2008  . Other osteoporosis 10/27/2008     Past Surgical History:  Procedure Laterality Date  . APPENDECTOMY    . BIV ICD GENERTAOR CHANGE OUT N/A 10/08/2012   Procedure: BIV ICD GENERTAOR CHANGE OUT;  Surgeon: Marinus Maw, MD;  Location: [facility] CATH LAB;  Service: Cardiovascular;  Laterality: N/A;  . CARDIAC CATHETERIZATION    . CARDIAC DEFIBRILLATOR REMOVAL  2014  . CATARACT EXTRACTION    . CATARACT EXTRACTION W/PHACO Left 03/12/2016   Procedure: CATARACT EXTRACTION PHACO AND INTRAOCULAR LENS PLACEMENT (IOC);  Surgeon: Sallee Lange, MD;  Location: [facility] ORS;  Service: Ophthalmology;  Laterality: Left;  [place] 01:32 AP% 25.7 CDE 41.33 fluid pack lot # [id number]  . COLONOSCOPY WITH PROPOFOL N/A 04/26/2019   Procedure: COLONOSCOPY WITH PROPOFOL;  Surgeon: Christena Deem, MD;  Location: [facility] ENDOSCOPY;  Service: Endoscopy;  Laterality: N/A;  . ESOPHAGOGASTRODUODENOSCOPY    . EYE SURGERY    . Implantation of dual-chamber ICD.     St. Jude DDD-ICD 1/07  . JOINT REPLACEMENT Right 08/16/2009  . KNEE ARTHROPLASTY Left 2006  . KNEE ARTHROPLASTY Right 12/10   x2  . LAPAROSCOPIC APPENDECTOMY N/A 08/12/2018   Procedure: APPENDECTOMY LAPAROSCOPIC;  Surgeon: Henrene Dodge, MD;  Location: [facility] ORS;  Service: General;  Laterality: N/A;  . TONSILLECTOMY         Home Medications    Prior to Admission medications   Medication Sig Start Date End Date Taking? Authorizing Provider  amiodarone (PACERONE) 200 MG tablet Take 1 tablet (200 mg total) by mouth daily. 10/10/20   Lewayne Bunting, MD  atorvastatin (LIPITOR) 10 MG tablet Take 10 mg by mouth daily.    [provider]  atorvastatin (LIPITOR) 20 MG tablet Take 1 tablet by mouth daily. 11/16/20   [provider]  carvedilol (COREG) 25 MG tablet Take 0.5 tablets (12.5 mg total) by mouth 2 (two) times daily. 08/09/20   Lewayne Bunting, MD  DENTA 5000 PLUS 1.1 % CREA dental cream See admin instructions. 07/18/18   [provider]  diphenhydrAMINE (BENADRYL)  25 mg capsule Take 25 mg by mouth every 6 (six) hours as needed.    [provider]  EPINEPHrine 0.3 mg/0.3 mL IJ SOAJ injection Inject 0.3 mg into the muscle as needed for anaphylaxis.    [provider]  famotidine (PEPCID) 40 MG tablet Take 40 mg by mouth 2 (two) times daily. 11/02/18   [provider]  furosemide (LASIX) 20 MG tablet Take 1 tablet (20 mg total) by mouth daily. 09/13/19   Lewayne Bunting, MD  gabapentin (NEURONTIN) 300 MG capsule Take 300 mg by mouth at bedtime.    [provider]  hydrALAZINE (APRESOLINE) 50 MG tablet Take 1 tablet (50 mg total) by mouth 3 (three) times daily. Take 1 tablet 50mg   times daily 08/09/20   08/11/20, MD  isosorbide mononitrate (IMDUR) 30 MG 24 hr tablet Take 1 tablet (30 mg total) by mouth daily. 03/21/13 05/24/20  07/24/20, MD  potassium chloride (K-DUR) 10 MEQ tablet Take 1 tablet (10 mEq total) by mouth daily. 11/29/18   12/01/18, MD  predniSONE (DELTASONE) 20 MG tablet Take 20 mg by mouth daily with breakfast.    [provider]    Family History Family  History  Problem Relation Age of Onset  . Cancer Mother   . Lung cancer Mother   . Heart attack Father     Social History Social History   Tobacco Use  . Smoking status: Former Smoker    Packs/day: 1.00    Years: 10.00    Pack years: 10.00    Types: Cigarettes    Quit date: 12/31/1970    Years since quitting: 49.9  . Smokeless tobacco: Never Used  Vaping Use  . Vaping Use: Never used  Substance Use Topics  . Alcohol use: Yes    Alcohol/week: 0.0 standard drinks    Comment: 2 DRINKS PER DAY, none this week (7/26)  . Drug use: Never     Allergies   Beef allergy, Meat [alpha-gal], Pork-derived products, Ace inhibitors, Aspirin, Indocin [indomethacin], Mobic [meloxicam], and Nsaids   Review of Systems Review of Systems  Constitutional: Positive for fatigue and fever. Negative for chills.  HENT: Negative  for ear pain and sore throat.   Eyes: Negative for pain and visual disturbance.  Respiratory: Negative for cough and shortness of breath.   Cardiovascular: Negative for chest pain and palpitations.  Gastrointestinal: Negative for abdominal pain and vomiting.  Genitourinary: Negative for dysuria and hematuria.  Musculoskeletal: Negative for arthralgias and back pain.       Body aches  Skin: Negative for color change and rash.  Neurological: Positive for headaches. Negative for seizures and syncope.  All other systems reviewed and are negative.    Physical Exam Triage Vital Signs ED Triage Vitals  Enc Vitals Group     BP 11/28/20 1453 (!) 148/71     Pulse Rate 11/28/20 1453 66     Resp 11/28/20 1453 18     Temp 11/28/20 1453 (!) 101 F (38.3 C)     Temp Source 11/28/20 1453 Oral     SpO2 11/28/20 1453 94 %     Weight 11/28/20 1451 178 lb (80.7 kg)     Height --      Head Circumference --      Peak Flow --      Pain Score 11/28/20 1451 0     Pain Loc --      Pain Edu? --      Excl. in GC? --    No data found.  Updated Vital Signs BP (!) 148/71 (BP Location: Left Arm)   Pulse 66   Temp (!) 101 F (38.3 C) (Oral)   Resp 18   Wt 178 lb (80.7 kg)   SpO2 94%   BMI 27.06 kg/m   Visual Acuity Right Eye Distance:   Left Eye Distance:   Bilateral Distance:    Right Eye Near:   Left Eye Near:    Bilateral Near:     Physical Exam Vitals and nursing note reviewed.  Constitutional:      General: He is not in acute distress.    Appearance: He is well-developed.  HENT:     Head: Normocephalic and atraumatic.     Right Ear: Tympanic membrane normal.     Left Ear: Tympanic membrane normal.     Nose: Nose normal.     Mouth/Throat:     Mouth: Mucous membranes are moist.     Pharynx: Oropharynx is clear.  Eyes:     Conjunctiva/sclera: Conjunctivae normal.  Cardiovascular:     Rate and Rhythm: Normal rate and regular rhythm.     Heart sounds: Normal heart sounds.  Pulmonary:     Effort: Pulmonary effort is normal. No respiratory distress.     Breath sounds: Normal breath sounds.  Abdominal:     Palpations: Abdomen is soft.     Tenderness: There is no abdominal tenderness.  Musculoskeletal:     Cervical back: Neck supple.  Skin:    General: Skin is warm and dry.     Findings: No rash.  Neurological:     General: No focal deficit present.     Mental Status: He is alert and oriented to person, place, and time.     Gait: Gait normal.  Psychiatric:        Mood and Affect: Mood normal.        Behavior: Behavior normal.      UC Treatments / Results  Labs (all labs ordered are listed, but only abnormal results are displayed) Labs Reviewed  NOVEL CORONAVIRUS, NAA  COVID-19, FLU A+B NAA    EKG   Radiology No results found.  Procedures Procedures (including critical care time)  Medications Ordered in UC Medications  acetaminophen (TYLENOL) tablet 650 mg (650 mg Oral Given 11/28/20 1506)    Initial Impression / Assessment and Plan / UC Course  I have reviewed the triage vital signs and the nursing notes.  Pertinent labs & imaging results that were available during my care of the patient were reviewed by me and considered in my medical decision making (see chart for details).   Viral illness.  Influenza and COVID pending.  Instructed patient to self quarantine until the test results are back.  Discussed symptomatic treatment including Tylenol, rest, hydration.  Instructed patient to follow up with PCP if his symptoms are not improving.  Patient agrees to plan of care.    Final Clinical Impressions(s) / UC Diagnoses   Final diagnoses:  Encounter for screening for COVID-19  Viral illness     Discharge Instructions     Your COVID and Influenza tests are pending.  You should self quarantine until the test results are back.    Take Tylenol as needed for fever or discomfort.  Rest and keep yourself hydrated.    Follow-up with  your primary care provider if your symptoms are not improving.        ED Prescriptions    None     PDMP not reviewed this encounter.   Mickie Bail, NP 11/28/20 [phone]

## 2020-11-28 NOTE — ED Triage Notes (Signed)
Patient presents to Urgent Care with complaints of fatigue, headache, fever, and back pain since last night.

## 2020-11-28 NOTE — Discharge Instructions (Signed)
Your COVID and Influenza tests are pending.  You should self quarantine until the test results are back.    Take Tylenol as needed for fever or discomfort.  Rest and keep yourself hydrated.    Follow-up with your primary care provider if your symptoms are not improving.

## 2020-11-30 LAB — COVID-19, FLU A+B NAA
Influenza A, NAA: NOT DETECTED
Influenza B, NAA: NOT DETECTED
SARS-CoV-2, NAA: NOT DETECTED

## 2020-12-03 ENCOUNTER — Other Ambulatory Visit: Payer: Self-pay

## 2020-12-03 MED ORDER — HYDRALAZINE HCL 50 MG PO TABS: 50.0000 mg | ORAL_TABLET | Freq: Three times a day (TID) | ORAL | 2 refills | Status: DC

## 2020-12-04 ENCOUNTER — Other Ambulatory Visit: Payer: Self-pay | Admitting: Cardiology

## 2021-02-15 ENCOUNTER — Other Ambulatory Visit: Payer: Self-pay | Admitting: Cardiology

## 2021-02-15 DIAGNOSIS — E785 Hyperlipidemia, unspecified: Secondary | ICD-10-CM

## 2021-03-27 NOTE — Progress Notes (Signed)
HPI: FU nonischemic cardiomyopathy. Cardiac cath at [facility] in 2004 showed normal coronaries and EF 24; endocardial biopsy unrevealing. Note, his cardiomyopathy is felt possibly secondary to ventricular ectopy in the past and he was on amiodarone for that. Previous holter monitor in Dec 2012 showed frequent PVCs and nonsustained ventricular tachycardia. He also has had a previous ICD (generator removed in February of 2014 as his device had reached ERI and his LV function had improved). Abd ultrasound 1/15 showed no aneurysm.  Echocardiogram August 2021 showed ejection fraction 35 to 40%, mild left ventricular hypertrophy, with mild left ventricular enlargement, grade 1 diastolic dysfunction, moderate left atrial enlargement, mild to moderate tricuspid regurgitation and mild to moderate aortic insufficiency.  Ascending aorta mildly dilated at 42 mm.   CTA September 2021 showed 41 mm ascending aortic aneurysm.  Since last seen, he denies dyspnea, chest pain, palpitations or syncope.  Current Outpatient Medications  Medication Sig Dispense Refill   amiodarone (PACERONE) 200 MG tablet TAKE 1 TABLET BY MOUTH EVERY DAY 90 tablet 1   atorvastatin (LIPITOR) 10 MG tablet Take 10 mg by mouth daily.     carvedilol (COREG) 25 MG tablet Take 0.5 tablets (12.5 mg total) by mouth 2 (two) times daily. (Patient taking differently: Take 12.5 mg by mouth as needed (if HR is greater than 60).) 60 tablet 3   DENTA 5000 PLUS 1.1 % CREA dental cream See admin instructions.     diphenhydrAMINE (BENADRYL) 25 mg capsule Take 25 mg by mouth every 6 (six) hours as needed.     EPINEPHrine 0.3 mg/0.3 mL IJ SOAJ injection Inject 0.3 mg into the muscle as needed for anaphylaxis.     famotidine (PEPCID) 40 MG tablet Take 40 mg by mouth 2 (two) times daily.     furosemide (LASIX) 20 MG tablet Take 1 tablet (20 mg total) by mouth daily. 90 tablet 3   gabapentin (NEURONTIN) 300 MG capsule Take 300 mg by mouth at bedtime.      hydrALAZINE (APRESOLINE) 50 MG tablet TAKE 1 & 1/2 TABLET BY MOUTH 3 TIMES DAILY (Patient taking differently: Take 25 mg by mouth 2 (two) times daily. Take 1/2 tablet BID) 135 tablet 5   isosorbide mononitrate (IMDUR) 30 MG 24 hr tablet Take 1 tablet (30 mg total) by mouth daily.     potassium chloride (K-DUR) 10 MEQ tablet Take 1 tablet (10 mEq total) by mouth daily. 90 tablet 3   predniSONE (DELTASONE) 20 MG tablet Take 20 mg by mouth daily with breakfast.     No current facility-administered medications for this visit.     Past Medical History:  Diagnosis Date   Abnormal chest CT    Anaphylactic reaction    Anxiety    Asthma    Benign hypertension    Cardiomyopathy    Cardiomyopathy, idiopathic (HCC)    CHF (congestive heart failure) (HCC)    Chronic renal insufficiency    With creatinine 1.4   Drug-induced urticaria    FH: thoracic aortic aneurysm    GERD (gastroesophageal reflux disease)    History of colon polyps    Hives    Long Hx of hives   Hyperlipidemia    Hyperlipidemia    IMPLANTATION OF DEFIBRILLATOR, HX OF 02/15/2009   Qualifier: Diagnosis of  By: Jens Som, MD, Lyn Hollingshead    NSVT (nonsustained ventricular tachycardia) (HCC)    Osteoarthritis    Osteoarthrosis, unspecified whether generalized or localized, lower leg  Osteoporosis    Oxygen deficiency    Psoriasis    PVC (premature ventricular contraction)    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    Past Surgical History:  Procedure Laterality Date   APPENDECTOMY     BIV ICD GENERTAOR CHANGE OUT N/A 10/08/2012   Procedure: BIV ICD GENERTAOR CHANGE OUT;  Surgeon: Marinus Maw, MD;  Location: [facility] CATH LAB;  Service: Cardiovascular;  Laterality: N/A;   CARDIAC CATHETERIZATION     CARDIAC DEFIBRILLATOR REMOVAL  2014   CATARACT EXTRACTION     CATARACT EXTRACTION W/PHACO Left 03/12/2016   Procedure: CATARACT EXTRACTION PHACO AND INTRAOCULAR LENS PLACEMENT (IOC);  Surgeon: Sallee Lange, MD;   Location: [facility] ORS;  Service: Ophthalmology;  Laterality: Left;  [place] 01:32 AP% 25.7 CDE 41.33 fluid pack lot # [id number]   COLONOSCOPY WITH PROPOFOL N/A 04/26/2019   Procedure: COLONOSCOPY WITH PROPOFOL;  Surgeon: Christena Deem, MD;  Location: [facility] ENDOSCOPY;  Service: Endoscopy;  Laterality: N/A;   ESOPHAGOGASTRODUODENOSCOPY     EYE SURGERY     Implantation of dual-chamber ICD.     St. Jude DDD-ICD 1/07   JOINT REPLACEMENT Right 08/16/2009   KNEE ARTHROPLASTY Left 2006   KNEE ARTHROPLASTY Right 12/10   x2   LAPAROSCOPIC APPENDECTOMY N/A 08/12/2018   Procedure: APPENDECTOMY LAPAROSCOPIC;  Surgeon: Henrene Dodge, MD;  Location: [facility] ORS;  Service: General;  Laterality: N/A;   TONSILLECTOMY      Social History   Socioeconomic History   Marital status: Married    Spouse name: Not on file   Number of children: Not on file   Years of education: Not on file   Highest education level: Not on file  Occupational History   Not on file  Tobacco Use   Smoking status: Former    Packs/day: 1.00    Years: 10.00    Pack years: 10.00    Types: Cigarettes    Quit date: 12/31/1970    Years since quitting: 50.3   Smokeless tobacco: Never  Vaping Use   Vaping Use: Never used  Substance and Sexual Activity   Alcohol use: Yes    Alcohol/week: 0.0 standard drinks    Comment: 2 DRINKS PER DAY, none this week (7/26)   Drug use: Never   Sexual activity: Not Currently  Other Topics Concern   Not on file  Social History Narrative   Not on file   Social Determinants of Health   Financial Resource Strain: Not on file  Food Insecurity: Not on file  Transportation Needs: Not on file  Physical Activity: Not on file  Stress: Not on file  Social Connections: Not on file  Intimate Partner Violence: Not on file    Family History  Problem Relation Age of Onset   Cancer Mother    Lung cancer Mother    Heart attack Father     ROS: Knee pain but no fevers or chills, productive cough,  hemoptysis, dysphasia, odynophagia, melena, hematochezia, dysuria, hematuria, rash, seizure activity, orthopnea, PND, pedal edema, claudication. Remaining systems are negative.  Physical Exam: Well-developed well-nourished in no acute distress.  Skin is warm and dry.  HEENT is normal.  Neck is supple.  Chest is clear to auscultation with normal expansion.  Cardiovascular exam is regular rate and rhythm.  Abdominal exam nontender or distended. No masses palpated. Extremities show no edema. neuro grossly intact  ECG-sinus bradycardia at a rate of 55, first-degree AV block, nonspecific ST changes, prolonged QT interval.  Personally reviewed  A/P  1 nonischemic cardiomyopathy-this was previously felt potentially related to ectopy.  He has an allergy to ACE inhibitors and ARB's.  We will therefore continue hydralazine/nitrates and (change to 6.25 BID as he states his HR is in the 40s occasionally..  Add Jardiance 10 mg daily.  Repeat echo to reassess LV function.  2 PVCs-continue amiodarone.  Note amiodarone was discontinued previously due to potential changes on chest x-ray but these changes improved with antibiotics and it was resumed.   3 chronic systolic congestive heart failure-he is euvolemic.  Continue Lasix at present dose.  Add spironolactone 12.5 mg daily.  Check potassium and renal function 1 week.  4 history of dilated ascending aorta-plan follow-up CTA September 2022.  5 hypertension-blood pressure controlled.  Continue present medical regimen.  6 hyperlipidemia-continue statin.  Olga Millers, MD

## 2021-04-02 ENCOUNTER — Other Ambulatory Visit: Payer: Self-pay | Admitting: Cardiology

## 2021-04-08 ENCOUNTER — Ambulatory Visit: Payer: Medicare Other | Admitting: Cardiology

## 2021-04-08 ENCOUNTER — Encounter: Payer: Self-pay | Admitting: Cardiology

## 2021-04-08 ENCOUNTER — Other Ambulatory Visit: Payer: Self-pay

## 2021-04-08 VITALS — BP 110/62 | HR 55 | Ht 69.0 in | Wt 173.2 lb

## 2021-04-08 DIAGNOSIS — I428 Other cardiomyopathies: Secondary | ICD-10-CM | POA: Diagnosis not present

## 2021-04-08 DIAGNOSIS — I712 Thoracic aortic aneurysm, without rupture, unspecified: Secondary | ICD-10-CM

## 2021-04-08 DIAGNOSIS — N1831 Chronic kidney disease, stage 3a: Secondary | ICD-10-CM | POA: Diagnosis not present

## 2021-04-08 MED ORDER — CARVEDILOL 6.25 MG PO TABS: 6.2500 mg | ORAL_TABLET | Freq: Two times a day (BID) | ORAL | 3 refills | Status: DC

## 2021-04-08 MED ORDER — SPIRONOLACTONE 25 MG PO TABS: 12.5000 mg | ORAL_TABLET | Freq: Every day | ORAL | 3 refills | Status: DC

## 2021-04-08 MED ORDER — EMPAGLIFLOZIN 10 MG PO TABS: 10.0000 mg | ORAL_TABLET | Freq: Every day | ORAL | 12 refills | Status: DC

## 2021-04-08 NOTE — Patient Instructions (Addendum)
Medication Instructions:   START JARDIANCE 10 MG ONCE DAILY  START SPIRONOLACTONE 12.5 MG ONCE DAILY= 1/2 OF THE 25 MG TABLET ONCE DAILY  DECREASE CARVEDILOL TO 6.25 MG TWICE DAILY   *If you need a refill on your cardiac medications before your next appointment, please call your pharmacy*   Lab Work:  Your physician recommends that you HAVE LAB WORK IN ONE WEEK  If you have labs (blood work) drawn today and your tests are completely normal, you will receive your results only by: MyChart Message (if you have MyChart) OR A paper copy in the mail If you have any lab test that is abnormal or we need to change your treatment, we will call you to review the results.   Testing/Procedures:  Your physician has requested that you have an echocardiogram. Echocardiography is a painless test that uses sound waves to create images of your heart. It provides your doctor with information about the size and shape of your heart and how well your heart's chambers and valves are working. This procedure takes approximately one hour. There are no restrictions for this procedure. [address]  CT WO CONTRAST TO FOLLOW UP ON THORACIC ANEURYSM AT  IMAGING=[phone]   Follow-Up: At [facility], you and your health needs are our priority.  As part of our continuing mission to provide you with exceptional heart care, we have created designated Provider Care Teams.  These Care Teams include your primary Cardiologist (physician) and Advanced Practice Providers (APPs -  Physician Assistants and Nurse Practitioners) who all work together to provide you with the care you need, when you need it.  We recommend signing up for the patient portal called "MyChart".  Sign up information is provided on this After Visit Summary.  MyChart is used to connect with patients for Virtual Visits (Telemedicine).  Patients are able to view lab/test results, encounter notes, upcoming appointments,  etc.  Non-urgent messages can be sent to your provider as well.   To learn more about what you can do with MyChart, go to [email or web address].    Your next appointment:   6 month(s)  The format for your next appointment:   In Person  Provider:   Olga Millers, MD

## 2021-04-18 LAB — BASIC METABOLIC PANEL
BUN/Creatinine Ratio: 7 — ABNORMAL LOW (ref 10–24)
BUN: 11 mg/dL (ref 8–27)
CO2: 24 mmol/L (ref 20–29)
Calcium: 9 mg/dL (ref 8.6–10.2)
Chloride: 103 mmol/L (ref 96–106)
Creatinine, Ser: 1.62 mg/dL — ABNORMAL HIGH (ref 0.76–1.27)
Glucose: 99 mg/dL (ref 65–99)
Potassium: 4.7 mmol/L (ref 3.5–5.2)
Sodium: 140 mmol/L (ref 134–144)
eGFR: 45 mL/min/{1.73_m2} — ABNORMAL LOW (ref >59)

## 2021-04-19 ENCOUNTER — Ambulatory Visit
Admission: RE | Admit: 2021-04-19 | Discharge: 2021-04-19 | Disposition: A | Discharge status: Home / Self Care | Payer: Medicare Other | Source: Ambulatory Visit | Attending: Cardiology | Admitting: Cardiology

## 2021-04-19 DIAGNOSIS — I712 Thoracic aortic aneurysm, without rupture, unspecified: Secondary | ICD-10-CM

## 2021-04-25 ENCOUNTER — Other Ambulatory Visit: Payer: Self-pay

## 2021-04-25 ENCOUNTER — Ambulatory Visit (HOSPITAL_COMMUNITY): Payer: Medicare Other | Attending: Cardiology

## 2021-04-25 DIAGNOSIS — I428 Other cardiomyopathies: Secondary | ICD-10-CM | POA: Insufficient documentation

## 2021-04-25 LAB — ECHOCARDIOGRAM COMPLETE
AR max vel: 2 cm2
AV Area VTI: 2.08 cm2
AV Area mean vel: 1.88 cm2
AV Mean grad: 5.5 mmHg
AV Peak grad: 10 mmHg
Ao pk vel: 1.58 m/s
Area-P 1/2: 2.32 cm2
P 1/2 time: 652 msec
S' Lateral: 5.6 cm

## 2021-05-17 NOTE — Progress Notes (Signed)
HPI: FU nonischemic cardiomyopathy. Cardiac cath at [facility] in 2004 showed normal coronaries and EF 24; endocardial biopsy unrevealing. Note, his cardiomyopathy is felt possibly secondary to ventricular ectopy in the past and he was on amiodarone for that. Previous holter monitor in Dec 2012 showed frequent PVCs and nonsustained ventricular tachycardia. He also has had a previous ICD (generator removed in February of 2014 as his device had reached ERI and his LV function had improved). Abd ultrasound 1/15 showed no aneurysm.  Echocardiogram August 2021 showed ejection fraction 35 to 40%, mild left ventricular hypertrophy, with mild left ventricular enlargement, grade 1 diastolic dysfunction, moderate left atrial enlargement, mild to moderate tricuspid regurgitation and mild to moderate aortic insufficiency.  Ascending aorta mildly dilated at 42 mm.   CTA September 2022 showed 41 mm ascending aortic aneurysm.  Echo repeated 9/22 and showed EF 25-30, moderate to severe LVE, grade 1 DD, moderate RVE, mild LAE, mild MR, mild to moderate TR, mild to moderate AI; mildly dilated ascending aorta (43 mm). Since last seen, he denies dyspnea, chest pain, palpitations or syncope.  No pedal edema.  Current Outpatient Medications  Medication Sig Dispense Refill   amiodarone (PACERONE) 200 MG tablet TAKE 1 TABLET BY MOUTH EVERY DAY 90 tablet 1   atorvastatin (LIPITOR) 10 MG tablet Take 10 mg by mouth daily.     carvedilol (COREG) 6.25 MG tablet Take 1 tablet (6.25 mg total) by mouth 2 (two) times daily. 180 tablet 3   DENTA 5000 PLUS 1.1 % CREA dental cream See admin instructions.     diphenhydrAMINE (BENADRYL) 25 mg capsule Take 25 mg by mouth every 6 (six) hours as needed.     empagliflozin (JARDIANCE) 10 MG TABS tablet Take 1 tablet (10 mg total) by mouth daily before breakfast. 30 tablet 12   EPINEPHrine 0.3 mg/0.3 mL IJ SOAJ injection Inject 0.3 mg into the muscle as needed for anaphylaxis.     famotidine  (PEPCID) 40 MG tablet Take 40 mg by mouth 2 (two) times daily.     furosemide (LASIX) 20 MG tablet Take 1 tablet (20 mg total) by mouth daily. 90 tablet 3   gabapentin (NEURONTIN) 300 MG capsule Take 300 mg by mouth at bedtime.     hydrALAZINE (APRESOLINE) 50 MG tablet Take 0.5 tablets (25 mg total) by mouth 2 (two) times daily. Take 1/2 tablet BID 180 tablet 1   potassium chloride (K-DUR) 10 MEQ tablet Take 1 tablet (10 mEq total) by mouth daily. 90 tablet 3   predniSONE (DELTASONE) 20 MG tablet Take 20 mg by mouth daily with breakfast.     spironolactone (ALDACTONE) 25 MG tablet Take 0.5 tablets (12.5 mg total) by mouth daily. 45 tablet 3   isosorbide mononitrate (IMDUR) 30 MG 24 hr tablet Take 1 tablet (30 mg total) by mouth daily.     No current facility-administered medications for this visit.     Past Medical History:  Diagnosis Date   Abnormal chest CT    Anaphylactic reaction    Anxiety    Asthma    Benign hypertension    Cardiomyopathy    Cardiomyopathy, idiopathic (HCC)    CHF (congestive heart failure) (HCC)    Chronic renal insufficiency    With creatinine 1.4   Drug-induced urticaria    FH: thoracic aortic aneurysm    GERD (gastroesophageal reflux disease)    History of colon polyps    Hives    Long Hx of hives  Hyperlipidemia    Hyperlipidemia    IMPLANTATION OF DEFIBRILLATOR, HX OF 02/15/2009   Qualifier: Diagnosis of  By: Jens Som, MD, Lyn Hollingshead    NSVT (nonsustained ventricular tachycardia)    Osteoarthritis    Osteoarthrosis, unspecified whether generalized or localized, lower leg    Osteoporosis    Oxygen deficiency    Psoriasis    PVC (premature ventricular contraction)    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    Past Surgical History:  Procedure Laterality Date   APPENDECTOMY     BIV ICD GENERTAOR CHANGE OUT N/A 10/08/2012   Procedure: BIV ICD GENERTAOR CHANGE OUT;  Surgeon: Marinus Maw, MD;  Location: [facility] CATH LAB;  Service:  Cardiovascular;  Laterality: N/A;   CARDIAC CATHETERIZATION     CARDIAC DEFIBRILLATOR REMOVAL  2014   CATARACT EXTRACTION     CATARACT EXTRACTION W/PHACO Left 03/12/2016   Procedure: CATARACT EXTRACTION PHACO AND INTRAOCULAR LENS PLACEMENT (IOC);  Surgeon: Sallee Lange, MD;  Location: [facility] ORS;  Service: Ophthalmology;  Laterality: Left;  [place] 01:32 AP% 25.7 CDE 41.33 fluid pack lot # [id number]   COLONOSCOPY WITH PROPOFOL N/A 04/26/2019   Procedure: COLONOSCOPY WITH PROPOFOL;  Surgeon: Christena Deem, MD;  Location: [facility] ENDOSCOPY;  Service: Endoscopy;  Laterality: N/A;   ESOPHAGOGASTRODUODENOSCOPY     EYE SURGERY     Implantation of dual-chamber ICD.     St. Jude DDD-ICD 1/07   JOINT REPLACEMENT Right 08/16/2009   KNEE ARTHROPLASTY Left 2006   KNEE ARTHROPLASTY Right 12/10   x2   LAPAROSCOPIC APPENDECTOMY N/A 08/12/2018   Procedure: APPENDECTOMY LAPAROSCOPIC;  Surgeon: Henrene Dodge, MD;  Location: [facility] ORS;  Service: General;  Laterality: N/A;   TONSILLECTOMY      Social History   Socioeconomic History   Marital status: Married    Spouse name: Not on file   Number of children: Not on file   Years of education: Not on file   Highest education level: Not on file  Occupational History   Not on file  Tobacco Use   Smoking status: Former    Packs/day: 1.00    Years: 10.00    Pack years: 10.00    Types: Cigarettes    Quit date: 12/31/1970    Years since quitting: 50.4   Smokeless tobacco: Never  Vaping Use   Vaping Use: Never used  Substance and Sexual Activity   Alcohol use: Yes    Alcohol/week: 0.0 standard drinks    Comment: 2 DRINKS PER DAY, none this week (7/26)   Drug use: Never   Sexual activity: Not Currently  Other Topics Concern   Not on file  Social History Narrative   Not on file   Social Determinants of Health   Financial Resource Strain: Not on file  Food Insecurity: Not on file  Transportation Needs: Not on file  Physical Activity: Not on file   Stress: Not on file  Social Connections: Not on file  Intimate Partner Violence: Not on file    Family History  Problem Relation Age of Onset   Cancer Mother    Lung cancer Mother    Heart attack Father     ROS: no fevers or chills, productive cough, hemoptysis, dysphasia, odynophagia, melena, hematochezia, dysuria, hematuria, rash, seizure activity, orthopnea, PND, pedal edema, claudication. Remaining systems are negative.  Physical Exam: Well-developed well-nourished in no acute distress.  Skin is warm and dry.  HEENT is normal.  Neck is supple.  Chest  is clear to auscultation with normal expansion.  Cardiovascular exam is regular rate and rhythm.  Abdominal exam nontender or distended. No masses palpated. Extremities show no edema. neuro grossly intact  A/P  1 nonischemic cardiomyopathy-LV function worse compared to previous. Previously felt potentially related to ectopy.  He has an allergy to ACE inhibitors and ARB's.  We will therefore continue hydralazine/nitrates and coreg.  Continue Jardiance 10 mg daily.  Arrange lexiscan nuclear study to screen for ischemia. Will avoid CTA given baseline renal insuff.  If nuclear study not suggestive of ischemia we will arrange follow-up with Dr. Ladona Ridgel.  His ICD generator was removed in the past as his LV function had improved but it is now 25 to 30% (I personally reviewed the echocardiogram).  Would likely therefore need to place the generator.   2 PVCs-continue amiodarone as felt to potentially be contributing to LV dysfunction.  Note amiodarone was discontinued previously due to potential changes on chest x-ray but these changes improved with antibiotics and it was resumed.    3 chronic systolic congestive heart failure-euvolemic; continue lasix and spironolactone.    4 history of dilated ascending aorta-plan follow-up CTA September 2023.   5 hypertension-blood pressure controlled.  Continue present medical regimen.   6  hyperlipidemia-continue statin.  Olga Millers, MD

## 2021-05-27 ENCOUNTER — Other Ambulatory Visit: Payer: Self-pay | Admitting: Cardiology

## 2021-05-29 ENCOUNTER — Telehealth (HOSPITAL_COMMUNITY): Payer: Self-pay | Admitting: *Deleted

## 2021-05-29 ENCOUNTER — Encounter: Payer: Self-pay | Admitting: Cardiology

## 2021-05-29 ENCOUNTER — Ambulatory Visit: Payer: Medicare Other | Admitting: Cardiology

## 2021-05-29 ENCOUNTER — Other Ambulatory Visit: Payer: Self-pay

## 2021-05-29 VITALS — BP 124/58 | HR 54 | Ht 68.0 in | Wt 170.2 lb

## 2021-05-29 DIAGNOSIS — I5022 Chronic systolic (congestive) heart failure: Secondary | ICD-10-CM

## 2021-05-29 DIAGNOSIS — I10 Essential (primary) hypertension: Secondary | ICD-10-CM | POA: Diagnosis not present

## 2021-05-29 DIAGNOSIS — E7800 Pure hypercholesterolemia, unspecified: Secondary | ICD-10-CM | POA: Diagnosis not present

## 2021-05-29 DIAGNOSIS — I428 Other cardiomyopathies: Secondary | ICD-10-CM | POA: Diagnosis not present

## 2021-05-29 DIAGNOSIS — I712 Thoracic aortic aneurysm, without rupture, unspecified: Secondary | ICD-10-CM

## 2021-05-29 NOTE — Telephone Encounter (Signed)
Patient given detailed instructions per Myocardial Perfusion Study Information Sheet for the test on 06/03/21 at 7:45. Patient notified to arrive 15 minutes early and that it is imperative to arrive on time for appointment to keep from having the test rescheduled.  If you need to cancel or reschedule your appointment, please call the office within 24 hours of your appointment. . Patient verbalized understanding.[name]

## 2021-05-29 NOTE — Patient Instructions (Signed)
Testing/Procedures: Your physician has requested that you have a lexiscan myoview. For further information please visit [email or web address] Please follow instruction sheet, as given. [address] [employer]  Follow-Up: At [facility], you and your health needs are our priority.  As part of our continuing mission to provide you with exceptional heart care, we have created designated Provider Care Teams.  These Care Teams include your primary Cardiologist (physician) and Advanced Practice Providers (APPs -  Physician Assistants and Nurse Practitioners) who all work together to provide you with the care you need, when you need it.  We recommend signing up for the patient portal called "MyChart".  Sign up information is provided on this After Visit Summary.  MyChart is used to connect with patients for Virtual Visits (Telemedicine).  Patients are able to view lab/test results, encounter notes, upcoming appointments, etc.  Non-urgent messages can be sent to your provider as well.   To learn more about what you can do with MyChart, go to [email or web address].    Your next appointment:   6 month(s)  The format for your next appointment:   In Person  Provider:   Olga Millers, MD

## 2021-06-03 ENCOUNTER — Other Ambulatory Visit: Payer: Self-pay

## 2021-06-03 ENCOUNTER — Ambulatory Visit (HOSPITAL_COMMUNITY): Payer: Medicare Other | Attending: Internal Medicine

## 2021-06-03 DIAGNOSIS — I428 Other cardiomyopathies: Secondary | ICD-10-CM | POA: Diagnosis present

## 2021-06-03 DIAGNOSIS — I712 Thoracic aortic aneurysm, without rupture, unspecified: Secondary | ICD-10-CM

## 2021-06-03 LAB — MYOCARDIAL PERFUSION IMAGING
Base ST Depression (mm): 0 mm
LV dias vol: 227 mL (ref 62–150)
LV sys vol: 140 mL
Nuc Stress EF: 39 %
Peak HR: 67 {beats}/min
Rest HR: 56 {beats}/min
Rest Nuclear Isotope Dose: 10.9 mCi
SDS: 2
SRS: 3
SSS: 5
ST Depression (mm): 0 mm
Stress Nuclear Isotope Dose: 32.2 mCi
TID: 1

## 2021-06-03 MED ORDER — TECHNETIUM TC 99M TETROFOSMIN IV KIT
32.2000 | PACK | Freq: Once | INTRAVENOUS | Status: AC | PRN
  Administered 2021-06-03: 32.2 via INTRAVENOUS
  Filled 2021-06-03: qty 33

## 2021-06-03 MED ORDER — TECHNETIUM TC 99M TETROFOSMIN IV KIT
10.9000 | PACK | Freq: Once | INTRAVENOUS | Status: AC | PRN
  Administered 2021-06-03: 10.9 via INTRAVENOUS
  Filled 2021-06-03: qty 11

## 2021-06-03 MED ORDER — REGADENOSON 0.4 MG/5ML IV SOLN
0.4000 mg | Freq: Once | INTRAVENOUS | Status: AC
  Administered 2021-06-03: 0.4 mg via INTRAVENOUS

## 2021-06-05 ENCOUNTER — Other Ambulatory Visit: Payer: Self-pay | Admitting: Cardiology

## 2021-06-05 DIAGNOSIS — E785 Hyperlipidemia, unspecified: Secondary | ICD-10-CM

## 2021-06-06 ENCOUNTER — Encounter: Payer: Self-pay | Admitting: General Surgery

## 2021-09-27 NOTE — Progress Notes (Signed)
HPI: FU nonischemic cardiomyopathy. Cardiac cath at [facility] in 2004 showed normal coronaries and EF 24; endocardial biopsy unrevealing. Note, his cardiomyopathy is felt possibly secondary to ventricular ectopy in the past and he was on amiodarone. Previous holter monitor in Dec 2012 showed frequent PVCs and nonsustained ventricular tachycardia. He also has had a previous ICD (generator removed in February of 2014 as his device had reached ERI and his LV function had improved). Abd ultrasound 1/15 showed no aneurysm.  CTA September 2022 showed 41 mm ascending aortic aneurysm.  Echo repeated 9/22 and showed EF 25-30, moderate to severe LVE, grade 1 DD, moderate RVE, mild LAE, mild MR, mild to moderate TR, mild to moderate AI; mildly dilated ascending aorta (43 mm).  Nuclear study October 2022 showed ejection fraction 39%, prior inferior infarct but no ischemia.  Since last seen, he denies dyspnea, chest pain, palpitations, syncope or pedal edema.  Current Outpatient Medications  Medication Sig Dispense Refill   amiodarone (PACERONE) 200 MG tablet TAKE 1 TABLET BY MOUTH EVERY DAY 30 tablet 1   atorvastatin (LIPITOR) 10 MG tablet Take 10 mg by mouth daily.     carvedilol (COREG) 6.25 MG tablet Take 1 tablet (6.25 mg total) by mouth 2 (two) times daily. 180 tablet 3   DENTA 5000 PLUS 1.1 % CREA dental cream See admin instructions.     diphenhydrAMINE (BENADRYL) 25 mg capsule Take 25 mg by mouth every 6 (six) hours as needed.     empagliflozin (JARDIANCE) 10 MG TABS tablet Take 1 tablet (10 mg total) by mouth daily before breakfast. 30 tablet 12   EPINEPHrine 0.3 mg/0.3 mL IJ SOAJ injection Inject 0.3 mg into the muscle as needed for anaphylaxis.     famotidine (PEPCID) 40 MG tablet Take 40 mg by mouth 2 (two) times daily.     gabapentin (NEURONTIN) 300 MG capsule Take 300 mg by mouth at bedtime.     hydrALAZINE (APRESOLINE) 50 MG tablet Take 0.5 tablets (25 mg total) by mouth 2 (two) times daily. Take  1/2 tablet BID 180 tablet 1   isosorbide mononitrate (IMDUR) 30 MG 24 hr tablet Take 1 tablet (30 mg total) by mouth daily.     spironolactone (ALDACTONE) 25 MG tablet Take 0.5 tablets (12.5 mg total) by mouth daily. 45 tablet 3   No current facility-administered medications for this visit.     Past Medical History:  Diagnosis Date   Abnormal chest CT    Anaphylactic reaction    Anxiety    Asthma    Benign hypertension    Cardiomyopathy    Cardiomyopathy, idiopathic (HCC)    CHF (congestive heart failure) (HCC)    Chronic renal insufficiency    With creatinine 1.4   Drug-induced urticaria    FH: thoracic aortic aneurysm    GERD (gastroesophageal reflux disease)    History of colon polyps    Hives    Long Hx of hives   Hyperlipidemia    Hyperlipidemia    IMPLANTATION OF DEFIBRILLATOR, HX OF 02/15/2009   Qualifier: Diagnosis of  By: Stanford Breed, MD, Kandyce Rud    NSVT (nonsustained ventricular tachycardia)    Osteoarthritis    Osteoarthrosis, unspecified whether generalized or localized, lower leg    Osteoporosis    Oxygen deficiency    Psoriasis    PVC (premature ventricular contraction)    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    Past Surgical History:  Procedure Laterality Date  APPENDECTOMY     BIV ICD GENERTAOR CHANGE OUT N/A 10/08/2012   Procedure: BIV ICD GENERTAOR CHANGE OUT;  Surgeon: Evans Lance, MD;  Location: [facility] CATH LAB;  Service: Cardiovascular;  Laterality: N/A;   CARDIAC CATHETERIZATION     CARDIAC DEFIBRILLATOR REMOVAL  2014   CATARACT EXTRACTION     CATARACT EXTRACTION W/PHACO Left 03/12/2016   Procedure: CATARACT EXTRACTION PHACO AND INTRAOCULAR LENS PLACEMENT ([place]);  Surgeon: Estill Cotta, MD;  Location: [facility] ORS;  Service: Ophthalmology;  Laterality: Left;  [place] 01:32 AP% 25.7 CDE 41.33 fluid pack lot # [id number]   COLONOSCOPY WITH PROPOFOL N/A 04/26/2019   Procedure: COLONOSCOPY WITH PROPOFOL;  Surgeon: Lollie Sails, MD;   Location: [facility] ENDOSCOPY;  Service: Endoscopy;  Laterality: N/A;   ESOPHAGOGASTRODUODENOSCOPY     EYE SURGERY     Implantation of dual-chamber ICD.     St. Jude DDD-ICD 1/07   JOINT REPLACEMENT Right 08/16/2009   KNEE ARTHROPLASTY Left 2006   KNEE ARTHROPLASTY Right 12/10   x2   LAPAROSCOPIC APPENDECTOMY N/A 08/12/2018   Procedure: APPENDECTOMY LAPAROSCOPIC;  Surgeon: Olean Ree, MD;  Location: [facility] ORS;  Service: General;  Laterality: N/A;   TONSILLECTOMY      Social History   Socioeconomic History   Marital status: Married    Spouse name: Not on file   Number of children: Not on file   Years of education: Not on file   Highest education level: Not on file  Occupational History   Not on file  Tobacco Use   Smoking status: Former    Packs/day: 1.00    Years: 10.00    Pack years: 10.00    Types: Cigarettes    Quit date: 12/31/1970    Years since quitting: 50.8   Smokeless tobacco: Never  Vaping Use   Vaping Use: Never used  Substance and Sexual Activity   Alcohol use: Yes    Alcohol/week: 0.0 standard drinks    Comment: 2 DRINKS PER DAY, none this week (7/26)   Drug use: Never   Sexual activity: Not Currently  Other Topics Concern   Not on file  Social History Narrative   Not on file   Social Determinants of Health   Financial Resource Strain: Not on file  Food Insecurity: Not on file  Transportation Needs: Not on file  Physical Activity: Not on file  Stress: Not on file  Social Connections: Not on file  Intimate Partner Violence: Not on file    Family History  Problem Relation Age of Onset   Cancer Mother    Lung cancer Mother    Heart attack Father     ROS: no fevers or chills, productive cough, hemoptysis, dysphasia, odynophagia, melena, hematochezia, dysuria, hematuria, rash, seizure activity, orthopnea, PND, pedal edema, claudication. Remaining systems are negative.  Physical Exam: Well-developed well-nourished in no acute distress.  Skin is  warm and dry.  HEENT is normal.  Neck is supple.  Chest is clear to auscultation with normal expansion.  Cardiovascular exam is regular rate and rhythm.  Abdominal exam nontender or distended. No masses palpated. Extremities show no edema. neuro grossly intact  A/P  1 nonischemic cardiomyopathy-as outlined in previous notes patient's LV function was worse on most recent echocardiogram.  Follow-up nuclear study showed infarct but no ischemia.  Continue hydralazine/nitrates and carvedilol.  Continue Jardiance.  We discussed ICD generator replacement today.  His ICD generator had been removed in the past after LV function had improved but now  less than 30%.  He will discuss this with his wife and if agreeable we will arrange follow-up with Dr. Lovena Le to see if he is a good candidate.  2 history of PVCs-plan to continue amiodarone as PVCs were felt to be contributing to LV dysfunction in the past.  He is due to have blood work drawn with his primary care physician.  We will ask for TSH and liver functions at that time.  3 chronic systolic congestive heart failure-continue Lasix, spironolactone and Farxiga.  Await follow-up renal function and potassium with blood work scheduled for March.  4 dilated ascending aorta-patient will need follow-up noncontrast chest CT September 2023.  We are avoiding contrast given his baseline renal insufficiency.  5 hypertension-blood pressure controlled.  Continue present medical regimen.  6 hyperlipidemia-continue statin.  Kirk Ruths, MD

## 2021-09-29 ENCOUNTER — Other Ambulatory Visit: Payer: Self-pay | Admitting: Cardiology

## 2021-10-10 ENCOUNTER — Encounter: Payer: Self-pay | Admitting: Cardiology

## 2021-10-10 ENCOUNTER — Ambulatory Visit: Payer: Medicare Other | Admitting: Cardiology

## 2021-10-10 ENCOUNTER — Other Ambulatory Visit: Payer: Self-pay

## 2021-10-10 VITALS — BP 134/72 | HR 54 | Ht 69.0 in | Wt 167.6 lb

## 2021-10-10 DIAGNOSIS — I5022 Chronic systolic (congestive) heart failure: Secondary | ICD-10-CM

## 2021-10-10 DIAGNOSIS — I428 Other cardiomyopathies: Secondary | ICD-10-CM | POA: Diagnosis not present

## 2021-10-10 DIAGNOSIS — E7800 Pure hypercholesterolemia, unspecified: Secondary | ICD-10-CM | POA: Diagnosis not present

## 2021-10-10 DIAGNOSIS — I712 Thoracic aortic aneurysm, without rupture, unspecified: Secondary | ICD-10-CM

## 2021-10-10 DIAGNOSIS — I10 Essential (primary) hypertension: Secondary | ICD-10-CM | POA: Diagnosis not present

## 2021-10-10 NOTE — Patient Instructions (Signed)

## 2021-10-14 ENCOUNTER — Other Ambulatory Visit: Payer: Self-pay | Admitting: Cardiology

## 2021-10-21 ENCOUNTER — Ambulatory Visit: Payer: Medicare Other | Admitting: Internal Medicine

## 2021-10-21 ENCOUNTER — Encounter: Payer: Self-pay | Admitting: Internal Medicine

## 2021-10-21 ENCOUNTER — Other Ambulatory Visit: Payer: Self-pay

## 2021-10-21 VITALS — BP 128/72 | HR 63 | Ht 69.0 in | Wt 168.8 lb

## 2021-10-21 DIAGNOSIS — I428 Other cardiomyopathies: Secondary | ICD-10-CM | POA: Diagnosis not present

## 2021-10-21 NOTE — Progress Notes (Signed)
? ? ? ? ?HPI ?Mr. Widen is referred back by [name] for evaluation of LV dysfunction. He initially presented almost 20 years ago with LV dysfunction, thought due to PVC's and was placed on amiodarone and has done well for many years. His EF normalized and we did not replaced his ICD. His EF has tended back down and repeat echo shows an EF of 30% with a Stress myoview demonstrating an EF of 39%. The patient feels well and denies syncope or palpitations. He has remained on amiodarone for all of these years without side effect.   ?Allergies  ?Allergen Reactions  ? Beef Allergy Anaphylaxis  ? 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 Pork-Derived Products Anaphylaxis  ? Ace Inhibitors Other (See Comments)  ?  Other Reaction: Other reaction/ ?Urticaria secondary to COX-1 inhibitors ?Angioedema  ? Aspirin Other (See Comments)  ?  Other Reaction: Other reaction  ? Indocin [Indomethacin] Other (See Comments)  ? Mobic [Meloxicam] Other (See Comments)  ? Nsaids Other (See Comments)  ?  Other Reaction: Other reaction ?Other Reaction: Other reaction  ? ? ? ?Current Outpatient Medications  ?Medication Sig Dispense Refill  ? amiodarone (PACERONE) 200 MG tablet TAKE 1 TABLET BY MOUTH EVERY DAY 90 tablet 1  ? atorvastatin (LIPITOR) 10 MG tablet Take 10 mg by mouth daily.    ? carvedilol (COREG) 6.25 MG tablet Take 1 tablet (6.25 mg total) by mouth 2 (two) times daily. 180 tablet 3  ? DENTA 5000 PLUS 1.1 % CREA dental cream See admin instructions.    ? diphenhydrAMINE (BENADRYL) 25 mg capsule Take 25 mg by mouth every 6 (six) hours as needed.    ? empagliflozin (JARDIANCE) 10 MG TABS tablet Take 1 tablet (10 mg total) by mouth daily before breakfast. 30 tablet 12  ? EPINEPHrine 0.3 mg/0.3 mL IJ SOAJ injection Inject 0.3 mg into the muscle as needed for anaphylaxis.    ? famotidine (PEPCID) 40 MG tablet Take 40 mg by mouth 2 (two) times daily.    ? gabapentin (NEURONTIN) 300 MG capsule Take 300 mg by mouth at bedtime.    ? hydrALAZINE  (APRESOLINE) 50 MG tablet Take 0.5 tablets (25 mg total) by mouth 2 (two) times daily. Take 1/2 tablet BID 180 tablet 1  ? isosorbide mononitrate (IMDUR) 30 MG 24 hr tablet Take 1 tablet (30 mg total) by mouth daily.    ? spironolactone (ALDACTONE) 25 MG tablet Take 0.5 tablets (12.5 mg total) by mouth daily. 45 tablet 3  ? ?No current facility-administered medications for this visit.  ? ? ? ?Past Medical History:  ?Diagnosis Date  ? Abnormal chest CT   ? Anaphylactic reaction   ? Anxiety   ? Asthma   ? Benign hypertension   ? Cardiomyopathy   ? Cardiomyopathy, idiopathic (HCC)   ? CHF (congestive heart failure) (HCC)   ? Chronic renal insufficiency   ? With creatinine 1.4  ? Drug-induced urticaria   ? FH: thoracic aortic aneurysm   ? GERD (gastroesophageal reflux disease)   ? History of colon polyps   ? Hives   ? Long Hx of hives  ? Hyperlipidemia   ? Hyperlipidemia   ? IMPLANTATION OF DEFIBRILLATOR, HX OF 02/15/2009  ? Qualifier: Diagnosis of  By: Jens Som, MD, Lyn Hollingshead   ? NSVT (nonsustained ventricular tachycardia)   ? Osteoarthritis   ? Osteoarthrosis, unspecified whether generalized or localized, lower leg   ? Osteoporosis   ? Oxygen deficiency   ? Psoriasis   ?  PVC (premature ventricular contraction)   ? 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 ? ?ROS: ? ? All systems reviewed and negative except as noted in the HPI. ? ? ?Past Surgical History:  ?Procedure Laterality Date  ? APPENDECTOMY    ? BIV ICD GENERTAOR CHANGE OUT N/A 10/08/2012  ? Procedure: BIV ICD GENERTAOR CHANGE OUT;  Surgeon: Marinus Maw, MD;  Location[facility] CATH LAB;  Service: Cardiovascular;  Laterality: N/A;  ? CARDIAC CATHETERIZATION    ? CARDIAC DEFIBRILLATOR REMOVAL  2014  ? CATARACT EXTRACTION    ? CATARACT EXTRACTION W/PHACO Left 03/12/2016  ? Procedure: CATARACT EXTRACTION PHACO AND INTRAOCULAR LENS PLACEMENT ([facility]);  Surgeon: Sallee Lange, MD;  Location: [facility] ORS;  Service: Ophthalmology;  Laterality: Left;  [place]  01:32 ?AP% 25.7 ?CDE 41.33 ?fluid pack lot # [id number]  ? COLONOSCOPY WITH PROPOFOL N/A 04/26/2019  ? Procedure: COLONOSCOPY WITH PROPOFOL;  Surgeon: Christena Deem, MD;  Location: [facility] ENDOSCOPY;  Service: Endoscopy;  Laterality: N/A;  ? ESOPHAGOGASTRODUODENOSCOPY    ? EYE SURGERY    ? Implantation of dual-chamber ICD.    ? St. Jude DDD-ICD 1/07  ? JOINT REPLACEMENT Right 08/16/2009  ? KNEE ARTHROPLASTY Left 2006  ? KNEE ARTHROPLASTY Right 12/10  ? x2  ? LAPAROSCOPIC APPENDECTOMY N/A 08/12/2018  ? Procedure: APPENDECTOMY LAPAROSCOPIC;  Surgeon: Henrene Dodge, MD;  Location: [facility] ORS;  Service: General;  Laterality: N/A;  ? TONSILLECTOMY    ? ? ? ?Family History  ?Problem Relation Age of Onset  ? Cancer Mother   ? Lung cancer Mother   ? Heart attack Father   ? ? ? ?Social History  ? ?Socioeconomic History  ? Marital status: Married  ?  Spouse name: Not on file  ? Number of children: Not on file  ? Years of education: Not on file  ? Highest education level: Not on file  ?Occupational History  ? Not on file  ?Tobacco Use  ? Smoking status: Former  ?  Packs/day: 1.00  ?  Years: 10.00  ?  Pack years: 10.00  ?  Types: Cigarettes  ?  Quit date: 12/31/1970  ?  Years since quitting: 50.8  ? Smokeless tobacco: Never  ?Vaping Use  ? Vaping Use: Never used  ?Substance and Sexual Activity  ? Alcohol use: Yes  ?  Alcohol/week: 0.0 standard drinks  ?  Comment: 2 DRINKS PER DAY, none this week (7/26)  ? Drug use: Never  ? Sexual activity: Not Currently  ?Other Topics Concern  ? Not on file  ?Social History Narrative  ? Not on file  ? ?Social Determinants of Health  ? ?Financial Resource Strain: Not on file  ?Food Insecurity: Not on file  ?Transportation Needs: Not on file  ?Physical Activity: Not on file  ?Stress: Not on file  ?Social Connections: Not on file  ?Intimate Partner Violence: Not on file  ? ? ? ?BP 128/72   Pulse 63   Ht 5\' 9"  (1.753 m)   Wt 168 lb 12.8 oz (76.6 kg)   SpO2 97%   BMI 24.93 kg/m?  ? ?Physical  Exam: ? ?Well appearing NAD ?HEENT: Unremarkable ?Neck:  No JVD, no thyromegally ?Lymphatics:  No adenopathy ?Back:  No CVA tenderness ?Lungs:  Clear with no wheezes ?HEART:  Regular rate rhythm, no murmurs, no rubs, no clicks ?Abd:  soft, positive bowel sounds, no organomegally, no rebound, no guarding ?Ext:  2 plus pulses, no edema, no cyanosis, no clubbing ?  Skin:  No rashes no nodules ?Neuro:  CN II through XII intact, motor grossly intact ? ?EKG - NSR ? ? ?Assess/Plan:  ?Chronic systolic heart failure - he has had a drop in his EF. That he has not had syncope and did not have any ventricular arrhythmias when he had an ICD in place is reassuring. Ultimately he is not interested in proceeding with another ICD implant.  ?PVC's - he will continue amiodarone. By symptoms his PVC's are much improved. ? ?Sharlot Gowda Yumna Ebers,MD ?

## 2021-10-21 NOTE — Patient Instructions (Addendum)
Medication Instructions:  ?Your physician recommends that you continue on your current medications as directed. Please refer to the Current Medication list given to you today. ? ?Labwork: ?None ordered. ? ?Testing/Procedures: ?None ordered. ? ?Follow-Up: ?Your physician wants you to follow-up as needed ? ? ?Any Other Special Instructions Will Be Listed Below (If Applicable). ? ?If you need a refill on your cardiac medications before your next appointment, please call your pharmacy.  ? ? ? ? ?

## 2021-12-22 ENCOUNTER — Encounter: Payer: Self-pay | Admitting: Cardiology

## 2021-12-23 MED ORDER — ATORVASTATIN CALCIUM 10 MG PO TABS: 10.0000 mg | ORAL_TABLET | Freq: Every day | ORAL | 1 refills | Status: DC

## 2022-01-17 ENCOUNTER — Ambulatory Visit
Admission: EM | Admit: 2022-01-17 | Discharge: 2022-01-17 | Disposition: A | Discharge status: Home / Self Care | Payer: Medicare Other | Attending: Emergency Medicine | Admitting: Emergency Medicine

## 2022-01-17 DIAGNOSIS — S80861A Insect bite (nonvenomous), right lower leg, initial encounter: Secondary | ICD-10-CM

## 2022-01-17 DIAGNOSIS — Z91018 Allergy to other foods: Secondary | ICD-10-CM

## 2022-01-17 DIAGNOSIS — R21 Rash and other nonspecific skin eruption: Secondary | ICD-10-CM | POA: Diagnosis not present

## 2022-01-17 DIAGNOSIS — W57XXXA Bitten or stung by nonvenomous insect and other nonvenomous arthropods, initial encounter: Secondary | ICD-10-CM | POA: Diagnosis not present

## 2022-01-17 MED ORDER — DOXYCYCLINE HYCLATE 100 MG PO CAPS: 100.0000 mg | ORAL_CAPSULE | Freq: Two times a day (BID) | ORAL | 0 refills | Status: AC

## 2022-01-17 NOTE — ED Triage Notes (Signed)
Patient presents to Urgent Care with complaints of right leg rash on upper thigh x 1 day. Pt states he is unsure if tick bite. He states he has concern since he has alfa allergy. Treating with zyrtec.

## 2022-01-17 NOTE — ED Provider Notes (Signed)
[name]    CSN: [id number] Arrival date & time: 01/17/22  0947      History   Chief Complaint Chief Complaint  Patient presents with   Rash    Right leg    HPI [name] is a 76 y.o. male.  Patient presents with a bull's-eye rash on his right thigh x 1 day.  He had a tick bite on his lower leg a few days ago.  He is concerned for possible Lyme disease. He lives in an area that has many ticks.  He has alfa-gal allergy.  He denies fever, chills, unusual arthralgias, headache, fatigue, chest pain, shortness of breath, or other symptoms.  No treatments at home.    The history is provided by the patient and medical records.   Past Medical History:  Diagnosis Date   Abnormal chest CT    Anaphylactic reaction    Anxiety    Asthma    Benign hypertension    Cardiomyopathy    Cardiomyopathy, idiopathic (HCC)    CHF (congestive heart failure) (HCC)    Chronic renal insufficiency    With creatinine 1.4   Drug-induced urticaria    FH: thoracic aortic aneurysm    GERD (gastroesophageal reflux disease)    History of colon polyps    Hives    Long Hx of hives   Hyperlipidemia    Hyperlipidemia    IMPLANTATION OF DEFIBRILLATOR, HX OF 02/15/2009   Qualifier: Diagnosis of  By: Stanford Breed, MD, Kandyce Rud    NSVT (nonsustained ventricular tachycardia) ([place])    Osteoarthritis    Osteoarthrosis, unspecified whether generalized or localized, lower leg    Osteoporosis    Oxygen deficiency    Psoriasis    PVC (premature ventricular contraction)    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    Patient Active Problem List   Diagnosis Date Noted   History of colonic polyps 09/24/2018   Acute appendicitis 08/12/2018   Thoracic Facet hypertrophy (Bilateral) 01/20/2018   Thoracic facet syndrome (Bilateral) 01/20/2018   DDD (degenerative disc disease), thoracic 01/20/2018   Thoracic radiculitis (Left) 01/20/2018   Chronic upper back pain (Left) 01/20/2018    Aortic atherosclerosis ([place]) 01/20/2018   Lumbar facet arthropathy 01/20/2018   Spondylosis without myelopathy or radiculopathy, thoracic region 01/20/2018   Spondylosis without myelopathy or radiculopathy, lumbosacral region 01/20/2018   History of rib fracture 01/20/2018   Diverticulosis, sigmoid 01/20/2018   Cholelithiasis 01/20/2018   Thoracic ascending aortic aneurysm (HCC) (4.2 cm) 01/20/2018   Hx of total knee replacement (Right) 01/20/2018   Chronic upper quadrant pain (LUQ) (Left) 01/20/2018   Neurogenic pain 01/20/2018   Chronic chest wall pain (Primary Area of Pain) (Left) 01/06/2018   Chronic thoracic back pain (Secondary Area of Pain) (Left) 01/06/2018   Positive Murphy's Sign 01/05/2018   Chronic pain syndrome 01/05/2018   Pharmacologic therapy 01/05/2018   Disorder of skeletal system 01/05/2018   Problems influencing health status 01/05/2018   B12 deficiency 10/06/2016   Medicare annual wellness visit, initial 10/06/2016   Abdominal pain (Left) 04/20/2015   CKD (chronic kidney disease) stage 3, GFR 30-59 ml/min ([place]) 09/18/2014   Asthma 01/05/2014   Bruit 08/29/2013   Automatic implantable cardioverter-defibrillator in situ [id number]   Chronic systolic heart failure ([place]) 09/09/2011   Benign essential hypertension 02/15/2009   Cardiomyopathy, nonischemic ([place]) 02/15/2009   LUNG NODULE 02/15/2009   DIZZINESS 02/15/2009   Nonspecific (abnormal) findings on radiological and other examination of  body structure 02/15/2009   Nonspecific abnormal findings on radiological and other examination of skull and head 02/15/2009   Combined hyperlipidemia 10/27/2008   RHEUMATIC FEVER 10/27/2008   Asymptomatic PVCs 10/27/2008   Other osteoporosis 10/27/2008    Past Surgical History:  Procedure Laterality Date   APPENDECTOMY     BIV ICD GENERTAOR CHANGE OUT N/A 10/08/2012   Procedure: BIV ICD GENERTAOR CHANGE OUT;  Surgeon: Evans Lance, MD;  Location: [facility] CATH LAB;  Service:  Cardiovascular;  Laterality: N/A;   CARDIAC CATHETERIZATION     CARDIAC DEFIBRILLATOR REMOVAL  2014   CATARACT EXTRACTION     CATARACT EXTRACTION W/PHACO Left 03/12/2016   Procedure: CATARACT EXTRACTION PHACO AND INTRAOCULAR LENS PLACEMENT ([place]);  Surgeon: Estill Cotta, MD;  Location: [facility] ORS;  Service: Ophthalmology;  Laterality: Left;  [place] 01:32 AP% 25.7 CDE 41.33 fluid pack lot # [id number]   COLONOSCOPY WITH PROPOFOL N/A 04/26/2019   Procedure: COLONOSCOPY WITH PROPOFOL;  Surgeon: Lollie Sails, MD;  Location: [facility] ENDOSCOPY;  Service: Endoscopy;  Laterality: N/A;   ESOPHAGOGASTRODUODENOSCOPY     EYE SURGERY     Implantation of dual-chamber ICD.     St. Jude DDD-ICD 1/07   JOINT REPLACEMENT Right 08/16/2009   KNEE ARTHROPLASTY Left 2006   KNEE ARTHROPLASTY Right 12/10   x2   LAPAROSCOPIC APPENDECTOMY N/A 08/12/2018   Procedure: APPENDECTOMY LAPAROSCOPIC;  Surgeon: Olean Ree, MD;  Location: [facility] ORS;  Service: General;  Laterality: N/A;   TONSILLECTOMY         Home Medications    Prior to Admission medications   Medication Sig Start Date End Date Taking? Authorizing Provider  doxycycline (VIBRAMYCIN) 100 MG capsule Take 1 capsule (100 mg total) by mouth 2 (two) times daily for 10 days. 01/17/22 01/27/22 Yes Sharion Balloon, NP  amiodarone (PACERONE) 200 MG tablet TAKE 1 TABLET BY MOUTH EVERY DAY 10/15/21   Lelon Perla, MD  atorvastatin (LIPITOR) 10 MG tablet Take 1 tablet (10 mg total) by mouth daily. 12/23/21   Lelon Perla, MD  carvedilol (COREG) 6.25 MG tablet Take 1 tablet (6.25 mg total) by mouth 2 (two) times daily. 04/08/21   Lelon Perla, MD  DENTA 5000 PLUS 1.1 % CREA dental cream See admin instructions. 07/18/18   [provider]  diphenhydrAMINE (BENADRYL) 25 mg capsule Take 25 mg by mouth every 6 (six) hours as needed.    [provider]  empagliflozin (JARDIANCE) 10 MG TABS tablet Take 1 tablet (10 mg total) by mouth daily before  breakfast. 04/08/21   Lelon Perla, MD  EPINEPHrine 0.3 mg/0.3 mL IJ SOAJ injection Inject 0.3 mg into the muscle as needed for anaphylaxis.    [provider]  famotidine (PEPCID) 40 MG tablet Take 40 mg by mouth 2 (two) times daily. 11/02/18   [provider]  gabapentin (NEURONTIN) 300 MG capsule Take 300 mg by mouth at bedtime.    [provider]  hydrALAZINE (APRESOLINE) 50 MG tablet Take 0.5 tablets (25 mg total) by mouth 2 (two) times daily. Take 1/2 tablet BID 05/27/21   Lelon Perla, MD  isosorbide mononitrate (IMDUR) 30 MG 24 hr tablet Take 1 tablet (30 mg total) by mouth daily. 03/21/13 10/10/21  Lelon Perla, MD  spironolactone (ALDACTONE) 25 MG tablet Take 0.5 tablets (12.5 mg total) by mouth daily. 04/08/21 10/10/21  Lelon Perla, MD    Family History Family History  Problem Relation Age of Onset  Cancer Mother    Lung cancer Mother    Heart attack Father     Social History Social History   Tobacco Use   Smoking status: Former    Packs/day: 1.00    Years: 10.00    Pack years: 10.00    Types: Cigarettes    Quit date: 12/31/1970    Years since quitting: 51.0   Smokeless tobacco: Never  Vaping Use   Vaping Use: Never used  Substance Use Topics   Alcohol use: Yes    Alcohol/week: 0.0 standard drinks    Comment: 2 DRINKS PER DAY, none this week (7/26)   Drug use: Never     Allergies   Beef allergy, Meat [alpha-gal], Pork-derived products, Ace inhibitors, Aspirin, Indocin [indomethacin], Lactase-lactobacillus, Lisinopril, Mobic [meloxicam], Naproxen, and Nsaids   Review of Systems Review of Systems  Constitutional:  Negative for chills and fever.  Respiratory:  Negative for cough and shortness of breath.   Cardiovascular:  Negative for chest pain and palpitations.  Musculoskeletal:  Negative for arthralgias and joint swelling.  Skin:  Positive for color change and rash.  Neurological:  Negative for dizziness and  headaches.  All other systems reviewed and are negative.   Physical Exam Triage Vital Signs ED Triage Vitals  Enc Vitals Group     BP      Pulse      Resp      Temp      Temp src      SpO2      Weight      Height      Head Circumference      Peak Flow      Pain Score      Pain Loc      Pain Edu?      Excl. in [place]?    No data found.  Updated Vital Signs BP 137/71 (BP Location: Left Arm)   Pulse (!) 58   Temp 98.5 F (36.9 C) (Oral)   Resp 16   SpO2 98%   Visual Acuity Right Eye Distance:   Left Eye Distance:   Bilateral Distance:    Right Eye Near:   Left Eye Near:    Bilateral Near:     Physical Exam Vitals and nursing note reviewed.  Constitutional:      General: He is not in acute distress.    Appearance: Normal appearance. He is well-developed. He is not ill-appearing.  HENT:     Head: Normocephalic and atraumatic.     Mouth/Throat:     Mouth: Mucous membranes are moist.  Cardiovascular:     Rate and Rhythm: Normal rate and regular rhythm.     Heart sounds: Normal heart sounds.  Pulmonary:     Effort: Pulmonary effort is normal. No respiratory distress.     Breath sounds: Normal breath sounds.  Musculoskeletal:        General: Normal range of motion.     Cervical back: Neck supple.  Skin:    General: Skin is warm and dry.     Findings: Erythema and rash present.     Comments: Erythematous circular bullseye rash on right inner thigh.  See picture.  Neurological:     General: No focal deficit present.     Mental Status: He is alert and oriented to person, place, and time.     Gait: Gait normal.  Psychiatric:        Mood and Affect: Mood normal.  Behavior: Behavior normal.      UC Treatments / Results  Labs (all labs ordered are listed, but only abnormal results are displayed) Labs Reviewed - No data to display  EKG   Radiology No results found.  Procedures Procedures (including critical care time)  Medications Ordered in  UC Medications - No data to display  Initial Impression / Assessment and Plan / UC Course  I have reviewed the triage vital signs and the nursing notes.  Pertinent labs & imaging results that were available during my care of the patient were reviewed by me and considered in my medical decision making (see chart for details).  Rash, tick bite, allergy to alpha-gal.  Treating with doxycycline.  Instructed patient to follow-up with his PCP on Monday.  Education provided on tick bites and Lyme disease.  Instructed patient to discuss with his PCP possible lab test for tick related illnesses.  Patient agrees to plan of care.   Final Clinical Impressions(s) / UC Diagnoses   Final diagnoses:  Rash  Tick bite of right lower leg, initial encounter  Allergy to alpha-gal     Discharge Instructions      Take the doxycycline as directed.  Follow up with your primary care provider on Monday.        ED Prescriptions     Medication Sig Dispense Auth. Provider   doxycycline (VIBRAMYCIN) 100 MG capsule Take 1 capsule (100 mg total) by mouth 2 (two) times daily for 10 days. 20 capsule Sharion Balloon, NP      PDMP not reviewed this encounter.   Sharion Balloon, NP 01/17/22 1024

## 2022-01-17 NOTE — Discharge Instructions (Addendum)
Take the doxycycline as directed.  Follow up with your primary care provider on Monday.    

## 2022-03-03 ENCOUNTER — Encounter: Payer: Self-pay | Admitting: Cardiology

## 2022-03-11 ENCOUNTER — Encounter: Payer: Self-pay | Admitting: *Deleted

## 2022-03-17 ENCOUNTER — Other Ambulatory Visit: Payer: Self-pay | Admitting: *Deleted

## 2022-03-27 ENCOUNTER — Other Ambulatory Visit: Payer: Self-pay | Admitting: Cardiology

## 2022-04-09 ENCOUNTER — Other Ambulatory Visit: Payer: Self-pay | Admitting: *Deleted

## 2022-04-09 DIAGNOSIS — I712 Thoracic aortic aneurysm, without rupture, unspecified: Secondary | ICD-10-CM

## 2022-04-12 ENCOUNTER — Other Ambulatory Visit: Payer: Self-pay | Admitting: Cardiology

## 2022-04-25 ENCOUNTER — Ambulatory Visit
Admission: RE | Admit: 2022-04-25 | Discharge: 2022-04-25 | Disposition: A | Discharge status: Home / Self Care | Payer: Medicare Other | Source: Ambulatory Visit | Attending: Cardiology | Admitting: Cardiology

## 2022-04-25 DIAGNOSIS — I712 Thoracic aortic aneurysm, without rupture, unspecified: Secondary | ICD-10-CM

## 2022-04-25 NOTE — Progress Notes (Signed)
HPI: FU nonischemic cardiomyopathy. Cardiac cath at [facility] in 2004 showed normal coronaries and EF 24; endocardial biopsy unrevealing. Note, his cardiomyopathy is felt possibly secondary to ventricular ectopy in the past and he was on amiodarone. Previous holter monitor in Dec 2012 showed frequent PVCs and nonsustained ventricular tachycardia. He also has had a previous ICD (generator removed in February of 2014 as his device had reached ERI and his LV function had improved). Abd ultrasound 1/15 showed no aneurysm. CTA September 2022 showed 41 mm ascending aortic aneurysm.  Echo repeated 9/22 and showed EF 25-30, moderate to severe LVE, grade 1 DD, moderate RVE, mild LAE, mild MR, mild to moderate TR, mild to moderate AI; mildly dilated ascending aorta (43 mm).  Nuclear study October 2022 showed ejection fraction 39%, prior inferior infarct but no ischemia.  CTA September 2023 showed 4.1 cm ascending thoracic aortic aneurysm, nodular density in the posterior right lung base and follow-up recommended 6 to 12 months.  Since last seen, the patient denies any dyspnea on exertion, orthopnea, PND, pedal edema, palpitations, syncope or chest pain.   Current Outpatient Medications  Medication Sig Dispense Refill   amiodarone (PACERONE) 200 MG tablet TAKE 1 TABLET BY MOUTH EVERY DAY 90 tablet 3   atorvastatin (LIPITOR) 10 MG tablet Take 1 tablet (10 mg total) by mouth daily. 90 tablet 1   carvedilol (COREG) 6.25 MG tablet TAKE 1 TABLET BY MOUTH TWICE A DAY 180 tablet 3   DENTA 5000 PLUS 1.1 % CREA dental cream See admin instructions.     diphenhydrAMINE (BENADRYL) 25 mg capsule Take 25 mg by mouth every 6 (six) hours as needed.     EPINEPHrine 0.3 mg/0.3 mL IJ SOAJ injection Inject 0.3 mg into the muscle as needed for anaphylaxis.     famotidine (PEPCID) 40 MG tablet Take 40 mg by mouth 2 (two) times daily.     gabapentin (NEURONTIN) 300 MG capsule Take 300 mg by mouth at bedtime.     hydrALAZINE  (APRESOLINE) 50 MG tablet Take 0.5 tablets (25 mg total) by mouth 2 (two) times daily. Take 1/2 tablet BID 180 tablet 1   spironolactone (ALDACTONE) 25 MG tablet TAKE 1/2 TABLET BY MOUTH EVERY DAY 45 tablet 3   isosorbide mononitrate (IMDUR) 30 MG 24 hr tablet Take 1 tablet (30 mg total) by mouth daily.     No current facility-administered medications for this visit.     Past Medical History:  Diagnosis Date   Abnormal chest CT    Anaphylactic reaction    Anxiety    Asthma    Benign hypertension    Cardiomyopathy    Cardiomyopathy, idiopathic (HCC)    CHF (congestive heart failure) (HCC)    Chronic renal insufficiency    With creatinine 1.4   Drug-induced urticaria    FH: thoracic aortic aneurysm    GERD (gastroesophageal reflux disease)    History of colon polyps    Hives    Long Hx of hives   Hyperlipidemia    Hyperlipidemia    IMPLANTATION OF DEFIBRILLATOR, HX OF 02/15/2009   Qualifier: Diagnosis of  By: Jens Som, MD, Lyn Hollingshead    NSVT (nonsustained ventricular tachycardia) (HCC)    Osteoarthritis    Osteoarthrosis, unspecified whether generalized or localized, lower leg    Osteoporosis    Oxygen deficiency    Psoriasis    PVC (premature ventricular contraction)    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  Past Surgical History:  Procedure Laterality Date   APPENDECTOMY     BIV ICD GENERTAOR CHANGE OUT N/A 10/08/2012   Procedure: BIV ICD GENERTAOR CHANGE OUT;  Surgeon: Evans Lance, MD;  Location: [facility] CATH LAB;  Service: Cardiovascular;  Laterality: N/A;   CARDIAC CATHETERIZATION     CARDIAC DEFIBRILLATOR REMOVAL  2014   CATARACT EXTRACTION     CATARACT EXTRACTION W/PHACO Left 03/12/2016   Procedure: CATARACT EXTRACTION PHACO AND INTRAOCULAR LENS PLACEMENT ([place]);  Surgeon: Estill Cotta, MD;  Location: [facility] ORS;  Service: Ophthalmology;  Laterality: Left;  [place] 01:32 AP% 25.7 CDE 41.33 fluid pack lot # [id number]   COLONOSCOPY WITH PROPOFOL N/A  04/26/2019   Procedure: COLONOSCOPY WITH PROPOFOL;  Surgeon: Lollie Sails, MD;  Location: [facility] ENDOSCOPY;  Service: Endoscopy;  Laterality: N/A;   ESOPHAGOGASTRODUODENOSCOPY     EYE SURGERY     Implantation of dual-chamber ICD.     St. Jude DDD-ICD 1/07   JOINT REPLACEMENT Right 08/16/2009   KNEE ARTHROPLASTY Left 2006   KNEE ARTHROPLASTY Right 12/10   x2   LAPAROSCOPIC APPENDECTOMY N/A 08/12/2018   Procedure: APPENDECTOMY LAPAROSCOPIC;  Surgeon: Olean Ree, MD;  Location: [facility] ORS;  Service: General;  Laterality: N/A;   TONSILLECTOMY      Social History   Socioeconomic History   Marital status: Married    Spouse name: Not on file   Number of children: Not on file   Years of education: Not on file   Highest education level: Not on file  Occupational History   Not on file  Tobacco Use   Smoking status: Former    Packs/day: 1.00    Years: 10.00    Total pack years: 10.00    Types: Cigarettes    Quit date: 12/31/1970    Years since quitting: 51.3   Smokeless tobacco: Never  Vaping Use   Vaping Use: Never used  Substance and Sexual Activity   Alcohol use: Yes    Alcohol/week: 0.0 standard drinks of alcohol    Comment: 2 DRINKS PER DAY, none this week (7/26)   Drug use: Never   Sexual activity: Not Currently  Other Topics Concern   Not on file  Social History Narrative   Not on file   Social Determinants of Health   Financial Resource Strain: Low Risk  (08/12/2018)   Overall Financial Resource Strain (CARDIA)    Difficulty of Paying Living Expenses: Not hard at all  Food Insecurity: Unknown (08/12/2018)   Hunger Vital Sign    Worried About Running Out of Food in the Last Year: Patient refused    [place] in the Last Year: Patient refused  Transportation Needs: Unknown (08/12/2018)   [place] - Transportation    Lack of Transportation (Medical): Patient refused    Lack of Transportation (Non-Medical): Patient refused  Physical Activity: Unknown  (08/12/2018)   Exercise Vital Sign    Days of Exercise per Week: Patient refused    Minutes of Exercise per Session: Patient refused  Stress: No Stress Concern Present (08/12/2018)   [place] of Stress : Not at all  Social Connections: Unknown (08/12/2018)   Social Connection and Isolation Panel [NHANES]    Frequency of Communication with Friends and Family: Patient refused    Frequency of Social Gatherings with Friends and Family: Patient refused    Attends Religious Services: Patient refused    Active Member of [employer]  or Organizations: Patient refused    Attends Banker Meetings: Patient refused    Marital Status: Patient refused  Intimate Partner Violence: Not At Risk (08/12/2018)   Humiliation, Afraid, Rape, and Kick questionnaire    Fear of Current or Ex-Partner: No    Emotionally Abused: No    Physically Abused: No    Sexually Abused: No    Family History  Problem Relation Age of Onset   Cancer Mother    Lung cancer Mother    Heart attack Father     ROS: no fevers or chills, productive cough, hemoptysis, dysphasia, odynophagia, melena, hematochezia, dysuria, hematuria, rash, seizure activity, orthopnea, PND, pedal edema, claudication. Remaining systems are negative.  Physical Exam: Well-developed well-nourished in no acute distress.  Skin is warm and dry.  HEENT is normal.  Neck is supple.  Chest is clear to auscultation with normal expansion.  Cardiovascular exam is regular rate and rhythm.  Abdominal exam nontender or distended. No masses palpated. Extremities show no edema. neuro grossly intact  ECG-normal sinus rhythm with first-degree AV block, normal axis, nonspecific ST changes.  Personally reviewed  A/P  1 nonischemic cardiomyopathy-patient has a history of allergy to ACE inhibitors.  Continue hydralazine/nitrates, carvedilol.  His LV function was worse on most  recent echocardiogram.  He did see Dr. Ladona Ridgel concerning revision of his ICD but together they decided against this.  2 PVCs-plan to continue amiodarone as ectopy was felt to be contributing to his LV dysfunction.  3 chronic systolic congestive heart failure-he remains euvolemic.  Continue spironolactone at present dose.  He cannot afford SGLT2 inhibitor.  4 history of dilated ascending aorta-plan follow-up CT in 6 months both for aorta and lung nodule.  5 hyperlipidemia-continue statin.  6 hypertension-blood pressure controlled.  Continue present medications.  Olga Millers, MD

## 2022-05-05 ENCOUNTER — Encounter: Payer: Self-pay | Admitting: Cardiology

## 2022-05-05 ENCOUNTER — Ambulatory Visit: Payer: Medicare Other | Attending: Cardiology | Admitting: Cardiology

## 2022-05-05 VITALS — BP 126/72 | HR 59 | Ht 69.0 in | Wt 176.0 lb

## 2022-05-05 DIAGNOSIS — I712 Thoracic aortic aneurysm, without rupture, unspecified: Secondary | ICD-10-CM

## 2022-05-05 DIAGNOSIS — E7800 Pure hypercholesterolemia, unspecified: Secondary | ICD-10-CM

## 2022-05-05 DIAGNOSIS — I428 Other cardiomyopathies: Secondary | ICD-10-CM | POA: Diagnosis not present

## 2022-05-05 DIAGNOSIS — I10 Essential (primary) hypertension: Secondary | ICD-10-CM | POA: Diagnosis not present

## 2022-05-05 DIAGNOSIS — I5022 Chronic systolic (congestive) heart failure: Secondary | ICD-10-CM

## 2022-05-05 NOTE — Patient Instructions (Signed)
  Follow-Up: At [facility], you and your health needs are our priority.  As part of our continuing mission to provide you with exceptional heart care, we have created designated Provider Care Teams.  These Care Teams include your primary Cardiologist (physician) and Advanced Practice Providers (APPs -  Physician Assistants and Nurse Practitioners) who all work together to provide you with the care you need, when you need it.  We recommend signing up for the patient portal called "MyChart".  Sign up information is provided on this After Visit Summary.  MyChart is used to connect with patients for Virtual Visits (Telemedicine).  Patients are able to view lab/test results, encounter notes, upcoming appointments, etc.  Non-urgent messages can be sent to your provider as well.   To learn more about what you can do with MyChart, go to [email or web address].    Your next appointment:   6 month(s)  The format for your next appointment:   In Person  Provider:   Brian Crenshaw, MD    

## 2022-10-15 ENCOUNTER — Other Ambulatory Visit: Payer: Self-pay | Admitting: *Deleted

## 2022-10-15 DIAGNOSIS — R918 Other nonspecific abnormal finding of lung field: Secondary | ICD-10-CM

## 2022-10-15 NOTE — Progress Notes (Signed)
CT

## 2022-10-27 ENCOUNTER — Ambulatory Visit
Admission: RE | Admit: 2022-10-27 | Discharge: 2022-10-27 | Disposition: A | Discharge status: Home / Self Care | Payer: Medicare Other | Source: Ambulatory Visit | Attending: Cardiology | Admitting: Cardiology

## 2022-10-27 DIAGNOSIS — R918 Other nonspecific abnormal finding of lung field: Secondary | ICD-10-CM

## 2022-10-28 NOTE — Progress Notes (Signed)
HPI: FU nonischemic cardiomyopathy. Cardiac cath at [facility] in 2004 showed normal coronaries and EF 24; endocardial biopsy unrevealing. Note, his cardiomyopathy is felt possibly secondary to ventricular ectopy in the past and he was on amiodarone. Previous holter monitor in Dec 2012 showed frequent PVCs and nonsustained ventricular tachycardia. He also has had a previous ICD (generator removed in February of 2014 as his device had reached ERI and his LV function had improved). Abd ultrasound 1/15 showed no aneurysm. CTA September 2022 showed 41 mm ascending aortic aneurysm.  Echo repeated 9/22 and showed EF 25-30, moderate to severe LVE, grade 1 DD, moderate RVE, mild LAE, mild MR, mild to moderate TR, mild to moderate AI; mildly dilated ascending aorta (43 mm).  Nuclear study October 2022 showed ejection fraction 39%, prior inferior infarct but no ischemia.  Chest CT March 2024 showed 8 mm right lower lobe nodule and follow-up recommended 12 months, 4.1 cm ascending thoracic aortic aneurysm.  Since last seen, he denies dyspnea, chest pain, palpitations, syncope or pedal edema.  Current Outpatient Medications  Medication Sig Dispense Refill   amiodarone (PACERONE) 200 MG tablet TAKE 1 TABLET BY MOUTH EVERY DAY 90 tablet 3   atorvastatin (LIPITOR) 10 MG tablet Take 1 tablet (10 mg total) by mouth daily. 90 tablet 1   carvedilol (COREG) 6.25 MG tablet TAKE 1 TABLET BY MOUTH TWICE A DAY 180 tablet 3   cetirizine (ZYRTEC) 10 MG tablet Take 10 mg by mouth daily.     DENTA 5000 PLUS 1.1 % CREA dental cream See admin instructions.     diphenhydrAMINE (BENADRYL) 25 mg capsule Take 25 mg by mouth every 6 (six) hours as needed.     EPINEPHrine 0.3 mg/0.3 mL IJ SOAJ injection Inject 0.3 mg into the muscle as needed for anaphylaxis.     famotidine (PEPCID) 40 MG tablet Take 40 mg by mouth 2 (two) times daily.     hydrALAZINE (APRESOLINE) 50 MG tablet Take 0.5 tablets (25 mg total) by mouth 2 (two) times daily.  Take 1/2 tablet BID 180 tablet 1   isosorbide mononitrate (IMDUR) 30 MG 24 hr tablet Take 1 tablet (30 mg total) by mouth daily.     spironolactone (ALDACTONE) 25 MG tablet TAKE 1/2 TABLET BY MOUTH EVERY DAY 45 tablet 3   No current facility-administered medications for this visit.     Past Medical History:  Diagnosis Date   Abnormal chest CT    Anaphylactic reaction    Anxiety    Asthma    Benign hypertension    Cardiomyopathy    Cardiomyopathy, idiopathic (HCC)    CHF (congestive heart failure) (HCC)    Chronic renal insufficiency    With creatinine 1.4   Drug-induced urticaria    FH: thoracic aortic aneurysm    GERD (gastroesophageal reflux disease)    History of colon polyps    Hives    Long Hx of hives   Hyperlipidemia    Hyperlipidemia    IMPLANTATION OF DEFIBRILLATOR, HX OF 02/15/2009   Qualifier: Diagnosis of  By: Stanford Breed, MD, Kandyce Rud    NSVT (nonsustained ventricular tachycardia) ([place])    Osteoarthritis    Osteoarthrosis, unspecified whether generalized or localized, lower leg    Osteoporosis    Oxygen deficiency    Psoriasis    PVC (premature ventricular contraction)    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    Past Surgical History:  Procedure Laterality Date   APPENDECTOMY  BIV ICD GENERTAOR CHANGE OUT N/A 10/08/2012   Procedure: BIV ICD GENERTAOR CHANGE OUT;  Surgeon: Evans Lance, MD;  Location: [facility] CATH LAB;  Service: Cardiovascular;  Laterality: N/A;   CARDIAC CATHETERIZATION     CARDIAC DEFIBRILLATOR REMOVAL  2014   CATARACT EXTRACTION     CATARACT EXTRACTION W/PHACO Left 03/12/2016   Procedure: CATARACT EXTRACTION PHACO AND INTRAOCULAR LENS PLACEMENT ([place]);  Surgeon: Estill Cotta, MD;  Location: [facility] ORS;  Service: Ophthalmology;  Laterality: Left;  [place] 01:32 AP% 25.7 CDE 41.33 fluid pack lot # [id number]   COLONOSCOPY WITH PROPOFOL N/A 04/26/2019   Procedure: COLONOSCOPY WITH PROPOFOL;  Surgeon: Lollie Sails, MD;   Location: [facility] ENDOSCOPY;  Service: Endoscopy;  Laterality: N/A;   ESOPHAGOGASTRODUODENOSCOPY     EYE SURGERY     Implantation of dual-chamber ICD.     St. Jude DDD-ICD 1/07   JOINT REPLACEMENT Right 08/16/2009   KNEE ARTHROPLASTY Left 2006   KNEE ARTHROPLASTY Right 12/10   x2   LAPAROSCOPIC APPENDECTOMY N/A 08/12/2018   Procedure: APPENDECTOMY LAPAROSCOPIC;  Surgeon: Olean Ree, MD;  Location: [facility] ORS;  Service: General;  Laterality: N/A;   TONSILLECTOMY      Social History   Socioeconomic History   Marital status: Married    Spouse name: Not on file   Number of children: Not on file   Years of education: Not on file   Highest education level: Not on file  Occupational History   Not on file  Tobacco Use   Smoking status: Former    Packs/day: 1.00    Years: 10.00    Additional pack years: 0.00    Total pack years: 10.00    Types: Cigarettes    Quit date: 12/31/1970    Years since quitting: 51.8   Smokeless tobacco: Never  Vaping Use   Vaping Use: Never used  Substance and Sexual Activity   Alcohol use: Yes    Alcohol/week: 0.0 standard drinks of alcohol    Comment: 2 DRINKS PER DAY, none this week (7/26)   Drug use: Never   Sexual activity: Not Currently  Other Topics Concern   Not on file  Social History Narrative   Not on file   Social Determinants of Health   Financial Resource Strain: Low Risk  (08/12/2018)   Overall Financial Resource Strain (CARDIA)    Difficulty of Paying Living Expenses: Not hard at all  Food Insecurity: Unknown (08/12/2018)   Hunger Vital Sign    Worried About Running Out of Food in the Last Year: Patient declined    [place] in the Last Year: Patient declined  Transportation Needs: Unknown (08/12/2018)   [place] - Hydrologist (Medical): Patient declined    Lack of Transportation (Non-Medical): Patient declined  Physical Activity: Unknown (08/12/2018)   Exercise Vital Sign    Days of  Exercise per Week: Patient declined    Minutes of Exercise per Session: Patient declined  Stress: No Stress Concern Present (08/12/2018)   [place] of Stress : Not at all  Social Connections: Unknown (08/12/2018)   Social Connection and Isolation Panel [NHANES]    Frequency of Communication with Friends and Family: Patient declined    Frequency of Social Gatherings with Friends and Family: Patient declined    Attends Religious Services: Patient declined    Marine scientist or Organizations: Patient declined  Attends Archivist Meetings: Patient declined    Marital Status: Patient declined  Intimate Partner Violence: Not At Risk (08/12/2018)   Humiliation, Afraid, Rape, and Kick questionnaire    Fear of Current or Ex-Partner: No    Emotionally Abused: No    Physically Abused: No    Sexually Abused: No    Family History  Problem Relation Age of Onset   Cancer Mother    Lung cancer Mother    Heart attack Father     ROS: no fevers or chills, productive cough, hemoptysis, dysphasia, odynophagia, melena, hematochezia, dysuria, hematuria, rash, seizure activity, orthopnea, PND, pedal edema, claudication. Remaining systems are negative.  Physical Exam: Well-developed well-nourished in no acute distress.  Skin is warm and dry.  HEENT is normal.  Neck is supple.  Chest is clear to auscultation with normal expansion.  Cardiovascular exam is regular rate and rhythm.  Abdominal exam nontender or distended. No masses palpated. Extremities show no edema. neuro grossly intact  ECG-normal sinus rhythm at a rate of 61, first-degree AV block, nonspecific ST changes.  Personally reviewed  A/P  1 nonischemic cardiomyopathy-continue hydralazine/nitrates (patient has an allergy to ACE inhibitors) and carvedilol.  Will repeat echocardiogram.  He was seen by Dr. Lovena Le previously concerning revision  of his ICD but the decision was made to not pursue this.  2 history of PVCs-continue amiodarone.  Patient's ectopy was felt to be potentially contributing to his cardiomyopathy previously.  Recent TSH, liver functions normal.  Chest CT without amiodarone toxicity.  3 chronic systolic congestive heart failure-patient is euvolemic on exam.  Continue spironolactone.  He declines SGLT2 inhibitor due to expense.  4 Thoracic aortic aneurysm-patient will have follow-up CT March 2025.  5 lung nodule-plan follow-up CT March 2025.  6 hypertension-blood pressure controlled.  Continue present medical regimen.  7 hyperlipidemia-continue statin.  Kirk Ruths, MD

## 2022-11-04 ENCOUNTER — Ambulatory Visit: Payer: Medicare Other | Attending: Cardiology | Admitting: Cardiology

## 2022-11-04 ENCOUNTER — Encounter: Payer: Self-pay | Admitting: Cardiology

## 2022-11-04 VITALS — BP 126/60 | HR 61 | Ht 69.0 in | Wt 171.8 lb

## 2022-11-04 DIAGNOSIS — I428 Other cardiomyopathies: Secondary | ICD-10-CM | POA: Diagnosis not present

## 2022-11-04 MED ORDER — HYDRALAZINE HCL 50 MG PO TABS: 25.0000 mg | ORAL_TABLET | Freq: Two times a day (BID) | ORAL | 3 refills | Status: DC

## 2022-11-04 NOTE — Addendum Note (Signed)
Addended by: Cristopher Estimable on: 11/04/2022 08:17 AM   Modules accepted: Orders

## 2022-11-04 NOTE — Patient Instructions (Signed)
  Testing/Procedures:  Your physician has requested that you have an echocardiogram. Echocardiography is a painless test that uses sound waves to create images of your heart. It provides your doctor with information about the size and shape of your heart and how well your heart's chambers and valves are working. This procedure takes approximately one hour. There are no restrictions for this procedure. Please do NOT wear cologne, perfume, aftershave, or lotions (deodorant is allowed). Please arrive 15 minutes prior to your appointment time. [address]   Follow-Up: At [facility], you and your health needs are our priority.  As part of our continuing mission to provide you with exceptional heart care, we have created designated Provider Care Teams.  These Care Teams include your primary Cardiologist (physician) and Advanced Practice Providers (APPs -  Physician Assistants and Nurse Practitioners) who all work together to provide you with the care you need, when you need it.  We recommend signing up for the patient portal called "MyChart".  Sign up information is provided on this After Visit Summary.  MyChart is used to connect with patients for Virtual Visits (Telemedicine).  Patients are able to view lab/test results, encounter notes, upcoming appointments, etc.  Non-urgent messages can be sent to your provider as well.   To learn more about what you can do with MyChart, go to [email or web address].    Your next appointment:   6 month(s)  Provider:   Brian Crenshaw, MD      

## 2022-11-11 ENCOUNTER — Ambulatory Visit: Payer: Medicare Other | Attending: Cardiology

## 2022-11-11 DIAGNOSIS — I428 Other cardiomyopathies: Secondary | ICD-10-CM | POA: Diagnosis not present

## 2022-11-12 ENCOUNTER — Other Ambulatory Visit: Payer: Self-pay | Admitting: *Deleted

## 2022-11-12 DIAGNOSIS — I428 Other cardiomyopathies: Secondary | ICD-10-CM

## 2022-11-12 LAB — ECHOCARDIOGRAM COMPLETE
AR max vel: 2.27 cm2
AV Area VTI: 2.23 cm2
AV Area mean vel: 2.23 cm2
AV Mean grad: 7 mmHg
AV Peak grad: 13.3 mmHg
AV Vena cont: 0.4 cm
Ao pk vel: 1.83 m/s
Area-P 1/2: 5.68 cm2
Calc EF: 30.4 %
P 1/2 time: 388 msec
S' Lateral: 6 cm
Single Plane A2C EF: 32.4 %
Single Plane A4C EF: 28.8 %

## 2023-02-15 ENCOUNTER — Ambulatory Visit
Admission: EM | Admit: 2023-02-15 | Discharge: 2023-02-15 | Disposition: A | Discharge status: Home / Self Care | Payer: Medicare Other | Attending: Emergency Medicine | Admitting: Emergency Medicine

## 2023-02-15 DIAGNOSIS — S61411A Laceration without foreign body of right hand, initial encounter: Secondary | ICD-10-CM | POA: Diagnosis not present

## 2023-02-15 DIAGNOSIS — L03113 Cellulitis of right upper limb: Secondary | ICD-10-CM | POA: Diagnosis not present

## 2023-02-15 MED ORDER — DOXYCYCLINE HYCLATE 100 MG PO CAPS
100.0000 mg | ORAL_CAPSULE | Freq: Two times a day (BID) | ORAL | 0 refills | Status: AC
Start: 2023-02-15 — End: 2023-02-22

## 2023-02-15 NOTE — ED Provider Notes (Signed)
[name]    CSN: [id number] Arrival date & time: 02/15/23  0801      History   Chief Complaint Chief Complaint  Patient presents with   Hand Problem    HPI [name] is a 77 y.o. male.  Patient presents with painful redness and swelling of his right hand around a laceration that occurred 2-3 days ago.  He has been treating it with Neosporin.  He denies wound drainage, fever, chills, or other symptoms.  Last tetanus 2021.  His medical history includes hypertension, heart failure, CKD.  The history is provided by the patient and medical records.    Past Medical History:  Diagnosis Date   Abnormal chest CT    Anaphylactic reaction    Anxiety    Asthma    Benign hypertension    Cardiomyopathy    Cardiomyopathy, idiopathic (HCC)    CHF (congestive heart failure) (HCC)    Chronic renal insufficiency    With creatinine 1.4   Drug-induced urticaria    FH: thoracic aortic aneurysm    GERD (gastroesophageal reflux disease)    History of colon polyps    Hives    Long Hx of hives   Hyperlipidemia    Hyperlipidemia    IMPLANTATION OF DEFIBRILLATOR, HX OF 02/15/2009   Qualifier: Diagnosis of  By: Jens Som, MD, Lyn Hollingshead    NSVT (nonsustained ventricular tachycardia) (HCC)    Osteoarthritis    Osteoarthrosis, unspecified whether generalized or localized, lower leg    Osteoporosis    Oxygen deficiency    Psoriasis    PVC (premature ventricular contraction)    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    Patient Active Problem List   Diagnosis Date Noted   History of colonic polyps 09/24/2018   Acute appendicitis 08/12/2018   Thoracic Facet hypertrophy (Bilateral) 01/20/2018   Thoracic facet syndrome (Bilateral) 01/20/2018   DDD (degenerative disc disease), 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/20/2018   Thoracic radiculitis (Left) 01/20/2018   Chronic upper back pain (Left) 01/20/2018   Aortic atherosclerosis (HCC) 01/20/2018   Lumbar facet arthropathy 01/20/2018    Spondylosis without myelopathy or radiculopathy, thoracic region 01/20/2018   Spondylosis without myelopathy or radiculopathy, lumbosacral region 01/20/2018   History of rib fracture 01/20/2018   Diverticulosis, sigmoid 01/20/2018   Cholelithiasis 01/20/2018   Thoracic ascending aortic aneurysm (HCC) (4.2 cm) 01/20/2018   Hx of total knee replacement (Right) 01/20/2018   Chronic upper quadrant pain (LUQ) (Left) 01/20/2018   Neurogenic pain 01/20/2018   Chronic chest wall pain (Primary Area of Pain) (Left) 01/06/2018   Chronic thoracic back pain (Secondary Area of Pain) (Left) 01/06/2018   Positive Murphy's Sign 01/05/2018   Chronic pain syndrome 01/05/2018   Pharmacologic therapy 01/05/2018   Disorder of skeletal system 01/05/2018   Problems influencing health status 01/05/2018   B12 deficiency 10/06/2016   Medicare annual wellness visit, initial 10/06/2016   Abdominal pain (Left) 04/20/2015   CKD (chronic kidney disease) stage 3, GFR 30-59 ml/min (HCC) 09/18/2014   Asthma 01/05/2014   Bruit 08/29/2013   Automatic implantable cardioverter-defibrillator in situ 03/25/2012   Chronic systolic heart failure (HCC) 09/09/2011   Benign essential hypertension 02/15/2009   Cardiomyopathy, nonischemic (HCC) 02/15/2009   LUNG NODULE 02/15/2009   DIZZINESS 02/15/2009   Nonspecific (abnormal) findings on radiological and other examination of body structure 02/15/2009   Nonspecific abnormal findings on radiological and other examination of skull and head 02/15/2009   Combined hyperlipidemia 10/27/2008   RHEUMATIC  FEVER 10/27/2008   Asymptomatic PVCs 10/27/2008   Other osteoporosis 10/27/2008    Past Surgical History:  Procedure Laterality Date   APPENDECTOMY     BIV ICD GENERTAOR CHANGE OUT N/A 10/08/2012   Procedure: BIV ICD GENERTAOR CHANGE OUT;  Surgeon: Marinus Maw, MD;  Location: [facility] CATH LAB;  Service: Cardiovascular;  Laterality: N/A;   CARDIAC CATHETERIZATION     CARDIAC  DEFIBRILLATOR REMOVAL  2014   CATARACT EXTRACTION     CATARACT EXTRACTION W/PHACO Left 03/12/2016   Procedure: CATARACT EXTRACTION PHACO AND INTRAOCULAR LENS PLACEMENT (I[facility]);  Surgeon: Sallee Lange, MD;  Location: [facility] ORS;  Service: Ophthalmology;  Laterality: Left;  [place] 01:32 AP% 25.7 CDE 41.33 fluid pack lot # [id number]   COLONOSCOPY WITH PROPOFOL N/A 04/26/2019   Procedure: COLONOSCOPY WITH PROPOFOL;  Surgeon: Christena Deem, MD;  Location: [facility] ENDOSCOPY;  Service: Endoscopy;  Laterality: N/A;   ESOPHAGOGASTRODUODENOSCOPY     EYE SURGERY     Implantation of dual-chamber ICD.     St. Jude DDD-ICD 1/07   JOINT REPLACEMENT Right 08/16/2009   KNEE ARTHROPLASTY Left 2006   KNEE ARTHROPLASTY Right 12/10   x2   LAPAROSCOPIC APPENDECTOMY N/A 08/12/2018   Procedure: APPENDECTOMY LAPAROSCOPIC;  Surgeon: Henrene Dodge, MD;  Location: [facility] ORS;  Service: General;  Laterality: N/A;   TONSILLECTOMY         Home Medications    Prior to Admission medications   Medication Sig Start Date End Date Taking? Authorizing Provider  doxycycline (VIBRAMYCIN) 100 MG capsule Take 1 capsule (100 mg total) by mouth 2 (two) times daily for 7 days. 02/15/23 02/22/23 Yes Mickie Bail, NP  amiodarone (PACERONE) 200 MG tablet TAKE 1 TABLET BY MOUTH EVERY DAY 04/14/22   Lewayne Bunting, MD  atorvastatin (LIPITOR) 10 MG tablet Take 1 tablet (10 mg total) by mouth daily. 12/23/21   Lewayne Bunting, MD  carvedilol (COREG) 6.25 MG tablet TAKE 1 TABLET BY MOUTH TWICE A DAY 03/28/22   Lewayne Bunting, MD  cetirizine (ZYRTEC) 10 MG tablet Take 10 mg by mouth daily.    [provider]  DENTA 5000 PLUS 1.1 % CREA dental cream See admin instructions. 07/18/18   [provider]  diphenhydrAMINE (BENADRYL) 25 mg capsule Take 25 mg by mouth every 6 (six) hours as needed.    [provider]  EPINEPHrine 0.3 mg/0.3 mL IJ SOAJ injection Inject 0.3 mg into the muscle as needed for anaphylaxis.     [provider]  famotidine (PEPCID) 40 MG tablet Take 40 mg by mouth 2 (two) times daily. 11/02/18   [provider]  hydrALAZINE (APRESOLINE) 50 MG tablet Take 0.5 tablets (25 mg total) by mouth 2 (two) times daily. Take 1/2 tablet BID 11/04/22   Lewayne Bunting, MD  isosorbide mononitrate (IMDUR) 30 MG 24 hr tablet Take 1 tablet (30 mg total) by mouth daily. 03/21/13 11/04/22  Lewayne Bunting, MD  spironolactone (ALDACTONE) 25 MG tablet TAKE 1/2 TABLET BY MOUTH EVERY DAY 03/28/22   Lewayne Bunting, MD    Family History Family History  Problem Relation Age of Onset   Cancer Mother    Lung cancer Mother    Heart attack Father     Social History Social History   Tobacco Use   Smoking status: Former    Packs/day: 1.00    Years: 10.00    Additional pack years: 0.00    Total pack years: 10.00  Types: Cigarettes    Quit date: 12/31/1970    Years since quitting: 52.1   Smokeless tobacco: Never  Vaping Use   Vaping Use: Never used  Substance Use Topics   Alcohol use: Yes    Alcohol/week: 0.0 standard drinks of alcohol    Comment: 2 DRINKS PER DAY, none this week (7/26)   Drug use: Never     Allergies   Beef allergy, Meat [alpha-gal], Pork-derived products, Ace inhibitors, Aspirin, Indocin [indomethacin], Lactase-lactobacillus, Lisinopril, Mobic [meloxicam], Naproxen, and Nsaids   Review of Systems Review of Systems  Constitutional:  Negative for chills and fever.  Musculoskeletal:  Positive for arthralgias and joint swelling.  Skin:  Positive for color change and wound.  Neurological:  Negative for weakness and numbness.     Physical Exam Triage Vital Signs ED Triage Vitals  Enc Vitals Group     BP      Pulse      Resp      Temp      Temp src      SpO2      Weight      Height      Head Circumference      Peak Flow      Pain Score      Pain Loc      Pain Edu?      Excl. in GC?    No data found.  Updated Vital Signs BP 125/77    Pulse 86   Temp 98.6 F (37 C)   Resp 18   SpO2 93%   Visual Acuity Right Eye Distance:   Left Eye Distance:   Bilateral Distance:    Right Eye Near:   Left Eye Near:    Bilateral Near:     Physical Exam Constitutional:      General: He is not in acute distress. HENT:     Mouth/Throat:     Mouth: Mucous membranes are moist.  Cardiovascular:     Rate and Rhythm: Normal rate and regular rhythm.  Pulmonary:     Effort: Pulmonary effort is normal. No respiratory distress.  Musculoskeletal:        General: Swelling and tenderness present. No deformity. Normal range of motion.  Skin:    General: Skin is warm and dry.     Findings: Erythema and lesion present.     Comments: Laceration on dorsum of right hand with surrounding erythema and edema.   Neurological:     Mental Status: He is alert.     Sensory: No sensory deficit.     Motor: No weakness.  Psychiatric:        Mood and Affect: Mood normal.        Behavior: Behavior normal.      UC Treatments / Results  Labs (all labs ordered are listed, but only abnormal results are displayed) Labs Reviewed - No data to display  EKG   Radiology No results found.  Procedures Procedures (including critical care time)  Medications Ordered in UC Medications - No data to display  Initial Impression / Assessment and Plan / UC Course  I have reviewed the triage vital signs and the nursing notes.  Pertinent labs & imaging results that were available during my care of the patient were reviewed by me and considered in my medical decision making (see chart for details).    Cellulitis and laceration of right hand.  The wound is 45-12 days old.  Redness and swelling are worse  today.  No fever or purulent drainage.  Tetanus up-to-date.  Treating with doxycycline.  Wound care instructions given.  Instructed patient to follow up with his PCP in 1-2 days for recheck.  ED precautions given.  Education provided on cellulitis and wound  care.  He agrees to plan of care.    Final Clinical Impressions(s) / UC Diagnoses   Final diagnoses:  Cellulitis of hand, right  Laceration of right hand without foreign body, initial encounter     Discharge Instructions      Take the doxycycline as directed.  Follow up with your primary care provider tomorrow for recheck.  Go to the emergency department if you have worsening symptoms.      ED Prescriptions     Medication Sig Dispense Auth. Provider   doxycycline (VIBRAMYCIN) 100 MG capsule Take 1 capsule (100 mg total) by mouth 2 (two) times daily for 7 days. 14 capsule Mickie Bail, NP      PDMP not reviewed this encounter.   Mickie Bail, NP 02/15/23 [phone]

## 2023-02-15 NOTE — Discharge Instructions (Addendum)
Take the doxycycline as directed.  Follow up with your primary care provider tomorrow for recheck.  Go to the emergency department if you have worsening symptoms.

## 2023-02-15 NOTE — ED Triage Notes (Signed)
Patient to Urgent Care with complaints of right sided hand pain and swelling. Redness and warmth present.  Reports cutting his hand two-three days ago while gardening. Reports shooting pains when he moving his wrist/ gripping. States he cleaned his hand immediately and has been applying neosporin.

## 2023-03-08 ENCOUNTER — Emergency Department: Payer: Medicare Other

## 2023-03-08 ENCOUNTER — Encounter: Payer: Self-pay | Admitting: Cardiology

## 2023-03-08 ENCOUNTER — Emergency Department
Admission: EM | Admit: 2023-03-08 | Discharge: 2023-03-08 | Disposition: A | Discharge status: Home / Self Care | Payer: Medicare Other | Source: Home / Self Care | Attending: Emergency Medicine | Admitting: Emergency Medicine

## 2023-03-08 ENCOUNTER — Other Ambulatory Visit: Payer: Self-pay

## 2023-03-08 DIAGNOSIS — I509 Heart failure, unspecified: Secondary | ICD-10-CM | POA: Insufficient documentation

## 2023-03-08 DIAGNOSIS — R0789 Other chest pain: Secondary | ICD-10-CM | POA: Diagnosis present

## 2023-03-08 DIAGNOSIS — R079 Chest pain, unspecified: Secondary | ICD-10-CM

## 2023-03-08 DIAGNOSIS — Z950 Presence of cardiac pacemaker: Secondary | ICD-10-CM | POA: Diagnosis not present

## 2023-03-08 DIAGNOSIS — I4891 Unspecified atrial fibrillation: Secondary | ICD-10-CM | POA: Diagnosis not present

## 2023-03-08 DIAGNOSIS — I110 Hypertensive heart disease with heart failure: Secondary | ICD-10-CM | POA: Insufficient documentation

## 2023-03-08 LAB — BASIC METABOLIC PANEL WITH GFR
Anion gap: 9 (ref 5–15)
BUN: 19 mg/dL (ref 8–23)
CO2: 23 mmol/L (ref 22–32)
Calcium: 8.8 mg/dL — ABNORMAL LOW (ref 8.9–10.3)
Chloride: 106 mmol/L (ref 98–111)
Creatinine, Ser: 1.6 mg/dL — ABNORMAL HIGH (ref 0.61–1.24)
GFR, Estimated: 45 mL/min — ABNORMAL LOW
Glucose, Bld: 107 mg/dL — ABNORMAL HIGH (ref 70–99)
Potassium: 4.2 mmol/L (ref 3.5–5.1)
Sodium: 138 mmol/L (ref 135–145)

## 2023-03-08 LAB — CBC
HCT: 43.9 % (ref 39.0–52.0)
Hemoglobin: 14.6 g/dL (ref 13.0–17.0)
MCH: 32.1 pg (ref 26.0–34.0)
MCHC: 33.3 g/dL (ref 30.0–36.0)
MCV: 96.5 fL (ref 80.0–100.0)
Platelets: 265 10*3/uL (ref 150–400)
RBC: 4.55 MIL/uL (ref 4.22–5.81)
RDW: 13.8 % (ref 11.5–15.5)
WBC: 8.6 10*3/uL (ref 4.0–10.5)
nRBC: 0 % (ref 0.0–0.2)

## 2023-03-08 LAB — TROPONIN I (HIGH SENSITIVITY)
Troponin I (High Sensitivity): 23 ng/L — ABNORMAL HIGH (ref <18)
Troponin I (High Sensitivity): 19 ng/L — ABNORMAL HIGH (ref <18)

## 2023-03-08 LAB — MAGNESIUM: Magnesium: 2.2 mg/dL (ref 1.7–2.4)

## 2023-03-08 MED ORDER — SUCRALFATE 1 G PO TABS
1.0000 g | ORAL_TABLET | Freq: Once | ORAL | Status: AC
  Administered 2023-03-08: 1 g via ORAL
  Filled 2023-03-08: qty 1

## 2023-03-08 MED ORDER — FAMOTIDINE 20 MG PO TABS
40.0000 mg | ORAL_TABLET | Freq: Once | ORAL | Status: AC
  Administered 2023-03-08: 40 mg via ORAL
  Filled 2023-03-08: qty 2

## 2023-03-08 MED ORDER — METOCLOPRAMIDE HCL 10 MG PO TABS
10.0000 mg | ORAL_TABLET | Freq: Once | ORAL | Status: AC
  Administered 2023-03-08: 10 mg via ORAL
  Filled 2023-03-08: qty 1

## 2023-03-08 NOTE — Discharge Instructions (Addendum)
Fortunately your testing in the emergency department did not show signs of a heart attack.  Please follow-up with your cardiologist this week.

## 2023-03-08 NOTE — ED Notes (Signed)
Pt in bed, pt denies pain, pt states that he is ready to go home, pt verbalized understanding d/c and follow up, pt from department

## 2023-03-08 NOTE — ED Provider Notes (Signed)
[facility] Provider Note    Event Date/Time   First MD Initiated Contact with Patient 03/08/23 0430     (approximate)   History   Chief Complaint: Chest Pain   HPI  [name] is a 77 y.o. male with a history of chronic pain, CHF, GERD, pacemaker hypertension who comes ED complaining of central chest pain described as pressure which started at 2 AM this morning.  Nonradiating.  No shortness of breath diaphoresis or vomiting.  No recent exertional symptoms.  Not pleuritic.  Patient reports that he is very active and does not get chest pains when doing strenuous activity.  He has been compliant with all of his medications.  Pain intensity is 2/10.     Physical Exam   Triage Vital Signs: ED Triage Vitals  Encounter Vitals Group     BP 03/08/23 0422 136/84     Systolic BP Percentile --      Diastolic BP Percentile --      Pulse Rate 03/08/23 0422 81     Resp 03/08/23 0422 18     Temp 03/08/23 0422 97.9 F (36.6 C)     Temp Source 03/08/23 0422 Oral     SpO2 03/08/23 0422 99 %     Weight --      Height --      Head Circumference --      Peak Flow --      Pain Score 03/08/23 0423 4     Pain Loc --      Pain Education --      Exclude from Growth Chart --     Most recent vital signs: Vitals:   03/08/23 0422  BP: 136/84  Pulse: 81  Resp: 18  Temp: 97.9 F (36.6 C)  SpO2: 99%    General: Awake, no distress.  CV:  Good peripheral perfusion.  Regular rate, irregular rhythm Resp:  Normal effort.  Clear to auscultation bilaterally Abd:  No distention.  Soft, nontender Other:  Moist oral mucosa.  No lower extremity edema   ED Results / Procedures / Treatments   Labs (all labs ordered are listed, but only abnormal results are displayed) Labs Reviewed  BASIC METABOLIC PANEL - Abnormal; Notable for the following components:      Result Value   Glucose, Bld 107 (*)    Creatinine, Ser 1.60 (*)    Calcium 8.8 (*)    GFR, Estimated 45  (*)    All other components within normal limits  TROPONIN I (HIGH SENSITIVITY) - Abnormal; Notable for the following components:   Troponin I (High Sensitivity) 23 (*)    All other components within normal limits  CBC  MAGNESIUM  TROPONIN I (HIGH SENSITIVITY)     EKG Interpreted by me Atrial fibrillation, rate of 82.  Normal axis, normal intervals.  Poor R wave progression.  Normal ST segments and T waves.   RADIOLOGY Chest x-ray interpreted by me, appears unremarkable.  Radiology report reviewed.   PROCEDURES:  Procedures   MEDICATIONS ORDERED IN ED: Medications  sucralfate (CARAFATE) tablet 1 g (1 g Oral Given 03/08/23 0530)  famotidine (PEPCID) tablet 40 mg (40 mg Oral Given 03/08/23 0530)  metoCLOPramide (REGLAN) tablet 10 mg (10 mg Oral Given 03/08/23 0530)     IMPRESSION / MDM / ASSESSMENT AND PLAN / ED COURSE  I reviewed the triage vital signs and the nursing notes.  DDx: NSTEMI, CHF exacerbation, anemia, AKI, electrolyte abnormality.  Doubt  PE dissection pericardial effusion  Patient's presentation is most consistent with acute presentation with potential threat to life or bodily function.  Patient presents with nonspecific central chest pain starting at 2 AM this morning.  Initial workup is unrevealing, exam is reassuring.  Vital signs are normal.  Will trend troponin.  Will try antacids.   ----------------------------------------- 7:06 AM on 03/08/2023 ----------------------------------------- Patient reports symptoms have resolved.  Still in A-fib, heart rate in the 70s.  Repeat troponin pending.  If trend is flat, will be stable for discharge.  He is agreeable with following up with his cardiologist [name].      FINAL CLINICAL IMPRESSION(S) / ED DIAGNOSES   Final diagnoses:  Nonspecific chest pain  Atrial fibrillation, unspecified type (HCC)     Rx / DC Orders   ED Discharge Orders     None        Note:  This document was prepared  using Dragon voice recognition software and may include unintentional dictation errors.   Sharman Cheek, MD 03/08/23 [phone]

## 2023-03-08 NOTE — ED Triage Notes (Addendum)
Pt to ED via POV c/o central chest pressure that started last night. Does not radiate. Woke up at 2am with pain. Pt endorses some SOB with cp. Denies N/V, fevers. Hx of CHF

## 2023-03-10 NOTE — Progress Notes (Unsigned)
Cardiology Clinic Note   Date: 03/11/2023 ID: LANG ZINGG, DOB 03/04/1946, MRN [id number]  Primary Cardiologist:  Olga Millers, MD  Patient Profile    [name] is a 77 y.o. male who presents to the clinic today for hospital follow up.     Past medical history significant for: Chronic systolic heart failure/nonischemic cardiomyopathy. ICD implantation 09/17/2005. ICD removed 10/08/2012: Reached ERI and LV function improved.   Echo 11/11/2022: EF 25-30%. Global hypokinesis. Mild LVH. Grade II DD. Normal RV function. Moderately elevated PA pressure. Severe LAE. Moderate MR, TR. Mild to moderate AI. Mild dilatation aortic root 44 mm, mild dilatation ascending aorta 42 mm. Dilated IVC, RA pressure 8 mmHg.  PAF/aflutter.  New onset 03/08/2023. PVCs/NSVT. On amiodarone. Thoracic ascending aortic aneurysm. CTA chest aorta 02/14/2019: Stable ectatic 4.2 cm ascending thoracic aorta.  Recommend annual imaging.  One-vessel coronary atherosclerosis, cholelithiasis.  Small hiatal hernia, colonic diverticulosis. Hypertension. Hyperlipidemia.  Lipid panel 10/29/2022: LDL 65, HDL 120.4, TG 62, total 198. CKD stage III.     History of Present Illness    [name] is a long time patient of cardiology for nonischemic cardiomyopathy. He has a history of cardiac cath at [facility] in 2004 which showed normal coronaries.  Patient's cardiomyopathy is felt to possibly be secondary to ventricular ectopy in the past and he was on amiodarone.  Previous Holter in December 2012 showed frequent PVCs and NSVT.  He had an ICD placed in January 2007 which was removed in February 2014 when it reached ERI and his LV function improved.  CTA September 2022 showed ascending aortic aneurysm 41 mm.  Echo September 2022 showed EF 25 to 30%, moderate to severe LVE, Grade I DD, moderate RVE, mild LAE, mild MR, mild to moderate TR, mild to moderate AI and mildly dilated ascending aorta 43 mm.  Nuclear study October  2022 showed EF 39%, prior inferior infarct but no ischemia.  Chest CT March 2024 showed 8 mm right lower lobe nodule and follow-up recommended in 12 months.  He was referred back to Dr. Ladona Ridgel in March 2023 for consideration of ICD.  He was not interested in ICD reimplantation at that time.  Patient continues to be followed by [name] for the above outlined history.  Patient was last seen in the office by [name] on 11/04/2022 for routine follow-up.  He was stable at that time and no medication changes were made.  Repeat echo showed EF 25 to 30% (further details above).  Patient presented to the ED on 03/08/2023 with complaints of central chest pain that awoke him in the early morning hours without associated shortness of breath.  Troponin slightly elevated but flat 23>> 19.  EG showed A-fib, 82 bpm.  His symptoms resolved and he was discharged to follow up with cardiology as an outpatient.  Today, patient denies any further central chest pain since hospital visit. He continues to have some left sided pain under left breast wrapping around to back. This is a long standing problem for him for which he has received injections in his back for related to arthritis/nerve damage.  Patient has no cardiac awareness of arrhythmia. Patient denies shortness of breath or dyspnea on exertion. Denies lower extremity edema, orthopnea, or PND.  He is very active working on his 5 acres of land without difficulty.      ROS: All other systems reviewed and are otherwise negative except as noted in History of Present Illness.  Studies Reviewed  EKG Interpretation Date/Time:  Wednesday March 11 2023 11:14:44 EDT Ventricular Rate:  81 PR Interval:    QRS Duration:  118 QT Interval:  396 QTC Calculation: 460 R Axis:   53  Text Interpretation: Atrial flutter Non-specific intra-ventricular conduction delay ST & T wave abnormality, consider lateral ischemia Prolonged QT When compared with ECG of 08-Mar-2023  04:31, PREVIOUS ECG IS PRESENT Confirmed by Carlos Levering [phone]) on 03/11/2023 11:29:32 AM    Risk Assessment/Calculations     CHA2DS2-VASc Score = 4   This indicates a 4.8% annual risk of stroke. The patient's score is based upon: CHF History: 1 HTN History: 1 Diabetes History: 0 Stroke History: 0 Vascular Disease History: 0 Age Score: 2 Gender Score: 0             Physical Exam    VS:  BP 126/64 (BP Location: Left Arm, Patient Position: Sitting, Cuff Size: Normal)   Pulse 81   Ht 5\' 8"  (1.727 m)   Wt 170 lb 12.8 oz (77.5 kg)   SpO2 96%   BMI 25.97 kg/m  , BMI Body mass index is 25.97 kg/m.  GEN: Well nourished, well developed, in no acute distress. Neck: No JVD or carotid bruits. Cardiac: Irregular rhythm, controlled rate. No murmurs. No rubs or gallops.   Respiratory:  Respirations regular and unlabored. Clear to auscultation without rales, wheezing or rhonchi. GI: Soft, nontender, nondistended. Extremities: Radials/DP/PT 2+ and equal bilaterally. No clubbing or cyanosis. No edema   Skin: Warm and dry, no rash. Neuro: Strength intact.  Assessment & Plan    Chronic systolic heart failure/nonischemic cardiomyopathy.  Echo March 2024 showed EF 25 to 30%.  Patient was evaluated by Dr. Ladona Ridgel for possible ICD reimplantation (ICD removed February 2014 at [facility] when LV function had improved) but ultimately declined reimplantation.  Patient with lower extremity edema, shortness of breath, DOE, orthopnea, PND.  Euvolemic and well compensated on exam.  Continue carvedilol, hydralazine, isosorbide, spironolactone.  Patient has declined SGLT2 in the past secondary to cost. PAF.  Patient evaluated in the ED on 03/08/2023 for chest pain.  He was found to be in new onset A-fib with controlled rate.  Patient denies cardiac awareness of arrhythmia.  EKG a-flutter, 81 bpm.  Will start Eliquis 5 mg twice daily.  Samples provided.  Continue amiodarone.  Will refer to A-fib  clinic. PVCs/NSVT.  Nonischemic cardiomyopathy felt to be secondary to ventricular ectopy in the past.  He has been maintained on amiodarone for years.  TSH normal.  Continue amiodarone. Hypertension: BP today is 126/64.  Patient denies headaches, dizziness or vision changes. Continue carvedilol, hydralazine, isosorbide, spironolactone. Hyperlipidemia.  LDL March 2024 65, at goal.  Continue atorvastatin.  Disposition: Start Eliquis 5 mg twice daily.  Samples provided.  Referred to A-fib clinic.  Return for previously scheduled visit with [name] on 05/11/2021 or sooner as needed.         Signed, Etta Grandchild. Genesia Caslin, DNP, NP-C

## 2023-03-11 ENCOUNTER — Encounter: Payer: Self-pay | Admitting: Student

## 2023-03-11 ENCOUNTER — Ambulatory Visit: Payer: Medicare Other | Attending: Student | Admitting: Student

## 2023-03-11 VITALS — BP 126/64 | HR 81 | Ht 68.0 in | Wt 170.8 lb

## 2023-03-11 DIAGNOSIS — I5022 Chronic systolic (congestive) heart failure: Secondary | ICD-10-CM

## 2023-03-11 DIAGNOSIS — I493 Ventricular premature depolarization: Secondary | ICD-10-CM

## 2023-03-11 DIAGNOSIS — I4729 Other ventricular tachycardia: Secondary | ICD-10-CM

## 2023-03-11 DIAGNOSIS — I428 Other cardiomyopathies: Secondary | ICD-10-CM

## 2023-03-11 DIAGNOSIS — I480 Paroxysmal atrial fibrillation: Secondary | ICD-10-CM | POA: Diagnosis not present

## 2023-03-11 DIAGNOSIS — I10 Essential (primary) hypertension: Secondary | ICD-10-CM

## 2023-03-11 DIAGNOSIS — E785 Hyperlipidemia, unspecified: Secondary | ICD-10-CM

## 2023-03-11 MED ORDER — APIXABAN 5 MG PO TABS: 5.0000 mg | ORAL_TABLET | Freq: Two times a day (BID) | ORAL | 0 refills | Status: DC

## 2023-03-11 MED ORDER — APIXABAN 5 MG PO TABS: 5.0000 mg | ORAL_TABLET | Freq: Two times a day (BID) | ORAL | 11 refills | Status: DC

## 2023-03-11 NOTE — Patient Instructions (Signed)
Medication Instructions:  Start Eliquis 5 mg ( take 1 Tablet Twice Daily). *If you need a refill on your cardiac medications before your next appointment, please call your pharmacy*   Lab Work: No Labs If you have labs (blood work) drawn today and your tests are completely normal, you will receive your results only by: MyChart Message (if you have MyChart) OR A paper copy in the mail If you have any lab test that is abnormal or we need to change your treatment, we will call you to review the results.   Testing/Procedures: No Testing   Follow-Up: At [facility], you and your health needs are our priority.  As part of our continuing mission to provide you with exceptional heart care, we have created designated Provider Care Teams.  These Care Teams include your primary Cardiologist (physician) and Advanced Practice Providers (APPs -  Physician Assistants and Nurse Practitioners) who all work together to provide you with the care you need, when you need it.  We recommend signing up for the patient portal called "MyChart".  Sign up information is provided on this After Visit Summary.  MyChart is used to connect with patients for Virtual Visits (Telemedicine).  Patients are able to view lab/test results, encounter notes, upcoming appointments, etc.  Non-urgent messages can be sent to your provider as well.   To learn more about what you can do with MyChart, go to [email or web address].    Your next appointment:   Keep Scheduled Appointment  Provider:   Olga Millers, MD

## 2023-03-19 ENCOUNTER — Other Ambulatory Visit: Payer: Self-pay | Admitting: Cardiology

## 2023-04-02 ENCOUNTER — Ambulatory Visit (HOSPITAL_COMMUNITY)
Admission: RE | Admit: 2023-04-02 | Discharge: 2023-04-02 | Disposition: A | Discharge status: Home / Self Care | Payer: Medicare Other | Source: Ambulatory Visit | Attending: Internal Medicine | Admitting: Internal Medicine

## 2023-04-02 ENCOUNTER — Encounter (HOSPITAL_COMMUNITY): Payer: Self-pay | Admitting: Internal Medicine

## 2023-04-02 ENCOUNTER — Other Ambulatory Visit (HOSPITAL_COMMUNITY): Payer: Self-pay | Admitting: Internal Medicine

## 2023-04-02 VITALS — BP 128/78 | HR 70 | Ht 68.0 in | Wt 169.4 lb

## 2023-04-02 DIAGNOSIS — N189 Chronic kidney disease, unspecified: Secondary | ICD-10-CM | POA: Diagnosis not present

## 2023-04-02 DIAGNOSIS — D6869 Other thrombophilia: Secondary | ICD-10-CM

## 2023-04-02 DIAGNOSIS — I5022 Chronic systolic (congestive) heart failure: Secondary | ICD-10-CM | POA: Diagnosis not present

## 2023-04-02 DIAGNOSIS — I480 Paroxysmal atrial fibrillation: Secondary | ICD-10-CM | POA: Diagnosis not present

## 2023-04-02 DIAGNOSIS — Z7901 Long term (current) use of anticoagulants: Secondary | ICD-10-CM | POA: Diagnosis not present

## 2023-04-02 DIAGNOSIS — I4819 Other persistent atrial fibrillation: Secondary | ICD-10-CM | POA: Diagnosis not present

## 2023-04-02 DIAGNOSIS — R0602 Shortness of breath: Secondary | ICD-10-CM | POA: Diagnosis present

## 2023-04-02 DIAGNOSIS — I130 Hypertensive heart and chronic kidney disease with heart failure and stage 1 through stage 4 chronic kidney disease, or unspecified chronic kidney disease: Secondary | ICD-10-CM | POA: Diagnosis not present

## 2023-04-02 DIAGNOSIS — E1122 Type 2 diabetes mellitus with diabetic chronic kidney disease: Secondary | ICD-10-CM | POA: Diagnosis not present

## 2023-04-02 LAB — BASIC METABOLIC PANEL
Anion gap: 8 (ref 5–15)
BUN: 21 mg/dL (ref 8–23)
CO2: 24 mmol/L (ref 22–32)
Calcium: 8.4 mg/dL — ABNORMAL LOW (ref 8.9–10.3)
Chloride: 106 mmol/L (ref 98–111)
Creatinine, Ser: 1.71 mg/dL — ABNORMAL HIGH (ref 0.61–1.24)
GFR, Estimated: 41 mL/min — ABNORMAL LOW (ref >60)
Glucose, Bld: 100 mg/dL — ABNORMAL HIGH (ref 70–99)
Potassium: 4.1 mmol/L (ref 3.5–5.1)
Sodium: 138 mmol/L (ref 135–145)

## 2023-04-02 LAB — CBC
HCT: 38.8 % — ABNORMAL LOW (ref 39.0–52.0)
Hemoglobin: 13 g/dL (ref 13.0–17.0)
MCH: 32.6 pg (ref 26.0–34.0)
MCHC: 33.5 g/dL (ref 30.0–36.0)
MCV: 97.2 fL (ref 80.0–100.0)
Platelets: 255 10*3/uL (ref 150–400)
RBC: 3.99 MIL/uL — ABNORMAL LOW (ref 4.22–5.81)
RDW: 13.8 % (ref 11.5–15.5)
WBC: 9.4 10*3/uL (ref 4.0–10.5)
nRBC: 0 % (ref 0.0–0.2)

## 2023-04-02 NOTE — H&P (View-Only) (Signed)
 Primary Care Physician: [name], MD Primary Cardiologist: Olga Millers, MD Electrophysiologist: None     Referring Physician: Carlos Levering, NP     [name] is a 77 y.o. 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 History of ICD implantation but he never got it reimplanted after battery expired; this decision was made after discussion with Dr. Ladona Ridgel. Seen in ED on 7/21 due to chest pain and noted to be in rate controlled Afib. Seen by Cardiology on 7/24 and started on anticoagulation. He is on amiodarone 200 mg daily. Patient is on Eliquis for a CHADS2VASC score of 4.  On evaluation today, he is currently in Afib. He does notice SOB with activity. He began Eliquis with two doses on 7/26. He was advised by his PCP, seen the day after Cardiology, to reload his amiodarone 200 mg BID x 2 weeks. He completed this amiodarone reload and is back to taking amiodarone 200 mg daily. No missed doses or issues with Eliquis.   Today, he denies symptoms of palpitations, chest pain, orthopnea, PND, lower extremity edema, dizziness, presyncope, syncope, snoring, daytime somnolence, bleeding, or neurologic sequela. The patient is tolerating medications without difficulties and is otherwise without complaint today.    he has a BMI of Body mass index is 25.76 kg/m.Marland Kitchen Filed Weights   04/02/23 0835  Weight: 76.8 kg    Current Outpatient Medications  Medication Sig Dispense Refill   amiodarone (PACERONE) 200 MG tablet TAKE 1 TABLET BY MOUTH EVERY DAY 90 tablet 3   apixaban (ELIQUIS) 5 MG TABS tablet Take 1 tablet (5 mg total) by mouth 2 (two) times daily. 56 tablet 0   apixaban (ELIQUIS) 5 MG TABS tablet Take 1 tablet (5 mg total) by mouth 2 (two) times daily. 60 tablet 11    atorvastatin (LIPITOR) 10 MG tablet Take 1 tablet (10 mg total) by mouth daily. 90 tablet 1   carvedilol (COREG) 6.25 MG tablet TAKE 1 TABLET BY MOUTH TWICE A DAY 180 tablet 3   cetirizine (ZYRTEC) 10 MG tablet Take 10 mg by mouth daily.     EPINEPHrine 0.3 mg/0.3 mL IJ SOAJ injection Inject 0.3 mg into the muscle as needed for anaphylaxis.     famotidine (PEPCID) 40 MG tablet Take 40 mg by mouth 2 (two) times daily.     hydrALAZINE (APRESOLINE) 50 MG tablet Take 0.5 tablets (25 mg total) by mouth 2 (two) times daily. Take 1/2 tablet BID 180 tablet 3   isosorbide mononitrate (IMDUR) 30 MG 24 hr tablet Take 1 tablet (30 mg total) by mouth daily.     spironolactone (ALDACTONE) 25 MG tablet TAKE 1/2 TABLET BY MOUTH DAILY 45 tablet 3   diphenhydrAMINE (BENADRYL) 25 mg capsule Take 25 mg by mouth every 6 (six) hours as needed. (Patient not taking: Reported on 04/02/2023)     No current facility-administered medications for this encounter.    Atrial Fibrillation Management history:  Previous antiarrhythmic drugs: amiodarone Previous cardioversions: None Previous ablations: None Anticoagulation history: Eliquis   ROS- All systems are reviewed and negative except as per the HPI above.  Physical Exam: BP 128/78   Pulse 70   Ht 5\' 8"  (1.727 m)   Wt 76.8 kg   BMI 25.76 kg/m   GEN: Well nourished, well developed in no acute distress NECK: No  JVD; No carotid bruits CARDIAC: Irregularly irregular rate and rhythm, no murmurs, rubs, gallops RESPIRATORY:  Clear to auscultation without rales, wheezing or rhonchi  ABDOMEN: Soft, non-tender, non-distended EXTREMITIES:  No edema; No deformity   EKG today demonstrates  Vent. rate 70 BPM PR interval * ms QRS duration 120 ms QT/QTcB 460/496 ms P-R-T axes * -10 -41 Atrial fibrillation Inferior infarct , age undetermined ST & T wave abnormality, consider lateral ischemia Abnormal ECG When compared with ECG of 11-Mar-2023 11:14, PREVIOUS ECG IS  PRESENT  Echo 11/11/22 demonstrated  1. Left ventricular ejection fraction, by estimation, is 25 to 30%. Left  ventricular ejection fraction by 2D MOD biplane is 30.4 %. The left  ventricle has severely decreased function. The left ventricle demonstrates  global hypokinesis. There is mild  left ventricular hypertrophy. Left ventricular diastolic parameters are  consistent with Grade II diastolic dysfunction (pseudonormalization). The  average left ventricular global longitudinal strain is -11.8 %.   2. Right ventricular systolic function is normal. The right ventricular  size is normal. There is moderately elevated pulmonary artery systolic  pressure. The estimated right ventricular systolic pressure is 57.6 mmHg.   3. Left atrial size was severely dilated.   4. The mitral valve is normal in structure. Moderate mitral valve  regurgitation. No evidence of mitral stenosis.   5. Tricuspid valve regurgitation is moderate.   6. The aortic valve has an indeterminant number of cusps. Aortic valve  regurgitation is mild to moderate. No aortic stenosis is present.   7. There is mild dilatation of the aortic root, measuring 44 mm. There is  mild dilatation of the ascending aorta, measuring 42 mm.   8. The inferior vena cava is dilated in size with >50% respiratory  variability, suggesting right atrial pressure of 8 mmHg.   ASSESSMENT & PLAN CHA2DS2-VASc Score = 4  The patient's score is based upon: CHF History: 1 HTN History: 1 Diabetes History: 0 Stroke History: 0 Vascular Disease History: 0 Age Score: 2 Gender Score: 0       ASSESSMENT AND PLAN: Persistent Atrial Fibrillation (ICD10:  I48.19) The patient's CHA2DS2-VASc score is 4, indicating a 4.8% annual risk of stroke.    He is currently in Afib.  Education provided about Afib. Discussion about medication treatments and ablation going forward if indicated. After discussion, we will proceed with scheduling DCCV. Labs will be  obtained today.  Informed Consent   Shared Decision Making/Informed Consent The risks (stroke, cardiac arrhythmias rarely resulting in the need for a temporary or permanent pacemaker, skin irritation or burns and complications associated with conscious sedation including aspiration, arrhythmia, respiratory failure and death), benefits (restoration of normal sinus rhythm) and alternatives of a direct current cardioversion were explained in detail to Mr. Mcclenney and he agrees to proceed.      I discussed with patient going forward if he has ERAF can possibly consider ablation, but this would ultimately be a discussion with EP. He has a severely dilated LA and current systolic dysfunction which may be prohibitive of ablation.   Secondary Hypercoagulable State (ICD10:  D68.69) The patient is at significant risk for stroke/thromboembolism based upon his CHA2DS2-VASc Score of 4.  Continue Apixaban (Eliquis).  No missed doses since 7/26.     Follow up 1-2 weeks after DCCV.   Lake Bells, PA-C  Afib Clinic [facility] [address] [place], [place] [address] [phone]

## 2023-04-02 NOTE — Progress Notes (Addendum)
Primary Care Physician: [name], MD Primary Cardiologist: Olga Millers, MD Electrophysiologist: None     Referring Physician: Carlos Levering, NP     [name] is a 77 y.o. 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 History of ICD implantation but he never got it reimplanted after battery expired; this decision was made after discussion with Dr. Ladona Ridgel. Seen in ED on 7/21 due to chest pain and noted to be in rate controlled Afib. Seen by Cardiology on 7/24 and started on anticoagulation. He is on amiodarone 200 mg daily. Patient is on Eliquis for a CHADS2VASC score of 4.  On evaluation today, he is currently in Afib. He does notice SOB with activity. He began Eliquis with two doses on 7/26. He was advised by his PCP, seen the day after Cardiology, to reload his amiodarone 200 mg BID x 2 weeks. He completed this amiodarone reload and is back to taking amiodarone 200 mg daily. No missed doses or issues with Eliquis.   Today, he denies symptoms of palpitations, chest pain, orthopnea, PND, lower extremity edema, dizziness, presyncope, syncope, snoring, daytime somnolence, bleeding, or neurologic sequela. The patient is tolerating medications without difficulties and is otherwise without complaint today.    he has a BMI of Body mass index is 25.76 kg/m.Marland Kitchen Filed Weights   04/02/23 0835  Weight: 76.8 kg    Current Outpatient Medications  Medication Sig Dispense Refill   amiodarone (PACERONE) 200 MG tablet TAKE 1 TABLET BY MOUTH EVERY DAY 90 tablet 3   apixaban (ELIQUIS) 5 MG TABS tablet Take 1 tablet (5 mg total) by mouth 2 (two) times daily. 56 tablet 0   apixaban (ELIQUIS) 5 MG TABS tablet Take 1 tablet (5 mg total) by mouth 2 (two) times daily. 60 tablet 11    atorvastatin (LIPITOR) 10 MG tablet Take 1 tablet (10 mg total) by mouth daily. 90 tablet 1   carvedilol (COREG) 6.25 MG tablet TAKE 1 TABLET BY MOUTH TWICE A DAY 180 tablet 3   cetirizine (ZYRTEC) 10 MG tablet Take 10 mg by mouth daily.     EPINEPHrine 0.3 mg/0.3 mL IJ SOAJ injection Inject 0.3 mg into the muscle as needed for anaphylaxis.     famotidine (PEPCID) 40 MG tablet Take 40 mg by mouth 2 (two) times daily.     hydrALAZINE (APRESOLINE) 50 MG tablet Take 0.5 tablets (25 mg total) by mouth 2 (two) times daily. Take 1/2 tablet BID 180 tablet 3   isosorbide mononitrate (IMDUR) 30 MG 24 hr tablet Take 1 tablet (30 mg total) by mouth daily.     spironolactone (ALDACTONE) 25 MG tablet TAKE 1/2 TABLET BY MOUTH DAILY 45 tablet 3   diphenhydrAMINE (BENADRYL) 25 mg capsule Take 25 mg by mouth every 6 (six) hours as needed. (Patient not taking: Reported on 04/02/2023)     No current facility-administered medications for this encounter.    Atrial Fibrillation Management history:  Previous antiarrhythmic drugs: amiodarone Previous cardioversions: None Previous ablations: None Anticoagulation history: Eliquis   ROS- All systems are reviewed and negative except as per the HPI above.  Physical Exam: BP 128/78   Pulse 70   Ht 5\' 8"  (1.727 m)   Wt 76.8 kg   BMI 25.76 kg/m   GEN: Well nourished, well developed in no acute distress NECK: No  JVD; No carotid bruits CARDIAC: Irregularly irregular rate and rhythm, no murmurs, rubs, gallops RESPIRATORY:  Clear to auscultation without rales, wheezing or rhonchi  ABDOMEN: Soft, non-tender, non-distended EXTREMITIES:  No edema; No deformity   EKG today demonstrates  Vent. rate 70 BPM PR interval * ms QRS duration 120 ms QT/QTcB 460/496 ms P-R-T axes * -10 -41 Atrial fibrillation Inferior infarct , age undetermined ST & T wave abnormality, consider lateral ischemia Abnormal ECG When compared with ECG of 11-Mar-2023 11:14, PREVIOUS ECG IS  PRESENT  Echo 11/11/22 demonstrated  1. Left ventricular ejection fraction, by estimation, is 25 to 30%. Left  ventricular ejection fraction by 2D MOD biplane is 30.4 %. The left  ventricle has severely decreased function. The left ventricle demonstrates  global hypokinesis. There is mild  left ventricular hypertrophy. Left ventricular diastolic parameters are  consistent with Grade II diastolic dysfunction (pseudonormalization). The  average left ventricular global longitudinal strain is -11.8 %.   2. Right ventricular systolic function is normal. The right ventricular  size is normal. There is moderately elevated pulmonary artery systolic  pressure. The estimated right ventricular systolic pressure is 57.6 mmHg.   3. Left atrial size was severely dilated.   4. The mitral valve is normal in structure. Moderate mitral valve  regurgitation. No evidence of mitral stenosis.   5. Tricuspid valve regurgitation is moderate.   6. The aortic valve has an indeterminant number of cusps. Aortic valve  regurgitation is mild to moderate. No aortic stenosis is present.   7. There is mild dilatation of the aortic root, measuring 44 mm. There is  mild dilatation of the ascending aorta, measuring 42 mm.   8. The inferior vena cava is dilated in size with >50% respiratory  variability, suggesting right atrial pressure of 8 mmHg.   ASSESSMENT & PLAN CHA2DS2-VASc Score = 4  The patient's score is based upon: CHF History: 1 HTN History: 1 Diabetes History: 0 Stroke History: 0 Vascular Disease History: 0 Age Score: 2 Gender Score: 0       ASSESSMENT AND PLAN: Persistent Atrial Fibrillation (ICD10:  I48.19) The patient's CHA2DS2-VASc score is 4, indicating a 4.8% annual risk of stroke.    He is currently in Afib.  Education provided about Afib. Discussion about medication treatments and ablation going forward if indicated. After discussion, we will proceed with scheduling DCCV. Labs will be  obtained today.  Informed Consent   Shared Decision Making/Informed Consent The risks (stroke, cardiac arrhythmias rarely resulting in the need for a temporary or permanent pacemaker, skin irritation or burns and complications associated with conscious sedation including aspiration, arrhythmia, respiratory failure and death), benefits (restoration of normal sinus rhythm) and alternatives of a direct current cardioversion were explained in detail to Mr. Mcclenney and he agrees to proceed.      I discussed with patient going forward if he has ERAF can possibly consider ablation, but this would ultimately be a discussion with EP. He has a severely dilated LA and current systolic dysfunction which may be prohibitive of ablation.   Secondary Hypercoagulable State (ICD10:  D68.69) The patient is at significant risk for stroke/thromboembolism based upon his CHA2DS2-VASc Score of 4.  Continue Apixaban (Eliquis).  No missed doses since 7/26.     Follow up 1-2 weeks after DCCV.   Lake Bells, PA-C  Afib Clinic [facility] [address] [place], [place] [address] [phone]

## 2023-04-02 NOTE — Patient Instructions (Addendum)
Cardioversion scheduled for: August 20th 2024   - Arrive at the [facility] and go to admitting at 7:30 am   - Do not eat or drink anything after midnight the night prior to your procedure.   - Take all your morning medication (except diabetic medications) with a sip of water prior to arrival.  - You will not be able to drive home after your procedure.    - Do NOT miss any doses of your blood thinner - if you should miss a dose please notify our office immediately.   - If you feel as if you go back into normal rhythm prior to scheduled cardioversion, please notify our office immediately.   If your procedure is canceled in the cardioversion suite you will be charged a cancellation fee.

## 2023-04-06 NOTE — Progress Notes (Signed)
Spoke to pt and instructed them to come at 0815 and to be NPO after 0000. Confirmed no missed doses of AC and instructed to take in AM with a small sip of water.   Confirmed that pt will have a ride home and someone to stay with them for 24 hours after the procedure.

## 2023-04-06 NOTE — Anesthesia Preprocedure Evaluation (Signed)
Anesthesia Evaluation  Patient identified by MRN, date of birth, ID band Patient awake    Reviewed: Allergy & Precautions, NPO status , Patient's Chart, lab work & pertinent test results  History of Anesthesia Complications Negative for: history of anesthetic complications  Airway Mallampati: II  TM Distance: >3 FB Neck ROM: Full   Comment: Previous grade I view with MAC 4 Dental  (+)    Pulmonary neg shortness of breath, asthma (no recent flares) , neg sleep apnea, neg COPD, neg recent URI, former smoker   Pulmonary exam normal breath sounds clear to auscultation       Cardiovascular hypertension (carvedilol, hydralazine, ISMN, spironolactone), Pt. on home beta blockers and Pt. on medications pulmonary hypertension (moderate)(-) angina +CHF (EF 25-30%, grade II diastolic dysfunction)  (-) Past MI, (-) Cardiac Stents and (-) CABG + dysrhythmias (PVCs, NSVT) Atrial Fibrillation Cardiac Defibrillator: patient has leads only. + Valvular Problems/Murmurs (moderate MR and TR, mild-to-moderate AI)  Rhythm:Irregular Rate:Normal  HLD  TTE 11/11/2022: IMPRESSIONS    1. Left ventricular ejection fraction, by estimation, is 25 to 30%. Left  ventricular ejection fraction by 2D MOD biplane is 30.4 %. The left  ventricle has severely decreased function. The left ventricle demonstrates  global hypokinesis. There is mild  left ventricular hypertrophy. Left ventricular diastolic parameters are  consistent with Grade II diastolic dysfunction (pseudonormalization). The  average left ventricular global longitudinal strain is -11.8 %.   2. Right ventricular systolic function is normal. The right ventricular  size is normal. There is moderately elevated pulmonary artery systolic  pressure. The estimated right ventricular systolic pressure is 57.6 mmHg.   3. Left atrial size was severely dilated.   4. The mitral valve is normal in structure. Moderate  mitral valve  regurgitation. No evidence of mitral stenosis.   5. Tricuspid valve regurgitation is moderate.   6. The aortic valve has an indeterminant number of cusps. Aortic valve  regurgitation is mild to moderate. No aortic stenosis is present.   7. There is mild dilatation of the aortic root, measuring 44 mm. There is  mild dilatation of the ascending aorta, measuring 42 mm.   8. The inferior vena cava is dilated in size with >50% respiratory  variability, suggesting right atrial pressure of 8 mmHg.     Neuro/Psych neg Seizures PSYCHIATRIC DISORDERS Anxiety      Neuromuscular disease (spondylosis)    GI/Hepatic Neg liver ROS,GERD  ,,  Endo/Other  negative endocrine ROS    Renal/GU CRFRenal disease     Musculoskeletal  (+) Arthritis , Osteoarthritis,  Osteoporosis    Abdominal   Peds  Hematology negative hematology ROS (+)   Anesthesia Other Findings Psoriasis   Last Eliquis: this morning  Alpha gal allergy  Reproductive/Obstetrics                             Anesthesia Physical Anesthesia Plan  ASA: 4  Anesthesia Plan: General   Post-op Pain Management: Minimal or no pain anticipated   Induction: Intravenous  PONV Risk Score and Plan: 2 and Treatment may vary due to age or medical condition  Airway Management Planned: Natural Airway and Nasal Cannula  Additional Equipment:   Intra-op Plan:   Post-operative Plan:   Informed Consent: I have reviewed the patients History and Physical, chart, labs and discussed the procedure including the risks, benefits and alternatives for the proposed anesthesia with the patient or authorized representative who has indicated  his/her understanding and acceptance.     Dental advisory given  Plan Discussed with: CRNA and Anesthesiologist  Anesthesia Plan Comments: (Risks of general anesthesia discussed including, but not limited to, sore throat, hoarse voice, chipped/damaged teeth, injury to  vocal cords, nausea and vomiting, allergic reactions, lung infection, heart attack, stroke, and death. All questions answered. )        Anesthesia Quick Evaluation

## 2023-04-07 ENCOUNTER — Ambulatory Visit (HOSPITAL_COMMUNITY)
Admission: RE | Admit: 2023-04-07 | Discharge: 2023-04-07 | Disposition: A | Discharge status: Home / Self Care | Payer: Medicare Other | Attending: Cardiovascular Disease | Admitting: Cardiovascular Disease

## 2023-04-07 ENCOUNTER — Emergency Department
Admission: EM | Admit: 2023-04-07 | Discharge: 2023-04-07 | Disposition: A | Discharge status: Home / Self Care | Payer: Medicare Other | Attending: Emergency Medicine | Admitting: Emergency Medicine

## 2023-04-07 ENCOUNTER — Ambulatory Visit (HOSPITAL_BASED_OUTPATIENT_CLINIC_OR_DEPARTMENT_OTHER): Payer: Medicare Other | Admitting: Anesthesiology

## 2023-04-07 ENCOUNTER — Other Ambulatory Visit: Payer: Self-pay

## 2023-04-07 ENCOUNTER — Emergency Department: Payer: Medicare Other

## 2023-04-07 ENCOUNTER — Encounter (HOSPITAL_COMMUNITY)
Admission: RE | Disposition: A | Discharge status: Home / Self Care | Payer: Self-pay | Source: Home / Self Care | Attending: Cardiovascular Disease

## 2023-04-07 ENCOUNTER — Encounter (HOSPITAL_COMMUNITY): Payer: Self-pay | Admitting: Cardiovascular Disease

## 2023-04-07 ENCOUNTER — Ambulatory Visit (HOSPITAL_COMMUNITY): Payer: Medicare Other | Admitting: Anesthesiology

## 2023-04-07 ENCOUNTER — Encounter: Payer: Self-pay | Admitting: Emergency Medicine

## 2023-04-07 DIAGNOSIS — W010XXA Fall on same level from slipping, tripping and stumbling without subsequent striking against object, initial encounter: Secondary | ICD-10-CM | POA: Insufficient documentation

## 2023-04-07 DIAGNOSIS — I5022 Chronic systolic (congestive) heart failure: Secondary | ICD-10-CM | POA: Insufficient documentation

## 2023-04-07 DIAGNOSIS — E785 Hyperlipidemia, unspecified: Secondary | ICD-10-CM | POA: Insufficient documentation

## 2023-04-07 DIAGNOSIS — I130 Hypertensive heart and chronic kidney disease with heart failure and stage 1 through stage 4 chronic kidney disease, or unspecified chronic kidney disease: Secondary | ICD-10-CM | POA: Insufficient documentation

## 2023-04-07 DIAGNOSIS — I428 Other cardiomyopathies: Secondary | ICD-10-CM | POA: Diagnosis not present

## 2023-04-07 DIAGNOSIS — N183 Chronic kidney disease, stage 3 unspecified: Secondary | ICD-10-CM | POA: Diagnosis not present

## 2023-04-07 DIAGNOSIS — W19XXXA Unspecified fall, initial encounter: Secondary | ICD-10-CM

## 2023-04-07 DIAGNOSIS — I4891 Unspecified atrial fibrillation: Secondary | ICD-10-CM | POA: Diagnosis not present

## 2023-04-07 DIAGNOSIS — S299XXA Unspecified injury of thorax, initial encounter: Secondary | ICD-10-CM | POA: Diagnosis present

## 2023-04-07 DIAGNOSIS — S20212A Contusion of left front wall of thorax, initial encounter: Secondary | ICD-10-CM | POA: Insufficient documentation

## 2023-04-07 DIAGNOSIS — D6869 Other thrombophilia: Secondary | ICD-10-CM | POA: Diagnosis not present

## 2023-04-07 DIAGNOSIS — I4819 Other persistent atrial fibrillation: Secondary | ICD-10-CM | POA: Diagnosis present

## 2023-04-07 DIAGNOSIS — N189 Chronic kidney disease, unspecified: Secondary | ICD-10-CM | POA: Insufficient documentation

## 2023-04-07 DIAGNOSIS — Z79899 Other long term (current) drug therapy: Secondary | ICD-10-CM | POA: Insufficient documentation

## 2023-04-07 DIAGNOSIS — Z7901 Long term (current) use of anticoagulants: Secondary | ICD-10-CM | POA: Diagnosis not present

## 2023-04-07 HISTORY — PX: CARDIOVERSION: SHX1299

## 2023-04-07 SURGERY — CARDIOVERSION
Anesthesia: General

## 2023-04-07 MED ORDER — PROPOFOL 10 MG/ML IV BOLUS
INTRAVENOUS | Status: DC | PRN
  Administered 2023-04-07: 20 mg via INTRAVENOUS
  Administered 2023-04-07: 60 mg via INTRAVENOUS

## 2023-04-07 MED ORDER — PREDNISONE 10 MG PO TABS
10.0000 mg | ORAL_TABLET | ORAL | 0 refills | Status: AC
Start: 2023-04-07 — End: ?

## 2023-04-07 MED ORDER — DEXAMETHASONE SODIUM PHOSPHATE 10 MG/ML IJ SOLN
10.0000 mg | Freq: Once | INTRAMUSCULAR | Status: AC
  Administered 2023-04-07: 10 mg via INTRAMUSCULAR
  Filled 2023-04-07: qty 1

## 2023-04-07 MED ORDER — LIDOCAINE 2% (20 MG/ML) 5 ML SYRINGE
INTRAMUSCULAR | Status: DC | PRN
  Administered 2023-04-07: 60 mg via INTRAVENOUS

## 2023-04-07 SURGICAL SUPPLY — 1 items: ELECT DEFIB PAD ADLT CADENCE (PAD) ×1 IMPLANT

## 2023-04-07 NOTE — CV Procedure (Signed)
Electrical Cardioversion Procedure Note [name] [id number] 1946/05/01  Procedure: Electrical Cardioversion Indications:  Atrial Fibrillation  Procedure Details Consent: Risks of procedure as well as the alternatives and risks of each were explained to the (patient/caregiver).  Consent for procedure obtained. Time Out: Verified patient identification, verified procedure, site/side was marked, verified correct patient position, special equipment/implants available, medications/allergies/relevent history reviewed, required imaging and test results available.  Performed  Patient placed on cardiac monitor, pulse oximetry, supplemental oxygen as necessary.  Sedation given:  propofol Pacer pads placed anterior and posterior chest.  Cardioverted 1 time(s).  Cardioverted at 150J.  Evaluation Findings: Post procedure EKG shows:  sinus rhythm with PACs Complications: None Patient did tolerate procedure well.   Chilton Si, MD 04/07/2023, 8:38 AM

## 2023-04-07 NOTE — Anesthesia Postprocedure Evaluation (Signed)
Anesthesia Post Note  Patient: [name]  Procedure(s) Performed: CARDIOVERSION     Patient location during evaluation: PACU Anesthesia Type: General Level of consciousness: awake Pain management: pain level controlled Vital Signs Assessment: post-procedure vital signs reviewed and stable Respiratory status: spontaneous breathing, nonlabored ventilation and respiratory function stable Cardiovascular status: blood pressure returned to baseline and stable Postop Assessment: no apparent nausea or vomiting Anesthetic complications: no   No notable events documented.  Last Vitals:  Vitals:   04/07/23 0851 04/07/23 0900  BP: 104/64 103/67  Pulse: (!) 58 (!) 58  Resp: 15 12  Temp:  36.8 C  SpO2: 98% 100%    Last Pain:  Vitals:   04/07/23 0900  TempSrc: Temporal  PainSc: 0-No pain                 Linton Rump

## 2023-04-07 NOTE — ED Triage Notes (Signed)
Pt via POV from home. Pt c/o mechanical fall today. Pt c/o L sided rib pain. Pt is on blood thinners but denies any head injury or LOC. Denies any other pain. Pt does have abrasion to the L arm, bleeding controlled at this time. Pt is A&Ox4 and NAD

## 2023-04-07 NOTE — Transfer of Care (Signed)
Immediate Anesthesia Transfer of Care Note  Patient: [name]  Procedure(s) Performed: CARDIOVERSION  Patient Location: Cath Lab  Anesthesia Type:General  Level of Consciousness: awake, drowsy, and patient cooperative  Airway & Oxygen Therapy: Patient Spontanous Breathing and Patient connected to nasal cannula oxygen  Post-op Assessment: Report given to RN and Post -op Vital signs reviewed and stable  Post vital signs: Reviewed and stable  Last Vitals:  Vitals Value Taken Time  BP    Temp    Pulse    Resp    SpO2      Last Pain:  Vitals:   04/07/23 0744  TempSrc: Temporal  PainSc: 0-No pain         Complications: No notable events documented.
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
## 2023-04-07 NOTE — Interval H&P Note (Signed)
History and Physical Interval Note:  04/07/2023 8:13 AM  [name]  has presented today for surgery, with the diagnosis of AFIB.  The various methods of treatment have been discussed with the patient and family. After consideration of risks, benefits and other options for treatment, the patient has consented to  Procedure(s): CARDIOVERSION (N/A) as a surgical intervention.  The patient's history has been reviewed, patient examined, no change in status, stable for surgery.  I have reviewed the patient's chart and labs.  Questions were answered to the patient's satisfaction.     Chilton Si, MD

## 2023-04-08 ENCOUNTER — Encounter (HOSPITAL_COMMUNITY): Payer: Self-pay | Admitting: Cardiovascular Disease

## 2023-04-22 ENCOUNTER — Ambulatory Visit (HOSPITAL_COMMUNITY)
Admission: RE | Admit: 2023-04-22 | Discharge: 2023-04-22 | Disposition: A | Discharge status: Home / Self Care | Payer: Medicare Other | Source: Ambulatory Visit | Attending: Internal Medicine | Admitting: Internal Medicine

## 2023-04-22 ENCOUNTER — Other Ambulatory Visit: Payer: Self-pay | Admitting: Cardiology

## 2023-04-22 VITALS — BP 126/72 | HR 61 | Ht 68.0 in | Wt 174.2 lb

## 2023-04-22 DIAGNOSIS — I5022 Chronic systolic (congestive) heart failure: Secondary | ICD-10-CM | POA: Insufficient documentation

## 2023-04-22 DIAGNOSIS — E785 Hyperlipidemia, unspecified: Secondary | ICD-10-CM | POA: Diagnosis not present

## 2023-04-22 DIAGNOSIS — I4819 Other persistent atrial fibrillation: Secondary | ICD-10-CM | POA: Diagnosis present

## 2023-04-22 DIAGNOSIS — I428 Other cardiomyopathies: Secondary | ICD-10-CM | POA: Diagnosis not present

## 2023-04-22 DIAGNOSIS — I110 Hypertensive heart disease with heart failure: Secondary | ICD-10-CM | POA: Diagnosis not present

## 2023-04-22 DIAGNOSIS — D6869 Other thrombophilia: Secondary | ICD-10-CM | POA: Diagnosis not present

## 2023-04-22 DIAGNOSIS — Z5181 Encounter for therapeutic drug level monitoring: Secondary | ICD-10-CM

## 2023-04-22 DIAGNOSIS — Z79899 Other long term (current) drug therapy: Secondary | ICD-10-CM | POA: Diagnosis not present

## 2023-04-22 NOTE — Progress Notes (Signed)
Primary Care Physician: [name], MD Primary Cardiologist: Olga Millers, MD Electrophysiologist: None     Referring Physician: Carlos Levering, NP     [name] is a 77 y.o. 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 History of ICD implantation but he never got it reimplanted after battery expired; this decision was made after discussion with Dr. Ladona Ridgel. Seen in ED on 7/21 due to chest pain and noted to be in rate controlled Afib. Seen by Cardiology on 7/24 and started on anticoagulation. He is on amiodarone 200 mg daily. Patient is on Eliquis for a CHADS2VASC score of 4.  On evaluation today, he is currently in Afib. He does notice SOB with activity. He began Eliquis with two doses on 7/26. He was advised by his PCP, seen the day after Cardiology, to reload his amiodarone 200 mg BID x 2 weeks. He completed this amiodarone reload and is back to taking amiodarone 200 mg daily. No missed doses or issues with Eliquis.   On follow up 04/22/23, he is currently in NSR. S/p successful DCCV on 8/20. He is currently taking amiodarone 200 mg daily. He feels well overall. No missed doses of Eliquis.   Today, he denies symptoms of palpitations, chest pain, orthopnea, PND, lower extremity edema, dizziness, presyncope, syncope, snoring, daytime somnolence, bleeding, or neurologic sequela. The patient is tolerating medications without difficulties and is otherwise without complaint today.    he has a BMI of Body mass index is 26.49 kg/m.Marland Kitchen Filed Weights   04/22/23 1018  Weight: 79 kg     Current Outpatient Medications  Medication Sig Dispense Refill   amiodarone (PACERONE) 200 MG tablet TAKE 1 TABLET BY MOUTH EVERY DAY 90 tablet 3   apixaban (ELIQUIS) 5 MG TABS tablet Take 1  tablet (5 mg total) by mouth 2 (two) times daily. 60 tablet 11   atorvastatin (LIPITOR) 10 MG tablet Take 1 tablet (10 mg total) by mouth daily. 90 tablet 1   carvedilol (COREG) 6.25 MG tablet TAKE 1 TABLET BY MOUTH TWICE A DAY 180 tablet 3   cetirizine (ZYRTEC) 10 MG tablet Take 10 mg by mouth daily.     EPINEPHrine 0.3 mg/0.3 mL IJ SOAJ injection Inject 0.3 mg into the muscle as needed for anaphylaxis.     famotidine (PEPCID) 20 MG tablet Take 20 mg by mouth daily.     hydrALAZINE (APRESOLINE) 50 MG tablet Take 0.5 tablets (25 mg total) by mouth 2 (two) times daily. Take 1/2 tablet BID 180 tablet 3   isosorbide mononitrate (IMDUR) 30 MG 24 hr tablet Take 1 tablet (30 mg total) by mouth daily.     Menthol-Methyl Salicylate (SALONPAS PAIN RELIEF PATCH) 3-10 % PTCH Apply 1 application  topically daily.     predniSONE (DELTASONE) 10 MG tablet Take 1 tablet (10 mg total) by mouth as directed. (Patient taking differently: Take 10 mg by mouth as needed.) 42 tablet 0   spironolactone (ALDACTONE) 25 MG tablet TAKE 1/2 TABLET BY MOUTH DAILY 45 tablet 3   No current facility-administered medications for this encounter.    Atrial Fibrillation Management history:  Previous antiarrhythmic drugs: amiodarone Previous cardioversions: 04/07/23 Previous ablations: None Anticoagulation history: Eliquis   ROS- All systems are reviewed and negative except as per the HPI above.  Physical Exam: BP 126/72   Pulse  61   Ht 5\' 8"  (1.727 m)   Wt 79 kg   BMI 26.49 kg/m   GEN- The patient is well appearing, alert and oriented x 3 today.   Neck - no JVD or carotid bruit noted Lungs- Clear to ausculation bilaterally, normal work of breathing Heart- Regular rate and rhythm, no murmurs, rubs or gallops, PMI not laterally displaced Extremities- no clubbing, cyanosis, or edema Skin - no rash or ecchymosis noted   EKG today demonstrates  Vent. rate 61 BPM PR interval 296 ms QRS duration 114 ms QT/QTcB  430/432 ms P-R-T axes -6 -12 -9 Sinus rhythm with 1st degree A-V block Inferior infarct , age undetermined Cannot rule out Anterior infarct , age undetermined Abnormal ECG When compared with ECG of 07-Apr-2023 08:39, PREVIOUS ECG IS PRESENT  Echo 11/11/22 demonstrated  1. Left ventricular ejection fraction, by estimation, is 25 to 30%. Left  ventricular ejection fraction by 2D MOD biplane is 30.4 %. The left  ventricle has severely decreased function. The left ventricle demonstrates  global hypokinesis. There is mild  left ventricular hypertrophy. Left ventricular diastolic parameters are  consistent with Grade II diastolic dysfunction (pseudonormalization). The  average left ventricular global longitudinal strain is -11.8 %.   2. Right ventricular systolic function is normal. The right ventricular  size is normal. There is moderately elevated pulmonary artery systolic  pressure. The estimated right ventricular systolic pressure is 57.6 mmHg.   3. Left atrial size was severely dilated.   4. The mitral valve is normal in structure. Moderate mitral valve  regurgitation. No evidence of mitral stenosis.   5. Tricuspid valve regurgitation is moderate.   6. The aortic valve has an indeterminant number of cusps. Aortic valve  regurgitation is mild to moderate. No aortic stenosis is present.   7. There is mild dilatation of the aortic root, measuring 44 mm. There is  mild dilatation of the ascending aorta, measuring 42 mm.   8. The inferior vena cava is dilated in size with >50% respiratory  variability, suggesting right atrial pressure of 8 mmHg.   ASSESSMENT & PLAN CHA2DS2-VASc Score = 4  The patient's score is based upon: CHF History: 1 HTN History: 1 Diabetes History: 0 Stroke History: 0 Vascular Disease History: 0 Age Score: 2 Gender Score: 0       ASSESSMENT AND PLAN: Persistent Atrial Fibrillation (ICD10:  I48.19) The patient's CHA2DS2-VASc score is 4, indicating a 4.8%  annual risk of stroke.   S/p successful DCCV on 04/07/23.   He is currently in NSR. Continue amiodarone 200 mg daily.   I discussed with patient going forward if he has ERAF can possibly consider ablation, but this would ultimately be a discussion with EP. He has a severely dilated LA and current systolic dysfunction which may be prohibitive of ablation.   Secondary Hypercoagulable State (ICD10:  D68.69) The patient is at significant risk for stroke/thromboembolism based upon his CHA2DS2-VASc Score of 4.  Continue Apixaban (Eliquis).  No missed doses since 7/26.     Follow up as scheduled with cardiologist. F/u 6 months Afib clinic.   Lake Bells, PA-C  Afib Clinic [facility] [address] [place], [place] [address] [phone]

## 2023-05-01 NOTE — Progress Notes (Signed)
HPI: FU nonischemic cardiomyopathy. Cardiac cath at [facility] in 2004 showed normal coronaries and EF 24; endocardial biopsy unrevealing. Note, his cardiomyopathy is felt possibly secondary to ventricular ectopy in the past and he is on amiodarone. Previous holter monitor in Dec 2012 showed frequent PVCs and nonsustained ventricular tachycardia. He also has had a previous ICD (generator removed in February of 2014 as his device had reached ERI and his LV function had improved). Abd ultrasound 1/15 showed no aneurysm. Nuclear study October 2022 showed ejection fraction 39%, prior inferior infarct but no ischemia.  Chest CT March 2024 showed 8 mm right lower lobe nodule and follow-up recommended 12 months, 4.1 cm ascending thoracic aortic aneurysm.  Last echocardiogram March 2024 showed ejection fraction 25 to 30%, mild left ventricular hypertrophy, grade 2 diastolic dysfunction, severe left atrial enlargement, moderate mitral regurgitation, moderate tricuspid regurgitation, mild to moderate aortic insufficiency, dilated aortic root at 44 mm.  Diagnosed with new onset atrial fibrillation July 2024.  He was started on anticoagulation and underwent elective cardioversion April 07, 2023.  Since last seen, patient is not doing as well as previously.  He feels as though he is back in atrial fibrillation with increased fatigue.  He notes some increased dyspnea on exertion but no orthopnea, PND or pedal edema.  He has not had chest pain.  He has had 2 separate "dizzy spells".  1 was when he was driving and was sudden in onset.  There was no associated palpitations, chest pain, dyspnea or nausea.  He had to pull off the road.  His episode lasted approximately 20 minutes and resolved spontaneously.  He had a second episode when walking to his garage that lasted 5 minutes.  Note he has not had syncope.  Current Outpatient Medications  Medication Sig Dispense Refill   amiodarone (PACERONE) 200 MG tablet TAKE 1 TABLET BY  MOUTH EVERY DAY 90 tablet 1   apixaban (ELIQUIS) 5 MG TABS tablet Take 1 tablet (5 mg total) by mouth 2 (two) times daily. 60 tablet 11   atorvastatin (LIPITOR) 10 MG tablet Take 1 tablet (10 mg total) by mouth daily. 90 tablet 1   carvedilol (COREG) 6.25 MG tablet TAKE 1 TABLET BY MOUTH TWICE A DAY 180 tablet 3   cetirizine (ZYRTEC) 10 MG tablet Take 10 mg by mouth daily.     famotidine (PEPCID) 20 MG tablet Take 20 mg by mouth daily.     hydrALAZINE (APRESOLINE) 50 MG tablet Take 0.5 tablets (25 mg total) by mouth 2 (two) times daily. Take 1/2 tablet BID 180 tablet 3   isosorbide mononitrate (IMDUR) 30 MG 24 hr tablet Take 1 tablet (30 mg total) by mouth daily.     Menthol-Methyl Salicylate (SALONPAS PAIN RELIEF PATCH) 3-10 % PTCH Apply 1 application  topically daily.     spironolactone (ALDACTONE) 25 MG tablet TAKE 1/2 TABLET BY MOUTH DAILY 45 tablet 3   EPINEPHrine 0.3 mg/0.3 mL IJ SOAJ injection Inject 0.3 mg into the muscle as needed for anaphylaxis. (Patient not taking: Reported on 05/12/2023)     predniSONE (DELTASONE) 10 MG tablet Take 1 tablet (10 mg total) by mouth as directed. (Patient not taking: Reported on 05/12/2023) 42 tablet 0   No current facility-administered medications for this visit.     Past Medical History:  Diagnosis Date   Abnormal chest CT    Anaphylactic reaction    Anxiety    Asthma    Benign hypertension    Cardiomyopathy  Cardiomyopathy, idiopathic (HCC)    CHF (congestive heart failure) (HCC)    Chronic renal insufficiency    With creatinine 1.4   Drug-induced urticaria    FH: thoracic aortic aneurysm    GERD (gastroesophageal reflux disease)    History of colon polyps    Hives    Long Hx of hives   Hyperlipidemia    Hyperlipidemia    IMPLANTATION OF DEFIBRILLATOR, HX OF 02/15/2009   Qualifier: Diagnosis of  By: Jens Som, MD, Lyn Hollingshead    NSVT (nonsustained ventricular tachycardia) (HCC)    Osteoarthritis    Osteoarthrosis,  unspecified whether generalized or localized, lower leg    Osteoporosis    Oxygen deficiency    Psoriasis    PVC (premature ventricular contraction)    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    Past Surgical History:  Procedure Laterality Date   APPENDECTOMY     BIV ICD GENERTAOR CHANGE OUT N/A 10/08/2012   Procedure: BIV ICD GENERTAOR CHANGE OUT;  Surgeon: Marinus Maw, MD;  Location: [facility] CATH LAB;  Service: Cardiovascular;  Laterality: N/A;   CARDIAC CATHETERIZATION     CARDIAC DEFIBRILLATOR REMOVAL  2014   CARDIOVERSION N/A 04/07/2023   Procedure: CARDIOVERSION;  Surgeon: Chilton Si, MD;  Location: [facility] INVASIVE CV LAB;  Service: Cardiovascular;  Laterality: N/A;   CATARACT EXTRACTION     CATARACT EXTRACTION W/PHACO Left 03/12/2016   Procedure: CATARACT EXTRACTION PHACO AND INTRAOCULAR LENS PLACEMENT (IOC);  Surgeon: Sallee Lange, MD;  Location: [facility] ORS;  Service: Ophthalmology;  Laterality: Left;  [place] 01:32 AP% 25.7 CDE 41.33 fluid pack lot # [id number]   COLONOSCOPY WITH PROPOFOL N/A 04/26/2019   Procedure: COLONOSCOPY WITH PROPOFOL;  Surgeon: Christena Deem, MD;  Location: [facility] ENDOSCOPY;  Service: Endoscopy;  Laterality: N/A;   ESOPHAGOGASTRODUODENOSCOPY     EYE SURGERY     Implantation of dual-chamber ICD.     [employer] DDD-ICD 1/07   JOINT REPLACEMENT Right 08/16/2009   KNEE ARTHROPLASTY Left 2006   KNEE ARTHROPLASTY Right 12/10   x2   LAPAROSCOPIC APPENDECTOMY N/A 08/12/2018   Procedure: APPENDECTOMY LAPAROSCOPIC;  Surgeon: Henrene Dodge, MD;  Location: [facility] ORS;  Service: General;  Laterality: N/A;   TONSILLECTOMY      Social History   Socioeconomic History   Marital status: Married    Spouse name: Not on file   Number of children: Not on file   Years of education: Not on file   Highest education level: Not on file  Occupational History   Not on file  Tobacco Use   Smoking status: Former    Current packs/day: 0.00    Average packs/day: 1  pack/day for 10.0 years (10.0 ttl pk-yrs)    Types: Cigarettes    Start date: 12/30/1960    Quit date: 12/31/1970    Years since quitting: 52.3   Smokeless tobacco: Never  Vaping Use   Vaping status: Never Used  Substance and Sexual Activity   Alcohol use: Yes    Alcohol/week: 0.0 standard drinks of alcohol    Comment: 2 DRINKS PER DAY, none this week (7/26)   Drug use: Never   Sexual activity: Not Currently  Other Topics Concern   Not on file  Social History Narrative   Not on file   Social Determinants of Health   Financial Resource Strain: Low Risk  (11/05/2022)   Received from [facility], [facility]   Overall Financial Resource Strain (  CARDIA)    Difficulty of Paying Living Expenses: Not hard at all  Food Insecurity: No Food Insecurity (11/05/2022)   Received from [facility], [facility]   Hunger Vital Sign    Worried About Running Out of Food in the Last Year: Never true    Ran Out of Food in the Last Year: Never true  Transportation Needs: No Transportation Needs (11/05/2022)   Received from [facility], [facility]   [facility] - Transportation    In the past 12 months, has lack of transportation kept you from medical appointments or from getting medications?: No    Lack of Transportation (Non-Medical): No  Physical Activity: Unknown (08/12/2018)   Exercise Vital Sign    Days of Exercise per Week: Patient declined    Minutes of Exercise per Session: Patient declined  Stress: No Stress Concern Present (08/12/2018)   [employer] of Occupational Health - Occupational Stress Questionnaire    Feeling of Stress : Not at all  Social Connections: Unknown (08/12/2018)   Social Connection and Isolation Panel [NHANES]    Frequency of Communication with Friends and Family: Patient declined    Frequency of Social Gatherings with Friends and Family: Patient declined     Attends Religious Services: Patient declined    Database administrator or Organizations: Patient declined    Attends Banker Meetings: Patient declined    Marital Status: Patient declined  Intimate Partner Violence: Not At Risk (08/12/2018)   Humiliation, Afraid, Rape, and Kick questionnaire    Fear of Current or Ex-Partner: No    Emotionally Abused: No    Physically Abused: No    Sexually Abused: No    Family History  Problem Relation Age of Onset   Cancer Mother    Lung cancer Mother    Heart attack Father     ROS: no fevers or chills, productive cough, hemoptysis, dysphasia, odynophagia, melena, hematochezia, dysuria, hematuria, rash, seizure activity, orthopnea, PND, pedal edema, claudication. Remaining systems are negative.  Physical Exam: Well-developed well-nourished in no acute distress.  Skin is warm and dry.  HEENT is normal.  Neck is supple.  Chest is clear to auscultation with normal expansion.  Cardiovascular exam is irregular Abdominal exam nontender or distended. No masses palpated. Extremities show trace edema. neuro grossly intact  EKG Interpretation Date/Time:  Tuesday May 12 2023 08:15:43 EDT Ventricular Rate:  79 PR Interval:    QRS Duration:  130 QT Interval:  426 QTC Calculation: 488 R Axis:   -12  Text Interpretation: Atrial flutter When compared with ECG of 22-Apr-2023 10:26, Confirmed by Olga Millers ([id number]) on 05/12/2023 8:17:15 AM     A/P  1 paroxysmal atrial fibrillation-patient is in atrial flutter today.  Note he had been on chronic amiodarone when his recent cardioversion was performed.  I am therefore hesitant to repeat that procedure for now as it is not clear to me that he will hold sinus.  I will ask for him to be seen to see if he is a candidate for ablation.  Will continue apixaban, carvedilol and amiodarone for now.  Patient is symptomatic with his atrial fibrillation/flutter with increased fatigue and dyspnea  on exertion.    2 nonischemic cardiomyopathy-will continue hydralazine/nitrates as patient has an allergy to ACE inhibitors/ARBs.  Continue carvedilol.  Previously evaluated for revision of his ICD but decision made not to pursue.  However patient has now had 2 separate "dizzy spells".  Etiology unclear but description is concerning.  I have asked him not to drive for now.  When he sees one of our electrophysiologists for consideration of atrial fibrillation/flutter ablation I would also like for them to consider replacing ICD generator.  3 chronic systolic congestive heart failure- Continue spironolactone.  Will also provide Lasix 20 mg daily as needed.  He previously declined SGLT2 inhibitor due to expense.  However now he is agreeable.  Will resume Jardiance 10 mg daily.  Check potassium and renal function in 1 week.  Symptomatically he is worse despite medical therapy.  I will arrange for him to be seen in the advanced heart failure clinic.  Repeat echocardiogram.  4 PVCs-this is felt possibly a contributing factor to his cardiomyopathy.  Continue amiodarone and carvedilol.  5 thoracic aortic aneurysm-plan follow-up March CT 2025.  6 hypertension-patient's blood pressure is controlled.  Continue present medications.  7 hyperlipidemia-continue statin.  8 lung nodule-plan follow-up CT March 2025.  Olga Millers, MD

## 2023-05-12 ENCOUNTER — Encounter: Payer: Self-pay | Admitting: Cardiology

## 2023-05-12 ENCOUNTER — Ambulatory Visit: Payer: Medicare Other | Attending: Cardiology | Admitting: Cardiology

## 2023-05-12 VITALS — BP 112/72 | HR 80 | Ht 68.0 in | Wt 165.0 lb

## 2023-05-12 DIAGNOSIS — I5022 Chronic systolic (congestive) heart failure: Secondary | ICD-10-CM

## 2023-05-12 DIAGNOSIS — I4819 Other persistent atrial fibrillation: Secondary | ICD-10-CM | POA: Diagnosis not present

## 2023-05-12 DIAGNOSIS — I10 Essential (primary) hypertension: Secondary | ICD-10-CM

## 2023-05-12 DIAGNOSIS — I493 Ventricular premature depolarization: Secondary | ICD-10-CM

## 2023-05-12 DIAGNOSIS — I428 Other cardiomyopathies: Secondary | ICD-10-CM

## 2023-05-12 DIAGNOSIS — E785 Hyperlipidemia, unspecified: Secondary | ICD-10-CM

## 2023-05-12 MED ORDER — EMPAGLIFLOZIN 10 MG PO TABS
10.0000 mg | ORAL_TABLET | Freq: Every day | ORAL | 0 refills | Status: AC
Start: 2023-05-12 — End: ?

## 2023-05-12 MED ORDER — EMPAGLIFLOZIN 10 MG PO TABS
10.0000 mg | ORAL_TABLET | Freq: Every day | ORAL | 11 refills | Status: DC
Start: 2023-05-12 — End: 2023-05-12

## 2023-05-12 NOTE — Addendum Note (Signed)
Addended by: Freddi Starr on: 05/12/2023 10:33 AM   Modules accepted: Orders

## 2023-05-12 NOTE — Patient Instructions (Signed)
Medication Instructions:   START JARDIANCE 10 MG ONCE DAILY  *If you need a refill on your cardiac medications before your next appointment, please call your pharmacy*   Lab Work:  Your physician recommends that you return for lab work in: ONE WEEK-DO NOT NEED TO FAST  If you have labs (blood work) drawn today and your tests are completely normal, you will receive your results only by: MyChart Message (if you have MyChart) OR A paper copy in the mail If you have any lab test that is abnormal or we need to change your treatment, we will call you to review the results.   Testing/Procedures:  Your physician has requested that you have an echocardiogram. Echocardiography is a painless test that uses sound waves to create images of your heart. It provides your doctor with information about the size and shape of your heart and how well your heart's chambers and valves are working. This procedure takes approximately one hour. There are no restrictions for this procedure. Please do NOT wear cologne, perfume, aftershave, or lotions (deodorant is allowed). Please arrive 15 minutes prior to your appointment time. [address]   Follow-Up: At [facility], you and your health needs are our priority.  As part of our continuing mission to provide you with exceptional heart care, we have created designated Provider Care Teams.  These Care Teams include your primary Cardiologist (physician) and Advanced Practice Providers (APPs -  Physician Assistants and Nurse Practitioners) who all work together to provide you with the care you need, when you need it.  We recommend signing up for the patient portal called "MyChart".  Sign up information is provided on this After Visit Summary.  MyChart is used to connect with patients for Virtual Visits (Telemedicine).  Patients are able to view lab/test results, encounter notes, upcoming appointments, etc.  Non-urgent messages can be sent to your  provider as well.   To learn more about what you can do with MyChart, go to [email or web address].    Your next appointment:   3 month(s)  Provider:   Olga Millers, MD

## 2023-05-14 ENCOUNTER — Emergency Department: Payer: Medicare Other

## 2023-05-14 ENCOUNTER — Inpatient Hospital Stay: Payer: Medicare Other

## 2023-05-14 ENCOUNTER — Telehealth: Payer: Self-pay | Admitting: Cardiology

## 2023-05-14 ENCOUNTER — Other Ambulatory Visit: Payer: Self-pay

## 2023-05-14 ENCOUNTER — Inpatient Hospital Stay
Admission: EM | Admit: 2023-05-14 | Discharge: 2023-05-15 | DRG: 291 | Disposition: A | Discharge status: Other Acute Inpatient Hospital | Payer: Medicare Other | Attending: Hospitalist | Admitting: Hospitalist

## 2023-05-14 DIAGNOSIS — I509 Heart failure, unspecified: Secondary | ICD-10-CM

## 2023-05-14 DIAGNOSIS — Z91014 Allergy to mammalian meats: Secondary | ICD-10-CM | POA: Diagnosis not present

## 2023-05-14 DIAGNOSIS — Z87891 Personal history of nicotine dependence: Secondary | ICD-10-CM

## 2023-05-14 DIAGNOSIS — K219 Gastro-esophageal reflux disease without esophagitis: Secondary | ICD-10-CM | POA: Diagnosis present

## 2023-05-14 DIAGNOSIS — I4819 Other persistent atrial fibrillation: Secondary | ICD-10-CM | POA: Diagnosis present

## 2023-05-14 DIAGNOSIS — I482 Chronic atrial fibrillation, unspecified: Secondary | ICD-10-CM | POA: Diagnosis not present

## 2023-05-14 DIAGNOSIS — N1832 Chronic kidney disease, stage 3b: Secondary | ICD-10-CM | POA: Diagnosis present

## 2023-05-14 DIAGNOSIS — E43 Unspecified severe protein-calorie malnutrition: Secondary | ICD-10-CM | POA: Diagnosis not present

## 2023-05-14 DIAGNOSIS — N179 Acute kidney failure, unspecified: Secondary | ICD-10-CM | POA: Diagnosis present

## 2023-05-14 DIAGNOSIS — J810 Acute pulmonary edema: Secondary | ICD-10-CM | POA: Diagnosis present

## 2023-05-14 DIAGNOSIS — R7989 Other specified abnormal findings of blood chemistry: Secondary | ICD-10-CM | POA: Insufficient documentation

## 2023-05-14 DIAGNOSIS — Z8262 Family history of osteoporosis: Secondary | ICD-10-CM

## 2023-05-14 DIAGNOSIS — I498 Other specified cardiac arrhythmias: Secondary | ICD-10-CM | POA: Diagnosis not present

## 2023-05-14 DIAGNOSIS — Z86010 Personal history of colonic polyps: Secondary | ICD-10-CM

## 2023-05-14 DIAGNOSIS — J45909 Unspecified asthma, uncomplicated: Secondary | ICD-10-CM | POA: Diagnosis present

## 2023-05-14 DIAGNOSIS — E785 Hyperlipidemia, unspecified: Secondary | ICD-10-CM | POA: Diagnosis present

## 2023-05-14 DIAGNOSIS — K761 Chronic passive congestion of liver: Secondary | ICD-10-CM | POA: Diagnosis present

## 2023-05-14 DIAGNOSIS — M810 Age-related osteoporosis without current pathological fracture: Secondary | ICD-10-CM | POA: Diagnosis present

## 2023-05-14 DIAGNOSIS — Z886 Allergy status to analgesic agent status: Secondary | ICD-10-CM

## 2023-05-14 DIAGNOSIS — I2489 Other forms of acute ischemic heart disease: Secondary | ICD-10-CM | POA: Diagnosis present

## 2023-05-14 DIAGNOSIS — Z96653 Presence of artificial knee joint, bilateral: Secondary | ICD-10-CM | POA: Diagnosis present

## 2023-05-14 DIAGNOSIS — Z8249 Family history of ischemic heart disease and other diseases of the circulatory system: Secondary | ICD-10-CM

## 2023-05-14 DIAGNOSIS — Z7901 Long term (current) use of anticoagulants: Secondary | ICD-10-CM

## 2023-05-14 DIAGNOSIS — Z888 Allergy status to other drugs, medicaments and biological substances status: Secondary | ICD-10-CM

## 2023-05-14 DIAGNOSIS — Z961 Presence of intraocular lens: Secondary | ICD-10-CM | POA: Diagnosis present

## 2023-05-14 DIAGNOSIS — I959 Hypotension, unspecified: Secondary | ICD-10-CM | POA: Diagnosis not present

## 2023-05-14 DIAGNOSIS — R7401 Elevation of levels of liver transaminase levels: Secondary | ICD-10-CM | POA: Diagnosis not present

## 2023-05-14 DIAGNOSIS — N189 Chronic kidney disease, unspecified: Secondary | ICD-10-CM

## 2023-05-14 DIAGNOSIS — I5023 Acute on chronic systolic (congestive) heart failure: Secondary | ICD-10-CM | POA: Diagnosis not present

## 2023-05-14 DIAGNOSIS — I5043 Acute on chronic combined systolic (congestive) and diastolic (congestive) heart failure: Secondary | ICD-10-CM | POA: Diagnosis present

## 2023-05-14 DIAGNOSIS — Z7984 Long term (current) use of oral hypoglycemic drugs: Secondary | ICD-10-CM

## 2023-05-14 DIAGNOSIS — I428 Other cardiomyopathies: Secondary | ICD-10-CM | POA: Diagnosis present

## 2023-05-14 DIAGNOSIS — I4892 Unspecified atrial flutter: Secondary | ICD-10-CM | POA: Diagnosis not present

## 2023-05-14 DIAGNOSIS — I483 Typical atrial flutter: Principal | ICD-10-CM

## 2023-05-14 DIAGNOSIS — Z9049 Acquired absence of other specified parts of digestive tract: Secondary | ICD-10-CM

## 2023-05-14 DIAGNOSIS — I130 Hypertensive heart and chronic kidney disease with heart failure and stage 1 through stage 4 chronic kidney disease, or unspecified chronic kidney disease: Principal | ICD-10-CM | POA: Diagnosis present

## 2023-05-14 DIAGNOSIS — Z515 Encounter for palliative care: Secondary | ICD-10-CM | POA: Diagnosis not present

## 2023-05-14 DIAGNOSIS — Z79899 Other long term (current) drug therapy: Secondary | ICD-10-CM

## 2023-05-14 DIAGNOSIS — Z9842 Cataract extraction status, left eye: Secondary | ICD-10-CM

## 2023-05-14 DIAGNOSIS — Z7189 Other specified counseling: Secondary | ICD-10-CM | POA: Diagnosis not present

## 2023-05-14 DIAGNOSIS — R570 Cardiogenic shock: Secondary | ICD-10-CM | POA: Diagnosis not present

## 2023-05-14 DIAGNOSIS — E872 Acidosis, unspecified: Secondary | ICD-10-CM | POA: Diagnosis present

## 2023-05-14 DIAGNOSIS — I4891 Unspecified atrial fibrillation: Secondary | ICD-10-CM | POA: Diagnosis not present

## 2023-05-14 DIAGNOSIS — I5021 Acute systolic (congestive) heart failure: Secondary | ICD-10-CM | POA: Diagnosis not present

## 2023-05-14 HISTORY — DX: Other complications of anesthesia, initial encounter: T88.59XA

## 2023-05-14 LAB — COMPREHENSIVE METABOLIC PANEL
ALT: 138 U/L — ABNORMAL HIGH (ref 0–44)
AST: 235 U/L — ABNORMAL HIGH (ref 15–41)
Albumin: 3.1 g/dL — ABNORMAL LOW (ref 3.5–5.0)
Alkaline Phosphatase: 125 U/L (ref 38–126)
Anion gap: 14 (ref 5–15)
BUN: 24 mg/dL — ABNORMAL HIGH (ref 8–23)
CO2: 18 mmol/L — ABNORMAL LOW (ref 22–32)
Calcium: 8.4 mg/dL — ABNORMAL LOW (ref 8.9–10.3)
Chloride: 102 mmol/L (ref 98–111)
Creatinine, Ser: 2.25 mg/dL — ABNORMAL HIGH (ref 0.61–1.24)
GFR, Estimated: 30 mL/min — ABNORMAL LOW (ref >60)
Glucose, Bld: 127 mg/dL — ABNORMAL HIGH (ref 70–99)
Potassium: 5 mmol/L (ref 3.5–5.1)
Sodium: 134 mmol/L — ABNORMAL LOW (ref 135–145)
Total Bilirubin: 2.1 mg/dL — ABNORMAL HIGH (ref 0.3–1.2)
Total Protein: 6.4 g/dL — ABNORMAL LOW (ref 6.5–8.1)

## 2023-05-14 LAB — BLOOD GAS, VENOUS
Acid-base deficit: 7.3 mmol/L — ABNORMAL HIGH (ref 0.0–2.0)
Bicarbonate: 18.8 mmol/L — ABNORMAL LOW (ref 20.0–28.0)
O2 Saturation: 49.6 %
Patient temperature: 37
pCO2, Ven: 39 mmHg — ABNORMAL LOW (ref 44–60)
pH, Ven: 7.29 (ref 7.25–7.43)
pO2, Ven: 35 mmHg (ref 32–45)

## 2023-05-14 LAB — BASIC METABOLIC PANEL
Anion gap: 14 (ref 5–15)
BUN: 27 mg/dL — ABNORMAL HIGH (ref 8–23)
CO2: 18 mmol/L — ABNORMAL LOW (ref 22–32)
Calcium: 8.3 mg/dL — ABNORMAL LOW (ref 8.9–10.3)
Chloride: 102 mmol/L (ref 98–111)
Creatinine, Ser: 2.32 mg/dL — ABNORMAL HIGH (ref 0.61–1.24)
GFR, Estimated: 29 mL/min — ABNORMAL LOW (ref >60)
Glucose, Bld: 104 mg/dL — ABNORMAL HIGH (ref 70–99)
Potassium: 4.3 mmol/L (ref 3.5–5.1)
Sodium: 134 mmol/L — ABNORMAL LOW (ref 135–145)

## 2023-05-14 LAB — TROPONIN I (HIGH SENSITIVITY)
Troponin I (High Sensitivity): 44 ng/L — ABNORMAL HIGH (ref <18)
Troponin I (High Sensitivity): 46 ng/L — ABNORMAL HIGH (ref <18)
Troponin I (High Sensitivity): 71 ng/L — ABNORMAL HIGH (ref <18)
Troponin I (High Sensitivity): 53 ng/L — ABNORMAL HIGH (ref <18)
Troponin I (High Sensitivity): 82 ng/L — ABNORMAL HIGH (ref <18)

## 2023-05-14 LAB — CBC WITH DIFFERENTIAL/PLATELET
Abs Immature Granulocytes: 0.03 10*3/uL (ref 0.00–0.07)
Basophils Absolute: 0.1 10*3/uL (ref 0.0–0.1)
Basophils Relative: 1 %
Eosinophils Absolute: 0 10*3/uL (ref 0.0–0.5)
Eosinophils Relative: 1 %
HCT: 40.4 % (ref 39.0–52.0)
Hemoglobin: 13 g/dL (ref 13.0–17.0)
Immature Granulocytes: 0 %
Lymphocytes Relative: 14 %
Lymphs Abs: 1 10*3/uL (ref 0.7–4.0)
MCH: 32.1 pg (ref 26.0–34.0)
MCHC: 32.2 g/dL (ref 30.0–36.0)
MCV: 99.8 fL (ref 80.0–100.0)
Monocytes Absolute: 0.7 10*3/uL (ref 0.1–1.0)
Monocytes Relative: 10 %
Neutro Abs: 5.8 10*3/uL (ref 1.7–7.7)
Neutrophils Relative %: 74 %
Platelets: 267 10*3/uL (ref 150–400)
RBC: 4.05 MIL/uL — ABNORMAL LOW (ref 4.22–5.81)
RDW: 13.6 % (ref 11.5–15.5)
WBC: 7.7 10*3/uL (ref 4.0–10.5)
nRBC: 0 % (ref 0.0–0.2)

## 2023-05-14 LAB — ETHANOL: Alcohol, Ethyl (B): <10 mg/dL (ref <10)

## 2023-05-14 LAB — BRAIN NATRIURETIC PEPTIDE: B Natriuretic Peptide: 4176.1 pg/mL — ABNORMAL HIGH (ref 0.0–100.0)

## 2023-05-14 LAB — ACETAMINOPHEN LEVEL: Acetaminophen (Tylenol), Serum: 10 ug/mL (ref 10–30)

## 2023-05-14 MED ORDER — ONDANSETRON HCL 4 MG/2ML IJ SOLN
4.0000 mg | Freq: Four times a day (QID) | INTRAMUSCULAR | Status: DC | PRN
  Administered 2023-05-14: 4 mg via INTRAVENOUS
  Filled 2023-05-14: qty 2

## 2023-05-14 MED ORDER — PANTOPRAZOLE SODIUM 40 MG IV SOLR
40.0000 mg | Freq: Two times a day (BID) | INTRAVENOUS | Status: DC
  Administered 2023-05-14 – 2023-05-15 (×2): 40 mg via INTRAVENOUS
  Filled 2023-05-14 (×2): qty 10

## 2023-05-14 MED ORDER — ATORVASTATIN CALCIUM 10 MG PO TABS
10.0000 mg | ORAL_TABLET | Freq: Every day | ORAL | Status: DC
  Administered 2023-05-15: 10 mg via ORAL
  Filled 2023-05-14: qty 1

## 2023-05-14 MED ORDER — EMPAGLIFLOZIN 10 MG PO TABS: 10.0000 mg | ORAL_TABLET | Freq: Every day | ORAL | Status: DC

## 2023-05-14 MED ORDER — SODIUM CHLORIDE 0.9% FLUSH: 3.0000 mL | INTRAVENOUS | Status: DC | PRN

## 2023-05-14 MED ORDER — ONDANSETRON HCL 4 MG PO TABS: 4.0000 mg | ORAL_TABLET | Freq: Four times a day (QID) | ORAL | Status: DC | PRN

## 2023-05-14 MED ORDER — ENSURE ENLIVE PO LIQD
237.0000 mL | Freq: Two times a day (BID) | ORAL | Status: DC
  Administered 2023-05-15: 237 mL via ORAL

## 2023-05-14 MED ORDER — SODIUM CHLORIDE 0.9 % IV SOLN: 250.0000 mL | INTRAVENOUS | Status: DC | PRN

## 2023-05-14 MED ORDER — APIXABAN 5 MG PO TABS
5.0000 mg | ORAL_TABLET | Freq: Two times a day (BID) | ORAL | Status: DC
  Administered 2023-05-14 – 2023-05-15 (×2): 5 mg via ORAL
  Filled 2023-05-14 (×2): qty 1

## 2023-05-14 MED ORDER — PROCHLORPERAZINE EDISYLATE 10 MG/2ML IJ SOLN
10.0000 mg | Freq: Once | INTRAMUSCULAR | Status: AC
  Administered 2023-05-14: 10 mg via INTRAVENOUS
  Filled 2023-05-14: qty 2

## 2023-05-14 MED ORDER — AMIODARONE HCL 200 MG PO TABS
200.0000 mg | ORAL_TABLET | Freq: Every day | ORAL | Status: DC
  Administered 2023-05-14 – 2023-05-15 (×2): 200 mg via ORAL
  Filled 2023-05-14 (×2): qty 1

## 2023-05-14 MED ORDER — FUROSEMIDE 20 MG PO TABS
20.0000 mg | ORAL_TABLET | Freq: Every day | ORAL | Status: DC
  Administered 2023-05-14: 20 mg via ORAL
  Filled 2023-05-14 (×2): qty 1

## 2023-05-14 MED ORDER — SODIUM CHLORIDE 0.9 % IV BOLUS
1000.0000 mL | Freq: Once | INTRAVENOUS | Status: AC
  Administered 2023-05-14: 1000 mL via INTRAVENOUS

## 2023-05-14 MED ORDER — SODIUM CHLORIDE 0.9% FLUSH
3.0000 mL | Freq: Two times a day (BID) | INTRAVENOUS | Status: DC
  Administered 2023-05-14 – 2023-05-15 (×3): 3 mL via INTRAVENOUS

## 2023-05-14 MED ORDER — FUROSEMIDE 10 MG/ML IJ SOLN
40.0000 mg | Freq: Once | INTRAMUSCULAR | Status: AC
  Administered 2023-05-14: 40 mg via INTRAVENOUS
  Filled 2023-05-14: qty 4

## 2023-05-14 NOTE — ED Notes (Signed)
Gave pt po, tolerating well. He's stated that he hasn't had much of an appetite lately encouraged po.

## 2023-05-14 NOTE — Assessment & Plan Note (Signed)
Troponin 40s in setting of acute on chronic HFpEF and atrial fibrillation No active chest pain Suspect trace demand ischemia Continue home regimen including Eliquis Follow-up cardiology recommendations

## 2023-05-14 NOTE — Progress Notes (Signed)
Contacted by nurse and message about Trop (delta) went from 46 to 71. Pt also experiencing nausea and dry heaving after walking to the bathroom. He also states he felt a little short of breath as well, spo2 is at 98 . Pt is admitted for CHF.  We will repeat TNI EKG is similar to previous.  Will follow mag and replace and cont with diuresis.  No RUQ pain, pain in left side of lower chest .  We will follow.

## 2023-05-14 NOTE — Assessment & Plan Note (Signed)
AST 235, ALT 138 T. bili 2.1 Will check hepatitis panel and tylenol level to correlate Right upper quadrant ultrasound Monitor

## 2023-05-14 NOTE — ED Notes (Addendum)
Pt educated he needs to be less mobile pt agreeable at this time. EKG uploaded. RN talked with provider see orders

## 2023-05-14 NOTE — Telephone Encounter (Signed)
Spoke with pt's wife Revonda Standard.  Reviewed chart and reported symptoms. Referred pt to ER.

## 2023-05-14 NOTE — ED Notes (Signed)
Provider notified pt is still nauseous and requested something for nausea.

## 2023-05-14 NOTE — Telephone Encounter (Signed)
STAT if patient feels like he/she is going to faint   1. Are you feeling dizzy, lightheaded, or faint right now? Per wife, pt states that he feels very close to passing out    2. Have you passed out?  no (If yes move to .SYNCOPECHMG)   3. Do you have any other symptoms? Some shortness of breath, nausea, vomitting and no appetite   4. Have you checked your HR and BP (record if available)? BP: 90/60 last night and 100/70 this morning with irregular HR

## 2023-05-14 NOTE — H&P (Addendum)
History and Physical    Patient: [name] ZOX:[id number] DOB: Jun 19, 1946 DOA: 05/14/2023 DOS: the patient was seen and examined on 05/14/2023 PCP: [name], MD  Patient coming from: Home  Chief Complaint:  Chief Complaint  Patient presents with   Weakness    Patient presents with complaints of progressively worsening weakness and dyspnea with exertion that began approx. 3 weeks ago; He has a significant cardiac history including unstable A-fib with RVR (treated with cardioversion and planned ablations)   HPI: [name] is a 77 y.o. male with medical history significant of HFpEF, stage III CKD, atrial fibrillation, PVCs, GERD presenting with acute on chronic HFpEF, acute on chronic kidney disease.  Patient with noted extensive cardiac history.  Has had progressive shortness of breath over multiple weeks.  Noted of had a cardioversion done with cardiology on August 20.  Per the patient, symptoms have persisted.  Patient also with noted fall on the left side on the same day of the cardioversion per patient.  No reported loss of consciousness.  Was recently seen by [name] on September 24 with plan for ablative procedure for atrial fibrillation.  Patient says she has had worsening orthopnea and PND.  No frank chest pain.  Positive generalized weakness.  No abdominal pain, nausea or vomiting.  No focal hemiparesis or confusion.  States that he was given a diuretic at last cardiology evaluation.  However, patient has not taken the medication as of yet.  No reported NSAID or high salt intake. Presented to the ER afebrile, hemodynamically stable.  White count 7.7, hemoglobin 13, platelets 267, troponin in the 40s, creatinine 2.25, bicarb of 18, AST 235, ALT 138, T. bili 2.1.  Chest x-ray consistent with heart failure and pulmonary edema.  In conversation with Dr. Vicente Males, Dr. Mariah Milling with cardiology has ready been consulted. Review of Systems: As mentioned in the history of present  illness. All other systems reviewed and are negative. Past Medical History:  Diagnosis Date   Abnormal chest CT    Anaphylactic reaction    Anxiety    Asthma    Benign hypertension    Cardiomyopathy    Cardiomyopathy, idiopathic (HCC)    CHF (congestive heart failure) (HCC)    Chronic renal insufficiency    With creatinine 1.4   Drug-induced urticaria    FH: thoracic aortic aneurysm    GERD (gastroesophageal reflux disease)    History of colon polyps    Hives    Long Hx of hives   Hyperlipidemia    Hyperlipidemia    IMPLANTATION OF DEFIBRILLATOR, HX OF 02/15/2009   Qualifier: Diagnosis of  By: Jens Som, MD, Lyn Hollingshead    NSVT (nonsustained ventricular tachycardia) (HCC)    Osteoarthritis    Osteoarthrosis, unspecified whether generalized or localized, lower leg    Osteoporosis    Oxygen deficiency    Psoriasis    PVC (premature ventricular contraction)    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   Past Surgical History:  Procedure Laterality Date   APPENDECTOMY     BIV ICD GENERTAOR CHANGE OUT N/A 10/08/2012   Procedure: BIV ICD GENERTAOR CHANGE OUT;  Surgeon: Marinus Maw, MD;  Location: [facility] CATH LAB;  Service: Cardiovascular;  Laterality: N/A;   CARDIAC CATHETERIZATION     CARDIAC DEFIBRILLATOR REMOVAL  2014   CARDIOVERSION N/A 04/07/2023   Procedure: CARDIOVERSION;  Surgeon: Chilton Si, MD;  Location: [facility] INVASIVE CV LAB;  Service: Cardiovascular;  Laterality: N/A;  CATARACT EXTRACTION     CATARACT EXTRACTION W/PHACO Left 03/12/2016   Procedure: CATARACT EXTRACTION PHACO AND INTRAOCULAR LENS PLACEMENT (IOC);  Surgeon: Sallee Lange, MD;  Location: [facility] ORS;  Service: Ophthalmology;  Laterality: Left;  [place] 01:32 AP% 25.7 CDE 41.33 fluid pack lot # [id number]   COLONOSCOPY WITH PROPOFOL N/A 04/26/2019   Procedure: COLONOSCOPY WITH PROPOFOL;  Surgeon: Christena Deem, MD;  Location: [facility] ENDOSCOPY;  Service: Endoscopy;  Laterality: N/A;    ESOPHAGOGASTRODUODENOSCOPY     EYE SURGERY     Implantation of dual-chamber ICD.     [employer] DDD-ICD 1/07   JOINT REPLACEMENT Right 08/16/2009   KNEE ARTHROPLASTY Left 2006   KNEE ARTHROPLASTY Right 12/10   x2   LAPAROSCOPIC APPENDECTOMY N/A 08/12/2018   Procedure: APPENDECTOMY LAPAROSCOPIC;  Surgeon: Henrene Dodge, MD;  Location: [facility] ORS;  Service: General;  Laterality: N/A;   TONSILLECTOMY     Social History:  reports that he quit smoking about 52 years ago. His smoking use included cigarettes. He started smoking about 62 years ago. He has a 10 pack-year smoking history. He has never used smokeless tobacco. He reports current alcohol use. He reports that he does not use drugs.  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   Pork-Derived Products Anaphylaxis   Ace Inhibitors Other (See Comments)    Other Reaction: Other reaction/ Urticaria secondary to COX-1 inhibitors Angioedema   Aspirin Other (See Comments)    Other Reaction: Other reaction   Indocin [Indomethacin] Other (See Comments)   Lactase-Lactobacillus Other (See Comments)   Lisinopril Other (See Comments)   Mobic [Meloxicam] Other (See Comments)   Naproxen Other (See Comments)   Nsaids Other (See Comments)    Other Reaction: Other reaction     Family History  Problem Relation Age of Onset   Cancer Mother    Lung cancer Mother    Heart attack Father     Prior to Admission medications   Medication Sig Start Date End Date Taking? Authorizing Provider  furosemide (LASIX) 20 MG tablet Take 20 mg by mouth daily as needed for edema. 05/13/23 05/12/24 Yes [provider]  hydrALAZINE (APRESOLINE) 25 MG tablet Take 12.5 mg by mouth in the morning and at bedtime. 05/13/23 05/12/24 Yes [provider]  amiodarone (PACERONE) 200 MG tablet TAKE 1 TABLET BY MOUTH EVERY DAY 04/22/23   Lewayne Bunting, MD  apixaban (ELIQUIS) 5 MG TABS tablet Take 1 tablet (5 mg  total) by mouth 2 (two) times daily. 03/11/23   Carlos Levering, NP  atorvastatin (LIPITOR) 10 MG tablet Take 1 tablet (10 mg total) by mouth daily. 12/23/21   Lewayne Bunting, MD  carvedilol (COREG) 6.25 MG tablet TAKE 1 TABLET BY MOUTH TWICE A DAY 03/19/23   Lewayne Bunting, MD  cetirizine (ZYRTEC) 10 MG tablet Take 10 mg by mouth daily.    [provider]  empagliflozin (JARDIANCE) 10 MG TABS tablet Take 1 tablet (10 mg total) by mouth daily before breakfast. 05/12/23   Lewayne Bunting, MD  EPINEPHrine 0.3 mg/0.3 mL IJ SOAJ injection Inject 0.3 mg into the muscle as needed for anaphylaxis.    [provider]  famotidine (PEPCID) 20 MG tablet Take 20 mg by mouth daily. 11/02/18   [provider]  hydrALAZINE (APRESOLINE) 50 MG tablet Take 0.5 tablets (25 mg total) by mouth 2 (two) times daily. Take 1/2 tablet BID 11/04/22   Lewayne Bunting,  MD  isosorbide mononitrate (IMDUR) 30 MG 24 hr tablet Take 1 tablet (30 mg total) by mouth daily. 03/21/13 05/12/23  Lewayne Bunting, MD  Menthol-Methyl Salicylate (SALONPAS PAIN RELIEF PATCH) 3-10 % PTCH Apply 1 application  topically daily.    [provider]  predniSONE (DELTASONE) 10 MG tablet Take 1 tablet (10 mg total) by mouth as directed. 04/07/23   Cuthriell, Delorise Royals, PA-C  spironolactone (ALDACTONE) 25 MG tablet TAKE 1/2 TABLET BY MOUTH DAILY 03/19/23   Lewayne Bunting, MD    Physical Exam: Vitals:   05/14/23 1145 05/14/23 1200 05/14/23 1230 05/14/23 1300  BP: 107/75 117/80 112/82 114/85  Pulse: 88 82 84 87  Resp: (!) 49 (!) 32 (!) 31 (!) 25  Temp:      TempSrc:      SpO2: 100% 99% 100% 100%  Weight:      Height:       Physical Exam Constitutional:      Appearance: He is normal weight.  HENT:     Head: Normocephalic and atraumatic.     Nose: Nose normal.     Mouth/Throat:     Mouth: Mucous membranes are moist.  Eyes:     Pupils: Pupils are equal, round, and reactive to light.  Cardiovascular:      Rate and Rhythm: Normal rate and regular rhythm.  Pulmonary:     Effort: Pulmonary effort is normal.  Abdominal:     General: Bowel sounds are normal.  Musculoskeletal:        General: Normal range of motion.  Skin:    General: Skin is warm.  Neurological:     General: No focal deficit present.     Data Reviewed:  There are no new results to review at this time.  DG Chest Port 1 View CLINICAL DATA:  Shortness of breath. Weakness and dyspnea on exertion.  EXAM: PORTABLE CHEST 1 VIEW  COMPARISON:  04/07/2023.  FINDINGS: Mildly increased interstitial markings with lower lobe gradient. There is also mild central vascular congestion. Findings favor congestive heart failure/pulmonary edema. There are probable atelectatic changes at the lung bases. No dense consolidation or major lung collapse. Bilateral lateral costophrenic angles are clear.  Stable moderately enlarged cardio-mediastinal silhouette.  Aortic arch calcifications noted.  Abandoned pacemaker leads noted.  No acute osseous abnormalities.  The soft tissues are within normal limits.  IMPRESSION: Findings favor congestive heart failure/pulmonary edema.  Electronically Signed   By: Jules Schick M.D.   On: 05/14/2023 11:59  Lab Results  Component Value Date   WBC 7.7 05/14/2023   HGB 13.0 05/14/2023   HCT 40.4 05/14/2023   MCV 99.8 05/14/2023   PLT 267 05/14/2023   Last metabolic panel Lab Results  Component Value Date   GLUCOSE 127 (H) 05/14/2023   NA 134 (L) 05/14/2023   K 5.0 05/14/2023   CL 102 05/14/2023   CO2 18 (L) 05/14/2023   BUN 24 (H) 05/14/2023   CREATININE 2.25 (H) 05/14/2023   GFRNONAA 30 (L) 05/14/2023   CALCIUM 8.4 (L) 05/14/2023   PROT 6.4 (L) 05/14/2023   ALBUMIN 3.1 (L) 05/14/2023   LABGLOB 2.2 01/05/2018   AGRATIO 1.6 01/05/2018   BILITOT 2.1 (H) 05/14/2023   ALKPHOS 125 05/14/2023   AST 235 (H) 05/14/2023   ALT 138 (H) 05/14/2023   ANIONGAP 14 05/14/2023     Assessment and Plan: Acute on chronic HFrEF (heart failure with reduced ejection fraction) [facility]) 2D echo March 2024 with a  EF of 25 to 30% and grade 2 diastolic dysfunction Acute decompensation with noted orthopnea, PND over multiple days BNP 4200 Checks are consistent with volume overload IV Lasix In discussion w/ Dr. Vicente Males, cardiology consulted in the ER-follow-up recommendations  Persistent atrial fibrillation (HCC) Baseline paroxysmal atrial fibrillation with noted recent cardioversion on August 20 and persistence of symptoms Seen by [name] in the outpatient setting-plan for outpatient ablation evaluation with cardiac EP Pending formal consultation by cardiology in the ER in setting of acute on chronic HFPEF; would likely plan for expedited EP evaluation Rate controlled 80s at present Continue home regimen including amiodarone, coreg  and Eliquis pending cardiology evaluation  Acute kidney injury superimposed on chronic kidney disease (HCC) Baseline creatinine around 1.2-1.7 with GFR in the 40s Creatinine 2.2 today with GFR 30 Noted bicarb of 18-checking VBG to correlate for metabolic acidosis Some question of cardiorenal syndrome Will monitor renal function with diuresis Nephrology consult as clinically indicated   Elevated troponin Troponin 40s in setting of acute on chronic HFpEF and atrial fibrillation No active chest pain Suspect trace demand ischemia Continue home regimen including Eliquis Follow-up cardiology recommendations  Transaminitis AST 235, ALT 138 T. bili 2.1 Will check hepatitis panel and tylenol level to correlate Right upper quadrant ultrasound Monitor   Greater than 50% was spent in counseling and coordination of care with patient Total encounter time 80 minutes or more    Advance Care Planning:   Code Status: Full Code   Consults: Cardiology   Family Communication: Wife at the bedside   Severity of Illness: The appropriate  patient status for this patient is INPATIENT. Inpatient status is judged to be reasonable and necessary in order to provide the required intensity of service to ensure the patient's safety. The patient's presenting symptoms, physical exam findings, and initial radiographic and laboratory data in the context of their chronic comorbidities is felt to place them at high risk for further clinical deterioration. Furthermore, it is not anticipated that the patient will be medically stable for discharge from the hospital within 2 midnights of admission.   * I certify that at the point of admission it is my clinical judgment that the patient will require inpatient hospital care spanning beyond 2 midnights from the point of admission due to high intensity of service, high risk for further deterioration and high frequency of surveillance required.*  Author: Floydene Flock, MD 05/14/2023 1:13 PM  For on call review [email or web address].

## 2023-05-14 NOTE — ED Notes (Signed)
Still awaiting orders for nausea med.

## 2023-05-14 NOTE — ED Provider Notes (Signed)
[facility] Provider Note   Event Date/Time   First MD Initiated Contact with Patient 05/14/23 [phone]     (approximate) History  Weakness (Patient presents with complaints of progressively worsening weakness and dyspnea with exertion that began approx. 3 weeks ago; He has a significant cardiac history including unstable A-fib with RVR (treated with cardioversion and planned ablations))  HPI [name] is a 77 y.o. male with a past medical history of paroxysmal atrial fibrillation, cardiomyopathy, and CHF who presents via EMS for persistent lightheadedness, palpitations, and shortness of breath as well as dyspnea on exertion.  Patient states that the symptoms have been worsening over the last week despite gait being given medications including Lasix. ROS: Patient currently denies any vision changes, tinnitus, difficulty speaking, facial droop, sore throat, chest pain, abdominal pain, nausea/vomiting/diarrhea, dysuria, or weakness/numbness/paresthesias in any extremity   Physical Exam  Triage Vital Signs: ED Triage Vitals  Encounter Vitals Group     BP      Systolic BP Percentile      Diastolic BP Percentile      Pulse      Resp      Temp      Temp src      SpO2      Weight      Height      Head Circumference      Peak Flow      Pain Score      Pain Loc      Pain Education      Exclude from Growth Chart    Most recent vital signs: Vitals:   05/14/23 1425 05/14/23 1430  BP:  123/86  Pulse:  85  Resp: 18 (!) 22  Temp:    SpO2:  99%   General: Awake, oriented x4. CV:  Good peripheral perfusion.  Resp:  Normal effort.  Abd:  No distention.  Other:  Elderly well-developed, well-nourished Caucasian male resting comfortably in no acute distress ED Results / Procedures / Treatments  Labs (all labs ordered are listed, but only abnormal results are displayed) Labs Reviewed  COMPREHENSIVE METABOLIC PANEL - Abnormal; Notable for the following  components:      Result Value   Sodium 134 (*)    CO2 18 (*)    Glucose, Bld 127 (*)    BUN 24 (*)    Creatinine, Ser 2.25 (*)    Calcium 8.4 (*)    Total Protein 6.4 (*)    Albumin 3.1 (*)    AST 235 (*)    ALT 138 (*)    Total Bilirubin 2.1 (*)    GFR, Estimated 30 (*)    All other components within normal limits  BRAIN NATRIURETIC PEPTIDE - Abnormal; Notable for the following components:   B Natriuretic Peptide 4,176.1 (*)    All other components within normal limits  CBC WITH DIFFERENTIAL/PLATELET - Abnormal; Notable for the following components:   RBC 4.05 (*)    All other components within normal limits  BLOOD GAS, VENOUS - Abnormal; Notable for the following components:   pCO2, Ven 39 (*)    Bicarbonate 18.8 (*)    Acid-base deficit 7.3 (*)    All other components within normal limits  TROPONIN I (HIGH SENSITIVITY) - Abnormal; Notable for the following components:   Troponin I (High Sensitivity) 44 (*)    All other components within normal limits  TROPONIN I (HIGH SENSITIVITY) - Abnormal; Notable for the following components:  Troponin I (High Sensitivity) 46 (*)    All other components within normal limits  ACETAMINOPHEN LEVEL  ETHANOL  HEPATITIS PANEL, ACUTE   EKG ED ECG REPORT I, Merwyn Katos, the attending physician, personally viewed and interpreted this ECG. Date: 05/14/2023 EKG Time: 1001 Rate: 86 Rhythm: normal sinus rhythm QRS Axis: normal Intervals: normal ST/T Wave abnormalities: normal Narrative Interpretation: no evidence of acute ischemia RADIOLOGY ED MD interpretation: Single view portable chest x-ray interpreted independently by me and shows pulmonary edema -Agree with radiology assessment Official radiology report(s): DG Chest Port 1 View  Result Date: 05/14/2023 CLINICAL DATA:  Shortness of breath. Weakness and dyspnea on exertion. EXAM: PORTABLE CHEST 1 VIEW COMPARISON:  04/07/2023. FINDINGS: Mildly increased interstitial markings with  lower lobe gradient. There is also mild central vascular congestion. Findings favor congestive heart failure/pulmonary edema. There are probable atelectatic changes at the lung bases. No dense consolidation or major lung collapse. Bilateral lateral costophrenic angles are clear. Stable moderately enlarged cardio-mediastinal silhouette. Aortic arch calcifications noted. Abandoned pacemaker leads noted. No acute osseous abnormalities. The soft tissues are within normal limits. IMPRESSION: Findings favor congestive heart failure/pulmonary edema. Electronically Signed   By: Jules Schick M.D.   On: 05/14/2023 11:59   PROCEDURES: Critical Care performed: Yes, see critical care procedure note(s) .1-3 Lead EKG Interpretation  Performed by: Merwyn Katos, MD Authorized by: Merwyn Katos, MD     Interpretation: abnormal     ECG rate:  85   ECG rate assessment: normal     Rhythm: atrial flutter     Ectopy: none     Conduction: normal   CRITICAL CARE Performed by: Merwyn Katos  Total critical care time: 35 minutes  Critical care time was exclusive of separately billable procedures and treating other patients.  Critical care was necessary to treat or prevent imminent or life-threatening deterioration.  Critical care was time spent personally by me on the following activities: development of treatment plan with patient and/or surrogate as well as nursing, discussions with consultants, evaluation of patient's response to treatment, examination of patient, obtaining history from patient or surrogate, ordering and performing treatments and interventions, ordering and review of laboratory studies, ordering and review of radiographic studies, pulse oximetry and re-evaluation of patient's condition.  MEDICATIONS ORDERED IN ED: Medications  sodium chloride flush (NS) 0.9 % injection 3 mL (3 mLs Intravenous Given 05/14/23 1426)  sodium chloride flush (NS) 0.9 % injection 3 mL (has no administration in  time range)  0.9 %  sodium chloride infusion (has no administration in time range)  ondansetron (ZOFRAN) tablet 4 mg (has no administration in time range)    Or  ondansetron (ZOFRAN) injection 4 mg (has no administration in time range)  apixaban (ELIQUIS) tablet 5 mg (has no administration in time range)  atorvastatin (LIPITOR) tablet 10 mg (has no administration in time range)  furosemide (LASIX) tablet 20 mg (has no administration in time range)  amiodarone (PACERONE) tablet 200 mg (200 mg Oral Given 05/14/23 1451)  sodium chloride 0.9 % bolus 1,000 mL (0 mLs Intravenous Stopped 05/14/23 1201)  furosemide (LASIX) injection 40 mg (40 mg Intravenous Given 05/14/23 1107)   IMPRESSION / MDM / ASSESSMENT AND PLAN / ED COURSE  I reviewed the triage vital signs and the nursing notes.                             The patient is on  the cardiac monitor to evaluate for evidence of arrhythmia and/or significant heart rate changes. Patient's presentation is most consistent with acute presentation with potential threat to life or bodily function. 77 year old male with the above-stated past medical history presents for persistent lightheadedness and shortness of breath in the setting of new onset atrial flutter + Atrial flutter DDx: Pneumothorax, Pneumonia, Pulmonary Embolus, Tamponade, ACS, Thyrotoxicosis.  No history or evidence decompensated heart failure. Given their history and exam it is likely this patient is unlikely to spontaneously revert to a rate controlled rhythm and necessitates a thorough workup for their arrhythmia. Workup: ECG, CXR, CBC, BMP, UA, Troponin, BNP, TSH, Ca-Mag-Phos Interventions: Defer Cardioversion (uncertain historical reliability with time of onset, increased risk of thromboembolic stroke).  I spoke with Dr. Mariah Milling in cardiology who did not recommend repeat cardioversion as well given the patient did not respond well the first time.  Only recommended Lasix at this time with no  IV antiarrhythmic as well  I spoke to Dr. Andrena Mews in the hospitalist service who agreed to accept this patient for further evaluation and management.  Disposition: Admit   FINAL CLINICAL IMPRESSION(S) / ED DIAGNOSES   Final diagnoses:  Typical atrial flutter (HCC)  Acute pulmonary edema (HCC)  Cardiorenal syndrome with renal failure, stage 1-4 or unspecified chronic kidney disease, with heart failure (HCC)   Rx / DC Orders   ED Discharge Orders     None      Note:  This document was prepared using Dragon voice recognition software and may include unintentional dictation errors.   Merwyn Katos, MD 05/14/23 [phone]

## 2023-05-14 NOTE — Assessment & Plan Note (Addendum)
Baseline paroxysmal atrial fibrillation with noted recent cardioversion on August 20 and persistence of symptoms Seen by [name] in the outpatient setting-plan for outpatient ablation evaluation with cardiac EP Pending formal consultation by cardiology in the ER in setting of acute on chronic HFPEF; would likely plan for expedited EP evaluation Rate controlled 80s at present Continue home regimen including amiodarone, coreg  and Eliquis pending cardiology evaluation

## 2023-05-14 NOTE — ED Notes (Signed)
Pt assisted into the recliner. PT given warm blankets. Pt denies any other needs at this time. PT has his shoes on and call light within reach

## 2023-05-14 NOTE — Assessment & Plan Note (Addendum)
2D echo March 2024 with a EF of 25 to 30% and grade 2 diastolic dysfunction Acute decompensation with noted orthopnea, PND over multiple days BNP 4200 Checks are consistent with volume overload IV Lasix In discussion w/ Dr. Vicente Males, cardiology consulted in the ER-follow-up recommendations

## 2023-05-14 NOTE — ED Notes (Signed)
PT back into bed, call light within reach

## 2023-05-14 NOTE — ED Notes (Signed)
Kylie RN messaged provider due to pt having SOB after ambulation and dry heaving. Pt nauseous and reports L side pain. EKG in process.

## 2023-05-14 NOTE — ED Triage Notes (Signed)
Patient presents with complaints of progressively worsening weakness and dyspnea with exertion that began approx. 3 weeks ago; He has a significant cardiac history including unstable A-fib with RVR (treated with cardioversion and planned ablations)

## 2023-05-14 NOTE — Assessment & Plan Note (Signed)
Baseline creatinine around 1.2-1.7 with GFR in the 40s Creatinine 2.2 today with GFR 30 Noted bicarb of 18-checking VBG to correlate for metabolic acidosis Some question of cardiorenal syndrome Will monitor renal function with diuresis Nephrology consult as clinically indicated

## 2023-05-15 ENCOUNTER — Other Ambulatory Visit: Payer: Self-pay

## 2023-05-15 ENCOUNTER — Inpatient Hospital Stay: Payer: Medicare Other

## 2023-05-15 ENCOUNTER — Encounter (HOSPITAL_COMMUNITY): Payer: Self-pay

## 2023-05-15 ENCOUNTER — Other Ambulatory Visit (HOSPITAL_COMMUNITY): Payer: Self-pay

## 2023-05-15 ENCOUNTER — Inpatient Hospital Stay (HOSPITAL_COMMUNITY)
Admission: EM | Admit: 2023-05-15 | Discharge: 2023-05-24 | DRG: 291 | Disposition: A | Discharge status: Home / Self Care | Payer: Medicare Other | Source: Other Acute Inpatient Hospital | Attending: Cardiology | Admitting: Cardiology

## 2023-05-15 ENCOUNTER — Encounter: Payer: Self-pay | Admitting: Family Medicine

## 2023-05-15 DIAGNOSIS — Z801 Family history of malignant neoplasm of trachea, bronchus and lung: Secondary | ICD-10-CM | POA: Diagnosis not present

## 2023-05-15 DIAGNOSIS — I129 Hypertensive chronic kidney disease with stage 1 through stage 4 chronic kidney disease, or unspecified chronic kidney disease: Secondary | ICD-10-CM | POA: Diagnosis present

## 2023-05-15 DIAGNOSIS — Z7901 Long term (current) use of anticoagulants: Secondary | ICD-10-CM | POA: Diagnosis not present

## 2023-05-15 DIAGNOSIS — Z91014 Allergy to mammalian meats: Secondary | ICD-10-CM

## 2023-05-15 DIAGNOSIS — Z01810 Encounter for preprocedural cardiovascular examination: Secondary | ICD-10-CM | POA: Diagnosis not present

## 2023-05-15 DIAGNOSIS — I420 Dilated cardiomyopathy: Secondary | ICD-10-CM | POA: Diagnosis present

## 2023-05-15 DIAGNOSIS — Z96653 Presence of artificial knee joint, bilateral: Secondary | ICD-10-CM | POA: Diagnosis present

## 2023-05-15 DIAGNOSIS — I5023 Acute on chronic systolic (congestive) heart failure: Secondary | ICD-10-CM | POA: Diagnosis present

## 2023-05-15 DIAGNOSIS — N179 Acute kidney failure, unspecified: Secondary | ICD-10-CM | POA: Diagnosis present

## 2023-05-15 DIAGNOSIS — E43 Unspecified severe protein-calorie malnutrition: Secondary | ICD-10-CM | POA: Diagnosis present

## 2023-05-15 DIAGNOSIS — K5900 Constipation, unspecified: Secondary | ICD-10-CM | POA: Diagnosis not present

## 2023-05-15 DIAGNOSIS — Z7401 Bed confinement status: Secondary | ICD-10-CM

## 2023-05-15 DIAGNOSIS — R570 Cardiogenic shock: Secondary | ICD-10-CM | POA: Diagnosis present

## 2023-05-15 DIAGNOSIS — I5021 Acute systolic (congestive) heart failure: Secondary | ICD-10-CM | POA: Diagnosis not present

## 2023-05-15 DIAGNOSIS — Z7984 Long term (current) use of oral hypoglycemic drugs: Secondary | ICD-10-CM

## 2023-05-15 DIAGNOSIS — E739 Lactose intolerance, unspecified: Secondary | ICD-10-CM | POA: Diagnosis present

## 2023-05-15 DIAGNOSIS — I4819 Other persistent atrial fibrillation: Secondary | ICD-10-CM | POA: Diagnosis present

## 2023-05-15 DIAGNOSIS — I4892 Unspecified atrial flutter: Secondary | ICD-10-CM | POA: Diagnosis present

## 2023-05-15 DIAGNOSIS — Z95810 Presence of automatic (implantable) cardiac defibrillator: Secondary | ICD-10-CM

## 2023-05-15 DIAGNOSIS — E872 Acidosis, unspecified: Secondary | ICD-10-CM | POA: Diagnosis present

## 2023-05-15 DIAGNOSIS — E876 Hypokalemia: Secondary | ICD-10-CM | POA: Diagnosis present

## 2023-05-15 DIAGNOSIS — Z6824 Body mass index (BMI) 24.0-24.9, adult: Secondary | ICD-10-CM

## 2023-05-15 DIAGNOSIS — R0602 Shortness of breath: Secondary | ICD-10-CM | POA: Diagnosis present

## 2023-05-15 DIAGNOSIS — Z8249 Family history of ischemic heart disease and other diseases of the circulatory system: Secondary | ICD-10-CM

## 2023-05-15 DIAGNOSIS — M1990 Unspecified osteoarthritis, unspecified site: Secondary | ICD-10-CM | POA: Diagnosis present

## 2023-05-15 DIAGNOSIS — I493 Ventricular premature depolarization: Secondary | ICD-10-CM | POA: Diagnosis present

## 2023-05-15 DIAGNOSIS — Z87891 Personal history of nicotine dependence: Secondary | ICD-10-CM

## 2023-05-15 DIAGNOSIS — E785 Hyperlipidemia, unspecified: Secondary | ICD-10-CM | POA: Diagnosis present

## 2023-05-15 DIAGNOSIS — I428 Other cardiomyopathies: Secondary | ICD-10-CM | POA: Diagnosis not present

## 2023-05-15 DIAGNOSIS — M810 Age-related osteoporosis without current pathological fracture: Secondary | ICD-10-CM | POA: Diagnosis present

## 2023-05-15 DIAGNOSIS — N139 Obstructive and reflux uropathy, unspecified: Secondary | ICD-10-CM | POA: Diagnosis not present

## 2023-05-15 DIAGNOSIS — K219 Gastro-esophageal reflux disease without esophagitis: Secondary | ICD-10-CM | POA: Diagnosis present

## 2023-05-15 DIAGNOSIS — H109 Unspecified conjunctivitis: Secondary | ICD-10-CM | POA: Diagnosis not present

## 2023-05-15 DIAGNOSIS — M7989 Other specified soft tissue disorders: Secondary | ICD-10-CM | POA: Diagnosis not present

## 2023-05-15 DIAGNOSIS — Z7189 Other specified counseling: Secondary | ICD-10-CM | POA: Diagnosis not present

## 2023-05-15 DIAGNOSIS — R7401 Elevation of levels of liver transaminase levels: Secondary | ICD-10-CM

## 2023-05-15 DIAGNOSIS — I130 Hypertensive heart and chronic kidney disease with heart failure and stage 1 through stage 4 chronic kidney disease, or unspecified chronic kidney disease: Principal | ICD-10-CM | POA: Diagnosis present

## 2023-05-15 DIAGNOSIS — I5043 Acute on chronic combined systolic (congestive) and diastolic (congestive) heart failure: Secondary | ICD-10-CM | POA: Diagnosis not present

## 2023-05-15 DIAGNOSIS — Z886 Allergy status to analgesic agent status: Secondary | ICD-10-CM

## 2023-05-15 DIAGNOSIS — N1831 Chronic kidney disease, stage 3a: Secondary | ICD-10-CM | POA: Diagnosis present

## 2023-05-15 DIAGNOSIS — Z515 Encounter for palliative care: Secondary | ICD-10-CM | POA: Diagnosis not present

## 2023-05-15 DIAGNOSIS — D6869 Other thrombophilia: Secondary | ICD-10-CM | POA: Diagnosis present

## 2023-05-15 DIAGNOSIS — Z888 Allergy status to other drugs, medicaments and biological substances status: Secondary | ICD-10-CM

## 2023-05-15 DIAGNOSIS — Z79899 Other long term (current) drug therapy: Secondary | ICD-10-CM

## 2023-05-15 DIAGNOSIS — N189 Chronic kidney disease, unspecified: Secondary | ICD-10-CM | POA: Diagnosis not present

## 2023-05-15 DIAGNOSIS — I498 Other specified cardiac arrhythmias: Secondary | ICD-10-CM | POA: Diagnosis not present

## 2023-05-15 DIAGNOSIS — K7200 Acute and subacute hepatic failure without coma: Secondary | ICD-10-CM | POA: Diagnosis present

## 2023-05-15 DIAGNOSIS — I4891 Unspecified atrial fibrillation: Secondary | ICD-10-CM | POA: Diagnosis not present

## 2023-05-15 LAB — COMPREHENSIVE METABOLIC PANEL
ALT: 2191 U/L — ABNORMAL HIGH (ref 0–44)
AST: 4521 U/L — ABNORMAL HIGH (ref 15–41)
Albumin: 3.2 g/dL — ABNORMAL LOW (ref 3.5–5.0)
Alkaline Phosphatase: 120 U/L (ref 38–126)
Anion gap: 18 — ABNORMAL HIGH (ref 5–15)
BUN: 33 mg/dL — ABNORMAL HIGH (ref 8–23)
CO2: 17 mmol/L — ABNORMAL LOW (ref 22–32)
Calcium: 8.3 mg/dL — ABNORMAL LOW (ref 8.9–10.3)
Chloride: 98 mmol/L (ref 98–111)
Creatinine, Ser: 3.01 mg/dL — ABNORMAL HIGH (ref 0.61–1.24)
GFR, Estimated: 21 mL/min — ABNORMAL LOW (ref >60)
Glucose, Bld: 91 mg/dL (ref 70–99)
Potassium: 4.9 mmol/L (ref 3.5–5.1)
Sodium: 133 mmol/L — ABNORMAL LOW (ref 135–145)
Total Bilirubin: 3.8 mg/dL — ABNORMAL HIGH (ref 0.3–1.2)
Total Protein: 6.1 g/dL — ABNORMAL LOW (ref 6.5–8.1)

## 2023-05-15 LAB — MRSA NEXT GEN BY PCR, NASAL
MRSA by PCR Next Gen: NOT DETECTED
MRSA by PCR Next Gen: NOT DETECTED

## 2023-05-15 LAB — GLUCOSE, CAPILLARY
Glucose-Capillary: 14 mg/dL — CL (ref 70–99)
Glucose-Capillary: 221 mg/dL — ABNORMAL HIGH (ref 70–99)
Glucose-Capillary: 58 mg/dL — ABNORMAL LOW (ref 70–99)
Glucose-Capillary: 67 mg/dL — ABNORMAL LOW (ref 70–99)
Glucose-Capillary: 73 mg/dL (ref 70–99)

## 2023-05-15 LAB — HEPATITIS PANEL, ACUTE
HCV Ab: NONREACTIVE
Hep A IgM: NONREACTIVE
Hep B C IgM: NONREACTIVE
Hepatitis B Surface Ag: NONREACTIVE

## 2023-05-15 LAB — CBC
HCT: 39.7 % (ref 39.0–52.0)
Hemoglobin: 12.9 g/dL — ABNORMAL LOW (ref 13.0–17.0)
MCH: 32.2 pg (ref 26.0–34.0)
MCHC: 32.5 g/dL (ref 30.0–36.0)
MCV: 99 fL (ref 80.0–100.0)
Platelets: 223 10*3/uL (ref 150–400)
RBC: 4.01 MIL/uL — ABNORMAL LOW (ref 4.22–5.81)
RDW: 13.7 % (ref 11.5–15.5)
WBC: 13.8 10*3/uL — ABNORMAL HIGH (ref 4.0–10.5)
nRBC: 0.1 % (ref 0.0–0.2)

## 2023-05-15 LAB — LACTIC ACID, PLASMA: Lactic Acid, Venous: 4.7 mmol/L (ref 0.5–1.9)

## 2023-05-15 SURGERY — RIGHT HEART CATH
Anesthesia: Moderate Sedation

## 2023-05-15 MED ORDER — ENSURE ENLIVE PO LIQD
237.0000 mL | Freq: Two times a day (BID) | ORAL | Status: DC
  Administered 2023-05-16 – 2023-05-24 (×16): 237 mL via ORAL

## 2023-05-15 MED ORDER — SODIUM CHLORIDE 0.9 % IV SOLN
250.0000 mL | INTRAVENOUS | Status: DC | PRN
  Administered 2023-05-16 – 2023-05-21 (×2): 250 mL via INTRAVENOUS

## 2023-05-15 MED ORDER — SPIRONOLACTONE 25 MG PO TABS: ORAL_TABLET | ORAL | Status: DC

## 2023-05-15 MED ORDER — FUROSEMIDE 10 MG/ML IJ SOLN
80.0000 mg | Freq: Once | INTRAMUSCULAR | Status: AC
  Administered 2023-05-15: 80 mg via INTRAVENOUS
  Filled 2023-05-15: qty 8

## 2023-05-15 MED ORDER — ADULT MULTIVITAMIN W/MINERALS CH
1.0000 | ORAL_TABLET | Freq: Every day | ORAL | Status: DC
  Administered 2023-05-15: 1 via ORAL
  Filled 2023-05-15: qty 1

## 2023-05-15 MED ORDER — ARGATROBAN 50 MG/50ML IV SOLN
0.5000 ug/kg/min | INTRAVENOUS | Status: DC
  Administered 2023-05-16: 0.5 ug/kg/min via INTRAVENOUS
  Filled 2023-05-15: qty 50

## 2023-05-15 MED ORDER — ARGATROBAN 50 MG/50ML IV SOLN: 0.5000 ug/kg/min | INTRAVENOUS | Status: DC

## 2023-05-15 MED ORDER — DEXTROSE 50 % IV SOLN
INTRAVENOUS | Status: AC
  Filled 2023-05-15: qty 50

## 2023-05-15 MED ORDER — SODIUM CHLORIDE 0.9% FLUSH: 3.0000 mL | INTRAVENOUS | Status: DC | PRN

## 2023-05-15 MED ORDER — MILRINONE LACTATE IN DEXTROSE 20-5 MG/100ML-% IV SOLN
0.2500 ug/kg/min | INTRAVENOUS | Status: DC
  Administered 2023-05-15: 0.25 ug/kg/min via INTRAVENOUS
  Filled 2023-05-15: qty 100

## 2023-05-15 MED ORDER — CHLORHEXIDINE GLUCONATE CLOTH 2 % EX PADS
6.0000 | MEDICATED_PAD | Freq: Every day | CUTANEOUS | Status: DC
  Administered 2023-05-15 – 2023-05-24 (×11): 6 via TOPICAL

## 2023-05-15 MED ORDER — ORAL CARE MOUTH RINSE: 15.0000 mL | OROMUCOSAL | Status: DC | PRN

## 2023-05-15 MED ORDER — MILRINONE LACTATE IN DEXTROSE 20-5 MG/100ML-% IV SOLN
0.2500 ug/kg/min | INTRAVENOUS | Status: DC
  Administered 2023-05-15: 0.25 ug/kg/min via INTRAVENOUS

## 2023-05-15 MED ORDER — FUROSEMIDE 10 MG/ML IJ SOLN: 6.0000 mg/h | INTRAVENOUS | Status: DC

## 2023-05-15 MED ORDER — ONDANSETRON HCL 4 MG/2ML IJ SOLN
4.0000 mg | Freq: Four times a day (QID) | INTRAMUSCULAR | Status: DC | PRN
  Administered 2023-05-16: 4 mg via INTRAVENOUS
  Filled 2023-05-15: qty 2

## 2023-05-15 MED ORDER — PANTOPRAZOLE SODIUM 40 MG IV SOLR
40.0000 mg | Freq: Two times a day (BID) | INTRAVENOUS | Status: DC
  Administered 2023-05-15 – 2023-05-19 (×9): 40 mg via INTRAVENOUS
  Filled 2023-05-15 (×9): qty 10

## 2023-05-15 MED ORDER — ACETAMINOPHEN 325 MG PO TABS
650.0000 mg | ORAL_TABLET | ORAL | Status: DC | PRN
  Administered 2023-05-22: 650 mg via ORAL
  Filled 2023-05-15: qty 2

## 2023-05-15 MED ORDER — HYDRALAZINE HCL 25 MG PO TABS: ORAL_TABLET | ORAL | Status: DC

## 2023-05-15 MED ORDER — MIDODRINE HCL 5 MG PO TABS
5.0000 mg | ORAL_TABLET | Freq: Three times a day (TID) | ORAL | Status: DC
  Administered 2023-05-15 (×3): 5 mg via ORAL
  Filled 2023-05-15 (×4): qty 1

## 2023-05-15 MED ORDER — SODIUM CHLORIDE 0.9 % IV SOLN: INTRAVENOUS | Status: DC

## 2023-05-15 MED ORDER — FUROSEMIDE 10 MG/ML IJ SOLN
6.0000 mg/h | INTRAVENOUS | Status: AC
  Administered 2023-05-15: 6 mg/h via INTRAVENOUS
  Filled 2023-05-15: qty 20

## 2023-05-15 MED ORDER — ARGATROBAN 50 MG/50ML IV SOLN
0.5000 ug/kg/min | INTRAVENOUS | Status: DC
  Filled 2023-05-15: qty 50

## 2023-05-15 MED ORDER — SODIUM CHLORIDE 0.9% FLUSH
3.0000 mL | Freq: Two times a day (BID) | INTRAVENOUS | Status: DC
  Administered 2023-05-15 – 2023-05-23 (×11): 3 mL via INTRAVENOUS

## 2023-05-15 MED ORDER — ADULT MULTIVITAMIN W/MINERALS CH: 1.0000 | ORAL_TABLET | Freq: Every day | ORAL | Status: DC

## 2023-05-15 MED ORDER — APIXABAN 5 MG PO TABS: ORAL_TABLET | ORAL | Status: DC

## 2023-05-15 MED ORDER — ENSURE ENLIVE PO LIQD: 237.0000 mL | Freq: Two times a day (BID) | ORAL | Status: AC

## 2023-05-15 MED ORDER — MIDODRINE HCL 5 MG PO TABS
5.0000 mg | ORAL_TABLET | Freq: Three times a day (TID) | ORAL | Status: DC
  Administered 2023-05-16 – 2023-05-19 (×11): 5 mg via ORAL
  Filled 2023-05-15 (×12): qty 1

## 2023-05-15 MED ORDER — MILRINONE LACTATE IN DEXTROSE 20-5 MG/100ML-% IV SOLN: 0.2500 ug/kg/min | INTRAVENOUS | Status: DC

## 2023-05-15 MED ORDER — MIDODRINE HCL 5 MG PO TABS: 5.0000 mg | ORAL_TABLET | Freq: Three times a day (TID) | ORAL | Status: DC

## 2023-05-15 MED ORDER — CHLORHEXIDINE GLUCONATE CLOTH 2 % EX PADS
6.0000 | MEDICATED_PAD | Freq: Every day | CUTANEOUS | Status: DC
  Administered 2023-05-15: 6 via TOPICAL

## 2023-05-15 MED ORDER — EMPAGLIFLOZIN 10 MG PO TABS: ORAL_TABLET | ORAL | Status: DC

## 2023-05-15 MED ORDER — CARVEDILOL 6.25 MG PO TABS: ORAL_TABLET | ORAL | Status: DC

## 2023-05-15 MED ORDER — DEXTROSE 50 % IV SOLN
12.5000 g | INTRAVENOUS | Status: AC
  Administered 2023-05-15: 12.5 g via INTRAVENOUS

## 2023-05-15 MED ORDER — ISOSORBIDE MONONITRATE ER 30 MG PO TB24: ORAL_TABLET | ORAL | Status: DC

## 2023-05-15 MED ORDER — FUROSEMIDE 10 MG/ML IJ SOLN
6.0000 mg/h | INTRAVENOUS | Status: DC
  Administered 2023-05-15: 6 mg/h via INTRAVENOUS
  Filled 2023-05-15: qty 20

## 2023-05-15 NOTE — Consult Note (Addendum)
ANTICOAGULATION CONSULT NOTE   Pharmacy Consult for argatroban Indication: atrial fibrillation  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   Pork-Derived Products Anaphylaxis   Ace Inhibitors Other (See Comments)    Other Reaction: Other reaction/ Urticaria secondary to COX-1 inhibitors Angioedema   Aspirin Other (See Comments)    Other Reaction: Other reaction   Indocin [Indomethacin] Other (See Comments)   Lactase-Lactobacillus Other (See Comments)   Lisinopril Other (See Comments)   Mobic [Meloxicam] Other (See Comments)   Naproxen Other (See Comments)   Nsaids Other (See Comments)    Other Reaction: Other reaction     Patient Measurements: Height: 5\' 8"  (172.7 cm) Weight: 76.2 kg (168 lb 1.6 oz) IBW/kg (Calculated) : 68.4 Heparin Dosing Weight: 76.2 kg  Vital Signs: Temp: 98.2 F (36.8 C) (09/27 1553) Temp Source: Axillary (09/27 1553) BP: 125/70 (09/27 1600) Pulse Rate: 94 (09/27 1600)  Labs: Recent Labs    05/14/23 0946 05/14/23 1201 05/14/23 1742 05/14/23 1932 05/14/23 2123 05/15/23 0311  HGB 13.0  --   --   --   --  12.9*  HCT 40.4  --   --   --   --  39.7  PLT 267  --   --   --   --  223  CREATININE 2.25*  --  2.32*  --   --  3.01*  TROPONINIHS 44*   < > 71* 53* 82*  --    < > = values in this interval not displayed.    Estimated Creatinine Clearance: 20.5 mL/min (A) (by C-G formula based on SCr of 3.01 mg/dL (H)).   Medical History: Past Medical History:  Diagnosis Date   Abnormal chest CT    Anaphylactic reaction    Anxiety    Asthma    Benign hypertension    Cardiomyopathy    Cardiomyopathy, idiopathic (HCC)    CHF (congestive heart failure) (HCC)    Chronic renal insufficiency    With creatinine 1.4   Complication of anesthesia    Drug-induced urticaria    FH: thoracic aortic aneurysm    GERD (gastroesophageal reflux disease)    History of colon polyps    Hives    Long Hx of hives    Hyperlipidemia    Hyperlipidemia    IMPLANTATION OF DEFIBRILLATOR, HX OF 02/15/2009   Qualifier: Diagnosis of  By: Jens Som, MD, Lyn Hollingshead    NSVT (nonsustained ventricular tachycardia) (HCC)    Osteoarthritis    Osteoarthrosis, unspecified whether generalized or localized, lower leg    Osteoporosis    Oxygen deficiency    Psoriasis    PVC (premature ventricular contraction)    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    Medications:  Medications Prior to Admission  Medication Sig Dispense Refill Last Dose   amiodarone (PACERONE) 200 MG tablet TAKE 1 TABLET BY MOUTH EVERY DAY 90 tablet 1 05/13/2023 at 1800   apixaban (ELIQUIS) 5 MG TABS tablet Take 1 tablet (5 mg total) by mouth 2 (two) times daily. 60 tablet 11 05/14/2023 at 0700   atorvastatin (LIPITOR) 10 MG tablet Take 1 tablet (10 mg total) by mouth daily. 90 tablet 1 05/14/2023   carvedilol (COREG) 6.25 MG tablet TAKE 1 TABLET BY MOUTH TWICE A DAY 180 tablet 3 05/13/2023 at 1800   cetirizine (ZYRTEC) 10 MG tablet Take 10 mg by mouth daily.   05/14/2023   empagliflozin (JARDIANCE) 10 MG TABS tablet  Take 1 tablet (10 mg total) by mouth daily before breakfast. 28 tablet 0 05/14/2023   EPINEPHrine 0.3 mg/0.3 mL IJ SOAJ injection Inject 0.3 mg into the muscle as needed for anaphylaxis.   prn at unk   famotidine (PEPCID) 20 MG tablet Take 20 mg by mouth daily.   05/14/2023   furosemide (LASIX) 20 MG tablet Take 20 mg by mouth daily as needed for edema.      hydrALAZINE (APRESOLINE) 25 MG tablet Take 12.5 mg by mouth in the morning and at bedtime.   05/13/2023   isosorbide mononitrate (IMDUR) 30 MG 24 hr tablet Take 1 tablet (30 mg total) by mouth daily.   05/14/2023   Menthol-Methyl Salicylate (SALONPAS PAIN RELIEF PATCH) 3-10 % PTCH Apply 1 application  topically daily.   prn at unk   spironolactone (ALDACTONE) 25 MG tablet TAKE 1/2 TABLET BY MOUTH DAILY 45 tablet 3 05/14/2023   hydrALAZINE (APRESOLINE) 50 MG tablet Take 0.5  tablets (25 mg total) by mouth 2 (two) times daily. Take 1/2 tablet BID (Patient not taking: Reported on 05/14/2023) 180 tablet 3 Not Taking   predniSONE (DELTASONE) 10 MG tablet Take 1 tablet (10 mg total) by mouth as directed. (Patient not taking: Reported on 05/14/2023) 42 tablet 0 Not Taking   Scheduled:   Chlorhexidine Gluconate Cloth  6 each Topical Daily   feeding supplement  237 mL Oral BID BM   midodrine  5 mg Oral TID WC   multivitamin with minerals  1 tablet Oral Daily   pantoprazole (PROTONIX) IV  40 mg Intravenous Q12H   Infusions:   [START ON 05/16/2023] sodium chloride     furosemide (LASIX) 200 mg in dextrose 5 % 100 mL (2 mg/mL) infusion Stopped (05/15/23 1118)   milrinone 0.25 mcg/kg/min (05/15/23 1719)   PRN:  Anti-infectives (From admission, onward)    None       Assessment: 77 y.o. 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 Pt is on apixaban PTA. Last dose apixaban 9/27 0913. Pt has a pork allergy: anaphylaxis.     Goal of Therapy:  aPTT 50-90 seconds .   Monitor platelets by anticoagulation protocol: Yes   Plan:  Will start argatroban 0.5 mcg/kgmin. Plan to order aPTT in 4 hours. CBC daily while on argatroban.    Ronnald Ramp, PharmD, BCPS 05/15/2023,5:39 PM

## 2023-05-15 NOTE — Consult Note (Addendum)
Cardiology Consultation   Patient ID: [name] MRN: [id number]; DOB: [birth year]  Admit date: 05/14/2023 Date of Consult: 05/15/2023  PCP:  [name], MD   [facility] Providers Cardiologist:  Olga Millers, MD       Patient Profile:   [name] is a 77 y.o. male with a hx of chronic HFrEF due to nonischemic cardiomyopathy diagnosed in 2004, persistent atrial fibrillation, thoracic aortic aneurysm, hypertension, and hyperlipidemia who is being seen 05/15/2023 for the evaluation of heart failure and atrial fibrillation at the request of Dr. Alvester Morin.  History of Present Illness:   [name] reports progressive exertional dyspnea for the last several weeks.  He had undergone elective cardioversion in late August and initially felt better.  However, he became concerned that he may have gone back into atrial fibrillation due to progressive fatigue and exertional dyspnea.  He also reports intermittent "dizzy spells" he saw [name] in the office 3 days ago, at which time it was noted that [name] was in atrial flutter.  It was felt that he would likely not maintain sinus rhythm with repeat cardioversion given that he has been on amiodarone for quite some time.  It was therefore recommended that he proceed with EP consultation for possible ablation.  It was also felt that his chronic HFrEF was progressing, prompting referral to the advanced heart failure clinic.  Repeat echocardiogram was also ordered.  At this time, [name] states that he does not feel well at all.  He gets short of breath walking about 5 to 10 feet.  He cannot lie flat for any extended period of time due to orthopnea.  His wife notes that he is up and down at night due to shortness of breath.  He denies chest pain, palpitations, lightheadedness, and edema.  He has been compliant with his medications at home.  He does not feel like he has been retaining much in the way of fluid.  His  appetite has been poor.  Past Medical History:  Diagnosis Date   Abnormal chest CT    Anaphylactic reaction    Anxiety    Asthma    Benign hypertension    Cardiomyopathy    Cardiomyopathy, idiopathic (HCC)    CHF (congestive heart failure) (HCC)    Chronic renal insufficiency    With creatinine 1.4   Drug-induced urticaria    FH: thoracic aortic aneurysm    GERD (gastroesophageal reflux disease)    History of colon polyps    Hives    Long Hx of hives   Hyperlipidemia    Hyperlipidemia    IMPLANTATION OF DEFIBRILLATOR, HX OF 02/15/2009   Qualifier: Diagnosis of  By: Jens Som, MD, Lyn Hollingshead    NSVT (nonsustained ventricular tachycardia) (HCC)    Osteoarthritis    Osteoarthrosis, unspecified whether generalized or localized, lower leg    Osteoporosis    Oxygen deficiency    Psoriasis    PVC (premature ventricular contraction)    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    Past Surgical History:  Procedure Laterality Date   APPENDECTOMY     BIV ICD GENERTAOR CHANGE OUT N/A 10/08/2012   Procedure: BIV ICD GENERTAOR CHANGE OUT;  Surgeon: Marinus Maw, MD;  Location: [facility] CATH LAB;  Service: Cardiovascular;  Laterality: N/A;   CARDIAC CATHETERIZATION     CARDIAC DEFIBRILLATOR REMOVAL  2014   CARDIOVERSION N/A 04/07/2023   Procedure: CARDIOVERSION;  Surgeon: Chilton Si,  MD;  Location: [facility] INVASIVE CV LAB;  Service: Cardiovascular;  Laterality: N/A;   CATARACT EXTRACTION     CATARACT EXTRACTION W/PHACO Left 03/12/2016   Procedure: CATARACT EXTRACTION PHACO AND INTRAOCULAR LENS PLACEMENT (IOC);  Surgeon: Sallee Lange, MD;  Location: [facility] ORS;  Service: Ophthalmology;  Laterality: Left;  [place] 01:32 AP% 25.7 CDE 41.33 fluid pack lot # [id number]   COLONOSCOPY WITH PROPOFOL N/A 04/26/2019   Procedure: COLONOSCOPY WITH PROPOFOL;  Surgeon: Christena Deem, MD;  Location: [facility] ENDOSCOPY;  Service: Endoscopy;  Laterality: N/A;   ESOPHAGOGASTRODUODENOSCOPY     EYE  SURGERY     Implantation of dual-chamber ICD.     [employer] DDD-ICD 1/07   JOINT REPLACEMENT Right 08/16/2009   KNEE ARTHROPLASTY Left 2006   KNEE ARTHROPLASTY Right 12/10   x2   LAPAROSCOPIC APPENDECTOMY N/A 08/12/2018   Procedure: APPENDECTOMY LAPAROSCOPIC;  Surgeon: Henrene Dodge, MD;  Location: [facility] ORS;  Service: General;  Laterality: N/A;   TONSILLECTOMY       Home Medications:  Prior to Admission medications   Medication Sig Start Date [name] Date Taking? Authorizing Provider  amiodarone (PACERONE) 200 MG tablet TAKE 1 TABLET BY MOUTH EVERY DAY 04/22/23  Yes Crenshaw, Madolyn Frieze, MD  apixaban (ELIQUIS) 5 MG TABS tablet Take 1 tablet (5 mg total) by mouth 2 (two) times daily. 03/11/23  Yes Wittenborn, Gavin Pound, NP  atorvastatin (LIPITOR) 10 MG tablet Take 1 tablet (10 mg total) by mouth daily. 12/23/21  Yes Lewayne Bunting, MD  carvedilol (COREG) 6.25 MG tablet TAKE 1 TABLET BY MOUTH TWICE A DAY 03/19/23  Yes Lewayne Bunting, MD  cetirizine (ZYRTEC) 10 MG tablet Take 10 mg by mouth daily.   Yes [provider]  empagliflozin (JARDIANCE) 10 MG TABS tablet Take 1 tablet (10 mg total) by mouth daily before breakfast. 05/12/23  Yes Crenshaw, Madolyn Frieze, MD  EPINEPHrine 0.3 mg/0.3 mL IJ SOAJ injection Inject 0.3 mg into the muscle as needed for anaphylaxis.   Yes [provider]  famotidine (PEPCID) 20 MG tablet Take 20 mg by mouth daily. 11/02/18  Yes [provider]  furosemide (LASIX) 20 MG tablet Take 20 mg by mouth daily as needed for edema. 05/13/23 05/12/24 Yes [provider]  hydrALAZINE (APRESOLINE) 25 MG tablet Take 12.5 mg by mouth in the morning and at bedtime. 05/13/23 05/12/24 Yes [provider]  isosorbide mononitrate (IMDUR) 30 MG 24 hr tablet Take 1 tablet (30 mg total) by mouth daily. 03/21/13 05/14/23 Yes Crenshaw, Madolyn Frieze, MD  Menthol-Methyl Salicylate (SALONPAS PAIN RELIEF PATCH) 3-10 % PTCH Apply 1 application  topically daily.   Yes [provider]  spironolactone (ALDACTONE) 25 MG tablet TAKE 1/2 TABLET BY MOUTH DAILY 03/19/23  Yes Lewayne Bunting, MD  hydrALAZINE (APRESOLINE) 50 MG tablet Take 0.5 tablets (25 mg total) by mouth 2 (two) times daily. Take 1/2 tablet BID Patient not taking: Reported on 05/14/2023 11/04/22   Lewayne Bunting, MD  predniSONE (DELTASONE) 10 MG tablet Take 1 tablet (10 mg total) by mouth as directed. Patient not taking: Reported on 05/14/2023 04/07/23   Cuthriell, Delorise Royals, PA-C    Inpatient Medications: Scheduled Meds:  apixaban  5 mg Oral BID   feeding supplement  237 mL Oral BID BM   midodrine  5 mg Oral TID WC   multivitamin with minerals  1 tablet Oral Daily   pantoprazole (PROTONIX) IV  40 mg Intravenous Q12H   Continuous Infusions:  [  START ON 05/16/2023] sodium chloride     furosemide (LASIX) 200 mg in dextrose 5 % 100 mL (2 mg/mL) infusion 6 mg/hr (05/15/23 1031)   PRN Meds:   Allergies:    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   Pork-Derived Products Anaphylaxis   Ace Inhibitors Other (See Comments)    Other Reaction: Other reaction/ Urticaria secondary to COX-1 inhibitors Angioedema   Aspirin Other (See Comments)    Other Reaction: Other reaction   Indocin [Indomethacin] Other (See Comments)   Lactase-Lactobacillus Other (See Comments)   Lisinopril Other (See Comments)   Mobic [Meloxicam] Other (See Comments)   Naproxen Other (See Comments)   Nsaids Other (See Comments)    Other Reaction: Other reaction     Social History:   Social History   Tobacco Use   Smoking status: Former    Current packs/day: 0.00    Average packs/day: 1 pack/day for 10.0 years (10.0 ttl pk-yrs)    Types: Cigarettes    Start date: 12/30/1960    Quit date: 12/31/1970    Years since quitting: 52.4   Smokeless tobacco: Never  Vaping Use   Vaping status: Never Used  Substance Use Topics   Alcohol use: Yes    Alcohol/week: 0.0  standard drinks of alcohol    Comment: 2 DRINKS PER DAY, none this week (7/26)   Drug use: Never     Family History:   Family History  Problem Relation Age of Onset   Cancer Mother    Lung cancer Mother    Heart attack Father      ROS:  Please see the history of present illness. All other ROS reviewed and negative.     Physical Exam/Data:   Vitals:   05/15/23 0457 05/15/23 0728 05/15/23 0744 05/15/23 1132  BP: 93/63  93/61 (!) 96/54  Pulse: 89  90 90  Resp: 20  18 16   Temp: 97.6 F (36.4 C)  97.6 F (36.4 C) 97.8 F (36.6 C)  TempSrc:      SpO2: 100%  100% 100%  Weight:  76.2 kg    Height:        Intake/Output Summary (Last 24 hours) at 05/15/2023 1420 Last data filed at 05/15/2023 0344 Gross per 24 hour  Intake 243 ml  Output 850 ml  Net -607 ml      05/15/2023    7:28 AM 05/14/2023    9:44 AM 05/12/2023    7:48 AM  Last 3 Weights  Weight (lbs) 168 lb 1.6 oz 143 lb 4.8 oz 165 lb  Weight (kg) 76.25 kg 65 kg 74.844 kg     Body mass index is 25.56 kg/m.  General: Chronically ill-appearing man seated in a recliner.  His wife is with him. HEENT: normal Neck: JVP approximately 8 cm with positive HJR. Vascular: No carotid bruits; trace radial pulses bilaterally. Cardiac: Distant and irregular rhythm with 1/6 systolic murmur. Lungs: Mildly diminished breath sounds throughout without wheezes or crackles. Abd: soft, nontender, no hepatomegaly  Ext: 1+ pretibial edema bilaterally. Musculoskeletal:  No deformities, BUE and BLE strength normal and equal Skin: Calves are cool to touch with reddish-brown discoloration, which the patient states is chronic Neuro:  CNs 2-12 intact, no focal abnormalities noted Psych: Flat affect.  EKG:  The EKG was personally reviewed and demonstrates: Atrial flutter with variable AV block, nonspecific intraventricular conduction delay, and nonspecific ST/T changes. Telemetry:  Telemetry was personally reviewed and demonstrates:  Atrial  fibrillation/flutter with variable AV block.  Relevant CV Studies: TTE (11/11/2022):  1. Left ventricular ejection fraction, by estimation, is 25 to 30%. Left  ventricular ejection fraction by 2D MOD biplane is 30.4 %. The left  ventricle has severely decreased function. The left ventricle demonstrates  global hypokinesis. There is mild  left ventricular hypertrophy. Left ventricular diastolic parameters are  consistent with Grade II diastolic dysfunction (pseudonormalization). The  average left ventricular global longitudinal strain is -11.8 %.   2. Right ventricular systolic function is normal. The right ventricular  size is normal. There is moderately elevated pulmonary artery systolic  pressure. The estimated right ventricular systolic pressure is 57.6 mmHg.   3. Left atrial size was severely dilated.   4. The mitral valve is normal in structure. Moderate mitral valve  regurgitation. No evidence of mitral stenosis.   5. Tricuspid valve regurgitation is moderate.   6. The aortic valve has an indeterminant number of cusps. Aortic valve  regurgitation is mild to moderate. No aortic stenosis is present.   7. There is mild dilatation of the aortic root, measuring 44 mm. There is  mild dilatation of the ascending aorta, measuring 42 mm.   8. The inferior vena cava is dilated in size with >50% respiratory  variability, suggesting right atrial pressure of 8 mmHg.   Laboratory Data:  High Sensitivity Troponin:   Recent Labs  Lab 05/14/23 0946 05/14/23 1201 05/14/23 1742 05/14/23 1932 05/14/23 2123  TROPONINIHS 44* 46* 71* 53* 82*     Chemistry Recent Labs  Lab 05/14/23 0946 05/14/23 1742 05/15/23 0311  NA 134* 134* 133*  K 5.0 4.3 4.9  CL 102 102 98  CO2 18* 18* 17*  GLUCOSE 127* 104* 91  BUN 24* 27* 33*  CREATININE 2.25* 2.32* 3.01*  CALCIUM 8.4* 8.3* 8.3*  GFRNONAA 30* 29* 21*  ANIONGAP 14 14 18*    Recent Labs  Lab 05/14/23 0946 05/15/23 0311  PROT 6.4* 6.1*   ALBUMIN 3.1* 3.2*  AST 235* 4,521*  ALT 138* 2,191*  ALKPHOS 125 120  BILITOT 2.1* 3.8*   Lipids No results for input(s): "CHOL", "TRIG", "HDL", "LABVLDL", "LDLCALC", "CHOLHDL" in the last 168 hours.  Hematology Recent Labs  Lab 05/14/23 0946 05/15/23 0311  WBC 7.7 13.8*  RBC 4.05* 4.01*  HGB 13.0 12.9*  HCT 40.4 39.7  MCV 99.8 99.0  MCH 32.1 32.2  MCHC 32.2 32.5  RDW 13.6 13.7  PLT 267 223   Thyroid No results for input(s): "TSH", "FREET4" in the last 168 hours.  BNP Recent Labs  Lab 05/14/23 0946  BNP 4,176.1*    DDimer No results for input(s): "DDIMER" in the last 168 hours.   Radiology/Studies:  US Abdomen Limited RUQ (LIVER/GB)  Result Date: 05/14/2023 CLINICAL DATA:  77 year old male with hyperbilirubinemia EXAM: ULTRASOUND ABDOMEN LIMITED RIGHT UPPER QUADRANT COMPARISON:  04/21/2015, chest CT 10/27/2022, abdominal CT 08/12/2018 FINDINGS: Gallbladder: Thickened gallbladder wall, as great as 7 mm at the fundus. Pericholecystic fluid. Hyperechoic foci adherent to the wall of the gallbladder. Sonographic Murphy's sign is recorded as negative. Largest calculus 7 mm. Common bile duct: Common bile duct was not visualized Liver: No focal lesion identified. Increased echogenicity. Portal vein is patent on color Doppler imaging with normal direction of blood flow towards the liver. Other: None. IMPRESSION: Ultrasound is ultimately equivocal for acute calculus cholecystitis, as the sonographic Murphy's sign is negative, however, the gallbladder wall is thickened with pericholecystic fluid, and there is evidence of cholelithiasis.  If ongoing concern for cholecystitis and further diagnostic imaging is warranted, consider a nuclear medicine HIDA test, as CT will likely not add any further anatomic information. Differential diagnosis for this gallbladder appearance includes chronic medical liver disease. Electronically Signed   By: Gilmer Mor D.O.   On: 05/14/2023 16:23   DG Chest  Port 1 View  Result Date: 05/14/2023 CLINICAL DATA:  Shortness of breath. Weakness and dyspnea on exertion. EXAM: PORTABLE CHEST 1 VIEW COMPARISON:  04/07/2023. FINDINGS: Mildly increased interstitial markings with lower lobe gradient. There is also mild central vascular congestion. Findings favor congestive heart failure/pulmonary edema. There are probable atelectatic changes at the lung bases. No dense consolidation or major lung collapse. Bilateral lateral costophrenic angles are clear. Stable moderately enlarged cardio-mediastinal silhouette. Aortic arch calcifications noted. Abandoned pacemaker leads noted. No acute osseous abnormalities. The soft tissues are within normal limits. IMPRESSION: Findings favor congestive heart failure/pulmonary edema. Electronically Signed   By: Jules Schick M.D.   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  He appears mildly volume overloaded on exam today.  However his extremities are cool with evidence of acute kidney injury superimposed on chronic kidney disease and marked derangements in his LFTs, all concerning for low output heart failure and venous congestion. -Agree with furosemide infusion.  Escalation may be necessary based on urine output. -Lactate currently pending.  If abnormal, will start empiric milrinone for inotropic support. -Proceed with right heart catheterization today.  Based on hemodynamics, may need to consider transfer to [facility] +/- IABP placement. -Okay to continue midodrine for now given soft blood pressure, though low term I would prefer avoiding this afterload increasing agent.  Persistent atrial fibrillation/flutter: Ventricular rates reasonably well-controlled. -Hold apixaban pending invasive procedures.  Can start heparin  infusion when next dose of apixaban would be due. -Hold amiodarone in the setting of LFT derangements. -Defer AV nodal blocking agents such as beta-blockers and nondihydropyridine calcium channel blockers in the setting of low output heart failure. -Not a good candidate for digoxin in the setting of acute kidney injury superimposed on chronic kidney disease.  Acute kidney injury superimposed on chronic kidney disease: Suspect this is cardiorenal syndrome. -Continue diuresis. -Low threshold for inotropic +/- mechanical support and transfer to [facility] for management by the advanced heart failure team.  Transaminitis: I suspect abnormal LFTs and abnormal gallbladder findings on ultrasound are from congestive hepatopathy. -Proceed with diuresis and heart failure management, as above. -Hold potentially hepatotoxic agents. -Could consider further workup for viral hepatitis per primary team.  Addendum (05/15/23 5:31 PM): Venous lactate level came back significantly elevated at 4.7.  Case was discussed with Dr. Gasper Lloyd (advanced heart failure team).  Consensus is that the patient's presentation is consistent with acute on chronic HFrEF and cardiogenic shock.  We have agreed to defer right heart catheterization and instead plan for transfer to [facility] for further evaluation and management by the advanced heart failure service.  In the meantime, we will transfer the patient to the ICU at [facility] and initiate milrinone 0.25 mcg/kg/min.  Lasix infusion should be maintained at its current rate as IV access allows.  I have consulted the critical care team to assist with placement of a central venous catheter to allow for infusion of vasoactive medications as well as CVP  and central venous oxygen saturation monitoring.  Agree with argatroban in lieu of heparin and apixaban given history of alpha-gal and potential need for invasive procedures.  CRITICAL CARE Performed by: [name], MD  Total  critical care time: 60 minutes  Critical care time was exclusive of separately billable procedures and treating other patients.  Critical care was necessary to treat or prevent imminent or life-threatening deterioration.  Critical care was time spent personally by me (independent of midlevel providers or residents) on the following activities: development of treatment plan with patient and/or surrogate as well as nursing, discussions with consultants, evaluation of patient's response to treatment, examination of patient, obtaining history from patient or surrogate, ordering and performing treatments and interventions, ordering and review of laboratory studies, ordering and review of radiographic studies, pulse oximetry and re-evaluation of patient's condition.  For questions or updates, please contact [facility] Please consult [email or web address] for contact info under [facility].  Signed, [name], MD  05/15/2023 2:20 PM

## 2023-05-15 NOTE — Discharge Summary (Addendum)
Physician Discharge Summary   [name]  male DOB: 1946-02-06  ZOX:[id number]  PCP: [name], MD  Admit date: 05/14/2023 Discharge date: 05/15/2023  Admitted From: home Disposition:  Transfer to [facility] CODE STATUS: Full code  Discharge Instructions     Diet - low sodium heart healthy   Complete by: As directed       Hospital Course:  For full details, please see H&P, progress notes, consult notes and ancillary notes.  Briefly,  [name] is a 77 y.o. male with medical history significant of HFpEF, stage III CKD, atrial fibrillation, presenting with acute on chronic HFpEF, acute on chronic kidney disease.    Has had progressive shortness of breath over multiple weeks.  Noted of had a cardioversion done with cardiology on August 20.  Per the patient, symptoms have persisted.  Was recently seen by [name] on September 24 with plan for ablative procedure for atrial fibrillation.  Has had worsening orthopnea and PND.  Stated that he was given a diuretic at last cardiology evaluation.  However, patient has not taken the medication as of yet.    Acute on chronic HFrEF (heart failure with reduced ejection fraction) (HCC) 2D echo March 2024 with a EF of 25 to 30% and grade 2 diastolic dysfunction BNP 4200.  CXR with finding of pulm edema. --s/p IV lasix 40 on presentation, with no increased urine output --cardio consulted on presentation --Pt transferred to ICU to start milrinone gtt for inotropic support.  --IV lasix 80 mg f/b lasix gtt --plan to transfer to [facility] under heart failure service for further management.   Severely Elevated LFT's  --AST went from 235 to 4521 overnight, ALT 138 to 2191.  Alk phos wnl, no abdominal pain, unlikely to be cholecystitis.  Acute hepatitis panel neg.  Likely from ischemia and liver congestion. --treat CHF   Acute kidney injury superimposed on chronic kidney disease 3b (HCC) Baseline creatinine around 1.2-1.7 with GFR in  the 40s --Cr 2.25 on presentation, and continue to trend up to 3.01 next morning, likely due to cardio-renal syndrome. --cont diuresis with milrinone support as above   Metabolic acidosis Lactic acidosis --2/2 AKI and ischemia from poor perfusion with decompensated CHF --Hold off IVF since pt is in decompensated CHF   Persistent atrial fibrillation (HCC) Baseline paroxysmal atrial fibrillation with noted recent cardioversion on August 20 and persistence of symptoms Seen by [name] recently in the outpatient setting-plan for outpatient ablation evaluation with cardiac EP Plan: --hold amiodarone for now due to severely elevated LFT's, per cardio (resume if HR up to 120's) -Hold home apixaban pending invasive procedures.  Last dose of Eliquis morning of 9/27.  Due to pt's allergy to pork products, can start argatroban when Eliquis is next due tonight.  (Although right before discharge, RN noted sudden onset hematuria, so may need to hold anticoagulation depending on the severity of hematuria).    Elevated troponin --likely demand ischemia   Hypotension 2/2 reduced cardiac output --start midodrine 5 mg TID --start milrinone gtt    Unless noted above, medications under "STOP" list are ones pt was not taking PTA.  Discharge Diagnoses:  Principal Problem:   CHF exacerbation (HCC) Active Problems:   Acute on chronic HFrEF (heart failure with reduced ejection fraction) (HCC)   Persistent atrial fibrillation (HCC)   Acute kidney injury superimposed on chronic kidney disease (HCC)   Transaminitis   Elevated troponin   30 Day Unplanned Readmission Risk Score  Flowsheet Row ED to Hosp-Admission (Current) from [facility]/14/2023 in [facility] ICU/CCU  30 Day Unplanned Readmission Risk Score (%) 20.54 Filed at 05/15/2023 1600       This score is the patient's risk of an unplanned readmission within 30 days of being discharged (0 -100%). The score is based on  dignosis, age, lab data, medications, orders, and past utilization.   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-1 inhibitors Angioedema   Aspirin Other (See Comments)   Other Reaction: Other reaction   Indocin [indomethacin] Other (See Comments)   Lactase-lactobacillus Other (See Comments)   Lisinopril Other (See Comments)   Mobic [meloxicam] Other (See Comments)   Naproxen Other (See Comments)   Nsaids Other (See Comments)   Other Reaction: Other reaction        Medication List     STOP taking these medications    furosemide 20 MG tablet Commonly known as: LASIX   predniSONE 10 MG tablet Commonly known as: DELTASONE       TAKE these medications    amiodarone 200 MG tablet Commonly known as: PACERONE TAKE 1 TABLET BY MOUTH EVERY DAY   apixaban 5 MG Tabs tablet Commonly known as: ELIQUIS Hold while on heparin gtt What changed:  how much to take how to take this when to take this additional instructions   atorvastatin 10 MG tablet Commonly known as: LIPITOR Take 1 tablet (10 mg total) by mouth daily.   carvedilol 6.25 MG tablet Commonly known as: COREG Hold due to hypotension What changed:  how much to take how to take this when to take this additional instructions   cetirizine 10 MG tablet Commonly known as: ZYRTEC Take 10 mg by mouth daily.   empagliflozin 10 MG Tabs tablet Commonly known as: [employer]. What changed:  how much to take how to take this when to take this additional instructions   EPINEPHrine 0.3 mg/0.3 mL Soaj injection Commonly known as: EPI-PEN Inject 0.3 mg into the muscle as needed for anaphylaxis.   famotidine  20 MG tablet Commonly known as: PEPCID Take 20 mg by mouth daily.   feeding supplement Liqd Take 237 mLs by mouth 2 (two) times daily between meals. Start taking on: May 16, 2023   furosemide 200 mg in dextrose 5 % 80 mL Inject 6 mg/hr into the vein continuous.   hydrALAZINE 25 MG tablet Commonly known as: APRESOLINE Hold due to hypotension. What changed:  how much to take how to take this when to take this additional instructions Another medication with the same name was removed. Continue taking this medication, and follow the directions you see here.   isosorbide mononitrate 30 MG 24 hr tablet Commonly known as: IMDUR Hold due to hypotension. What changed:  how much to take how to take this when to take this additional instructions   midodrine 5 MG tablet Commonly known as: PROAMATINE Take 1 tablet (5 mg total) by mouth 3 (three) times daily with meals. Start taking on: May 16, 2023   milrinone 20 MG/100 ML Soln infusion Commonly known as: PRIMACOR Inject 0.0191 mg/min into the  vein continuous.   multivitamin with minerals Tabs tablet Take 1 tablet by mouth daily. Start taking on: May 16, 2023   Salonpas Pain Relief Patch 3-10 % Ptch Apply 1 application  topically daily.   spironolactone 25 MG tablet Commonly known as: ALDACTONE Hold due to hypotension. 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   Pork-Derived Products Anaphylaxis   Ace Inhibitors Other (See Comments)    Other Reaction: Other reaction/ Urticaria secondary to COX-1 inhibitors Angioedema   Aspirin Other (See Comments)    Other Reaction: Other reaction   Indocin [Indomethacin] Other (See Comments)   Lactase-Lactobacillus Other (See Comments)   Lisinopril Other (See Comments)   Mobic [Meloxicam] Other (See Comments)    Naproxen Other (See Comments)   Nsaids Other (See Comments)    Other Reaction: Other reaction      The results of significant diagnostics from this hospitalization (including imaging, microbiology, ancillary and laboratory) are listed below for reference.   Consultations:   Procedures/Studies: [place] EKG SITE RITE  Result Date: 05/15/2023 If Site Rite image not attached, placement could not be confirmed due to current cardiac rhythm.  US Abdomen Limited RUQ (LIVER/GB)  Result Date: 05/14/2023 CLINICAL DATA:  77 year old male with hyperbilirubinemia EXAM: ULTRASOUND ABDOMEN LIMITED RIGHT UPPER QUADRANT COMPARISON:  04/21/2015, chest CT 10/27/2022, abdominal CT 08/12/2018 FINDINGS: Gallbladder: Thickened gallbladder wall, as great as 7 mm at the fundus. Pericholecystic fluid. Hyperechoic foci adherent to the wall of the gallbladder. Sonographic Murphy's sign is recorded as negative. Largest calculus 7 mm. Common bile duct: Common bile duct was not visualized Liver: No focal lesion identified. Increased echogenicity. Portal vein is patent on color Doppler imaging with normal direction of blood flow towards the liver. Other: None. IMPRESSION: Ultrasound is ultimately equivocal for acute calculus cholecystitis, as the sonographic Murphy's sign is negative, however, the gallbladder wall is thickened with pericholecystic fluid, and there is evidence of cholelithiasis. If ongoing concern for cholecystitis and further diagnostic imaging is warranted, consider a nuclear medicine HIDA test, as CT will likely not add any further anatomic information. Differential diagnosis for this gallbladder appearance includes chronic medical liver disease. Electronically Signed   By: Gilmer Mor D.O.   On: 05/14/2023 16:23   DG Chest Port 1 View  Result Date: 05/14/2023 CLINICAL DATA:  Shortness of breath. Weakness and dyspnea on exertion. EXAM: PORTABLE CHEST 1 VIEW COMPARISON:  04/07/2023. FINDINGS: Mildly  increased interstitial markings with lower lobe gradient. There is also mild central vascular congestion. Findings favor congestive heart failure/pulmonary edema. There are probable atelectatic changes at the lung bases. No dense consolidation or major lung collapse. Bilateral lateral costophrenic angles are clear. Stable moderately enlarged cardio-mediastinal silhouette. Aortic arch calcifications noted. Abandoned pacemaker leads noted. No acute osseous abnormalities. The soft tissues are within normal limits. IMPRESSION: Findings favor congestive heart failure/pulmonary edema. Electronically Signed   By: Jules Schick M.D.   On: 05/14/2023 11:59      Labs: BNP (last 3 results) Recent Labs    05/14/23 0946  BNP 4,176.1*   Basic Metabolic Panel: Recent Labs  Lab 05/14/23 0946 05/14/23 1742 05/15/23 0311  NA 134* 134* 133*  K 5.0 4.3 4.9  CL 102 102 98  CO2 18* 18* 17*  GLUCOSE 127* 104* 91  BUN 24* 27* 33*  CREATININE 2.25* 2.32* 3.01*  CALCIUM 8.4* 8.3* 8.3*   Liver Function Tests: Recent Labs  Lab 05/14/23 0946 05/15/23 0311  AST 235* 4,521*  ALT 138* 2,191*  ALKPHOS 125 120  BILITOT 2.1* 3.8*  PROT 6.4* 6.1*  ALBUMIN 3.1* 3.2*   No results for input(s): "LIPASE", "AMYLASE" in the last 168 hours. No results for input(s): "AMMONIA" in the last 168 hours. CBC: Recent Labs  Lab 05/14/23 0946 05/15/23 0311  WBC 7.7 13.8*  NEUTROABS 5.8  --   HGB 13.0 12.9*  HCT 40.4 39.7  MCV 99.8 99.0  PLT 267 223   Cardiac Enzymes: No results for input(s): "CKTOTAL", "CKMB", "CKMBINDEX", "TROPONINI" in the last 168 hours. BNP: Invalid input(s): "POCBNP" CBG: Recent Labs  Lab 05/15/23 1551 05/15/23 1555 05/15/23 1617  GLUCAP 14* 58* 221*   D-Dimer No results for input(s): "DDIMER" in the last 72 hours. Hgb A1c No results for input(s): "HGBA1C" in the last 72 hours. Lipid Profile No results for input(s): "CHOL", "HDL", "LDLCALC", "TRIG", "CHOLHDL", "LDLDIRECT" in  the last 72 hours. Thyroid function studies No results for input(s): "TSH", "T4TOTAL", "T3FREE", "THYROIDAB" in the last 72 hours.  Invalid input(s): "FREET3" Anemia work up No results for input(s): "VITAMINB12", "FOLATE", "FERRITIN", "TIBC", "IRON", "RETICCTPCT" in the last 72 hours. Urinalysis    Component Value Date/Time   COLORURINE YELLOW (A) 08/12/2018 0852   APPEARANCEUR CLEAR (A) 08/12/2018 0852   LABSPEC 1.018 08/12/2018 0852   PHURINE 7.0 08/12/2018 0852   GLUCOSEU NEGATIVE 08/12/2018 0852   HGBUR NEGATIVE 08/12/2018 0852   BILIRUBINUR NEGATIVE 08/12/2018 0852   KETONESUR NEGATIVE 08/12/2018 0852   PROTEINUR 30 (A) 08/12/2018 0852   UROBILINOGEN 1.0 10/09/2008 1104   NITRITE NEGATIVE 08/12/2018 0852   LEUKOCYTESUR NEGATIVE 08/12/2018 0852   Sepsis Labs Recent Labs  Lab 05/14/23 0946 05/15/23 0311  WBC 7.7 13.8*   Microbiology Recent Results (from the past 240 hour(s))  MRSA Next Gen by PCR, Nasal     Status: None   Collection Time: 05/15/23  3:53 PM   Specimen: Nasal Mucosa; Nasal Swab  Result Value Ref Range Status   MRSA by PCR Next Gen NOT DETECTED NOT DETECTED Final    Comment: (NOTE) The GeneXpert MRSA Assay (FDA approved for NASAL specimens only), is one component of a comprehensive MRSA colonization surveillance program. It is not intended to diagnose MRSA infection nor to guide or monitor treatment for MRSA infections. Test performance is not FDA approved in patients less than 79 years old. Performed at [facility], [address] [place], [place] [address]      Total time spend on discharging this patient, including the last patient exam, discussing the hospital stay, instructions for ongoing care as it relates to all pertinent caregivers, as well as preparing the medical discharge records, prescriptions, and/or referrals as applicable, is 70 minutes.    Darlin Priestly, MD  [facility] 05/15/2023, 6:14 PM

## 2023-05-15 NOTE — Progress Notes (Signed)
   05/15/23 1500  Spiritual Encounters  Type of Visit Initial  Care provided to: Pt and family  Referral source Nurse (RN/NT/LPN)  Reason for visit Urgent spiritual support  OnCall Visit Yes  Spiritual Framework  Presenting Themes Meaning/purpose/sources of inspiration;Caregiving needs  Patient Stress Factors Major life changes  Family Stress Factors Major life changes;Lack of knowledge  Interventions  Spiritual Care Interventions Made Established relationship of care and support;Compassionate presence;Reflective listening;Prayer;Encouragement  Spiritual Care Plan  Spiritual Care Issues Still Outstanding No further spiritual care needs at this time (see row info)   Received phone call from [facility]. Spoke with the PT and his wife they have concerns about the patient health and him being move to another [employer]. Prayer with the couple and let them know that we are here 24/7 for patient and family if they need to talk.

## 2023-05-15 NOTE — Progress Notes (Signed)
Was asked by Cardiology to place IJ CVC for initiation of Milrinone.  Pt and his wife both consent for procedure.  Attempt made to place Left internal jugular CVC, was able to successfully cannulate the internal jugular, however unable to thread the guidewire.  Angle changed and attempted again x2 with same result, therefore attempt was aborted. Pressure held at the site, no signs of complications or change in vital signs.    Post attempt X-ray is pending.  Pt requests we wait a few minutes before attempting right internal jugular.  He will let us know when he's ready to proceed.      Harlon Ditty, AGACNP-BC [facility] Prefer epic messenger for cross cover needs If after hours, please call E-link

## 2023-05-15 NOTE — TOC Benefit Eligibility Note (Signed)
Patient Product/process development scientist completed.    The patient is insured through [facility]. Patient has Medicare and is not eligible for a copay card, but may be able to apply for patient assistance, if available.    Ran test claim for Entresto 24-26 mg and the current 30 day co-pay is $47.00.  Ran test claim for Farxiga 10 mg and the current 30 day co-pay is $47.00.  Ran test claim for Jardiance 10 mg and the current 30 day co-pay is $47.00.   This test claim was processed through [facility]- copay amounts may vary at other pharmacies due to pharmacy/plan contracts, or as the patient moves through the different stages of their insurance plan.     Roland Earl, CPHT Pharmacy Technician III Certified Patient Advocate [facility] Patient Advocate Team Direct Number: [phone]  Fax: [phone]

## 2023-05-15 NOTE — Progress Notes (Signed)
Initial Nutrition Assessment  DOCUMENTATION CODES:   Not applicable  INTERVENTION:   -Liberalize diet to 2 gram sodium for wider variety of meal selections -MVI with minerals daily -Ensure Enlive po BID, each supplement provides 350 kcal and 20 grams of protein.  -RD provided "Low Sodium Nutrition Therapy" handout from [facility] Nutrition Care Manual; attached to AVS/ discharge summary   NUTRITION DIAGNOSIS:   Increased nutrient needs related to chronic illness (CHF) as evidenced by estimated needs.  GOAL:   Patient will meet greater than or equal to 90% of their needs  MONITOR:   PO intake, Supplement acceptance  REASON FOR ASSESSMENT:   Malnutrition Screening Tool    ASSESSMENT:   Pt with medical history significant of HFpEF, stage III CKD, atrial fibrillation, PVCs, GERD presenting with acute on chronic HFpEF, acute on chronic kidney disease. Admitted due to symptoms of shortness of breath.  Pt admitted with CHF and a-fib.   Reviewed I/O's: -607 ml x 24 hours   UOP: 850 ml x 24 hours  Pt unavailable at time of visit. Attempted to speak with pt via call to hospital room phone, however, unable to reach. RD unable to obtain further nutrition-related history or complete nutrition-focused physical exam at this time.    Pt currently on a heart healthy, carb modified diet. No meal completion data available to assess at this time.   Reviewed wt hx; pt has experienced a 2.2% wt loss over the past 6 months, which is not significant for time frame. Per RN notes, pt with moderate edema, which may be masking true weight loss as well as fat and muscle depletions.   Medications reviewed and include lasix.   No results found for: "HGBA1C" PTA DM medications are 10 mg jardiance daily .   Labs reviewed: Na: 133, CBGS: 94 (inpatient orders for glycemic control are none).    Diet Order:   Diet Order             Diet heart healthy/carb modified Room service appropriate? Yes; Fluid  consistency: Thin  Diet effective now                   EDUCATION NEEDS:   No education needs have been identified at this time  Skin:  Skin Assessment: Reviewed RN Assessment  Last BM:  05/13/23  Height:   Ht Readings from Last 1 Encounters:  05/14/23 5\' 8"  (1.727 m)    Weight:   Wt Readings from Last 1 Encounters:  05/15/23 76.2 kg    Ideal Body Weight:  70 kg  BMI:  Body mass index is 25.56 kg/m.  Estimated Nutritional Needs:   Kcal:  1900-2100  Protein:  100-115 grams  Fluid:  1.9-2.1 L    Levada Schilling, RD, LDN, CDCES Registered Dietitian III Certified Diabetes Care and Education Specialist Please refer to [facility] for RD and/or RD on-call/weekend/after hours pager

## 2023-05-15 NOTE — Progress Notes (Signed)
Hypoglycemic Event  CBG: 58  Treatment: D50 25 mL (12.5 gm)  Symptoms:  Drowsy, lethargic  Follow-up CBG: Time:1619 CBG Result:221  Possible Reasons for Event: Inadequate meal intake  Comments/MD notified:Jeremiah Elvina Sidle, NP    Dewitt Rota Oluwaseun Cremer

## 2023-05-15 NOTE — Discharge Instructions (Signed)

## 2023-05-15 NOTE — Plan of Care (Signed)
  Problem: Cardiac: Goal: Ability to achieve and maintain adequate cardiopulmonary perfusion will improve Outcome: Progressing   Problem: Pain Managment: Goal: General experience of comfort will improve Outcome: Progressing   Problem: Elimination: Goal: Will not experience complications related to bowel motility Outcome: Progressing   Problem: Safety: Goal: Ability to remain free from injury will improve Outcome: Progressing   Problem: Skin Integrity: Goal: Risk for impaired skin integrity will decrease Outcome: Progressing

## 2023-05-15 NOTE — Progress Notes (Signed)
[facility] HF Stewardship  PCP: [name], MD  PCP-Cardiologist: Olga Millers, MD  HPI: [name] is a 77 y.o. male with combined systolic and diastolic heart failure, stage III CKD, atrial fibrillation, PVCs, GERD  who presented with worsening PND and orthopnea over several days. Patient has failed DCCV and is being considered for ablation. Troponin on admission was mildly elevated.  [phone]). BNP significantly elevated to 4176.1. Patient presented with AKI that has continued to progress. Liver enzymes were elevated on admission and continue to trend upward now with AST of 4521 and ALT of 2191.   Pertinent cardiac history: TTE 04/2015 showed LVEF of 45-50% with G1DD and moderate TR. TTE in 10/2016 showed LVEF reduced to 35-40%, G1DD< mild AR, mild MR. LVEF unchanged in 2019 and 2021. TTE in 2022 showed LVEF reduced to 25-30% with G1DD. SPECT Myo scan in 05/2021 showed evidence of prior MI, but no peri-infarct ischemia.Marland Kitchen LVEF unchanged 10/2023, but noted to have G2DD. DCCV attempted 03/2023.  Pertinent Lab Values: Creat  Date Value Ref Range Status  07/17/2014 1.62 (H) 0.50 - 1.35 mg/dL Final   Creatinine, Ser  Date Value Ref Range Status  05/15/2023 3.01 (H) 0.61 - 1.24 mg/dL Final   BUN  Date Value Ref Range Status  05/15/2023 33 (H) 8 - 23 mg/dL Final  13/24/4010 11 8 - 27 mg/dL Final  27/25/3664 13 7 - 18 mg/dL Final   Potassium  Date Value Ref Range Status  05/15/2023 4.9 3.5 - 5.1 mmol/L Final  03/09/2014 3.9 3.5 - 5.1 mmol/L Final   Sodium  Date Value Ref Range Status  05/15/2023 133 (L) 135 - 145 mmol/L Final  04/17/2021 140 134 - 144 mmol/L Final  03/09/2014 141 136 - 145 mmol/L Final   Brain Natriuretic Peptide  Date Value Ref Range Status  09/18/2014 86.3 0.0 - 100.0 pg/mL Final   B Natriuretic Peptide  Date Value Ref Range Status  05/14/2023 4,176.1 (H) 0.0 - 100.0 pg/mL Final    Comment:    Performed at [facility], [address] [place], [place] [address]   Magnesium  Date Value Ref Range Status  03/08/2023 2.2 1.7 - 2.4 mg/dL Final    Comment:    Performed at [facility], [address] [place], [place] [address]   TSH  Date Value Ref Range Status  05/09/2018 5.535 (H) 0.350 - 4.500 uIU/mL Final    Comment:    Performed by a 3rd Generation assay with a functional sensitivity of <=0.01 uIU/mL. Performed at [facility], [address] [place], [place] [address]   04/24/2015 1.63 0.35 - 4.50 uIU/mL Final    Vital Signs: Temp:  [97.6 F (36.4 C)-97.9 F (36.6 C)] 97.6 F (36.4 C) (09/27 0744) Pulse Rate:  [80-102] 90 (09/27 0744) Cardiac Rhythm: Normal sinus rhythm;Bundle branch block (09/27 0744) Resp:  [15-49] 18 (09/27 0744) BP: (91-127)/(60-91) 93/61 (09/27 0744) SpO2:  [97 %-100 %] 100 % (09/27 0744) Weight:  [65 kg (143 lb 4.8 oz)] 65 kg (143 lb 4.8 oz) (09/26 0944)   Intake/Output Summary (Last 24 hours) at 05/15/2023 0805 Last data filed at 05/15/2023 0344 Gross per 24 hour  Intake 243 ml  Output 850 ml  Net -607 ml    Current Inpatient Medications:  -Furosemide infusion at 6 mg/hr -Other vasoactive medications: midodrine  Prior to admission HF Medications:  -Carvedilol 6.25 mg BID -Spironolactone 12.5 mg daily -Jardiance 10 mg daily -Furosemide 20 mg prn -  Other vasoactive medications: hydralazine 25 mg BID, isosorbide 30 mg daily  Assessment: 1. Acute on chronic systolic heart failure (LVEF 25-30%), due to NICM. NYHA class III-IV symptoms.  -Reports shortness of breath with minimal exertion requiring 2L O2 [place]. Reports mild LEE. Creatinine increased with diuresis and urine output remains poor. Expect AF is significantly contributing to symptoms. AF is rate controlled. Minimal urine output noted, furosemide transitioned to infusion. -Hypotensive this morning, GDMT held and started on midodrine. Given midodrine can increase SVR and decrease CO, will need to  monitor closely. -LFTs continue to trend up and CO2 is decreasing on BMP. No appetite and fingers were perfused, but colder than normal. May benefit from checking lactic acid to ensure patient is not headed towards decompensation.  Plan: 1) Medication changes recommended at this time: -Consider checking lactic acid  2) Patient assistance: -Pending  3) Education: - Patient has been educated on current HF medications and potential additions to HF medication regimen - Patient verbalizes understanding that over the next few months, these medication doses may change and more medications may be added to optimize HF regimen - Patient has been educated on basic disease state pathophysiology and goals of therapy  Medication Assistance / Insurance Benefits Check:  Does the patient have prescription insurance?  [employer]  Type of insurance plan:   Does the patient qualify for medication assistance through manufacturers or grants? Pending   Outpatient Pharmacy:  Prior to admission outpatient pharmacy: [facility]     Thank you for involving pharmacy in this patient's care.  Enos Fling, PharmD, BCPS Phone - [phone] Clinical Pharmacist 05/15/2023 11:31 AM

## 2023-05-15 NOTE — Progress Notes (Signed)
Pharmacy Consult for Milrinone (Primacor) Initiation  Indication:   Acute Decompensated Heart Failure with volume overload and low cardiac output  Assessment:  [name] is a 77 y.o. male admitted 05/14/2023 with acute decompensated congestive heart failure. Pharmacy has been asked to assist with milrinone initiation for inotropic support. Pt has an EF of 25-30%. Current renal function is 20.5 mL/min by CrCl or 21 mL/min by CKD-EPI, and most recent potassium is 4.9 and magnesium is pending. Pt is currently being diuresed with furosemide IV infusion at 6 mg/hr. Most recent SvO2 is pending.   VITALS  Temp:  [97.6 F (36.4 C)-97.9 F (36.6 C)] 97.8 F (36.6 C) (09/27 1132) Pulse Rate:  [80-102] 90 (09/27 1132) Cardiac Rhythm: Atrial fibrillation;Bundle branch block (09/27 0900) Resp:  [16-25] 16 (09/27 1132) BP: (91-123)/(54-91) 96/54 (09/27 1132) SpO2:  [97 %-100 %] 100 % (09/27 1132) Weight:  [76.2 kg (168 lb 1.6 oz)] 76.2 kg (168 lb 1.6 oz) (09/27 0728)  LABS    Component Value Date/Time   NA 133 (L) 05/15/2023 0311   NA 140 04/17/2021 0707   NA 141 03/09/2014 0921   K 4.9 05/15/2023 0311   K 3.9 03/09/2014 0921   CL 98 05/15/2023 0311   CL 107 03/09/2014 0921   CO2 17 (L) 05/15/2023 0311   CO2 27 03/09/2014 0921   GLUCOSE 91 05/15/2023 0311   GLUCOSE 87 03/09/2014 0921   BUN 33 (H) 05/15/2023 0311   BUN 11 04/17/2021 0707   BUN 13 03/09/2014 0921   CREATININE 3.01 (H) 05/15/2023 0311   CREATININE 1.62 (H) 07/17/2014 0803   CALCIUM 8.3 (L) 05/15/2023 0311   CALCIUM 8.3 (L) 03/09/2014 0921   GFRNONAA 21 (L) 05/15/2023 0311   GFRNONAA 44 (L) 07/17/2014 0803   GFRAA 49 (L) 01/30/2020 0704   GFRAA 50 (L) 07/17/2014 0803   Last magnesium:  Lab Results  Component Value Date   MG 2.2 03/08/2023   Estimated Creatinine Clearance: 20.5 mL/min (A) (by C-G formula based on SCr of 3.01 mg/dL (H)). Serum creatinine: 3.01 mg/dL (H) 16/10/96 0454 Estimated creatinine  clearance: 20.5 mL/min (A) estimated creatinine clearance is 20.5 mL/min (A) (by C-G formula based on SCr of 3.01 mg/dL (H)).  Plan:  1. Milrinone has been initiated per cardiology at 0.25 mcg/kg/min. Will monitor closely given renal function. 2. Nursing to monitor vital signs per milrinone protocol and physician parameters. 3. Pharmacy to follow peripherally, please reconsult if needed or if there are further questions. 4. Please contact MD for further dosing instructions.  Patient is being transferred to the ICU while awaiting transfer to [facility].   Thank you for involving pharmacy in this patient's care.  Enos Fling, PharmD, BCPS Phone - [phone] Clinical Pharmacist 05/15/2023 3:38 PM

## 2023-05-15 NOTE — Progress Notes (Addendum)
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 Angioedema   Aspirin Other (See Comments)    Other Reaction: Other reaction   Indocin [Indomethacin] Other (See Comments)   Lactase-Lactobacillus Other (See Comments)   Lisinopril Other (See Comments)   Mobic [Meloxicam] Other (See Comments)   Naproxen Other (See Comments)   Nsaids Other (See Comments)    Other Reaction: Other reaction     Patient Measurements: Height: 5\' 8"  (172.7 cm) Weight: 76.2 kg (167 lb 15.9 oz) IBW/kg (Calculated) : 68.4  Vital Signs: Temp: 97.9 F (36.6 C) (09/27 2007) Temp Source: Oral (09/27 2007) BP: 100/55 (09/27 2330) Pulse Rate: 84 (09/27 2330)  Labs: Recent Labs    05/14/23 0946 05/14/23 1201 05/14/23 1742 05/14/23 1932 05/14/23 2123 05/15/23 0311  HGB 13.0  --   --   --   --  12.9*  HCT 40.4  --   --   --   --  39.7  PLT 267  --   --   --   --  223  CREATININE 2.25*  --  2.32*  --   --  3.01*  TROPONINIHS 44*   < > 71* 53* 82*  --    < > = values in this interval not displayed.    Estimated Creatinine Clearance: 20.5 mL/min (A) (by C-G formula based on SCr of 3.01 mg/dL (H)).   Medical History: Past Medical History:  Diagnosis Date   Abnormal chest CT    Anaphylactic reaction    Anxiety    Asthma    Benign hypertension    Cardiomyopathy    Cardiomyopathy, idiopathic (HCC)    CHF (congestive heart failure) (HCC)    Chronic renal insufficiency    With creatinine 1.4   Complication of anesthesia    Drug-induced urticaria    FH: thoracic aortic aneurysm    GERD (gastroesophageal reflux disease)    History of colon polyps    Hives    Long Hx of hives   Hyperlipidemia     Hyperlipidemia    IMPLANTATION OF DEFIBRILLATOR, HX OF 02/15/2009   Qualifier: Diagnosis of  By: Jens Som, MD, Lyn Hollingshead    NSVT (nonsustained ventricular tachycardia) (HCC)    Osteoarthritis    Osteoarthrosis, unspecified whether generalized or localized, lower leg    Osteoporosis    Oxygen deficiency    Psoriasis    PVC (premature ventricular contraction)    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 90 tablet 1    carvedilol (COREG) 6.25 MG tablet Hold due to hypotension      cetirizine (ZYRTEC) 10 MG tablet Take 10 mg by mouth daily.      empagliflozin (JARDIANCE) 10 MG TABS tablet Hold.      EPINEPHrine 0.3 mg/0.3 mL IJ SOAJ injection Inject 0.3 mg into the muscle as needed for  anaphylaxis.      famotidine (PEPCID) 20 MG tablet Take 20 mg by mouth daily.      [START ON 05/16/2023] feeding supplement (ENSURE ENLIVE / ENSURE PLUS) LIQD Take 237 mLs by mouth 2 (two) times daily between meals.      furosemide 200 mg in dextrose 5 % 80 mL Inject 6 mg/hr into the vein continuous.      hydrALAZINE (APRESOLINE) 25 MG tablet Hold due to hypotension.      isosorbide mononitrate (IMDUR) 30 MG 24 hr tablet Hold due to hypotension.      Menthol-Methyl Salicylate (SALONPAS PAIN RELIEF PATCH) 3-10 % PTCH Apply 1 application  topically daily.      [START ON 05/16/2023] midodrine (PROAMATINE) 5 MG tablet Take 1 tablet (5 mg total) by mouth 3 (three) times daily with meals.      milrinone (PRIMACOR) 20 MG/100 ML SOLN infusion Inject 0.0191 mg/min into the vein continuous.      [START ON 05/16/2023] Multiple Vitamin (MULTIVITAMIN WITH MINERALS) TABS tablet Take 1 tablet  by mouth daily.      spironolactone (ALDACTONE) 25 MG tablet Hold due to hypotension.      Scheduled:   Chlorhexidine Gluconate Cloth  6 each Topical Daily   [START ON 05/16/2023] feeding supplement  237 mL Oral BID BM   [START ON 05/16/2023] midodrine  5 mg Oral TID WC   pantoprazole (PROTONIX) IV  40 mg Intravenous Q12H   sodium chloride flush  3 mL Intravenous Q12H   Infusions:   sodium chloride     furosemide (LASIX) 200 mg in dextrose 5 % 100 mL (2 mg/mL) infusion 6 mg/hr (05/15/23 2300)   milrinone 0.25 mcg/kg/min (05/15/23 2300)    Assessment: 77yo male tx'd from [facility] for acute on chronic HF and initiation of milrinone, to transition from Eliquis for Afib (last dose 9/27 am) to argatroban (pt with alpha-gal anaphylaxis); hematuria had resolved when pt arrived at CICU.  Goal of Therapy:  aPTT 50-90 seconds Monitor platelets by anticoagulation protocol: Yes   Plan:  Start argatroban at 0.5 mcg/kg/min. Monitor PTT and CBC.  Vernard Gambles, PharmD, BCPS  05/15/2023,11:47 PM

## 2023-05-15 NOTE — Progress Notes (Signed)
Date and time results received: 05/15/23 1448   Test: lactic acid Critical Value: 4.7  Name of Provider Notified: Dr. Fran Lowes

## 2023-05-15 NOTE — Progress Notes (Signed)
Patient transported to [facility] via care link, vitals WNL, 4L [place],patient Aox4, Lasix and milrinone gtt infusing, 2 PIV's present. Unsuccessful attempt at [facility] unable to do 2nd attempt due to transfer in progress. Patient urinated 20cc of clear urine, and then 20cc of bright red blood, MD notified. Patient safely and successfully left [facility] ICU via carelink

## 2023-05-15 NOTE — H&P (Addendum)
Cardiology Admission History and Physical   Patient ID: [name] MRN: [id number]; DOB: 09/25/1945   Admission date: (Not on file)  PCP:  [name], MD   [facility] Providers Cardiologist:  Olga Millers, MD   {   Chief Complaint:  shortness of breath  Patient Profile:   [name] is a 77 y.o. 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  History of Present Illness:   [name] follows with [name].  He has a long history of HFrEF with cardiac cath at [facility] showed normal coronaries and an EF of 24. Endocardial biopsy was unrevealing. It was felt CM was possible secondary to ventricular ectopy and has been on amiodarone. He previously had an ICD generator (removed in February 2014 as LV function improved. Echo in August 2021 showed LVEF 35-40%, G1DD. Nuclear study in October 2022 showed EF 39%, prior infarct, but no ischemia. Repeat echo 10/2022 showed LVEF 25-30%. Patient went tot he ED 02/2023 with chest pain, found to have new onset Afib. He was started on Eliquis and referred to the Afib clinc.   Patient underwent elective cardioversion in late August and initially felt better.  However, he became concerned that he may have gone back into A-fib due to progressive fatigue and exertional dyspnea.  He also reported intermittent dizziness.  He saw [name] in the office 05/12/2023 and was noted to be back in A-flutter.  It was felt he would not to maintain sinus rhythm with repeat cardioversion given that he has been on amiodarone for quite some time.  It was recommended that he proceed with the EP consult for ablation.  He was also referred to the heart failure clinic for progressive CHF.  Repeat echo was ordered.  Patient presented to [facility] ED on 05/14/2023 with weakness,  progressively worsening shortness of breath.  No chest pain was reported.  In the ER patient was afebrile, hemodynamically stable.  Labs showed troponin in the 40s, serum creatinine 2.25, elevated AST and ALT, BNP 4200.  EKG showed atrial flutter with a heart rate of 94 bpm, PVC. chest x-ray consistent with heart failure and pulmonary edema.  Patient was started on IV Lasix and admitted for further workup   Past Medical History:  Diagnosis Date   Abnormal chest CT    Anaphylactic reaction    Anxiety    Asthma    Benign hypertension    Cardiomyopathy    Cardiomyopathy, idiopathic (HCC)    CHF (congestive heart failure) (HCC)    Chronic renal insufficiency    With creatinine 1.4   Complication of anesthesia    Drug-induced urticaria    FH: thoracic aortic aneurysm    GERD (gastroesophageal reflux disease)    History of colon polyps    Hives    Long Hx of hives   Hyperlipidemia    Hyperlipidemia    IMPLANTATION OF DEFIBRILLATOR, HX OF 02/15/2009   Qualifier: Diagnosis of  By: Jens Som, MD, Lyn Hollingshead    NSVT (nonsustained ventricular tachycardia) (HCC)    Osteoarthritis    Osteoarthrosis, unspecified whether generalized or localized, lower leg    Osteoporosis    Oxygen deficiency    Psoriasis    PVC (premature ventricular contraction)    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    Past Surgical  History:  Procedure Laterality Date   APPENDECTOMY     BIV ICD GENERTAOR CHANGE OUT N/A 10/08/2012   Procedure: BIV ICD GENERTAOR CHANGE OUT;  Surgeon: Marinus Maw, MD;  Location: [facility] CATH LAB;  Service: Cardiovascular;  Laterality: N/A;   CARDIAC CATHETERIZATION     CARDIAC DEFIBRILLATOR REMOVAL  2014   CARDIOVERSION N/A 04/07/2023   Procedure: CARDIOVERSION;  Surgeon: Chilton Si, MD;  Location: [facility] INVASIVE CV LAB;  Service: Cardiovascular;  Laterality: N/A;   CATARACT EXTRACTION     CATARACT EXTRACTION W/PHACO Left 03/12/2016   Procedure: CATARACT EXTRACTION  PHACO AND INTRAOCULAR LENS PLACEMENT (IOC);  Surgeon: Sallee Lange, MD;  Location: [facility] ORS;  Service: Ophthalmology;  Laterality: Left;  [place] 01:32 AP% 25.7 CDE 41.33 fluid pack lot # [id number]   COLONOSCOPY WITH PROPOFOL N/A 04/26/2019   Procedure: COLONOSCOPY WITH PROPOFOL;  Surgeon: Christena Deem, MD;  Location: [facility] ENDOSCOPY;  Service: Endoscopy;  Laterality: N/A;   ESOPHAGOGASTRODUODENOSCOPY     EYE SURGERY     Implantation of dual-chamber ICD.     [employer] Jude DDD-ICD 1/07   JOINT REPLACEMENT Right 08/16/2009   KNEE ARTHROPLASTY Left 2006   KNEE ARTHROPLASTY Right 12/10   x2   LAPAROSCOPIC APPENDECTOMY N/A 08/12/2018   Procedure: APPENDECTOMY LAPAROSCOPIC;  Surgeon: Henrene Dodge, MD;  Location: [facility] ORS;  Service: General;  Laterality: N/A;   TONSILLECTOMY       Medications Prior to Admission: Prior to Admission medications   Medication Sig Start Date End Date Taking? Authorizing Provider  amiodarone (PACERONE) 200 MG tablet TAKE 1 TABLET BY MOUTH EVERY DAY 04/22/23   Lewayne Bunting, MD  apixaban (ELIQUIS) 5 MG TABS tablet Take 1 tablet (5 mg total) by mouth 2 (two) times daily. 03/11/23   Carlos Levering, NP  atorvastatin (LIPITOR) 10 MG tablet Take 1 tablet (10 mg total) by mouth daily. 12/23/21   Lewayne Bunting, MD  carvedilol (COREG) 6.25 MG tablet TAKE 1 TABLET BY MOUTH TWICE A DAY 03/19/23   Lewayne Bunting, MD  cetirizine (ZYRTEC) 10 MG tablet Take 10 mg by mouth daily.    [provider]  empagliflozin (JARDIANCE) 10 MG TABS tablet Take 1 tablet (10 mg total) by mouth daily before breakfast. 05/12/23   Lewayne Bunting, MD  EPINEPHrine 0.3 mg/0.3 mL IJ SOAJ injection Inject 0.3 mg into the muscle as needed for anaphylaxis.    [provider]  famotidine (PEPCID) 20 MG tablet Take 20 mg by mouth daily. 11/02/18   [provider]  furosemide (LASIX) 20 MG tablet Take 20 mg by mouth daily as needed for edema. 05/13/23 05/12/24  [provider]  hydrALAZINE (APRESOLINE) 25 MG tablet Take 12.5 mg by mouth in the morning and at bedtime. 05/13/23 05/12/24  [provider]  hydrALAZINE (APRESOLINE) 50 MG tablet Take 0.5 tablets (25 mg total) by mouth 2 (two) times daily. Take 1/2 tablet BID Patient not taking: Reported on 05/14/2023 11/04/22   Lewayne Bunting, MD  isosorbide mononitrate (IMDUR) 30 MG 24 hr tablet Take 1 tablet (30 mg total) by mouth daily. 03/21/13 05/14/23  Lewayne Bunting, MD  Menthol-Methyl Salicylate (SALONPAS PAIN RELIEF PATCH) 3-10 % PTCH Apply 1 application  topically daily.    [provider]  predniSONE (DELTASONE) 10 MG tablet Take 1 tablet (10 mg total) by mouth as directed. 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   Pork-Derived Products Anaphylaxis   Ace Inhibitors Other (See Comments)    Other Reaction: Other reaction/ Urticaria secondary to COX-1 inhibitors Angioedema   Aspirin Other (See Comments)    Other Reaction: Other reaction   Indocin [Indomethacin] Other (See Comments)   Lactase-Lactobacillus Other (See Comments)   Lisinopril Other (See Comments)   Mobic [Meloxicam] Other (See Comments)   Naproxen Other (See Comments)   Nsaids Other (See Comments)    Other Reaction: Other reaction     Social History:   Social History   Socioeconomic History   Marital status: Married    Spouse name: Not on file   Number of children: Not on file   Years of education: Not on file   Highest education level: Not on file  Occupational History   Not on file  Tobacco Use   Smoking status: Former    Current packs/day: 0.00    Average packs/day: 1 pack/day for 10.0 years (10.0 ttl pk-yrs)    Types: Cigarettes    Start date:  12/30/1960    Quit date: 12/31/1970    Years since quitting: 52.4   Smokeless tobacco: Never  Vaping Use   Vaping status: Never Used  Substance and Sexual Activity   Alcohol use: Yes    Alcohol/week: 0.0 standard drinks of alcohol    Comment: 2 DRINKS PER DAY, none this week (7/26)   Drug use: Never   Sexual activity: Not Currently  Other Topics Concern   Not on file  Social History Narrative   Not on file   Social Determinants of Health   Financial Resource Strain: Low Risk  (11/05/2022)   Received from [facility], [facility]   Overall Financial Resource Strain (CARDIA)    Difficulty of Paying Living Expenses: Not hard at all  Food Insecurity: No Food Insecurity (05/14/2023)   Hunger Vital Sign    Worried About Running Out of Food in the Last Year: Never true    Ran Out of Food in the Last Year: Never true  Transportation Needs: No Transportation Needs (05/14/2023)   PRAPARE - Administrator, Civil Service (Medical): No    Lack of Transportation (Non-Medical): No  Physical Activity: Unknown (08/12/2018)   Exercise Vital Sign    Days of Exercise per Week: Patient declined    Minutes of Exercise per Session: Patient declined  Stress: No Stress Concern Present (08/12/2018)   [employer] of Occupational Health - Occupational Stress Questionnaire    Feeling of Stress : Not at all  Social Connections: Unknown (08/12/2018)   Social Connection and Isolation Panel [NHANES]    Frequency of Communication with Friends and Family: Patient declined    Frequency of Social Gatherings with Friends and Family: Patient declined    Attends Religious Services: Patient declined    Database administrator or Organizations: Patient declined    Attends Banker Meetings: Patient declined    Marital Status: Patient declined  Intimate Partner Violence: Not At Risk (05/14/2023)   Humiliation, Afraid, Rape, and Kick questionnaire    Fear  of Current or Ex-Partner: No    Emotionally Abused: No    Physically Abused: No    Sexually Abused: No    Family History:  The patient's family history includes Cancer in his mother; Heart attack in his father; Lung cancer in his mother.    ROS:  Please see the history of present illness.  All other ROS reviewed and negative.     Physical Exam/Data:  There were no vitals filed for this visit.  Intake/Output Summary (Last 24 hours) at 05/15/2023 1535 Last data filed at 05/15/2023 1500 Gross per 24 hour  Intake 243 ml  Output 850 ml  Net -607 ml      05/15/2023    7:28 AM 05/14/2023    9:44 AM 05/12/2023    7:48 AM  Last 3 Weights  Weight (lbs) 168 lb 1.6 oz 143 lb 4.8 oz 165 lb  Weight (kg) 76.25 kg 65 kg 74.844 kg     There is no height or weight on file to calculate BMI.  General:  chronically ill appearing HEENT: normal Neck: + JVD Vascular: No carotid bruits; Distal pulses 2+ bilaterally   Cardiac:  normal S1, S2; RRR; + murmur Lungs:  diminished breath sounds  Abd: soft, nontender, no hepatomegaly  Ext: 1+ lower extremity edema Musculoskeletal:  No deformities, BUE and BLE strength normal and equal Skin: warm and dry  Neuro:  CNs 2-12 intact, no focal abnormalities noted Psych:  Normal affect    EKG:  The ECG that was done 05/14/23 was personally reviewed and demonstrates Aflutter 94bpm, PVC, nonspecific T wave changes  Relevant CV Studies:  Echo 10/2022 1. Left ventricular ejection fraction, by estimation, is 25 to 30%. Left  ventricular ejection fraction by 2D MOD biplane is 30.4 %. The left  ventricle has severely decreased function. The left ventricle demonstrates  global hypokinesis. There is mild  left ventricular hypertrophy. Left ventricular diastolic parameters are  consistent with Grade II diastolic dysfunction (pseudonormalization). The  average left ventricular global longitudinal strain is -11.8 %.   2. Right ventricular systolic function is  normal. The right ventricular  size is normal. There is moderately elevated pulmonary artery systolic  pressure. The estimated right ventricular systolic pressure is 57.6 mmHg.   3. Left atrial size was severely dilated.   4. The mitral valve is normal in structure. Moderate mitral valve  regurgitation. No evidence of mitral stenosis.   5. Tricuspid valve regurgitation is moderate.   6. The aortic valve has an indeterminant number of cusps. Aortic valve  regurgitation is mild to moderate. No aortic stenosis is present.   7. There is mild dilatation of the aortic root, measuring 44 mm. There is  mild dilatation of the ascending aorta, measuring 42 mm.   8. The inferior vena cava is dilated in size with >50% respiratory  variability, suggesting right atrial pressure of 8 mmHg.   Comparison(s): Previous LVEF reported as 25-30%.   Myoview lexiscan 2022   Findings are consistent with prior myocardial infarction with no evidence of peri-infarct ischemia. The study is intermediate risk due to moderately depressed ejection fraction.   No ST deviation was noted.   LV perfusion is abnormal. Defect 1: There is a medium defect with moderate reduction in uptake present in the mid to basal inferior location(s) that is fixed. There is abnormal wall motion in the defect area. Consistent with infarction.   Left ventricular function is abnormal. Global function is moderately reduced. Nuclear stress EF: 39 %. The left ventricular ejection fraction is moderately decreased (30-44%). End diastolic cavity size is severely enlarged.   Prior study not available for comparison.  Conclusion: There is evidence of prior myocardial infarction with no peri-infarct ischemia.  Echo 04/2021  1. Left ventricular ejection fraction, by estimation, is 25 to 30%. The  left ventricle has severely decreased function. The left ventricle  demonstrates global hypokinesis. The left ventricular internal cavity size  was  moderately to severely dilated.  Left ventricular diastolic parameters are consistent with Grade I  diastolic dysfunction (impaired relaxation). Elevated left ventricular  end-diastolic pressure.   2. Right ventricular systolic function is normal. The right ventricular  size is moderately enlarged.   3. Left atrial size was mildly dilated.   4. The mitral valve is degenerative. Mild mitral valve regurgitation. No  evidence of mitral stenosis.   5. Tricuspid valve regurgitation is mild to moderate.   6. The aortic valve is tricuspid. Aortic valve regurgitation is mild to  moderate. Mild to moderate aortic valve sclerosis/calcification is  present, without any evidence of aortic stenosis. Aortic regurgitation PHT  measures 652 msec. Aortic valve area,  by VTI measures 2.08 cm. Aortic valve mean gradient measures 5.5 mmHg.  Aortic valve Vmax measures 1.58 m/s.   7. Aortic dilatation noted. There is mild dilatation of the aortic root,  measuring 41 mm. There is mild dilatation of the ascending aorta,  measuring 43 mm.   8. Compared to echo 03/28/2020, LVF appears to have declined (EF 35-40%  prior) and ascending aortic diameter has increased from 42 to 43mm.   Echo 2021 1. Left ventricular ejection fraction, by estimation, is 35 to 40%. The  left ventricle has moderately decreased function. The left ventricle  demonstrates global hypokinesis. The left ventricular internal cavity size  was mildly dilated. There is mild  left ventricular hypertrophy. Left ventricular diastolic parameters are  consistent with Grade I diastolic dysfunction (impaired relaxation).   2. Right ventricular systolic function is normal. The right ventricular  size is normal. There is moderately elevated pulmonary artery systolic  pressure. The estimated right ventricular systolic pressure is 47.7 mmHg.   3. Left atrial size was moderately dilated.   4. The mitral valve is normal in structure. No evidence of mitral  valve  regurgitation. No evidence of mitral stenosis.   5. Tricuspid valve regurgitation is mild to moderate.   6. The aortic valve is tricuspid. Aortic valve regurgitation is mild to  moderate. Mild aortic valve sclerosis is present, with no evidence of  aortic valve stenosis.   7. Aortic dilatation noted. There is mild dilatation of the ascending  aorta measuring 42 mm.   8. The inferior vena cava is dilated in size with >50% respiratory  variability, suggesting right atrial pressure of 8 mmHg.   Laboratory Data:  High Sensitivity Troponin:   Recent Labs  Lab 05/14/23 0946 05/14/23 1201 05/14/23 1742 05/14/23 1932 05/14/23 2123  TROPONINIHS 44* 46* 71* 53* 82*      Chemistry Recent Labs  Lab 05/14/23 1742 05/15/23 0311  NA 134* 133*  K 4.3 4.9  CL 102 98  CO2 18* 17*  GLUCOSE 104* 91  BUN 27* 33*  CREATININE 2.32* 3.01*  CALCIUM 8.3* 8.3*  GFRNONAA 29* 21*  ANIONGAP 14 18*    Recent Labs  Lab 05/14/23 0946 05/15/23 0311  PROT 6.4* 6.1*  ALBUMIN 3.1* 3.2*  AST 235* 4,521*  ALT 138* 2,191*  ALKPHOS 125 120  BILITOT 2.1* 3.8*   Lipids No results for input(s): "CHOL", "TRIG", "HDL", "LABVLDL", "LDLCALC", "CHOLHDL" in the last 168 hours. Hematology Recent Labs  Lab 05/14/23 [phone]  05/15/23 0311  WBC 7.7 13.8*  RBC 4.05* 4.01*  HGB 13.0 12.9*  HCT 40.4 39.7  MCV 99.8 99.0  MCH 32.1 32.2  MCHC 32.2 32.5  RDW 13.6 13.7  PLT 267 223   Thyroid No results for input(s): "TSH", "FREET4" in the last 168 hours. BNP Recent Labs  Lab 05/14/23 0946  BNP 4,176.1*    DDimer No results for input(s): "DDIMER" in the last 168 hours.   Radiology/Studies:  No results found.   Assessment and Plan:   Acute on chronic systolic heart failure NICM - long history of NICM/HFrEF with initially improved LVEF - Echo 2016 LVEF 45-50%, G1DD - Echo 2018 LVEF 35-40%, G1DD - Echo 2022 25-30%, G1DD - Echo 10/2022 LVEF 25-30%, mild LVH, G2DD - patient diagnosed with new  afib/flutter in 02/2023, has been difficult to maintain NSR - patient presented with SOB, weakness. Was started on lasix recently as OP - BNP 4176, LA 4.7 - IV lasix drip and milrinone - midodrine for soft pressure - plan to transfer to [facility] to be on advanced heart failure service  Persistent afib/flutter - s/p cardioversion in 04/07/23 with conversion back to afib/flutter at follow-up - hold amiodarone given abnormal LFTs - Eliquis held for procedures>IV heparin - avoid AV nodal blocking agents due to low-output heart failure - now in rate controlled Aflutter - plan was for outpatient EP evaluation  AKI on CKD - suspect cardiorenal - Scr 1,71>3.23>3.01 - daily BMET  PVCs - amiodarone held as above  Code Status: Full Code  Severity of Illness: The appropriate patient status for this patient is INPATIENT. Inpatient status is judged to be reasonable and necessary in order to provide the required intensity of service to ensure the patient's safety. The patient's presenting symptoms, physical exam findings, and initial radiographic and laboratory data in the context of their chronic comorbidities is felt to place them at high risk for further clinical deterioration. Furthermore, it is not anticipated that the patient will be medically stable for discharge from the hospital within 2 midnights of admission.   * I certify that at the point of admission it is my clinical judgment that the patient will require inpatient hospital care spanning beyond 2 midnights from the point of admission due to high intensity of service, high risk for further deterioration and high frequency of surveillance required.*   For questions or updates, please contact [facility] Please consult [email or web address] for contact info under     Signed, Cadence David Stall, PA-C  05/15/2023 3:35 PM     Brief H&P Note:  HPI:  [name] is a 77 year old male with a significant past medical history of non-ischemic  cardiomyopathy diagnosed in 2004, persistent atrial fibrillation, thoracic aortic aneurysm, hypertension, chronic kidney disease, and hyperlipidemia who was transferred from [facility] for advanced heart failure care.  [name] presented to [facility] on 05/14/23 with worsening shortness of breath.  Initial ECG at his admission was reportedly concerning for atrial flutter, but upon further review, possibly course atrial fibrillation.  Initial work up revealed an elevated lactic acid and worsening renal function.  Patient was transferred to our facility for advanced heart failure care.  Patient was placed on milrinone.  Patient appears stable at this time and denies any active symptoms.     Physical examination: Today's Vitals   05/15/23 1930 05/15/23 2007 05/15/23 2110  BP: 118/74  113/62  Pulse:   87  Resp: (!) 30  18  Temp:  97.9 F (36.6 C)   TempSrc:  Oral   SpO2:   95%   There is no height or weight on file to calculate BMI.   Gen: Normal appearing, no acute distress. Neck: JVD >10 cm above the right atrium. Chest:  Clear to auscultation. Card: Irregular, normal S1/S2; no murmurs or rubs. Extremities: Trace edema; warm to touch.  Assessment/Plan: # Heart failure with reduced ejection fraction: Currently with acute heart failure syndrome and signs of cardiogenic shock.  Patient was initiated on milrinone drip and IV furosemide drip.  Currently hemodynamically stable. --Continue IV furosemide drip and milrinone. --Double lumen PICC ordered for medication administration. --Repeat lactic acid to understand trend.  #Persistent atrial fibrillation Patient status post cardioversion in 04/07/23 with conversion back to afib/flutter at follow-up.  ECG and telemetry confirms atrial fibrillation.  Amiodarone was held due to elevated liver function tests.  Patient placed on IV heparin for thrombo-embolism prevention. Currently rate  controlled. --No change in management in the acute setting.    # Acute on chronic kidney injury: Likely secondary to cardiorenal syndrome.  Creatinine improved slightly with addition of milrinone and furosemide drip. --Daily BMP/Mg --Continue current management.

## 2023-05-16 ENCOUNTER — Other Ambulatory Visit (HOSPITAL_COMMUNITY): Payer: Medicare Other

## 2023-05-16 ENCOUNTER — Encounter (HOSPITAL_COMMUNITY): Payer: Self-pay | Admitting: Cardiology

## 2023-05-16 ENCOUNTER — Inpatient Hospital Stay (HOSPITAL_COMMUNITY): Payer: Medicare Other

## 2023-05-16 DIAGNOSIS — I5021 Acute systolic (congestive) heart failure: Secondary | ICD-10-CM | POA: Diagnosis not present

## 2023-05-16 LAB — BASIC METABOLIC PANEL
Anion gap: 15 (ref 5–15)
Anion gap: 16 — ABNORMAL HIGH (ref 5–15)
BUN: 45 mg/dL — ABNORMAL HIGH (ref 8–23)
BUN: 48 mg/dL — ABNORMAL HIGH (ref 8–23)
CO2: 16 mmol/L — ABNORMAL LOW (ref 22–32)
CO2: 18 mmol/L — ABNORMAL LOW (ref 22–32)
Calcium: 7.2 mg/dL — ABNORMAL LOW (ref 8.9–10.3)
Calcium: 7.3 mg/dL — ABNORMAL LOW (ref 8.9–10.3)
Chloride: 98 mmol/L (ref 98–111)
Chloride: 95 mmol/L — ABNORMAL LOW (ref 98–111)
Creatinine, Ser: 4.56 mg/dL — ABNORMAL HIGH (ref 0.61–1.24)
Creatinine, Ser: 4.54 mg/dL — ABNORMAL HIGH (ref 0.61–1.24)
GFR, Estimated: 13 mL/min — ABNORMAL LOW (ref >60)
GFR, Estimated: 13 mL/min — ABNORMAL LOW (ref >60)
Glucose, Bld: 63 mg/dL — ABNORMAL LOW (ref 70–99)
Glucose, Bld: 179 mg/dL — ABNORMAL HIGH (ref 70–99)
Potassium: 4.1 mmol/L (ref 3.5–5.1)
Potassium: 3.6 mmol/L (ref 3.5–5.1)
Sodium: 129 mmol/L — ABNORMAL LOW (ref 135–145)
Sodium: 129 mmol/L — ABNORMAL LOW (ref 135–145)

## 2023-05-16 LAB — HEPATIC FUNCTION PANEL
ALT: 2577 U/L — ABNORMAL HIGH (ref 0–44)
AST: 6306 U/L — ABNORMAL HIGH (ref 15–41)
Albumin: 2.7 g/dL — ABNORMAL LOW (ref 3.5–5.0)
Alkaline Phosphatase: 131 U/L — ABNORMAL HIGH (ref 38–126)
Bilirubin, Direct: 1.8 mg/dL — ABNORMAL HIGH (ref 0.0–0.2)
Indirect Bilirubin: 1.9 mg/dL — ABNORMAL HIGH (ref 0.3–0.9)
Total Bilirubin: 3.7 mg/dL — ABNORMAL HIGH (ref 0.3–1.2)
Total Protein: 4.9 g/dL — ABNORMAL LOW (ref 6.5–8.1)

## 2023-05-16 LAB — CBC
HCT: 33.6 % — ABNORMAL LOW (ref 39.0–52.0)
Hemoglobin: 11.6 g/dL — ABNORMAL LOW (ref 13.0–17.0)
MCH: 33 pg (ref 26.0–34.0)
MCHC: 34.5 g/dL (ref 30.0–36.0)
MCV: 95.7 fL (ref 80.0–100.0)
Platelets: 160 10*3/uL (ref 150–400)
RBC: 3.51 MIL/uL — ABNORMAL LOW (ref 4.22–5.81)
RDW: 13.5 % (ref 11.5–15.5)
WBC: 9.6 10*3/uL (ref 4.0–10.5)
nRBC: 0 % (ref 0.0–0.2)

## 2023-05-16 LAB — CG4 I-STAT (LACTIC ACID): Lactic Acid, Venous: 1.7 mmol/L (ref 0.5–1.9)

## 2023-05-16 LAB — COOXEMETRY PANEL
Carboxyhemoglobin: 1.3 % (ref 0.5–1.5)
Carboxyhemoglobin: 0.9 % (ref 0.5–1.5)
Methemoglobin: <0.7 % (ref 0.0–1.5)
Methemoglobin: <0.7 % (ref 0.0–1.5)
O2 Saturation: 68 %
O2 Saturation: 67.2 %
Total hemoglobin: 11.2 g/dL — ABNORMAL LOW (ref 12.0–16.0)
Total hemoglobin: 11.4 g/dL — ABNORMAL LOW (ref 12.0–16.0)

## 2023-05-16 LAB — GLUCOSE, CAPILLARY
Glucose-Capillary: 126 mg/dL — ABNORMAL HIGH (ref 70–99)
Glucose-Capillary: 138 mg/dL — ABNORMAL HIGH (ref 70–99)
Glucose-Capillary: 161 mg/dL — ABNORMAL HIGH (ref 70–99)
Glucose-Capillary: 51 mg/dL — ABNORMAL LOW (ref 70–99)
Glucose-Capillary: 76 mg/dL (ref 70–99)
Glucose-Capillary: 82 mg/dL (ref 70–99)

## 2023-05-16 LAB — APTT
aPTT: 180 s (ref 24–36)
aPTT: 162 s (ref 24–36)
aPTT: 152 s — ABNORMAL HIGH (ref 24–36)

## 2023-05-16 LAB — LACTIC ACID, PLASMA
Lactic Acid, Venous: 1.5 mmol/L (ref 0.5–1.9)
Lactic Acid, Venous: 1.5 mmol/L (ref 0.5–1.9)

## 2023-05-16 LAB — MAGNESIUM: Magnesium: 1.9 mg/dL (ref 1.7–2.4)

## 2023-05-16 MED ORDER — SODIUM CHLORIDE 0.9% FLUSH: 10.0000 mL | INTRAVENOUS | Status: DC | PRN

## 2023-05-16 MED ORDER — SODIUM CHLORIDE 0.9 % IV SOLN
0.0500 mg/kg/h | INTRAVENOUS | Status: DC
  Administered 2023-05-16: 0.05 mg/kg/h via INTRAVENOUS
  Filled 2023-05-16: qty 250

## 2023-05-16 MED ORDER — POTASSIUM CHLORIDE CRYS ER 20 MEQ PO TBCR
20.0000 meq | EXTENDED_RELEASE_TABLET | Freq: Once | ORAL | Status: AC
  Administered 2023-05-16: 20 meq via ORAL
  Filled 2023-05-16: qty 1

## 2023-05-16 MED ORDER — LOPERAMIDE HCL 2 MG PO CAPS
2.0000 mg | ORAL_CAPSULE | ORAL | Status: DC | PRN
  Administered 2023-05-16: 2 mg via ORAL
  Filled 2023-05-16: qty 1

## 2023-05-16 MED ORDER — POTASSIUM CHLORIDE CRYS ER 20 MEQ PO TBCR: 40.0000 meq | EXTENDED_RELEASE_TABLET | Freq: Once | ORAL | Status: DC

## 2023-05-16 MED ORDER — FUROSEMIDE 10 MG/ML IJ SOLN
60.0000 mg | Freq: Once | INTRAMUSCULAR | Status: AC
  Administered 2023-05-16: 60 mg via INTRAVENOUS
  Filled 2023-05-16: qty 6

## 2023-05-16 MED ORDER — ARGATROBAN 50 MG/50ML IV SOLN
0.4000 ug/kg/min | INTRAVENOUS | Status: DC
  Filled 2023-05-16 (×2): qty 50

## 2023-05-16 MED ORDER — MILRINONE LACTATE IN DEXTROSE 20-5 MG/100ML-% IV SOLN
0.1250 ug/kg/min | INTRAVENOUS | Status: DC
  Administered 2023-05-16: 0.25 ug/kg/min via INTRAVENOUS
  Administered 2023-05-16: 0.125 ug/kg/min via INTRAVENOUS
  Administered 2023-05-17 – 2023-05-18 (×2): 0.25 ug/kg/min via INTRAVENOUS
  Filled 2023-05-16 (×5): qty 100

## 2023-05-16 MED ORDER — SODIUM CHLORIDE 0.9% FLUSH
10.0000 mL | Freq: Two times a day (BID) | INTRAVENOUS | Status: DC
  Administered 2023-05-16 – 2023-05-17 (×3): 10 mL
  Administered 2023-05-18: 20 mL
  Administered 2023-05-19 – 2023-05-23 (×9): 10 mL

## 2023-05-16 MED ORDER — MAGNESIUM SULFATE 2 GM/50ML IV SOLN
2.0000 g | Freq: Once | INTRAVENOUS | Status: AC
  Administered 2023-05-16: 2 g via INTRAVENOUS
  Filled 2023-05-16: qty 50

## 2023-05-16 MED ORDER — NOREPINEPHRINE 4 MG/250ML-% IV SOLN
0.0000 ug/min | INTRAVENOUS | Status: DC
  Administered 2023-05-16: 2 ug/min via INTRAVENOUS
  Administered 2023-05-16: 9 ug/min via INTRAVENOUS
  Administered 2023-05-17 (×2): 8 ug/min via INTRAVENOUS
  Administered 2023-05-18: 4 ug/min via INTRAVENOUS
  Filled 2023-05-16 (×4): qty 250

## 2023-05-16 NOTE — Progress Notes (Addendum)
ADVANCED HEART FAILURE PROGRESS  NOTE  Referring Physician: [name], MD  Primary Care: [name], MD Primary Cardiologist: Olga Millers, MD  HPI: [name] is a 77 y.o. male with chronic HFrEF secondary to nonischemic cardiomyopathy (diagnosed in 2004), persistent atrial fibrillation, hypertension, hyperlipidemia, thoracic aortic aneurysm currently admitted with cardiogenic shock.  According to him at baseline he is very active.  He owns several acres of land that he manages himself.  Over the past several months he has had significant difficulty maintaining sinus rhythm and becomes fairly symptomatic with atrial fibrillation.  He underwent elective cardioversion in August 2024 with improvement in functional status for 2 to 3 weeks before he started having progressive fatigue and exertional dyspnea once again.  Most recently he was seen in cardiology clinic where he was back in atrial flutter however otherwise fairly well compensated at that time.  Unfortunately over the next several days he became rapidly volume overloaded and presented to [facility] emergency department in cardiogenic shock with a lactic acid of 4.7 and LFTs rising quickly to the thousands.  He was started on milrinone 0.125 mcg/kg/min and transferred to [facility].  This morning patient hemodynamically stable; telemetry with heart rate in the 90s; atrial flutter with variable conduction.  Serum creatinine elevated above 4.56.  From a symptomatic standpoint, reports significant improvement in dyspnea and fatigue.   Past Medical History:  Diagnosis Date   Abnormal chest CT    Anaphylactic reaction    Anxiety    Asthma    Benign hypertension    Cardiomyopathy    Cardiomyopathy, idiopathic (HCC)    CHF (congestive heart failure) (HCC)    Chronic renal insufficiency    With creatinine 1.4   Complication of anesthesia    Drug-induced urticaria    FH: thoracic aortic aneurysm    GERD (gastroesophageal  reflux disease)    History of colon polyps    Hives    Long Hx of hives   Hyperlipidemia    Hyperlipidemia    IMPLANTATION OF DEFIBRILLATOR, HX OF 02/15/2009   Qualifier: Diagnosis of  By: Jens Som, MD, Lyn Hollingshead    NSVT (nonsustained ventricular tachycardia) (HCC)    Osteoarthritis    Osteoarthrosis, unspecified whether generalized or localized, lower leg    Osteoporosis    Oxygen deficiency    Psoriasis    PVC (premature ventricular contraction)    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    Current Facility-Administered Medications  Medication Dose Route Frequency Provider Last Rate Last Admin   0.9 %  sodium chloride infusion  250 mL Intravenous PRN Furth, Cadence H, PA-C       acetaminophen (TYLENOL) tablet 650 mg  650 mg Oral Q4H PRN Furth, Cadence H, PA-C       argatroban 1 mg/mL infusion  0.5 mcg/kg/min Intravenous Continuous Juliette Mangle, RPH 2.3 mL/hr at 05/16/23 0800 0.5024 mcg/kg/min at 05/16/23 0800   Chlorhexidine Gluconate Cloth 2 % PADS 6 each  6 each Topical Daily Laurey Morale, MD   6 each at 05/16/23 0959   feeding supplement (ENSURE ENLIVE / ENSURE PLUS) liquid 237 mL  237 mL Oral BID BM Furth, Cadence H, PA-C   237 mL at 05/16/23 0752   furosemide (LASIX) 200 mg in dextrose 5 % 100 mL (2 mg/mL) infusion  6 mg/hr Intravenous Continuous Furth, Cadence H, PA-C 3 mL/hr at 05/16/23 0800 6 mg/hr at 05/16/23 0800   midodrine (PROAMATINE) tablet 5  mg  5 mg Oral TID WC Furth, Cadence H, PA-C   5 mg at 05/16/23 0751   milrinone (PRIMACOR) 20 MG/100 ML (0.2 mg/mL) infusion  0.125 mcg/kg/min Intravenous Continuous Arlester Marker, MD 2.86 mL/hr at 05/16/23 0800 0.125 mcg/kg/min at 05/16/23 0800   ondansetron (ZOFRAN) injection 4 mg  4 mg Intravenous Q6H PRN Furth, Cadence H, PA-C   4 mg at 05/16/23 8295   Oral care mouth rinse  15 mL Mouth Rinse PRN Laurey Morale, MD       pantoprazole (PROTONIX) injection 40 mg  40 mg Intravenous Q12H Furth, Cadence  H, PA-C   40 mg at 05/15/23 2230   sodium chloride flush (NS) 0.9 % injection 3 mL  3 mL Intravenous Q12H Furth, Cadence H, PA-C   3 mL at 05/15/23 2233   sodium chloride flush (NS) 0.9 % injection 3 mL  3 mL Intravenous PRN Furth, Cadence H, PA-C        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   Pork-Derived Products Anaphylaxis   Ace Inhibitors Other (See Comments)    Other Reaction: Other reaction/ Urticaria secondary to COX-1 inhibitors Angioedema   Aspirin Other (See Comments)    Other Reaction: Other reaction   Indocin [Indomethacin] Other (See Comments)   Lactase-Lactobacillus Other (See Comments)   Lisinopril Other (See Comments)   Mobic [Meloxicam] Other (See Comments)   Naproxen Other (See Comments)   Nsaids Other (See Comments)    Other Reaction: Other reaction       Social History   Socioeconomic History   Marital status: Married    Spouse name: Not on file   Number of children: Not on file   Years of education: Not on file   Highest education level: Not on file  Occupational History   Not on file  Tobacco Use   Smoking status: Former    Current packs/day: 0.00    Average packs/day: 1 pack/day for 10.0 years (10.0 ttl pk-yrs)    Types: Cigarettes    Start date: 12/30/1960    Quit date: 12/31/1970    Years since quitting: 52.4   Smokeless tobacco: Never  Vaping Use   Vaping status: Never Used  Substance and Sexual Activity   Alcohol use: Yes    Alcohol/week: 0.0 standard drinks of alcohol    Comment: 2 DRINKS PER DAY, none this week (7/26)   Drug use: Never   Sexual activity: Not Currently  Other Topics Concern   Not on file  Social History Narrative   Not on file   Social Determinants of Health   Financial Resource Strain: Low Risk  (11/05/2022)   Received from [facility]   Overall Financial Resource Strain (CARDIA)    Difficulty of Paying Living  Expenses: Not hard at all  Food Insecurity: No Food Insecurity (05/14/2023)   Hunger Vital Sign    Worried About Running Out of Food in the Last Year: Never true    Ran Out of Food in the Last Year: Never true  Transportation Needs: No Transportation Needs (05/14/2023)   PRAPARE - Administrator, Civil Service (Medical): No    Lack of Transportation (Non-Medical): No  Physical Activity: Unknown (08/12/2018)   Exercise Vital Sign    Days of Exercise per Week: Patient declined    Minutes of Exercise per Session: Patient declined  Stress: No Stress Concern Present (08/12/2018)  [employer] of Occupational Health - Occupational Stress Questionnaire    Feeling of Stress : Not at all  Social Connections: Unknown (08/12/2018)   Social Connection and Isolation Panel [NHANES]    Frequency of Communication with Friends and Family: Patient declined    Frequency of Social Gatherings with Friends and Family: Patient declined    Attends Religious Services: Patient declined    Database administrator or Organizations: Patient declined    Attends Banker Meetings: Patient declined    Marital Status: Patient declined  Intimate Partner Violence: Not At Risk (05/14/2023)   Humiliation, Afraid, Rape, and Kick questionnaire    Fear of Current or Ex-Partner: No    Emotionally Abused: No    Physically Abused: No    Sexually Abused: No      Family History  Problem Relation Age of Onset   Cancer Mother    Lung cancer Mother    Heart attack Father     PHYSICAL EXAM: Vitals:   05/16/23 0745 05/16/23 0800  BP: 104/65 (!) 98/50  Pulse: 92 91  Resp: 20 19  Temp:  97.9 F (36.6 C)  SpO2: 96% 97%   GENERAL: fatigued; anxious. HEENT: Negative for arcus senilis or xanthelasma. There is no scleral icterus.  The mucous membranes are pink and moist.   NECK: Supple, No masses. Normal carotid upstrokes without bruits. No masses or thyromegaly.    CHEST: There are no chest  wall deformities. There is no chest wall tenderness. Respirations are unlabored.  Lungs- crackles at bases b/l CARDIAC:  JVP: 9-10 cm H2O         Normal S1, S2  Normal rate with regular rhythm. No murmurs, rubs or gallops.  Pulses are 2+ and symmetrical in upper and lower extremities. 1+ edema.  ABDOMEN: Soft, non-tender, non-distended. There are no masses or hepatomegaly. There are normal bowel sounds.  EXTREMITIES: Warm and well perfused with no cyanosis, clubbing.  LYMPHATIC: No axillary or supraclavicular lymphadenopathy.  NEUROLOGIC: Patient is oriented x3 with no focal or lateralizing neurologic deficits.  PSYCH: Patients affect is appropriate, there is no evidence of anxiety or depression.  SKIN: Warm and dry; no lesions or wounds.   DATA REVIEW  ECG: 05/16/23: atrial flutter  As per my personal interpretation  ECHO: 11/11/22: LVEF 25-30%, normal RV function.   Telemetry:  Atrial flutter; rates 90s.    ASSESSMENT & PLAN:  SCAI C cardiogenic shock 2/2 nonischemic cardiomyopathy -Heart failure dating back to 2004; diagnosed at [facility] at that time EF of 25% with normal coronary arteries.  Endomyocardial biopsy was also negative.  Felt to be secondary to ventricular ectopy and was subsequently started on amiodarone. -EF of 35 to 40% in 2019-2021, however, since that time it has been 25 to 30% persistently. -Remained fairly well compensated over the past several months until difficulties with atrial tachyarrhythmias leading to current decompensation.  - At [facility], initial lab work concerning for cardiogenic shock with lactic acid elevated to 4.7, LFTs up to 4500/2100 with serum creatinine of 3.01 now 4.56. - Today sCr up to 4.5 likely 2/2 prolonged hypoperfusion; LFTs pending. Reports that he feels  - Bedside TTE w/ LVEF 25%; IVC 2.3cm; PICC line placed. CVP/mixed venous pending.  - lactic acid WNL this AM. - Increase milrinone to 0.33mcg/kg/min. Start levophed .  - IV lasix 80mg   BID; does not appear severely volume overloaded on exam.   2.  Atrial fibrillation/atrial flutter - Echocardiogram in March 2024 with  a EF of 25 to 30%.  Diagnosed with new onset atrial fibrillation in July 2024.  Underwent elective cardioversion in April 07, 2023 with some improvement afterwards. -Presented to cardiology clinic on 05/12/2023; at that time noted to be in atrial flutter and continued on carvedilol/amiodarone appropriately.   - telemetry today with AFL with variable conduction; rates 90s.  - Plan for DCCV this week.   3. AKI on CKD - sCr elevated to 4.56 today likely 2/2 cardiorenal and hypoperfusion.   4. Acute liver injury - AST/ALT on admission 235/138 with rise to 4500/2100 within hours.  - Does not fit with amiodarone induced toxicity.  - Hepatic function panel pending.   5. Alpha-gal  - Currently on argatroban for anticoagulation with undetectable PTT; will transition to bival.    Daimen Shovlin Advanced Heart Failure Mechanical Circulatory Support  CRITICAL CARE Performed by: [name]   Total critical care time: 40 minutes  Critical care time was exclusive of separately billable procedures and treating other patients.  Critical care was necessary to treat or prevent imminent or life-threatening deterioration.  Critical care was time spent personally by me on the following activities: development of treatment plan with patient and/or surrogate as well as nursing, discussions with consultants, evaluation of patient's response to treatment, examination of patient, obtaining history from patient or surrogate, ordering and performing treatments and interventions, ordering and review of laboratory studies, ordering and review of radiographic studies, pulse oximetry and re-evaluation of patient's condition.

## 2023-05-16 NOTE — Progress Notes (Signed)
Peripherally Inserted Central Catheter Placement  The IV Nurse has discussed with the patient and/or persons authorized to consent for the patient, the purpose of this procedure and the potential benefits and risks involved with this procedure.  The benefits include less needle sticks, lab draws from the catheter, and the patient may be discharged home with the catheter. Risks include, but not limited to, infection, bleeding, blood clot (thrombus formation), and puncture of an artery; nerve damage and irregular heartbeat and possibility to perform a PICC exchange if needed/ordered by physician.  Alternatives to this procedure were also discussed.  Bard Power PICC patient education guide, fact sheet on infection prevention and patient information card has been provided to patient /or left at bedside.    PICC Placement Documentation  PICC Double Lumen 05/16/23 Right Basilic 40 cm 0 cm (Active)  Indication for Insertion or Continuance of Line Vasoactive infusions 05/16/23 1143  Exposed Catheter (cm) 0 cm 05/16/23 1143  Site Assessment Clean, Dry, Intact 05/16/23 1143  Lumen #1 Status Saline locked;Blood return noted 05/16/23 1143  Lumen #2 Status Saline locked;Blood return noted 05/16/23 1143  Dressing Type Transparent;Securing device 05/16/23 1143  Dressing Status Antimicrobial disc in place;Clean, Dry, Intact 05/16/23 1143  Line Care Connections checked and tightened 05/16/23 1143  Line Adjustment (NICU/IV Team Only) No 05/16/23 1143  Dressing Intervention Adhesive placed at insertion site (IV team only);New dressing 05/16/23 1143  Dressing Change Due 05/23/23 05/16/23 1143       Burnard Bunting Chenice 05/16/2023, 11:44 AM

## 2023-05-16 NOTE — Progress Notes (Signed)
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 Angioedema   Aspirin Other (See Comments)    Other Reaction: Other reaction   Indocin [Indomethacin] Other (See Comments)   Lactase-Lactobacillus Other (See Comments)   Lisinopril Other (See Comments)   Mobic [Meloxicam] Other (See Comments)   Naproxen Other (See Comments)   Nsaids Other (See Comments)    Other Reaction: Other reaction     Patient Measurements: Height: 5\' 8"  (172.7 cm) Weight: 76.7 kg (169 lb 1.5 oz) IBW/kg (Calculated) : 68.4  Vital Signs: Temp: 97.9 F (36.6 C) (09/28 1130) Temp Source: Oral (09/28 1130) BP: 118/50 (09/28 1400) Pulse Rate: 80 (09/28 1400)  Labs: Recent Labs    05/14/23 0946 05/14/23 1201 05/14/23 1742 05/14/23 1932 05/14/23 2123 05/15/23 0311 05/16/23 0658 05/16/23 1230 05/16/23 1309  HGB 13.0  --   --   --   --  12.9* 11.6*  --   --   HCT 40.4  --   --   --   --  39.7 33.6*  --   --   PLT 267  --   --   --   --  223 160  --   --   APTT  --   --   --   --   --   --  180* 162*  --   CREATININE 2.25*  --  2.32*  --   --  3.01* 4.56*  --  4.54*  TROPONINIHS 44*   < > 71* 53* 82*  --   --   --   --    < > = values in this interval not displayed.    Estimated Creatinine Clearance: 13.6 mL/min (A) (by C-G formula based on SCr of 4.54 mg/dL (H)).   Medical History: Past Medical History:  Diagnosis Date   Abnormal chest CT    Anaphylactic reaction    Anxiety    Asthma    Benign hypertension    Cardiomyopathy    Cardiomyopathy, idiopathic (HCC)    CHF (congestive heart failure) (HCC)    Chronic renal insufficiency    With creatinine 1.4   Complication of anesthesia    Drug-induced  urticaria    FH: thoracic aortic aneurysm    GERD (gastroesophageal reflux disease)    History of colon polyps    Hives    Long Hx of hives   Hyperlipidemia    Hyperlipidemia    IMPLANTATION OF DEFIBRILLATOR, HX OF 02/15/2009   Qualifier: Diagnosis of  By: Jens Som, MD, Lyn Hollingshead    NSVT (nonsustained ventricular tachycardia) (HCC)    Osteoarthritis    Osteoarthrosis, unspecified whether generalized or localized, lower leg    Osteoporosis    Oxygen deficiency    Psoriasis    PVC (premature ventricular contraction)    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 90  tablet 1    carvedilol (COREG) 6.25 MG tablet Hold due to hypotension      cetirizine (ZYRTEC) 10 MG tablet Take 10 mg by mouth daily.      empagliflozin (JARDIANCE) 10 MG TABS tablet Hold.      EPINEPHrine 0.3 mg/0.3 mL IJ SOAJ injection Inject 0.3 mg into the muscle as needed for anaphylaxis.      famotidine (PEPCID) 20 MG tablet Take 20 mg by mouth daily.      feeding supplement (ENSURE ENLIVE / ENSURE PLUS) LIQD Take 237 mLs by mouth 2 (two) times daily between meals.      furosemide 200 mg in dextrose 5 % 80 mL Inject 6 mg/hr into the vein continuous.      hydrALAZINE (APRESOLINE) 25 MG tablet Hold due to hypotension.      isosorbide mononitrate (IMDUR) 30 MG 24 hr tablet Hold due to hypotension.      Menthol-Methyl Salicylate (SALONPAS PAIN RELIEF PATCH) 3-10 % PTCH Apply 1 application  topically daily.      midodrine (PROAMATINE) 5 MG tablet Take 1 tablet (5 mg total) by mouth 3 (three) times daily with meals.      milrinone (PRIMACOR) 20 MG/100 ML SOLN infusion Inject 0.0191 mg/min  into the vein continuous.      Multiple Vitamin (MULTIVITAMIN WITH MINERALS) TABS tablet Take 1 tablet by mouth daily.      spironolactone (ALDACTONE) 25 MG tablet Hold due to hypotension.      Scheduled:   Chlorhexidine Gluconate Cloth  6 each Topical Daily   feeding supplement  237 mL Oral BID BM   midodrine  5 mg Oral TID WC   pantoprazole (PROTONIX) IV  40 mg Intravenous Q12H   sodium chloride flush  10-40 mL Intracatheter Q12H   sodium chloride flush  3 mL Intravenous Q12H   Infusions:   sodium chloride     furosemide (LASIX) 200 mg in dextrose 5 % 100 mL (2 mg/mL) infusion 6 mg/hr (05/15/23 2300)   milrinone 0.25 mcg/kg/min (05/15/23 2300)    Assessment: 77yo male tx'd from [facility] for acute on chronic HF and initiation of milrinone, to transition from Eliquis for Afib (last dose 9/27 am) IV anticoagultion. 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 Argatroban was initially started for him with known AKI, but hepatic function tests came back elevated indicating shock liver. Patient will be treated with bivalirudin at low dose (0.05 mg/kg/hour) as patient has AKI on CKD with Scr of 4.54 mg/dL.   Pt Hgb is down (12.9 to 11.6) and Plt are down (223 to 160). Patient does have bruising at his IV sites that is not due to extravasation.  Goal of Therapy:  aPTT 50-90 seconds Monitor platelets by anticoagulation protocol: Yes   Plan:  Holding bivalirudin for 2 hours. Order aPTT to be drawn in 2 hours to reassess for resumption of bivalirudin to be resumed.   Monitor bleeding/bruising. Monitor 2-hour aPTT Monitor daily aPTT, CBC   Wilmer Floor, PharmD PGY2 Cardiology Pharmacy Resident 05/16/2023,2:16 PM

## 2023-05-17 ENCOUNTER — Other Ambulatory Visit (HOSPITAL_COMMUNITY): Payer: Medicare Other

## 2023-05-17 DIAGNOSIS — I5021 Acute systolic (congestive) heart failure: Secondary | ICD-10-CM | POA: Diagnosis not present

## 2023-05-17 LAB — COOXEMETRY PANEL
Carboxyhemoglobin: 2.5 % — ABNORMAL HIGH (ref 0.5–1.5)
Methemoglobin: 0.9 % (ref 0.0–1.5)
O2 Saturation: 74.3 %
Total hemoglobin: 11.4 g/dL — ABNORMAL LOW (ref 12.0–16.0)

## 2023-05-17 LAB — HEPATIC FUNCTION PANEL
ALT: 2319 U/L — ABNORMAL HIGH (ref 0–44)
AST: 2324 U/L — ABNORMAL HIGH (ref 15–41)
Albumin: 2.4 g/dL — ABNORMAL LOW (ref 3.5–5.0)
Alkaline Phosphatase: 160 U/L — ABNORMAL HIGH (ref 38–126)
Bilirubin, Direct: 1.8 mg/dL — ABNORMAL HIGH (ref 0.0–0.2)
Indirect Bilirubin: 1.7 mg/dL — ABNORMAL HIGH (ref 0.3–0.9)
Total Bilirubin: 3.5 mg/dL — ABNORMAL HIGH (ref 0.3–1.2)
Total Protein: 4.8 g/dL — ABNORMAL LOW (ref 6.5–8.1)

## 2023-05-17 LAB — CBC
HCT: 31.4 % — ABNORMAL LOW (ref 39.0–52.0)
Hemoglobin: 10.6 g/dL — ABNORMAL LOW (ref 13.0–17.0)
MCH: 31.7 pg (ref 26.0–34.0)
MCHC: 33.8 g/dL (ref 30.0–36.0)
MCV: 94 fL (ref 80.0–100.0)
Platelets: 167 10*3/uL (ref 150–400)
RBC: 3.34 MIL/uL — ABNORMAL LOW (ref 4.22–5.81)
RDW: 13.5 % (ref 11.5–15.5)
WBC: 8.1 10*3/uL (ref 4.0–10.5)
nRBC: 0 % (ref 0.0–0.2)

## 2023-05-17 LAB — BASIC METABOLIC PANEL
Anion gap: 14 (ref 5–15)
BUN: 46 mg/dL — ABNORMAL HIGH (ref 8–23)
CO2: 18 mmol/L — ABNORMAL LOW (ref 22–32)
Calcium: 7.3 mg/dL — ABNORMAL LOW (ref 8.9–10.3)
Chloride: 91 mmol/L — ABNORMAL LOW (ref 98–111)
Creatinine, Ser: 4 mg/dL — ABNORMAL HIGH (ref 0.61–1.24)
GFR, Estimated: 15 mL/min — ABNORMAL LOW (ref >60)
Glucose, Bld: 370 mg/dL — ABNORMAL HIGH (ref 70–99)
Potassium: 3.3 mmol/L — ABNORMAL LOW (ref 3.5–5.1)
Sodium: 123 mmol/L — ABNORMAL LOW (ref 135–145)

## 2023-05-17 LAB — PROTIME-INR
INR: >10 (ref 0.8–1.2)
INR: >10 (ref 0.8–1.2)
Prothrombin Time: >90 s — ABNORMAL HIGH (ref 11.4–15.2)
Prothrombin Time: >90 s — ABNORMAL HIGH (ref 11.4–15.2)

## 2023-05-17 LAB — MAGNESIUM: Magnesium: 2.2 mg/dL (ref 1.7–2.4)

## 2023-05-17 LAB — APTT
aPTT: 114 s — ABNORMAL HIGH (ref 24–36)
aPTT: 99 s — ABNORMAL HIGH (ref 24–36)
aPTT: 73 s — ABNORMAL HIGH (ref 24–36)

## 2023-05-17 MED ORDER — AMIODARONE LOAD VIA INFUSION
150.0000 mg | Freq: Once | INTRAVENOUS | Status: AC
  Administered 2023-05-17: 150 mg via INTRAVENOUS

## 2023-05-17 MED ORDER — AMIODARONE IV BOLUS ONLY 150 MG/100ML
150.0000 mg | Freq: Once | INTRAVENOUS | Status: AC
  Administered 2023-05-17: 150 mg via INTRAVENOUS

## 2023-05-17 MED ORDER — AMIODARONE HCL IN DEXTROSE 360-4.14 MG/200ML-% IV SOLN
60.0000 mg/h | INTRAVENOUS | Status: AC
  Administered 2023-05-17 (×3): 60 mg/h via INTRAVENOUS
  Filled 2023-05-17: qty 200

## 2023-05-17 MED ORDER — PHYTONADIONE 5 MG PO TABS
2.5000 mg | ORAL_TABLET | Freq: Once | ORAL | Status: AC
  Administered 2023-05-17: 2.5 mg via ORAL
  Filled 2023-05-17: qty 1

## 2023-05-17 MED ORDER — AMIODARONE HCL IN DEXTROSE 360-4.14 MG/200ML-% IV SOLN
30.0000 mg/h | INTRAVENOUS | Status: DC
  Administered 2023-05-17 – 2023-05-20 (×7): 30 mg/h via INTRAVENOUS
  Filled 2023-05-17 (×8): qty 200

## 2023-05-17 MED ORDER — AMIODARONE HCL IN DEXTROSE 360-4.14 MG/200ML-% IV SOLN
INTRAVENOUS | Status: AC
  Filled 2023-05-17: qty 200

## 2023-05-17 MED ORDER — POTASSIUM CHLORIDE 10 MEQ/100ML IV SOLN
10.0000 meq | Freq: Once | INTRAVENOUS | Status: AC
  Administered 2023-05-17: 10 meq via INTRAVENOUS
  Filled 2023-05-17: qty 100

## 2023-05-17 MED ORDER — SALINE SPRAY 0.65 % NA SOLN
1.0000 | NASAL | Status: DC | PRN
  Filled 2023-05-17: qty 44

## 2023-05-17 NOTE — Significant Event (Signed)
Patient went into atrial fibrillation with rapid ventricular rate.  Due to hypotension, decided to initiate amiodarone drip with bolus.  I will continue to monitor status.

## 2023-05-17 NOTE — Progress Notes (Signed)
ADVANCED HEART FAILURE PROGRESS  NOTE  Referring Physician: [name], MD  Primary Care: [name], MD Primary Cardiologist: Olga Millers, MD  HPI: [name] is a 77 y.o. male with chronic HFrEF secondary to nonischemic cardiomyopathy (diagnosed in 2004), persistent atrial fibrillation, hypertension, hyperlipidemia, thoracic aortic aneurysm currently admitted with cardiogenic shock.  According to him at baseline he is very active.  He owns several acres of land that he manages himself.  Over the past several months he has had significant difficulty maintaining sinus rhythm and becomes fairly symptomatic with atrial fibrillation.  He underwent elective cardioversion in August 2024 with improvement in functional status for 2 to 3 weeks before he started having progressive fatigue and exertional dyspnea once again.  Most recently he was seen in cardiology clinic where he was back in atrial flutter however otherwise fairly well compensated at that time.  Unfortunately over the next several days he became rapidly volume overloaded and presented to [facility] emergency department in cardiogenic shock with a lactic acid of 4.7 and LFTs rising quickly to the thousands.  He was started on milrinone 0.125 mcg/kg/min and transferred to [facility].  Interval hx:  - CVP 3-4 this AM; feels better. Urine output is increasing slowly with 2.2L urine output.  - Mixed venous 74   Past Medical History:  Diagnosis Date   Abnormal chest CT    Anaphylactic reaction    Anxiety    Asthma    Benign hypertension    Cardiomyopathy    Cardiomyopathy, idiopathic (HCC)    CHF (congestive heart failure) (HCC)    Chronic renal insufficiency    With creatinine 1.4   Complication of anesthesia    Drug-induced urticaria    FH: thoracic aortic aneurysm    GERD (gastroesophageal reflux disease)    History of colon polyps    Hives    Long Hx of hives   Hyperlipidemia    Hyperlipidemia    IMPLANTATION  OF DEFIBRILLATOR, HX OF 02/15/2009   Qualifier: Diagnosis of  By: Jens Som, MD, Lyn Hollingshead    NSVT (nonsustained ventricular tachycardia) (HCC)    Osteoarthritis    Osteoarthrosis, unspecified whether generalized or localized, lower leg    Osteoporosis    Oxygen deficiency    Psoriasis    PVC (premature ventricular contraction)    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    Current Facility-Administered Medications  Medication Dose Route Frequency Provider Last Rate Last Admin   0.9 %  sodium chloride infusion  250 mL Intravenous PRN Furth, Cadence H, PA-C   Stopped at 05/16/23 1818   acetaminophen (TYLENOL) tablet 650 mg  650 mg Oral Q4H PRN Furth, Cadence H, PA-C       amiodarone (NEXTERONE PREMIX) 360-4.14 MG/200ML-% (1.8 mg/mL) IV infusion  30 mg/hr Intravenous Continuous Arlester Marker, MD 16.67 mL/hr at 05/17/23 0900 30 mg/hr at 05/17/23 0900   Chlorhexidine Gluconate Cloth 2 % PADS 6 each  6 each Topical Daily Laurey Morale, MD   6 each at 05/16/23 0959   feeding supplement (ENSURE ENLIVE / ENSURE PLUS) liquid 237 mL  237 mL Oral BID BM Furth, Cadence H, PA-C   237 mL at 05/16/23 1433   loperamide (IMODIUM) capsule 2 mg  2 mg Oral PRN Chaka Boyson, DO   2 mg at 05/16/23 1800   midodrine (PROAMATINE) tablet 5 mg  5 mg Oral TID WC Furth, Cadence H, PA-C   5 mg at  05/17/23 0740   milrinone (PRIMACOR) 20 MG/100 ML (0.2 mg/mL) infusion  0.25 mcg/kg/min Intravenous Continuous Oney Tatlock, DO 5.72 mL/hr at 05/17/23 0600 0.25 mcg/kg/min at 05/17/23 0600   norepinephrine (LEVOPHED) 4mg  in (0.016 mg/mL) premix infusion  0-10 mcg/min Intravenous Titrated Marshawn Ninneman, DO 30 mL/hr at 05/17/23 0654 8 mcg/min at 05/17/23 0654   ondansetron (ZOFRAN) injection 4 mg  4 mg Intravenous Q6H PRN Furth, Cadence H, PA-C   4 mg at 05/16/23 1610   Oral care mouth rinse  15 mL Mouth Rinse PRN Laurey Morale, MD       pantoprazole (PROTONIX) injection 40 mg  40 mg  Intravenous Q12H Furth, Cadence H, PA-C   40 mg at 05/16/23 2142   phytonadione (VITAMIN K) tablet 2.5 mg  2.5 mg Oral Once Kanin Lia, DO       sodium chloride flush (NS) 0.9 % injection 10-40 mL  10-40 mL Intracatheter Q12H Laurey Morale, MD   10 mL at 05/16/23 2142   sodium chloride flush (NS) 0.9 % injection 10-40 mL  10-40 mL Intracatheter PRN Laurey Morale, MD       sodium chloride flush (NS) 0.9 % injection 3 mL  3 mL Intravenous Q12H Furth, Cadence H, PA-C   3 mL at 05/16/23 2142   sodium chloride flush (NS) 0.9 % injection 3 mL  3 mL Intravenous PRN Furth, Cadence H, PA-C        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   Pork-Derived Products Anaphylaxis   Ace Inhibitors Other (See Comments)    Other Reaction: Other reaction/ Urticaria secondary to COX-1 inhibitors Angioedema   Aspirin Other (See Comments)    Other Reaction: Other reaction   Indocin [Indomethacin] Other (See Comments)   Lactase-Lactobacillus Other (See Comments)   Lisinopril Other (See Comments)   Mobic [Meloxicam] Other (See Comments)   Naproxen Other (See Comments)   Nsaids Other (See Comments)    Other Reaction: Other reaction       Social History   Socioeconomic History   Marital status: Married    Spouse name: Not on file   Number of children: Not on file   Years of education: Not on file   Highest education level: Not on file  Occupational History   Not on file  Tobacco Use   Smoking status: Former    Current packs/day: 0.00    Average packs/day: 1 pack/day for 10.0 years (10.0 ttl pk-yrs)    Types: Cigarettes    Start date: 12/30/1960    Quit date: 12/31/1970    Years since quitting: 52.4   Smokeless tobacco: Never  Vaping Use   Vaping status: Never Used  Substance and Sexual Activity   Alcohol use: Yes    Alcohol/week: 0.0 standard drinks of alcohol    Comment: 2 DRINKS PER DAY, none this week (7/26)   Drug use: Never    Sexual activity: Not Currently  Other Topics Concern   Not on file  Social History Narrative   Not on file   Social Determinants of Health   Financial Resource Strain: Low Risk  (11/05/2022)   Received from [facility], [facility]   Overall Financial Resource Strain (CARDIA)    Difficulty of Paying Living Expenses: Not hard at all  Food Insecurity: No Food Insecurity (05/16/2023)   Hunger Vital Sign    Worried About Running Out of Food in  the Last Year: Never true    Ran Out of Food in the Last Year: Never true  Transportation Needs: No Transportation Needs (05/16/2023)   PRAPARE - Administrator, Civil Service (Medical): No    Lack of Transportation (Non-Medical): No  Physical Activity: Unknown (08/12/2018)   Exercise Vital Sign    Days of Exercise per Week: Patient declined    Minutes of Exercise per Session: Patient declined  Stress: No Stress Concern Present (08/12/2018)   [employer] of Occupational Health - Occupational Stress Questionnaire    Feeling of Stress : Not at all  Social Connections: Unknown (08/12/2018)   Social Connection and Isolation Panel [NHANES]    Frequency of Communication with Friends and Family: Patient declined    Frequency of Social Gatherings with Friends and Family: Patient declined    Attends Religious Services: Patient declined    Active Member of Clubs or Organizations: Patient declined    Attends Banker Meetings: Patient declined    Marital Status: Patient declined  Intimate Partner Violence: Not At Risk (05/16/2023)   Humiliation, Afraid, Rape, and Kick questionnaire    Fear of Current or Ex-Partner: No    Emotionally Abused: No    Physically Abused: No    Sexually Abused: No      Family History  Problem Relation Age of Onset   Cancer Mother    Lung cancer Mother    Heart attack Father     PHYSICAL EXAM: Vitals:   05/17/23 0545 05/17/23 0600  BP: (!) 108/58 (!)  96/54  Pulse: 87 80  Resp: 17 (!) 21  Temp:    SpO2: 95% 96%   GENERAL: fatigued; anxious. HEENT: Negative for arcus senilis or xanthelasma. There is no scleral icterus.  The mucous membranes are pink and moist.   NECK: Supple, No masses. Normal carotid upstrokes without bruits. No masses or thyromegaly.    CHEST: There are no chest wall deformities. There is no chest wall tenderness. Respirations are unlabored.  Lungs- crackles at bases b/l CARDIAC:  JVP: 5-6         Normal S1, S2  Normal rate with regular rhythm. No murmurs, rubs or gallops.  Pulses are 2+ and symmetrical in upper and lower extremities. 1+ edema.  ABDOMEN: Soft, non-tender, non-distended. There are no masses or hepatomegaly. There are normal bowel sounds.  EXTREMITIES: Warm and well perfused with no cyanosis, clubbing.  LYMPHATIC: No axillary or supraclavicular lymphadenopathy.  NEUROLOGIC: Patient is oriented x3 with no focal or lateralizing neurologic deficits.  PSYCH: Patients affect is appropriate, there is no evidence of anxiety or depression.  SKIN: Warm and dry; no lesions or wounds.   DATA REVIEW  ECG: 05/16/23: atrial flutter  As per my personal interpretation  ECHO: 11/11/22: LVEF 25-30%, normal RV function.   Telemetry:  Atrial fibrillation; rates 90s.    ASSESSMENT & PLAN:  SCAI C cardiogenic shock 2/2 nonischemic cardiomyopathy -Heart failure dating back to 2004; diagnosed at [facility] at that time EF of 25% with normal coronary arteries.  Endomyocardial biopsy was also negative.  Felt to be secondary to ventricular ectopy and was subsequently started on amiodarone. -EF of 35 to 40% in 2019-2021, however, since that time it has been 25 to 30% persistently. -Remained fairly well compensated over the past several months until difficulties with atrial tachyarrhythmias leading to current decompensation.  - At [facility], initial lab work concerning for cardiogenic shock with lactic acid elevated to 4.7, LFTs up to  4500/2100 with serum creatinine up to 4.5. LFTs reaching 6300/2500.  - Improvement in urine output over the past 24h; >1L output over the past 24h. CVP today 3-4. Will hold off on further diuretics. Improvement in sCr to 4.  - Continue milrinone 0.34mcg/kg/min; levophed 5-31mcg.  - Rapid afib overnight; started on amio gtt.  - Bedside TTE w/ LVEF 25%;   2.  Atrial fibrillation/atrial flutter - Echocardiogram in March 2024 with a EF of 25 to 30%.  Diagnosed with new onset atrial fibrillation in July 2024.  Underwent elective cardioversion in April 07, 2023 with some improvement afterwards. -Presented to cardiology clinic on 05/12/2023; at that time noted to be in atrial flutter and continued on carvedilol/amiodarone appropriately.   - telemetry today with AFL with variable conduction; rates 90s.  - Rapid afib overnight; now on amio gtt at 30mg /hr with rate controlled AFL.   3. AKI on CKD - sCr improving; 4 today.    4. Acute liver injury - AST/ALT on admission 235/138 with rise to 4500/2100 within hours. LFTs up to 6500 yesterday. - Does not fit with amiodarone induced toxicity.  - INR > 10 today; vitamin K 5mg  x 1. Hold anticoagulation. Having nose bleeds and bleeding from IV sites.  - Repeat LFTs pending.   5. Alpha-gal  - Currently on argatroban for anticoagulation with undetectable PTT; will transition to bival.    Tila Millirons Advanced Heart Failure Mechanical Circulatory Support  CRITICAL CARE Performed by: [name]   Total critical care time: 40 minutes  Critical care time was exclusive of separately billable procedures and treating other patients.  Critical care was necessary to treat or prevent imminent or life-threatening deterioration.  Critical care was time spent personally by me on the following activities: development of treatment plan with patient and/or surrogate as well as nursing, discussions with consultants, evaluation of patient's response to  treatment, examination of patient, obtaining history from patient or surrogate, ordering and performing treatments and interventions, ordering and review of laboratory studies, ordering and review of radiographic studies, pulse oximetry and re-evaluation of patient's condition.

## 2023-05-18 ENCOUNTER — Inpatient Hospital Stay (HOSPITAL_COMMUNITY): Payer: Medicare Other

## 2023-05-18 ENCOUNTER — Encounter (HOSPITAL_COMMUNITY): Payer: Medicare Other

## 2023-05-18 DIAGNOSIS — I4891 Unspecified atrial fibrillation: Secondary | ICD-10-CM

## 2023-05-18 DIAGNOSIS — M7989 Other specified soft tissue disorders: Secondary | ICD-10-CM

## 2023-05-18 DIAGNOSIS — I498 Other specified cardiac arrhythmias: Secondary | ICD-10-CM | POA: Diagnosis not present

## 2023-05-18 DIAGNOSIS — I4892 Unspecified atrial flutter: Secondary | ICD-10-CM | POA: Diagnosis not present

## 2023-05-18 DIAGNOSIS — I4819 Other persistent atrial fibrillation: Secondary | ICD-10-CM

## 2023-05-18 DIAGNOSIS — R570 Cardiogenic shock: Secondary | ICD-10-CM | POA: Diagnosis not present

## 2023-05-18 DIAGNOSIS — I5021 Acute systolic (congestive) heart failure: Secondary | ICD-10-CM | POA: Diagnosis not present

## 2023-05-18 LAB — CBC
HCT: 32.9 % — ABNORMAL LOW (ref 39.0–52.0)
Hemoglobin: 11.4 g/dL — ABNORMAL LOW (ref 13.0–17.0)
MCH: 31.7 pg (ref 26.0–34.0)
MCHC: 34.7 g/dL (ref 30.0–36.0)
MCV: 91.4 fL (ref 80.0–100.0)
Platelets: 155 10*3/uL (ref 150–400)
RBC: 3.6 MIL/uL — ABNORMAL LOW (ref 4.22–5.81)
RDW: 13.4 % (ref 11.5–15.5)
WBC: 9 10*3/uL (ref 4.0–10.5)
nRBC: 0 % (ref 0.0–0.2)

## 2023-05-18 LAB — HEPATIC FUNCTION PANEL
ALT: 1874 U/L — ABNORMAL HIGH (ref 0–44)
AST: 1413 U/L — ABNORMAL HIGH (ref 15–41)
Albumin: 2.3 g/dL — ABNORMAL LOW (ref 3.5–5.0)
Alkaline Phosphatase: 175 U/L — ABNORMAL HIGH (ref 38–126)
Bilirubin, Direct: 1.9 mg/dL — ABNORMAL HIGH (ref 0.0–0.2)
Indirect Bilirubin: 2.1 mg/dL — ABNORMAL HIGH (ref 0.3–0.9)
Total Bilirubin: 4 mg/dL — ABNORMAL HIGH (ref 0.3–1.2)
Total Protein: 4.6 g/dL — ABNORMAL LOW (ref 6.5–8.1)

## 2023-05-18 LAB — ECHOCARDIOGRAM COMPLETE
AR max vel: 2.06 cm2
AV Area VTI: 1.95 cm2
AV Area mean vel: 2.16 cm2
AV Mean grad: 7.5 mm[Hg]
AV Peak grad: 11.3 mm[Hg]
Ao pk vel: 1.68 m/s
Area-P 1/2: 4.41 cm2
Est EF: <20
Height: 68 in
P 1/2 time: 632 ms
S' Lateral: 5.3 cm
Weight: 2649.05 [oz_av]

## 2023-05-18 LAB — APTT
aPTT: 66 s — ABNORMAL HIGH (ref 24–36)
aPTT: 51 s — ABNORMAL HIGH (ref 24–36)
aPTT: 55 s — ABNORMAL HIGH (ref 24–36)

## 2023-05-18 LAB — COOXEMETRY PANEL
Carboxyhemoglobin: 0.9 % (ref 0.5–1.5)
Carboxyhemoglobin: 1.1 % (ref 0.5–1.5)
Methemoglobin: <0.7 % (ref 0.0–1.5)
Methemoglobin: <0.7 % (ref 0.0–1.5)
O2 Saturation: 67.2 %
O2 Saturation: 67.9 %
Total hemoglobin: 11.9 g/dL — ABNORMAL LOW (ref 12.0–16.0)
Total hemoglobin: 9.1 g/dL — ABNORMAL LOW (ref 12.0–16.0)

## 2023-05-18 LAB — BASIC METABOLIC PANEL
Anion gap: 17 — ABNORMAL HIGH (ref 5–15)
Anion gap: 13 (ref 5–15)
BUN: 43 mg/dL — ABNORMAL HIGH (ref 8–23)
BUN: 42 mg/dL — ABNORMAL HIGH (ref 8–23)
CO2: 22 mmol/L (ref 22–32)
CO2: 22 mmol/L (ref 22–32)
Calcium: 7.7 mg/dL — ABNORMAL LOW (ref 8.9–10.3)
Calcium: 8 mg/dL — ABNORMAL LOW (ref 8.9–10.3)
Chloride: 87 mmol/L — ABNORMAL LOW (ref 98–111)
Chloride: 95 mmol/L — ABNORMAL LOW (ref 98–111)
Creatinine, Ser: 3.08 mg/dL — ABNORMAL HIGH (ref 0.61–1.24)
Creatinine, Ser: 2.78 mg/dL — ABNORMAL HIGH (ref 0.61–1.24)
GFR, Estimated: 20 mL/min — ABNORMAL LOW (ref >60)
GFR, Estimated: 23 mL/min — ABNORMAL LOW (ref >60)
Glucose, Bld: 368 mg/dL — ABNORMAL HIGH (ref 70–99)
Glucose, Bld: 238 mg/dL — ABNORMAL HIGH (ref 70–99)
Potassium: 2.9 mmol/L — ABNORMAL LOW (ref 3.5–5.1)
Potassium: 4 mmol/L (ref 3.5–5.1)
Sodium: 126 mmol/L — ABNORMAL LOW (ref 135–145)
Sodium: 130 mmol/L — ABNORMAL LOW (ref 135–145)

## 2023-05-18 LAB — PROTIME-INR
INR: 6.2 (ref 0.8–1.2)
INR: 3.4 — ABNORMAL HIGH (ref 0.8–1.2)
Prothrombin Time: 55.3 s — ABNORMAL HIGH (ref 11.4–15.2)
Prothrombin Time: 34.9 s — ABNORMAL HIGH (ref 11.4–15.2)

## 2023-05-18 LAB — MAGNESIUM: Magnesium: 1.8 mg/dL (ref 1.7–2.4)

## 2023-05-18 MED ORDER — POTASSIUM CHLORIDE CRYS ER 20 MEQ PO TBCR
40.0000 meq | EXTENDED_RELEASE_TABLET | Freq: Once | ORAL | Status: AC
  Administered 2023-05-18: 40 meq via ORAL
  Filled 2023-05-18: qty 2

## 2023-05-18 MED ORDER — SODIUM CHLORIDE 0.9 % IV SOLN
0.0500 mg/kg/h | INTRAVENOUS | Status: DC
  Administered 2023-05-18: 0.01 mg/kg/h via INTRAVENOUS
  Administered 2023-05-21: 0.03 mg/kg/h via INTRAVENOUS
  Filled 2023-05-18 (×2): qty 250

## 2023-05-18 MED ORDER — INSULIN ASPART 100 UNIT/ML IJ SOLN
0.0000 [IU] | Freq: Every day | INTRAMUSCULAR | Status: DC
  Administered 2023-05-18: 3 [IU] via SUBCUTANEOUS

## 2023-05-18 MED ORDER — PERFLUTREN LIPID MICROSPHERE
1.0000 mL | INTRAVENOUS | Status: AC | PRN
  Administered 2023-05-18: 2 mL via INTRAVENOUS

## 2023-05-18 MED ORDER — POTASSIUM CHLORIDE 10 MEQ/100ML IV SOLN
10.0000 meq | INTRAVENOUS | Status: AC
  Administered 2023-05-18 (×4): 10 meq via INTRAVENOUS
  Filled 2023-05-18 (×4): qty 100

## 2023-05-18 MED ORDER — INSULIN ASPART 100 UNIT/ML IJ SOLN
0.0000 [IU] | Freq: Three times a day (TID) | INTRAMUSCULAR | Status: DC
  Administered 2023-05-20: 1 [IU] via SUBCUTANEOUS

## 2023-05-18 MED ORDER — SODIUM CHLORIDE 0.45 % IV SOLN: INTRAVENOUS | Status: DC

## 2023-05-18 NOTE — Progress Notes (Addendum)
ADVANCED HEART FAILURE PROGRESS  NOTE  Referring Physician: [name], MD  Primary Care: [name], MD Primary Cardiologist: Olga Millers, MD  HPI: [name] is a 77 y.o. male with chronic HFrEF secondary to nonischemic cardiomyopathy (diagnosed in 2004), persistent atrial fibrillation, hypertension, hyperlipidemia, thoracic aortic aneurysm currently admitted with cardiogenic shock.  According to him at baseline he is very active.  He owns several acres of land that he manages himself.  Over the past several months he has had significant difficulty maintaining sinus rhythm and becomes fairly symptomatic with atrial fibrillation.  He underwent elective cardioversion in August 2024 with improvement in functional status for 2 to 3 weeks before he started having progressive fatigue and exertional dyspnea once again.  Most recently he was seen in cardiology clinic where he was back in atrial flutter however otherwise fairly well compensated at that time.  Unfortunately over the next several days he became rapidly volume overloaded and presented to [facility] emergency department in cardiogenic shock with a lactic acid of 4.7 and LFTs rising quickly to the thousands.  He was started on milrinone 0.125 mcg/kg/min and transferred to [facility].  Interval hx:  - LFTs continuing to improve; now 1400/1800. INR 6.2 - Reports having obstructive uropathy last night requiring foley placement.  -CVP 4 -Significant left upper extremity swelling.    Past Medical History:  Diagnosis Date   Abnormal chest CT    Anaphylactic reaction    Anxiety    Asthma    Benign hypertension    Cardiomyopathy    Cardiomyopathy, idiopathic (HCC)    CHF (congestive heart failure) (HCC)    Chronic renal insufficiency    With creatinine 1.4   Complication of anesthesia    Drug-induced urticaria    FH: thoracic aortic aneurysm    GERD (gastroesophageal reflux disease)    History of colon polyps    Hives     Long Hx of hives   Hyperlipidemia    Hyperlipidemia    IMPLANTATION OF DEFIBRILLATOR, HX OF 02/15/2009   Qualifier: Diagnosis of  By: Jens Som, MD, Lyn Hollingshead    NSVT (nonsustained ventricular tachycardia) (HCC)    Osteoarthritis    Osteoarthrosis, unspecified whether generalized or localized, lower leg    Osteoporosis    Oxygen deficiency    Psoriasis    PVC (premature ventricular contraction)    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    Current Facility-Administered Medications  Medication Dose Route Frequency Provider Last Rate Last Admin   0.9 %  sodium chloride infusion  250 mL Intravenous PRN Furth, Cadence H, PA-C   Stopped at 05/16/23 1818   acetaminophen (TYLENOL) tablet 650 mg  650 mg Oral Q4H PRN Furth, Cadence H, PA-C       amiodarone (NEXTERONE PREMIX) 360-4.14 MG/200ML-% (1.8 mg/mL) IV infusion  30 mg/hr Intravenous Continuous Arlester Marker, MD 16.67 mL/hr at 05/18/23 0600 30 mg/hr at 05/18/23 0600   Chlorhexidine Gluconate Cloth 2 % PADS 6 each  6 each Topical Daily Laurey Morale, MD   6 each at 05/17/23 0950   feeding supplement (ENSURE ENLIVE / ENSURE PLUS) liquid 237 mL  237 mL Oral BID BM Furth, Cadence H, PA-C   237 mL at 05/17/23 1429   loperamide (IMODIUM) capsule 2 mg  2 mg Oral PRN Wayburn Shaler, DO   2 mg at 05/16/23 1800   midodrine (PROAMATINE) tablet 5 mg  5 mg Oral TID WC Furth, Cadence  H, PA-C   5 mg at 05/17/23 1738   milrinone (PRIMACOR) 20 MG/100 ML (0.2 mg/mL) infusion  0.25 mcg/kg/min Intravenous Continuous Mohammad Granade, DO 5.72 mL/hr at 05/18/23 0600 0.25 mcg/kg/min at 05/18/23 0600   norepinephrine (LEVOPHED) 4mg  in (0.016 mg/mL) premix infusion  0-10 mcg/min Intravenous Titrated Jaelie Aguilera, DO 7.5 mL/hr at 05/18/23 0600 2 mcg/min at 05/18/23 0600   ondansetron (ZOFRAN) injection 4 mg  4 mg Intravenous Q6H PRN Furth, Cadence H, PA-C   4 mg at 05/16/23 6578   Oral care mouth rinse  15 mL Mouth Rinse PRN  Laurey Morale, MD       pantoprazole (PROTONIX) injection 40 mg  40 mg Intravenous Q12H Furth, Cadence H, PA-C   40 mg at 05/17/23 2156   potassium chloride 10 mEq in 100 mL IVPB  10 mEq Intravenous Q1 Hr x 4 Tannu, Manasi, MD 100 mL/hr at 05/18/23 0644 10 mEq at 05/18/23 0644   sodium chloride (OCEAN) 0.65 % nasal spray 1 spray  1 spray Each Nare PRN Wilsie Kern, DO       sodium chloride flush (NS) 0.9 % injection 10-40 mL  10-40 mL Intracatheter Q12H Laurey Morale, MD   10 mL at 05/17/23 2156   sodium chloride flush (NS) 0.9 % injection 10-40 mL  10-40 mL Intracatheter PRN Laurey Morale, MD       sodium chloride flush (NS) 0.9 % injection 3 mL  3 mL Intravenous Q12H Furth, Cadence H, PA-C   3 mL at 05/17/23 2157   sodium chloride flush (NS) 0.9 % injection 3 mL  3 mL Intravenous PRN Furth, Cadence H, PA-C        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   Pork-Derived Products Anaphylaxis   Ace Inhibitors Other (See Comments)    Other Reaction: Other reaction/ Urticaria secondary to COX-1 inhibitors Angioedema   Aspirin Other (See Comments)    Other Reaction: Other reaction   Indocin [Indomethacin] Other (See Comments)   Lactase-Lactobacillus Other (See Comments)   Lisinopril Other (See Comments)   Mobic [Meloxicam] Other (See Comments)   Naproxen Other (See Comments)   Nsaids Other (See Comments)    Other Reaction: Other reaction       Social History   Socioeconomic History   Marital status: Married    Spouse name: Not on file   Number of children: Not on file   Years of education: Not on file   Highest education level: Not on file  Occupational History   Not on file  Tobacco Use   Smoking status: Former    Current packs/day: 0.00    Average packs/day: 1 pack/day for 10.0 years (10.0 ttl pk-yrs)    Types: Cigarettes    Start date: 12/30/1960    Quit date: 12/31/1970    Years since quitting: 52.4    Smokeless tobacco: Never  Vaping Use   Vaping status: Never Used  Substance and Sexual Activity   Alcohol use: Yes    Alcohol/week: 0.0 standard drinks of alcohol    Comment: 2 DRINKS PER DAY, none this week (7/26)   Drug use: Never   Sexual activity: Not Currently  Other Topics Concern   Not on file  Social History Narrative   Not on file   Social Determinants of Health   Financial Resource Strain: Low Risk  (11/05/2022)   Received from [facility], [facility]   Overall Financial Resource Strain (CARDIA)    Difficulty of Paying Living Expenses: Not hard at all  Food Insecurity: No Food Insecurity (05/16/2023)   Hunger Vital Sign    Worried About Running Out of Food in the Last Year: Never true    Ran Out of Food in the Last Year: Never true  Transportation Needs: No Transportation Needs (05/16/2023)   PRAPARE - Administrator, Civil Service (Medical): No    Lack of Transportation (Non-Medical): No  Physical Activity: Unknown (08/12/2018)   Exercise Vital Sign    Days of Exercise per Week: Patient declined    Minutes of Exercise per Session: Patient declined  Stress: No Stress Concern Present (08/12/2018)   [employer] of Occupational Health - Occupational Stress Questionnaire    Feeling of Stress : Not at all  Social Connections: Unknown (08/12/2018)   Social Connection and Isolation Panel [NHANES]    Frequency of Communication with Friends and Family: Patient declined    Frequency of Social Gatherings with Friends and Family: Patient declined    Attends Religious Services: Patient declined    Active Member of Clubs or Organizations: Patient declined    Attends Banker Meetings: Patient declined    Marital Status: Patient declined  Intimate Partner Violence: Not At Risk (05/16/2023)   Humiliation, Afraid, Rape, and Kick questionnaire    Fear of Current or Ex-Partner: No    Emotionally Abused: No     Physically Abused: No    Sexually Abused: No      Family History  Problem Relation Age of Onset   Cancer Mother    Lung cancer Mother    Heart attack Father     PHYSICAL EXAM: Vitals:   05/18/23 0615 05/18/23 0630  BP: (!) 104/58 91/63  Pulse: 92 89  Resp: (!) 21 19  Temp:    SpO2: 96% 97%   General:  Fatigued appearing HEENT: normal Neck: supple. no JVD. Carotids 2+ bilat; no bruits.  Cor: PMI nondisplaced. Irregular rhythm. No rubs, gallops or murmurs. Lungs: clear Abdomen: soft, nontender, nondistended.  Extremities: no cyanosis, clubbing, rash, no edema, RUE PICC, LUE swelling Neuro: alert & orientedx3. Affect pleasant   DATA REVIEW  ECG: 05/16/23: atrial flutter  As per my personal interpretation  ECHO: 11/11/22: LVEF 25-30%, normal RV function.   Telemetry:  Atrial fibrillation; rates 90s.    ASSESSMENT & PLAN:  SCAI C cardiogenic shock 2/2 nonischemic cardiomyopathy -Heart failure dating back to 2004; diagnosed at [facility] at that time EF of 25% with normal coronary arteries.  Endomyocardial biopsy was also negative.  Felt to be secondary to ventricular ectopy and was subsequently started on amiodarone. -EF of 35 to 40% in 2019-2021, however, since that time it has been 25 to 30% persistently. -Remained fairly well compensated over the past several months until difficulties with atrial tachyarrhythmias leading to current decompensation.  - At [facility], initial lab work concerning for cardiogenic shock with lactic acid elevated to 4.7, LFTs up to 4500/2100 with serum creatinine up to 4.5. LFTs reaching 6300/2500.  - LFTs now significant improved; INR 6.2 today.  - Wean milrinone to 0.125 and check CO-OX at noon. Try to wean off NE today. Down to 2 this am. - Plan for TEE/DCCV today or tomorrow.  - CVP 4. Hold diuretics.    2.  Atrial fibrillation/atrial flutter - Echocardiogram in March 2024 with a EF of 25 to 30%.  Diagnosed with new onset atrial  fibrillation in  July 2024.  Underwent elective cardioversion in April 07, 2023 with some improvement afterwards. -Presented to cardiology clinic on 05/12/2023; at that time noted to be in atrial flutter and continued on carvedilol/amiodarone appropriately.   - telemetry today with AFL with variable conduction; rates 90s.  - continue amio; plan on DCCV tomorrow as above  3. AKI on CKD - sCr improving; 3 today.    4. Acute liver injury - AST/ALT on admission 235/138 with rise to 4500/2100 within hours. LFTs up to 6500  - Does not fit with amiodarone induced toxicity.  - INR > 10, 6.2. Received Vitamin K yesterday - bleeding from IV sites imporved - LFTs improving  5. Alpha-gal  - Currently on argatroban for anticoagulation with undetectable PTT; transitioned to Bival  6. Left upper extremity swelling - Check venous dopplers - ? IV infiltrate. RN reports he had IV amiodarone running on that side   7. Hypokalemia - K 2.9 - Supp aggressively - BMET at noon  Anna Genre, PA-C  Patient seen with PA/NP, agree with the above note.   Subjective: - Feels very tired this morning.    Exam: General: NAD HEENT: Normal.  Neck: Thick neck. No JVD, no thyromegaly or thyroid nodule.  Lungs: Clear to auscultation bilaterally with normal respiratory effort. CV: Nondisplaced PMI.  Heart regular S1/S2, no S3/S4, no murmur.  No peripheral edema.    Abdomen: Soft, nontender, no hepatosplenomegaly, no distention.  Skin: Intact without lesions or rashes.  Neurologic: awake/alert, no gross FND.  Psych: Normal affect. Extremities: No clubbing or cyanosis.   A/P - Weaning milrinone to 0.120mcg/kg/min this morning. Repeat mixed venous this morning.  - Will attempt to wean off levophed today; currently on 2-57mcg.  - CVP 4; holding diuretics.  - Foley placed overnight due to obstructive symptoms; will remove tomorrow. May need urology evaluation.  - INR now 6.2; PTT improving. Will plan on repeat labs today.  Start anticoagulation with bival if improving.  - Consult EP to evaluate for AFL ablation.   Analisa Sledd Advanced Heart Failure  CRITICAL CARE Performed by: [name]   Total critical care time: 40 minutes  Critical care time was exclusive of separately billable procedures and treating other patients.  Critical care was necessary to treat or prevent imminent or life-threatening deterioration.  Critical care was time spent personally by me on the following activities: development of treatment plan with patient and/or surrogate as well as nursing, discussions with consultants, evaluation of patient's response to treatment, examination of patient, obtaining history from patient or surrogate, ordering and performing treatments and interventions, ordering and review of laboratory studies, ordering and review of radiographic studies, pulse oximetry and re-evaluation of patient's condition.

## 2023-05-18 NOTE — Consult Note (Signed)
ELECTROPHYSIOLOGY CONSULT NOTE    Patient ID: [name] MRN: [id number], DOB/AGE: 1945/11/26 77 y.o.  Admit date: 05/15/2023 Date of Consult: 05/18/2023  Primary Physician: [name], MD Primary Cardiologist: Olga Millers, MD  Electrophysiologist: New consult 05/18/23, Dr. Nelly Laurence  Referring Provider: Dr. Mannie Stabile  Patient Profile: [name] is a 77 y.o. 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 Shirlee Latch.  HPI:  [name] is a 77 y.o. male who presented to to [facility] ER on 9/26 with a 3 week hx of weakness & progressive dyspnea.    At baseline, he is reportedly very active, managing several acres himself.  He has HFrEF dating back to 2004.   He was diagnosed with new onset AF in 02/2023 & has had difficulty maintaining SR since.  He underwent elective cardioversion 03/2023 and had improvement in his functional status for 2-3 weeks but ultimately began having increased fatigue and dyspnea again.    He was seen in the cardiology clinic 05/12/23 and was in was AFL but fairly well compensated.  After he developed volume overload, nausea / vomiting and presented to [facility] ER for evaluation. He was found to be in cardiogenic shock with a lactic acid of 4.7 and elevated LFT's suggestive of congestive hepatopathy.  He was started on milrinione and transferred 9/27 to [facility].  Hospital course thus far notable for improved LFT's, INR 6.2, obstructive uropathy requiring foley insertion and LUE swelling. Subsequent RUQ US showed possible 7mm calculi.    He denies chest pain, palpitations, dyspnea, PND, orthopnea, nausea, vomiting, dizziness, syncope, edema, weight gain, or early satiety.   Labs Potassium2.9* (09/30 0326) Magnesium  1.8 (09/30 0326) Creatinine, ser  3.08* (09/30 0326) PLT  155 (09/30  0326) HGB  11.4* (09/30 0326) WBC 9.0 (09/30 0326)  .    Past Medical History:  Diagnosis Date   Abnormal chest CT    Anaphylactic reaction    Anxiety    Asthma    Benign hypertension    Cardiomyopathy    Cardiomyopathy, idiopathic (HCC)    CHF (congestive heart failure) (HCC)    Chronic renal insufficiency    With creatinine 1.4   Complication of anesthesia    Drug-induced urticaria    FH: thoracic aortic aneurysm    GERD (gastroesophageal reflux disease)    History of colon polyps    Hives    Long Hx of hives   Hyperlipidemia    Hyperlipidemia    IMPLANTATION OF DEFIBRILLATOR, HX OF 02/15/2009   Qualifier: Diagnosis of  By: Jens Som, MD, Lyn Hollingshead    NSVT (nonsustained ventricular tachycardia) (HCC)    Osteoarthritis    Osteoarthrosis, unspecified whether generalized or localized, lower leg    Osteoporosis    Oxygen deficiency    Psoriasis    PVC (premature ventricular contraction)    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     Surgical History:  Past Surgical History:  Procedure Laterality Date   APPENDECTOMY     BIV ICD GENERTAOR CHANGE OUT N/A 10/08/2012   Procedure: BIV ICD GENERTAOR CHANGE OUT;  Surgeon: Marinus Maw, MD;  Location: [facility] CATH LAB;  Service: Cardiovascular;  Laterality: N/A;   CARDIAC CATHETERIZATION     CARDIAC DEFIBRILLATOR REMOVAL  2014   CARDIOVERSION N/A 04/07/2023  Procedure: CARDIOVERSION;  Surgeon: Chilton Si, MD;  Location: [facility] INVASIVE CV LAB;  Service: Cardiovascular;  Laterality: N/A;   CATARACT EXTRACTION     CATARACT EXTRACTION W/PHACO Left 03/12/2016   Procedure: CATARACT EXTRACTION PHACO AND INTRAOCULAR LENS PLACEMENT (IOC);  Surgeon: Sallee Lange, MD;  Location: [facility] ORS;  Service: Ophthalmology;  Laterality: Left;  [place] 01:32 AP% 25.7 CDE 41.33 fluid pack lot # [id number]   COLONOSCOPY WITH PROPOFOL N/A 04/26/2019   Procedure: COLONOSCOPY WITH PROPOFOL;  Surgeon: Christena Deem, MD;  Location: [facility]  ENDOSCOPY;  Service: Endoscopy;  Laterality: N/A;   ESOPHAGOGASTRODUODENOSCOPY     EYE SURGERY     Implantation of dual-chamber ICD.     [employer] Jude DDD-ICD 1/07   JOINT REPLACEMENT Right 08/16/2009   KNEE ARTHROPLASTY Left 2006   KNEE ARTHROPLASTY Right 12/10   x2   LAPAROSCOPIC APPENDECTOMY N/A 08/12/2018   Procedure: APPENDECTOMY LAPAROSCOPIC;  Surgeon: Henrene Dodge, MD;  Location: [facility] ORS;  Service: General;  Laterality: N/A;   TONSILLECTOMY       Medications Prior to Admission  Medication Sig Dispense Refill Last Dose   amiodarone (PACERONE) 200 MG tablet TAKE 1 TABLET BY MOUTH EVERY DAY 90 tablet 1    apixaban (ELIQUIS) 5 MG TABS tablet Hold while on heparin gtt      atorvastatin (LIPITOR) 10 MG tablet Take 1 tablet (10 mg total) by mouth daily. 90 tablet 1    carvedilol (COREG) 6.25 MG tablet Hold due to hypotension      cetirizine (ZYRTEC) 10 MG tablet Take 10 mg by mouth daily.      empagliflozin (JARDIANCE) 10 MG TABS tablet Hold.      EPINEPHrine 0.3 mg/0.3 mL IJ SOAJ injection Inject 0.3 mg into the muscle as needed for anaphylaxis.      famotidine (PEPCID) 20 MG tablet Take 20 mg by mouth daily.      feeding supplement (ENSURE ENLIVE / ENSURE PLUS) LIQD Take 237 mLs by mouth 2 (two) times daily between meals.      furosemide 200 mg in dextrose 5 % 80 mL Inject 6 mg/hr into the vein continuous.      hydrALAZINE (APRESOLINE) 25 MG tablet Hold due to hypotension.      isosorbide mononitrate (IMDUR) 30 MG 24 hr tablet Hold due to hypotension.      Menthol-Methyl Salicylate (SALONPAS PAIN RELIEF PATCH) 3-10 % PTCH Apply 1 application  topically daily.      midodrine (PROAMATINE) 5 MG tablet Take 1 tablet (5 mg total) by mouth 3 (three) times daily with meals.      milrinone (PRIMACOR) 20 MG/100 ML SOLN infusion Inject 0.0191 mg/min into the vein continuous.      Multiple Vitamin (MULTIVITAMIN WITH MINERALS) TABS tablet Take 1 tablet by mouth daily.      spironolactone  (ALDACTONE) 25 MG tablet Hold due to hypotension.       Inpatient Medications:   Chlorhexidine Gluconate Cloth  6 each Topical Daily   feeding supplement  237 mL Oral BID BM   midodrine  5 mg Oral TID WC   pantoprazole (PROTONIX) IV  40 mg Intravenous Q12H   sodium chloride flush  10-40 mL Intracatheter Q12H   sodium chloride flush  3 mL Intravenous Q12H    Allergies:  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   Pork-Derived Products Anaphylaxis   Ace Inhibitors Other (See Comments)    Other Reaction: Other reaction/  Urticaria secondary to COX-1 inhibitors Angioedema   Aspirin Other (See Comments)    Other Reaction: Other reaction   Indocin [Indomethacin] Other (See Comments)   Lactase-Lactobacillus Other (See Comments)   Lisinopril Other (See Comments)   Mobic [Meloxicam] Other (See Comments)   Naproxen Other (See Comments)   Nsaids Other (See Comments)    Other Reaction: Other reaction     Family History  Problem Relation Age of Onset   Cancer Mother    Lung cancer Mother    Heart attack Father      Physical Exam: Vitals:   05/18/23 0630 05/18/23 0700 05/18/23 0800 05/18/23 0900  BP: 91/63 117/71 (!) 100/56   Pulse: 89 85 94   Resp: 19 19 19    Temp:    97.8 F (36.6 C)  TempSrc:    Oral  SpO2: 97% 98% 95%   Weight:      Height:        GEN- ill appearing, pleasant adult male in NAD, A&O x 3, normal affect  HEENT: Normocephalic, atraumatic, mild scleral icterus, [place] O2 Lungs- CTAB, Normal effort.  Heart- irregular, 2/6 SEM, No M/G/R.  GI- Soft, NT, ND.  Extremities- No clubbing, cyanosis, or edema   Radiology/Studies:  RUQ [place] 9/27 > equivocal for acute calculus cholecystitis, 7 mm calculus ECHO 9/30 >  LUE [place] 9/30 > negative for DVT   Arrhythmia / AAD Hx: Persistent Atrial Fibrillation / AFL, amiodarone started 02/2023  EKG: 9/26  Atrial Flutter with PVC's (personally reviewed) 9/29 AFL vs AF with  RVR  TELEMETRY:  AFL / coarse AF with controlled ventricular rates 80-92 (personally reviewed)  DEVICE HISTORY:  ICD s/p generator removal 09/2012 w/abandoned leads  Assessment/Plan:  Paroxysmal / Persistent Atrial Fibrillation  Atrial Flutter  CHA2DS2-VASc 4 / 4.8% annual stroke risk. New onset AF dx in 02/2023. DCCV 04/07/23 with NSR, seen in AF Clinic 9/4 and was in SR.  Subsequently developed progressive weakness/dyspnea. Was in rate controlled AFL in Cardiology Clinic 9/24 -continue amiodarone infusion  -currently not ablation candidate given shock state & issues surrounding coagulopathy / anticoagulation  -can see as outpatient in clinic for evaluation for PVI/CTI ablation  -tele monitoring  -pending DCCV   Coagulopathy  Secondary Hypercoagulable State  -continue to follow INR, 6.2  -anticoagulation on hold currently  Cardiogenic Shock  SCAI Class C in setting of NICM. HF dating back to 2004, dx at  [facility] with EF 25%, negative endomyocardial biopsy. Thought secondary to ventricular ectopy and started on amiodarone. ICD removed after some recovery of EF and generator was removed (09/2012), 4 leads abandoned.  -continue milrinone  -midodrine 5mg  TID -continue levophed for MAP >65 -follow up pending ECHO  Ventricular Ectopy  Followed by Dr. Ladona Ridgel, last seen 10/2021, on amiodarone at baseline  -follow   AKI on CKD  Hypokalemia -per primary   ALI  Elevated AST/ALT on admission, suspect in setting of congestive hepatopathy & possible cholecystitis as 7mm stone seen on RUQ [place] -follow INR -s/p Vit K  -trend AST/ALT   Alpha-Gal  Argatroban for anticoagulation with undetectable PTT -now on Bival -would impact decisions regarding anticoagulation surrounding ablation   LUE Swelling  -doppler negative for DVT    For questions or updates, please contact [facility] Please consult [email or web address] for contact info under Cardiology/STEMI.  Signed, Canary Brim, MSN, APRN,  NP-C, AGACNP-BC [facility] - Electrophysiology  05/18/2023, 9:08 AM

## 2023-05-18 NOTE — TOC Initial Note (Signed)
Transition of Care [facility]) - Initial/Assessment Note    Patient Details  Name: [name] MRN: [id number] Date of Birth: 1946-08-16  [facility]) CM/SW Contact:    Elliot Cousin, RN Phone Number: [phone] 05/18/2023, 6:05 PM  Clinical Narrative:   HF TOC CM spoke to pt and states lives at home with wife. Pt was independent pta. Will continue to follow as pt progresses.                Expected Discharge Plan: Home w Home Health Services Barriers to Discharge: Continued Medical Work up   Patient Goals and CMS Choice Patient states their goals for this hospitalization and ongoing recovery are:: wants to get better          Expected Discharge Plan and Services   Discharge Planning Services: CM Consult   Living arrangements for the past 2 months: Single Family Home                                      Prior Living Arrangements/Services Living arrangements for the past 2 months: Single Family Home Lives with:: Spouse Patient language and need for interpreter reviewed:: Yes Do you feel safe going back to the place where you live?: Yes      Need for Family Participation in Patient Care: No (Comment) Care giver support system in place?: Yes (comment) Current home services: DME (scale) Criminal Activity/Legal Involvement Pertinent to Current Situation/Hospitalization: No - Comment as needed  Activities of Daily Living      Permission Sought/Granted Permission sought to share information with : Case Manager, Family Supports, PCP Permission granted to share information with : Yes, Verbal Permission Granted  Share Information with NAME: Green Scherr     Permission granted to share info w Relationship: wife  Permission granted to share info w Contact Information: [phone]  Emotional Assessment Appearance:: Appears stated age Attitude/Demeanor/Rapport: Engaged Affect (typically observed): Accepting Orientation: : Oriented to  Self, Oriented to Place, Oriented to  Time, Oriented to Situation   Psych Involvement: No (comment)  Admission diagnosis:  Acute HFrEF (heart failure with reduced ejection fraction) (HCC) [I50.21] Patient Active Problem List   Diagnosis Date Noted   Acute HFrEF (heart failure with reduced ejection fraction) (HCC) 05/15/2023   CHF exacerbation (HCC) 05/14/2023   Acute kidney injury superimposed on chronic kidney disease (HCC) 05/14/2023   Transaminitis 05/14/2023   Elevated troponin 05/14/2023   Persistent atrial fibrillation (HCC) 04/02/2023   Hypercoagulable state due to persistent atrial fibrillation (HCC) 04/02/2023   History of colonic polyps 09/24/2018   Acute appendicitis 08/12/2018   Thoracic Facet hypertrophy (Bilateral) 01/20/2018   Thoracic facet syndrome (Bilateral) 01/20/2018   DDD (degenerative disc disease), thoracic 01/20/2018   Thoracic radiculitis (Left) 01/20/2018   Chronic upper back pain (Left) 01/20/2018   Aortic atherosclerosis (HCC) 01/20/2018   Lumbar facet arthropathy 01/20/2018   Spondylosis without myelopathy or radiculopathy, thoracic region 01/20/2018   Spondylosis without myelopathy or radiculopathy, lumbosacral region 01/20/2018   History of rib fracture 01/20/2018   Diverticulosis, sigmoid 01/20/2018   Cholelithiasis 01/20/2018   Thoracic ascending aortic aneurysm (HCC) (4.2 cm) 01/20/2018   Hx of total knee replacement (Right) 01/20/2018   Chronic upper quadrant pain (LUQ) (Left) 01/20/2018   Neurogenic pain 01/20/2018   Chronic chest wall pain (Primary Area of Pain) (Left) 01/06/2018   Chronic thoracic back pain (Secondary Area  of Pain) (Left) 01/06/2018   Positive Murphy's Sign 01/05/2018   Chronic pain syndrome 01/05/2018   Pharmacologic therapy 01/05/2018   Disorder of skeletal system 01/05/2018   Encounter for monitoring amiodarone therapy 01/05/2018   B12 deficiency 10/06/2016   Medicare annual wellness visit, initial 10/06/2016    Abdominal pain (Left) 04/20/2015   CKD (chronic kidney disease) stage 3, GFR 30-59 ml/min (HCC) 09/18/2014   Asthma 01/05/2014   Bruit 08/29/2013   Automatic implantable cardioverter-defibrillator in situ 03/25/2012   Acute on chronic HFrEF (heart failure with reduced ejection fraction) (HCC) 09/09/2011   Benign essential hypertension 02/15/2009   Cardiomyopathy, nonischemic (HCC) 02/15/2009   LUNG NODULE 02/15/2009   DIZZINESS 02/15/2009   Nonspecific (abnormal) findings on radiological and other examination of body structure 02/15/2009   Nonspecific abnormal findings on radiological and other examination of skull and head 02/15/2009   Combined hyperlipidemia 10/27/2008   RHEUMATIC FEVER 10/27/2008   Asymptomatic PVCs 10/27/2008   Other osteoporosis 10/27/2008   PCP:  [name], MD Pharmacy:   [facility] [phone] Nicholes Rough, [facility] - [address] [place] [address] Phone: [phone] Fax: [phone]     Social Determinants of Health (SDOH) Social History: SDOH Screenings   Food Insecurity: No Food Insecurity (05/16/2023)  Housing: Low Risk  (05/16/2023)  Transportation Needs: No Transportation Needs (05/16/2023)  Utilities: Not At Risk (05/16/2023)  Financial Resource Strain: Low Risk  (11/05/2022)   Received from [facility], [facility]  Physical Activity: Unknown (08/12/2018)  Social Connections: Unknown (08/12/2018)  Stress: No Stress Concern Present (08/12/2018)  Tobacco Use: Medium Risk (05/13/2023)   Received from [facility]   SDOH Interventions:     Readmission Risk Interventions     No data to display

## 2023-05-18 NOTE — Progress Notes (Signed)
Heart Failure Navigator Progress Note  Assessed for Heart & Vascular TOC clinic readiness.  Patient does not meet criteria due to Advanced Heart Failure Team patient of Dr. Sabharwal.   Navigator will sign off at this time.    Jaking Thayer, BSN, RN Heart Failure Nurse Navigator Secure Chat Only   

## 2023-05-18 NOTE — Progress Notes (Addendum)
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 Angioedema   Aspirin Other (See Comments)    Other Reaction: Other reaction   Indocin [Indomethacin] Other (See Comments)   Lactase-Lactobacillus Other (See Comments)   Lisinopril Other (See Comments)   Mobic [Meloxicam] Other (See Comments)   Naproxen Other (See Comments)   Nsaids Other (See Comments)    Other Reaction: Other reaction     Patient Measurements: Height: 5\' 8"  (172.7 cm) Weight: 75.1 kg (165 lb 9.1 oz) IBW/kg (Calculated) : 68.4  Vital Signs: Temp: 97.7 F (36.5 C) (09/30 0000) Temp Source: Oral (09/30 0000) BP: 100/56 (09/30 0800) Pulse Rate: 94 (09/30 0800)  Labs: Recent Labs    05/16/23 0658 05/16/23 1230 05/16/23 1309 05/16/23 1629 05/17/23 0017 05/17/23 0420 05/17/23 1639 05/18/23 0326  HGB 11.6*  --   --   --  10.6*  --   --  11.4*  HCT 33.6*  --   --   --  31.4*  --   --  32.9*  PLT 160  --   --   --  167  --   --  155  APTT 180*   < >  --    < >  --  99* 73* 66*  LABPROT  --   --   --   --   --  >90.0* >90.0* 55.3*  INR  --   --   --   --   --  >10.0* >10.0* 6.2*  CREATININE 4.56*  --  4.54*  --  4.00*  --   --  3.08*   < > = values in this interval not displayed.    Estimated Creatinine Clearance: 20 mL/min (A) (by C-G formula based on SCr of 3.08 mg/dL (H)).   Medical History: Past Medical History:  Diagnosis Date   Abnormal chest CT    Anaphylactic reaction    Anxiety    Asthma    Benign hypertension    Cardiomyopathy    Cardiomyopathy, idiopathic (HCC)    CHF (congestive heart failure) (HCC)    Chronic renal insufficiency    With creatinine 1.4   Complication of anesthesia    Drug-induced  urticaria    FH: thoracic aortic aneurysm    GERD (gastroesophageal reflux disease)    History of colon polyps    Hives    Long Hx of hives   Hyperlipidemia    Hyperlipidemia    IMPLANTATION OF DEFIBRILLATOR, HX OF 02/15/2009   Qualifier: Diagnosis of  By: Jens Som, MD, Lyn Hollingshead    NSVT (nonsustained ventricular tachycardia) (HCC)    Osteoarthritis    Osteoarthrosis, unspecified whether generalized or localized, lower leg    Osteoporosis    Oxygen deficiency    Psoriasis    PVC (premature ventricular contraction)    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  90 tablet 1    carvedilol (COREG) 6.25 MG tablet Hold due to hypotension      cetirizine (ZYRTEC) 10 MG tablet Take 10 mg by mouth daily.      empagliflozin (JARDIANCE) 10 MG TABS tablet Hold.      EPINEPHrine 0.3 mg/0.3 mL IJ SOAJ injection Inject 0.3 mg into the muscle as needed for anaphylaxis.      famotidine (PEPCID) 20 MG tablet Take 20 mg by mouth daily.      feeding supplement (ENSURE ENLIVE / ENSURE PLUS) LIQD Take 237 mLs by mouth 2 (two) times daily between meals.      furosemide 200 mg in dextrose 5 % 80 mL Inject 6 mg/hr into the vein continuous.      hydrALAZINE (APRESOLINE) 25 MG tablet Hold due to hypotension.      isosorbide mononitrate (IMDUR) 30 MG 24 hr tablet Hold due to hypotension.      Menthol-Methyl Salicylate (SALONPAS PAIN RELIEF PATCH) 3-10 % PTCH Apply 1 application  topically daily.      midodrine (PROAMATINE) 5 MG tablet Take 1 tablet (5 mg total) by mouth 3 (three) times daily with meals.      milrinone (PRIMACOR) 20 MG/100 ML SOLN infusion Inject 0.0191 mg/min  into the vein continuous.      Multiple Vitamin (MULTIVITAMIN WITH MINERALS) TABS tablet Take 1 tablet by mouth daily.      spironolactone (ALDACTONE) 25 MG tablet Hold due to hypotension.      Scheduled:   Chlorhexidine Gluconate Cloth  6 each Topical Daily   feeding supplement  237 mL Oral BID BM   midodrine  5 mg Oral TID WC   pantoprazole (PROTONIX) IV  40 mg Intravenous Q12H   sodium chloride flush  10-40 mL Intracatheter Q12H   sodium chloride flush  3 mL Intravenous Q12H   Infusions:   sodium chloride     furosemide (LASIX) 200 mg in dextrose 5 % 100 mL (2 mg/mL) infusion 6 mg/hr (05/15/23 2300)   milrinone 0.25 mcg/kg/min (05/15/23 2300)    Assessment: 77yo male tx'd from [facility] for acute on chronic HF and initiation of milrinone, to transition from Eliquis for Afib (last dose 9/27 am) IV anticoagultion. 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 Argatroban was initially started for him with known AKI, but hepatic function tests came back elevated indicating shock liver. Patient was initially started on bivalirudin as patient has AKI with baseline CKD.   CBC stable - Hgb 11.4, plt 155. INR>10 yesterday - received vitamin K 5 mg PO yesterday. aPTT this morning came back therapeutic at 66 off anticoagulation. LFT down from 2324/2319 to 1413/1874, total bilirubin up slightly to 4.   Goal of Therapy:  aPTT 50-80 seconds Monitor platelets by anticoagulation protocol: Yes   Plan:  Continue to hold bivalirudin Will recheck aPTT/INR at 1300 to see if restart anticoagulation  Thank you for allowing pharmacy to participate in this patient's care,  Sherron Monday, PharmD, BCCCP Clinical Pharmacist  Phone: [phone] 05/18/2023 8:22 AM  Please check AMION for all [facility] Pharmacy phone numbers After 10:00 PM, call Main Pharmacy [phone]  ADDENDUM aPTT came back elevated off anticoagulation at 51, INR down from 6.2 to 3.4. No s/sx of bleeding. Scr  continues to improve from 3 to 2.78 today. UE duplex finding no evidence of DVT but multiple anechoic areas. Discussed with MD - will start anticoagulation with plans for possible DCCV this week.  Will order bivalirudin 0.01 mg/kg/hr and get level in 4 hours.  Thank you for allowing pharmacy to participate in this patient's care,  Sherron Monday, PharmD, BCCCP Clinical Pharmacist

## 2023-05-18 NOTE — Progress Notes (Signed)
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 Angioedema   Aspirin Other (See Comments)    Other Reaction: Other reaction   Indocin [Indomethacin] Other (See Comments)   Lactase-Lactobacillus Other (See Comments)   Lisinopril Other (See Comments)   Mobic [Meloxicam] Other (See Comments)   Naproxen Other (See Comments)   Nsaids Other (See Comments)    Other Reaction: Other reaction     Patient Measurements: Height: 5\' 8"  (172.7 cm) Weight: 75.1 kg (165 lb 9.1 oz) IBW/kg (Calculated) : 68.4  Vital Signs: Temp: 98.4 F (36.9 C) (09/30 1638) Temp Source: Oral (09/30 1638) BP: 96/52 (09/30 2000) Pulse Rate: 93 (09/30 2000)  Labs: Recent Labs    05/16/23 0658 05/16/23 1230 05/17/23 0017 05/17/23 0420 05/17/23 1639 05/18/23 0326 05/18/23 1203 05/18/23 2017  HGB 11.6*  --  10.6*  --   --  11.4*  --   --   HCT 33.6*  --  31.4*  --   --  32.9*  --   --   PLT 160  --  167  --   --  155  --   --   APTT 180*   < >  --    < > 73* 66* 51* 55*  LABPROT  --   --   --    < > >90.0* 55.3* 34.9*  --   INR  --   --   --    < > >10.0* 6.2* 3.4*  --   CREATININE 4.56*   < > 4.00*  --   --  3.08* 2.78*  --    < > = values in this interval not displayed.    Estimated Creatinine Clearance: 22.2 mL/min (A) (by C-G formula based on SCr of 2.78 mg/dL (H)).   Medical History: Past Medical History:  Diagnosis Date   Abnormal chest CT    Anaphylactic reaction    Anxiety    Asthma    Benign hypertension    Cardiomyopathy    Cardiomyopathy, idiopathic (HCC)    CHF (congestive heart failure) (HCC)    Chronic renal insufficiency    With creatinine 1.4   Complication of anesthesia    Drug-induced  urticaria    FH: thoracic aortic aneurysm    GERD (gastroesophageal reflux disease)    History of colon polyps    Hives    Long Hx of hives   Hyperlipidemia    Hyperlipidemia    IMPLANTATION OF DEFIBRILLATOR, HX OF 02/15/2009   Qualifier: Diagnosis of  By: Jens Som, MD, Lyn Hollingshead    NSVT (nonsustained ventricular tachycardia) (HCC)    Osteoarthritis    Osteoarthrosis, unspecified whether generalized or localized, lower leg    Osteoporosis    Oxygen deficiency    Psoriasis    PVC (premature ventricular contraction)    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 90 tablet 1    carvedilol (COREG) 6.25 MG tablet Hold due to hypotension      cetirizine (ZYRTEC) 10 MG tablet Take 10 mg by mouth daily.      empagliflozin (JARDIANCE) 10 MG TABS tablet Hold.      EPINEPHrine 0.3 mg/0.3 mL IJ SOAJ injection Inject 0.3 mg into the muscle as needed for anaphylaxis.      famotidine (PEPCID) 20 MG tablet Take 20 mg by mouth daily.      feeding supplement (ENSURE ENLIVE / ENSURE PLUS) LIQD Take 237 mLs by mouth 2 (two) times daily between meals.      furosemide 200 mg in dextrose 5 % 80 mL Inject 6 mg/hr into the vein continuous.      hydrALAZINE (APRESOLINE) 25 MG tablet Hold due to hypotension.      isosorbide mononitrate (IMDUR) 30 MG 24 hr tablet Hold due to hypotension.      Menthol-Methyl Salicylate (SALONPAS PAIN RELIEF PATCH) 3-10 % PTCH Apply 1 application  topically daily.      midodrine (PROAMATINE) 5 MG tablet Take 1 tablet (5 mg total) by mouth 3 (three) times daily with meals.      milrinone (PRIMACOR) 20 MG/100 ML SOLN infusion Inject 0.0191 mg/min  into the vein continuous.      Multiple Vitamin (MULTIVITAMIN WITH MINERALS) TABS tablet Take 1 tablet by mouth daily.      spironolactone (ALDACTONE) 25 MG tablet Hold due to hypotension.      Scheduled:   Chlorhexidine Gluconate Cloth  6 each Topical Daily   feeding supplement  237 mL Oral BID BM   insulin aspart  0-5 Units Subcutaneous QHS   insulin aspart  0-6 Units Subcutaneous TID WC   midodrine  5 mg Oral TID WC   pantoprazole (PROTONIX) IV  40 mg Intravenous Q12H   sodium chloride flush  10-40 mL Intracatheter Q12H   sodium chloride flush  3 mL Intravenous Q12H   Infusions:   sodium chloride     furosemide (LASIX) 200 mg in dextrose 5 % 100 mL (2 mg/mL) infusion 6 mg/hr (05/15/23 2300)   milrinone 0.25 mcg/kg/min (05/15/23 2300)    Assessment: 77yo male tx'd from [facility] for acute on chronic HF and initiation of milrinone, to transition from Eliquis for Afib (last dose 9/27 am) IV anticoagultion. 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 Argatroban was initially started for him with known AKI, but hepatic function tests came back elevated indicating shock liver. Patient was initially started on bivalirudin as patient has AKI with baseline CKD.   CBC stable - Hgb 11.4, plt 155. INR>10 yesterday - received vitamin K 5 mg PO yesterday. aPTT this morning came back therapeutic at 66 off anticoagulation. LFT down from 2324/2319 to 1413/1874, total bilirubin up slightly to 4.   AM: aPTT came back elevated off anticoagulation at 51, INR down from 6.2 to 3.4. No s/sx of bleeding. Scr continues to improve from 3 to 2.78 today. UE duplex finding no evidence of DVT but multiple anechoic areas. Discussed with MD - will start anticoagulation with plans for possible DCCV this week. Will order bivalirudin 0.01 mg/kg/hr and get level in 4 hours.  PM: aPTT 55 which is within stated goal. Pink urine reported earlier today, but now amber. No other visible bleeding  reported per d/w RN.  Goal of Therapy:  aPTT 50-80 seconds Monitor platelets by anticoagulation protocol: Yes   Plan:  Continue bivalirudin 0.01 mg/kg/hr Will recheck in 4hrs to confirm therapeutic Daily aPTT, PT/INR, CBC  Thank you for allowing pharmacy to participate in this patient's care,  Loralee Pacas, PharmD, BCPS 05/18/2023 9:14 PM  Please check AMION for all [facility] Pharmacy phone numbers After 10:00 PM, call Main Pharmacy [phone]

## 2023-05-18 NOTE — Progress Notes (Signed)
VASCULAR LAB    Left upper extremity venous duplex has been performed.  See CV proc for preliminary results.   Ameliya Nicotra, RVT 05/18/2023, 9:11 AM

## 2023-05-18 NOTE — Inpatient Diabetes Management (Signed)
Inpatient Diabetes Program Recommendations  AACE/ADA: New Consensus Statement on Inpatient Glycemic Control (2015)  Target Ranges:  Prepandial:   less than 140 mg/dL      Peak postprandial:   less than 180 mg/dL (1-2 hours)      Critically ill patients:  140 - 180 mg/dL   Lab Results  Component Value Date   GLUCAP 138 (H) 05/16/2023    Latest Reference Range & Units 05/17/23 00:17 05/18/23 03:26  Glucose 70 - 99 mg/dL 161 (H) 096 (H)  (H): Data is abnormally high  Diabetes history: No hx DM noted Outpatient Diabetes medications: Farxiga 10 mg qd Current orders for Inpatient glycemic control: None  Inpatient Diabetes Program Recommendations:   Please consider: -Glycemic control order set with 0-6 units tid, 0-5 units hs  Thank you, Darel Hong E. Mckaila Duffus, RN, MSN, CDE  Diabetes Coordinator Inpatient Glycemic Control Team Team Pager [phone] (8am-5pm) 05/18/2023 9:57 AM

## 2023-05-19 ENCOUNTER — Inpatient Hospital Stay (HOSPITAL_COMMUNITY): Payer: Medicare Other

## 2023-05-19 DIAGNOSIS — I4891 Unspecified atrial fibrillation: Secondary | ICD-10-CM

## 2023-05-19 DIAGNOSIS — I5021 Acute systolic (congestive) heart failure: Secondary | ICD-10-CM | POA: Diagnosis not present

## 2023-05-19 DIAGNOSIS — I4892 Unspecified atrial flutter: Secondary | ICD-10-CM | POA: Diagnosis not present

## 2023-05-19 LAB — RAPID URINE DRUG SCREEN, HOSP PERFORMED
Amphetamines: NOT DETECTED
Barbiturates: NOT DETECTED
Benzodiazepines: NOT DETECTED
Cocaine: NOT DETECTED
Opiates: NOT DETECTED
Tetrahydrocannabinol: NOT DETECTED

## 2023-05-19 LAB — BASIC METABOLIC PANEL
Anion gap: 9 (ref 5–15)
BUN: 43 mg/dL — ABNORMAL HIGH (ref 8–23)
CO2: 25 mmol/L (ref 22–32)
Calcium: 7.7 mg/dL — ABNORMAL LOW (ref 8.9–10.3)
Chloride: 97 mmol/L — ABNORMAL LOW (ref 98–111)
Creatinine, Ser: 2.42 mg/dL — ABNORMAL HIGH (ref 0.61–1.24)
GFR, Estimated: 27 mL/min — ABNORMAL LOW (ref >60)
Glucose, Bld: 177 mg/dL — ABNORMAL HIGH (ref 70–99)
Potassium: 3.8 mmol/L (ref 3.5–5.1)
Sodium: 131 mmol/L — ABNORMAL LOW (ref 135–145)

## 2023-05-19 LAB — CBC
HCT: 31.4 % — ABNORMAL LOW (ref 39.0–52.0)
Hemoglobin: 10.5 g/dL — ABNORMAL LOW (ref 13.0–17.0)
MCH: 31.1 pg (ref 26.0–34.0)
MCHC: 33.4 g/dL (ref 30.0–36.0)
MCV: 92.9 fL (ref 80.0–100.0)
Platelets: 128 10*3/uL — ABNORMAL LOW (ref 150–400)
RBC: 3.38 MIL/uL — ABNORMAL LOW (ref 4.22–5.81)
RDW: 13.9 % (ref 11.5–15.5)
WBC: 9.2 10*3/uL (ref 4.0–10.5)
nRBC: 0 % (ref 0.0–0.2)

## 2023-05-19 LAB — GLUCOSE, CAPILLARY
Glucose-Capillary: 160 mg/dL — ABNORMAL HIGH (ref 70–99)
Glucose-Capillary: 211 mg/dL — ABNORMAL HIGH (ref 70–99)
Glucose-Capillary: 87 mg/dL (ref 70–99)
Glucose-Capillary: 93 mg/dL (ref 70–99)
Glucose-Capillary: 148 mg/dL — ABNORMAL HIGH (ref 70–99)
Glucose-Capillary: 110 mg/dL — ABNORMAL HIGH (ref 70–99)

## 2023-05-19 LAB — POCT I-STAT 7, (LYTES, BLD GAS, ICA,H+H)
Acid-Base Excess: 0 mmol/L (ref 0.0–2.0)
Bicarbonate: 22.8 mmol/L (ref 20.0–28.0)
Calcium, Ion: 1.1 mmol/L — ABNORMAL LOW (ref 1.15–1.40)
HCT: 32 % — ABNORMAL LOW (ref 39.0–52.0)
Hemoglobin: 10.9 g/dL — ABNORMAL LOW (ref 13.0–17.0)
O2 Saturation: 98 %
Patient temperature: 98.6
Potassium: 4.3 mmol/L (ref 3.5–5.1)
Sodium: 131 mmol/L — ABNORMAL LOW (ref 135–145)
TCO2: 24 mmol/L (ref 22–32)
pCO2 arterial: 31.3 mm[Hg] — ABNORMAL LOW (ref 32–48)
pH, Arterial: 7.471 — ABNORMAL HIGH (ref 7.35–7.45)
pO2, Arterial: 92 mm[Hg] (ref 83–108)

## 2023-05-19 LAB — HEPATIC FUNCTION PANEL
ALT: 1333 U/L — ABNORMAL HIGH (ref 0–44)
AST: 664 U/L — ABNORMAL HIGH (ref 15–41)
Albumin: 2.3 g/dL — ABNORMAL LOW (ref 3.5–5.0)
Alkaline Phosphatase: 162 U/L — ABNORMAL HIGH (ref 38–126)
Bilirubin, Direct: 2 mg/dL — ABNORMAL HIGH (ref 0.0–0.2)
Indirect Bilirubin: 2.1 mg/dL — ABNORMAL HIGH (ref 0.3–0.9)
Total Bilirubin: 4.1 mg/dL — ABNORMAL HIGH (ref 0.3–1.2)
Total Protein: 4.6 g/dL — ABNORMAL LOW (ref 6.5–8.1)

## 2023-05-19 LAB — COOXEMETRY PANEL
Carboxyhemoglobin: 1.2 % (ref 0.5–1.5)
Carboxyhemoglobin: 1.9 % — ABNORMAL HIGH (ref 0.5–1.5)
Methemoglobin: <0.7 % (ref 0.0–1.5)
Methemoglobin: <0.7 % (ref 0.0–1.5)
O2 Saturation: 73.5 %
O2 Saturation: 69.6 %
Total hemoglobin: 11.3 g/dL — ABNORMAL LOW (ref 12.0–16.0)
Total hemoglobin: 11.4 g/dL — ABNORMAL LOW (ref 12.0–16.0)

## 2023-05-19 LAB — APTT
aPTT: 46 s — ABNORMAL HIGH (ref 24–36)
aPTT: 51 s — ABNORMAL HIGH (ref 24–36)
aPTT: 55 s — ABNORMAL HIGH (ref 24–36)
aPTT: >200 s (ref 24–36)

## 2023-05-19 LAB — MAGNESIUM: Magnesium: 1.7 mg/dL (ref 1.7–2.4)

## 2023-05-19 LAB — HEMOGLOBIN A1C
Hgb A1c MFr Bld: 6 % — ABNORMAL HIGH (ref 4.8–5.6)
Mean Plasma Glucose: 125.5 mg/dL

## 2023-05-19 LAB — PROTIME-INR
INR: 2.1 — ABNORMAL HIGH (ref 0.8–1.2)
Prothrombin Time: 23.9 s — ABNORMAL HIGH (ref 11.4–15.2)

## 2023-05-19 MED ORDER — CIPROFLOXACIN HCL 0.3 % OP SOLN
2.0000 [drp] | OPHTHALMIC | Status: AC
  Administered 2023-05-19 – 2023-05-24 (×25): 2 [drp] via OPHTHALMIC
  Filled 2023-05-19: qty 2.5

## 2023-05-19 MED ORDER — PROPOFOL 10 MG/ML IV BOLUS
200.0000 mg | Freq: Once | INTRAVENOUS | Status: AC
  Administered 2023-05-19: 100 mg via INTRAVENOUS
  Filled 2023-05-19: qty 20

## 2023-05-19 MED ORDER — CIPROFLOXACIN HCL 0.3 % OP SOLN
1.0000 [drp] | OPHTHALMIC | Status: DC
  Filled 2023-05-19: qty 2.5

## 2023-05-19 MED ORDER — POTASSIUM CHLORIDE CRYS ER 20 MEQ PO TBCR
40.0000 meq | EXTENDED_RELEASE_TABLET | Freq: Once | ORAL | Status: AC
  Administered 2023-05-19: 40 meq via ORAL
  Filled 2023-05-19: qty 2

## 2023-05-19 MED ORDER — MIDAZOLAM HCL 2 MG/2ML IJ SOLN
INTRAMUSCULAR | Status: AC
  Filled 2023-05-19: qty 2

## 2023-05-19 MED ORDER — MAGNESIUM SULFATE 2 GM/50ML IV SOLN
2.0000 g | Freq: Once | INTRAVENOUS | Status: AC
  Administered 2023-05-19: 2 g via INTRAVENOUS
  Filled 2023-05-19: qty 50

## 2023-05-19 MED ORDER — NOREPINEPHRINE 4 MG/250ML-% IV SOLN
0.0000 ug/min | INTRAVENOUS | Status: DC
  Administered 2023-05-21: 2 ug/min via INTRAVENOUS
  Filled 2023-05-19: qty 250

## 2023-05-19 MED ORDER — NAPHAZOLINE-GLYCERIN 0.012-0.25 % OP SOLN: 1.0000 [drp] | Freq: Four times a day (QID) | OPHTHALMIC | Status: DC | PRN

## 2023-05-19 MED ORDER — FENTANYL CITRATE PF 50 MCG/ML IJ SOSY
100.0000 ug | PREFILLED_SYRINGE | Freq: Once | INTRAMUSCULAR | Status: AC
  Administered 2023-05-19: 100 ug via INTRAVENOUS
  Filled 2023-05-19: qty 2

## 2023-05-19 MED ORDER — MIDODRINE HCL 5 MG PO TABS
2.5000 mg | ORAL_TABLET | Freq: Three times a day (TID) | ORAL | Status: DC
  Administered 2023-05-19: 2.5 mg via ORAL
  Filled 2023-05-19: qty 1

## 2023-05-19 NOTE — Progress Notes (Addendum)
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 Angioedema   Aspirin Other (See Comments)    Other Reaction: Other reaction   Indocin [Indomethacin] Other (See Comments)   Lactase-Lactobacillus Other (See Comments)   Lisinopril Other (See Comments)   Mobic [Meloxicam] Other (See Comments)   Naproxen Other (See Comments)   Nsaids Other (See Comments)    Other Reaction: Other reaction     Patient Measurements: Height: 5\' 8"  (172.7 cm) Weight: 75.1 kg (165 lb 9.1 oz) IBW/kg (Calculated) : 68.4  Vital Signs: Temp: 98.2 F (36.8 C) (10/01 0000) Temp Source: Oral (10/01 0000) BP: 121/63 (10/01 0500) Pulse Rate: 83 (10/01 0500)  Labs: Recent Labs    05/17/23 0017 05/17/23 0420 05/18/23 0326 05/18/23 1203 05/18/23 2017 05/19/23 0011 05/19/23 0429 05/19/23 0430  HGB 10.6*  --  11.4*  --   --   --   --  10.5*  HCT 31.4*  --  32.9*  --   --   --   --  31.4*  PLT 167  --  155  --   --   --   --  128*  APTT  --    < > 66* 51* 55* 55* 51*  --   LABPROT  --    < > 55.3* 34.9*  --   --   --  23.9*  INR  --    < > 6.2* 3.4*  --   --   --  2.1*  CREATININE 4.00*  --  3.08* 2.78*  --   --   --  2.42*   < > = values in this interval not displayed.    Estimated Creatinine Clearance: 25.5 mL/min (A) (by C-G formula based on SCr of 2.42 mg/dL (H)).   Medical History: Past Medical History:  Diagnosis Date   Abnormal chest CT    Anaphylactic reaction    Anxiety    Asthma    Benign hypertension    Cardiomyopathy    Cardiomyopathy, idiopathic (HCC)    CHF (congestive heart failure) (HCC)    Chronic renal insufficiency    With creatinine 1.4   Complication of anesthesia    Drug-induced  urticaria    FH: thoracic aortic aneurysm    GERD (gastroesophageal reflux disease)    History of colon polyps    Hives    Long Hx of hives   Hyperlipidemia    Hyperlipidemia    IMPLANTATION OF DEFIBRILLATOR, HX OF 02/15/2009   Qualifier: Diagnosis of  By: Jens Som, MD, Lyn Hollingshead    NSVT (nonsustained ventricular tachycardia) (HCC)    Osteoarthritis    Osteoarthrosis, unspecified whether generalized or localized, lower leg    Osteoporosis    Oxygen deficiency    Psoriasis    PVC (premature ventricular contraction)    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 90  tablet 1    carvedilol (COREG) 6.25 MG tablet Hold due to hypotension      cetirizine (ZYRTEC) 10 MG tablet Take 10 mg by mouth daily.      empagliflozin (JARDIANCE) 10 MG TABS tablet Hold.      EPINEPHrine 0.3 mg/0.3 mL IJ SOAJ injection Inject 0.3 mg into the muscle as needed for anaphylaxis.      famotidine (PEPCID) 20 MG tablet Take 20 mg by mouth daily.      feeding supplement (ENSURE ENLIVE / ENSURE PLUS) LIQD Take 237 mLs by mouth 2 (two) times daily between meals.      furosemide 200 mg in dextrose 5 % 80 mL Inject 6 mg/hr into the vein continuous.      hydrALAZINE (APRESOLINE) 25 MG tablet Hold due to hypotension.      isosorbide mononitrate (IMDUR) 30 MG 24 hr tablet Hold due to hypotension.      Menthol-Methyl Salicylate (SALONPAS PAIN RELIEF PATCH) 3-10 % PTCH Apply 1 application  topically daily.      midodrine (PROAMATINE) 5 MG tablet Take 1 tablet (5 mg total) by mouth 3 (three) times daily with meals.      milrinone (PRIMACOR) 20 MG/100 ML SOLN infusion Inject 0.0191 mg/min  into the vein continuous.      Multiple Vitamin (MULTIVITAMIN WITH MINERALS) TABS tablet Take 1 tablet by mouth daily.      spironolactone (ALDACTONE) 25 MG tablet Hold due to hypotension.      Scheduled:   Chlorhexidine Gluconate Cloth  6 each Topical Daily   feeding supplement  237 mL Oral BID BM   insulin aspart  0-5 Units Subcutaneous QHS   insulin aspart  0-6 Units Subcutaneous TID WC   midodrine  5 mg Oral TID WC   pantoprazole (PROTONIX) IV  40 mg Intravenous Q12H   sodium chloride flush  10-40 mL Intracatheter Q12H   sodium chloride flush  3 mL Intravenous Q12H   Infusions:   sodium chloride     furosemide (LASIX) 200 mg in dextrose 5 % 100 mL (2 mg/mL) infusion 6 mg/hr (05/15/23 2300)   milrinone 0.25 mcg/kg/min (05/15/23 2300)    Assessment: 77yo male tx'd from [facility] for acute on chronic HF and initiation of milrinone, to transition from Eliquis for Afib (last dose 9/27 am) IV anticoagultion. 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 Argatroban was initially started for him with known AKI, but hepatic function tests came back elevated indicating shock liver. Patient was initially started on bivalirudin as patient has AKI with baseline CKD. INR >10 on 9/29 - receive vitamin K mg PO. Off anticoagulation on 9/29, aPTT returned at 66 > 51. UE duplex finding no evidence of DVT but multiple anechoic areas. Discussed with MD - will start anticoagulation with plans for possible DCCV this week.  10/1 0800: aPTT 51, therapeutic on 0.01 mg/kg/hr. Pink urine previously reported in urine, no blood in urine overnight per RN. Mild nose bleed overnight, but no other visible bleeding RN. CBC shows Hgb stable low at 10.5 and Plt down to 128. Scr stable at 2.42.   Goal of Therapy:  aPTT 50-80 seconds Monitor platelets by anticoagulation protocol: Yes   Plan:  Continue bivalirudin 0.01 mg/kg/hr 12h aPTT Daily aPTT, PT/INR, CBC  Thank you for allowing  pharmacy to participate in this patient's care,  Enos Fling, PharmD PGY-1 Acute Care Pharmacy Resident 05/19/2023 6:43 AM

## 2023-05-19 NOTE — Progress Notes (Signed)
ABG obtained while patient wearing 2L [place].

## 2023-05-19 NOTE — Procedures (Signed)
05/19/2023 100mg  propofol and fent given for TEE/cardioversion with EtCO2 monitoring. No immediate complications.  15 min moderate sedation  Myrla Halsted MD PCCM

## 2023-05-19 NOTE — Progress Notes (Addendum)
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 Angioedema   Aspirin Other (See Comments)    Other Reaction: Other reaction   Indocin [Indomethacin] Other (See Comments)   Lactase-Lactobacillus Other (See Comments)   Lisinopril Other (See Comments)   Mobic [Meloxicam] Other (See Comments)   Naproxen Other (See Comments)   Nsaids Other (See Comments)    Other Reaction: Other reaction     Patient Measurements: Height: 5\' 8"  (172.7 cm) Weight: 75.1 kg (165 lb 9.1 oz) IBW/kg (Calculated) : 68.4  Vital Signs: Temp: 98.2 F (36.8 C) (10/01 0000) Temp Source: Oral (10/01 0000) BP: 115/59 (10/01 0000) Pulse Rate: 94 (10/01 0000)  Labs: Recent Labs    05/16/23 0658 05/16/23 1230 05/17/23 0017 05/17/23 0420 05/17/23 1639 05/18/23 0326 05/18/23 1203 05/18/23 2017 05/19/23 0011  HGB 11.6*  --  10.6*  --   --  11.4*  --   --   --   HCT 33.6*  --  31.4*  --   --  32.9*  --   --   --   PLT 160  --  167  --   --  155  --   --   --   APTT 180*   < >  --    < > 73* 66* 51* 55* 55*  LABPROT  --   --   --    < > >90.0* 55.3* 34.9*  --   --   INR  --   --   --    < > >10.0* 6.2* 3.4*  --   --   CREATININE 4.56*   < > 4.00*  --   --  3.08* 2.78*  --   --    < > = values in this interval not displayed.    Estimated Creatinine Clearance: 22.2 mL/min (A) (by C-G formula based on SCr of 2.78 mg/dL (H)).   Medical History: Past Medical History:  Diagnosis Date   Abnormal chest CT    Anaphylactic reaction    Anxiety    Asthma    Benign hypertension    Cardiomyopathy    Cardiomyopathy, idiopathic (HCC)    CHF (congestive heart failure) (HCC)    Chronic renal insufficiency    With creatinine  1.4   Complication of anesthesia    Drug-induced urticaria    FH: thoracic aortic aneurysm    GERD (gastroesophageal reflux disease)    History of colon polyps    Hives    Long Hx of hives   Hyperlipidemia    Hyperlipidemia    IMPLANTATION OF DEFIBRILLATOR, HX OF 02/15/2009   Qualifier: Diagnosis of  By: Jens Som, MD, Lyn Hollingshead    NSVT (nonsustained ventricular tachycardia) (HCC)    Osteoarthritis    Osteoarthrosis, unspecified whether generalized or localized, lower leg    Osteoporosis    Oxygen deficiency    Psoriasis    PVC (premature ventricular contraction)    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 90 tablet 1    carvedilol (COREG) 6.25 MG tablet Hold due to hypotension      cetirizine (ZYRTEC) 10 MG tablet Take 10 mg by mouth daily.      empagliflozin (JARDIANCE) 10 MG TABS tablet Hold.      EPINEPHrine 0.3 mg/0.3 mL IJ SOAJ injection Inject 0.3 mg into the muscle as needed for anaphylaxis.      famotidine (PEPCID) 20 MG tablet Take 20 mg by mouth daily.      feeding supplement (ENSURE ENLIVE / ENSURE PLUS) LIQD Take 237 mLs by mouth 2 (two) times daily between meals.      furosemide 200 mg in dextrose 5 % 80 mL Inject 6 mg/hr into the vein continuous.      hydrALAZINE (APRESOLINE) 25 MG tablet Hold due to hypotension.      isosorbide mononitrate (IMDUR) 30 MG 24 hr tablet Hold due to hypotension.      Menthol-Methyl Salicylate (SALONPAS PAIN RELIEF PATCH) 3-10 % PTCH Apply 1 application  topically daily.      midodrine (PROAMATINE) 5 MG tablet Take 1 tablet (5 mg total) by mouth 3 (three) times daily with meals.      milrinone (PRIMACOR)  20 MG/100 ML SOLN infusion Inject 0.0191 mg/min into the vein continuous.      Multiple Vitamin (MULTIVITAMIN WITH MINERALS) TABS tablet Take 1 tablet by mouth daily.      spironolactone (ALDACTONE) 25 MG tablet Hold due to hypotension.      Scheduled:   Chlorhexidine Gluconate Cloth  6 each Topical Daily   feeding supplement  237 mL Oral BID BM   insulin aspart  0-5 Units Subcutaneous QHS   insulin aspart  0-6 Units Subcutaneous TID WC   midodrine  5 mg Oral TID WC   pantoprazole (PROTONIX) IV  40 mg Intravenous Q12H   sodium chloride flush  10-40 mL Intracatheter Q12H   sodium chloride flush  3 mL Intravenous Q12H   Infusions:   sodium chloride     furosemide (LASIX) 200 mg in dextrose 5 % 100 mL (2 mg/mL) infusion 6 mg/hr (05/15/23 2300)   milrinone 0.25 mcg/kg/min (05/15/23 2300)    Assessment: 77yo male tx'd from [facility] for acute on chronic HF and initiation of milrinone, to transition from Eliquis for Afib (last dose 9/27 am) IV anticoagultion. 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 Argatroban was initially started for him with known AKI, but hepatic function tests came back elevated indicating shock liver. Patient was initially started on bivalirudin as patient has AKI with baseline CKD.   CBC stable - Hgb 11.4, plt 155. INR>10 yesterday - received vitamin K 5 mg PO yesterday. aPTT this morning came back therapeutic at 66 off anticoagulation. LFT down from 2324/2319 to 1413/1874, total bilirubin up slightly to 4.   AM: aPTT came back elevated off anticoagulation at 51, INR down from 6.2 to 3.4. No s/sx of bleeding. Scr continues to improve from 3 to 2.78 today. UE duplex finding no evidence of DVT but multiple anechoic areas. Discussed with MD - will start anticoagulation with plans for possible DCCV this week. Will order bivalirudin 0.01 mg/kg/hr and get level in 4 hours.  10/1 AM: aPTT 55 which is within goal. Pink urine reported earlier  today, but now amber. No other visible bleeding reported  per d/w RN. Patient had reported mild nose bleeds that has stopped. Last CBC shows Hgb low, trending up and plts WNL  Goal of Therapy:  aPTT 50-80 seconds Monitor platelets by anticoagulation protocol: Yes   Plan:  Continue bivalirudin 0.01 mg/kg/hr 12h aPTT Daily aPTT, PT/INR, CBC  Thank you for allowing pharmacy to participate in this patient's care,  Arabella Merles, PharmD. Clinical Pharmacist 05/19/2023 12:49 AM

## 2023-05-19 NOTE — Progress Notes (Addendum)
Advanced Heart Failure Rounding Note  PCP-Cardiologist: Olga Millers, MD   Subjective:    Milrinone dec. CO-OX 74% today on milrinone 0.125. NE off and on overnight (up to 2 mcg/min), currently off. CVP 5.  LFTs continue to trend down and Scr further improved to 2.4.   Right eye is itchy and painful.   Objective:   Weight Range: 73.3 kg Body mass index is 24.57 kg/m.   Vital Signs:   Temp:  [97.8 F (36.6 C)-98.4 F (36.9 C)] 98.3 F (36.8 C) (10/01 0400) Pulse Rate:  [83-102] 91 (10/01 0700) Resp:  [18-27] 22 (10/01 0700) BP: (91-123)/(52-81) 121/81 (10/01 0700) SpO2:  [92 %-100 %] 93 % (10/01 0700) Weight:  [73.3 kg] 73.3 kg (10/01 0700) Last BM Date : 05/16/23  Weight change: Filed Weights   05/17/23 0500 05/18/23 0500 05/19/23 0700  Weight: 76.8 kg 75.1 kg 73.3 kg    Intake/Output:   Intake/Output Summary (Last 24 hours) at 05/19/2023 0804 Last data filed at 05/19/2023 0700 Gross per 24 hour  Intake 961.55 ml  Output 1070 ml  Net -108.45 ml      Physical Exam    General:  Fatigued appearing HEENT: + erythema and swelling surrounding right eye Neck: Supple. No JVD . Carotids 2+ bilat; no bruits.  Cor: PMI nondisplaced. Irregular rhythm. No rubs, gallops or murmurs. Lungs: Clear Abdomen: Soft, nontender, nondistended.  Extremities: No cyanosis, clubbing, rash, + RUE PICC Neuro: Alert & orientedx3. Affect pleasant   Telemetry   Aflutter 80s-90s  Labs    CBC Recent Labs    05/18/23 0326 05/19/23 0430  WBC 9.0 9.2  HGB 11.4* 10.5*  HCT 32.9* 31.4*  MCV 91.4 92.9  PLT 155 128*   Basic Metabolic Panel Recent Labs    81/19/14 0326 05/18/23 1203 05/19/23 0430  NA 126* 130* 131*  K 2.9* 4.0 3.8  CL 87* 95* 97*  CO2 22 22 25   GLUCOSE 368* 238* 177*  BUN 43* 42* 43*  CREATININE 3.08* 2.78* 2.42*  CALCIUM 7.7* 8.0* 7.7*  MG 1.8  --  1.7   Liver Function Tests Recent Labs    05/18/23 0326 05/19/23 0430  AST 1,413* 664*   ALT 1,874* 1,333*  ALKPHOS 175* 162*  BILITOT 4.0* 4.1*  PROT 4.6* 4.6*  ALBUMIN 2.3* 2.3*   No results for input(s): "LIPASE", "AMYLASE" in the last 72 hours. Cardiac Enzymes No results for input(s): "CKTOTAL", "CKMB", "CKMBINDEX", "TROPONINI" in the last 72 hours.  BNP: BNP (last 3 results) Recent Labs    05/14/23 0946  BNP 4,176.1*    ProBNP (last 3 results) No results for input(s): "PROBNP" in the last 8760 hours.   D-Dimer No results for input(s): "DDIMER" in the last 72 hours. Hemoglobin A1C Recent Labs    05/19/23 0430  HGBA1C 6.0*   Fasting Lipid Panel No results for input(s): "CHOL", "HDL", "LDLCALC", "TRIG", "CHOLHDL", "LDLDIRECT" in the last 72 hours. Thyroid Function Tests No results for input(s): "TSH", "T4TOTAL", "T3FREE", "THYROIDAB" in the last 72 hours.  Invalid input(s): "FREET3"  Other results:   Imaging    VAS [place] UPPER EXTREMITY VENOUS DUPLEX  Result Date: 05/18/2023 UPPER VENOUS STUDY  Patient Name:  [name]  Date of Exam:   05/18/2023 Medical Rec #: [id number]          Accession #:    [id number] Date of Birth: Jul 24, 1946          Patient Gender: M Patient Age:  Summary:  Right: No evidence of superficial vein thrombosis in the upper extremity.  Left: No evidence of deep vein thrombosis in the upper extremity. No evidence of superficial vein  thrombosis in the upper extremity. Multiple areas of anechoic areas noted in the forearm and distal upper arm, etiology unknown. Interstitial edema noted throughout the upper extremity.  *See table(s) above for measurements and observations.  Diagnosing physician: Sherald Hess MD Electronically signed by Sherald Hess MD on 05/18/2023 at 1:44:05 PM.    Final    ECHOCARDIOGRAM COMPLETE  Result Date: 05/18/2023    ECHOCARDIOGRAM REPORT   Patient Name:   DYLIN BREEDEN Date of Exam: 05/18/2023 Medical Rec #:  [id number]         Height:       68.0 in Accession #:    [id number]        Weight:       165.6 lb Date of Birth:  Oct 11, 1945         BSA:          1.886 m Patient Age:    77 years          BP:           117/71 mmHg Patient Gender: M                 HR:           95 bpm. Exam Location:  Inpatient Procedure: 2D Echo, Cardiac Doppler, Color Doppler and Intracardiac            Opacification Agent Indications:    Arrhythmia  History:        Patient has prior history of Echocardiogram examinations, most                 recent 11/11/2022. Cardiomyopathy and CHF; Risk                 Factors:Hypertension.  Sonographer:    Darlys Gales Referring Phys: Terrilee Croak IMPRESSIONS  1. Left ventricular ejection fraction, by estimation, is <20%. The left ventricle has severely decreased function. The left ventricle demonstrates global hypokinesis. The left ventricular internal cavity size was severely dilated. There is mild left ventricular hypertrophy. Left ventricular diastolic function could not be evaluated.  2. Right ventricular systolic function is mildly reduced. The right ventricular size is mildly enlarged.  3. Left atrial size was mildly dilated.  4. The mitral valve is normal in structure. Mild mitral valve regurgitation.  5. The aortic valve is normal in structure. There is mild calcification of the aortic valve. Aortic valve regurgitation is mild. FINDINGS  Left Ventricle: Left ventricular ejection  fraction, by estimation, is <20%. The left ventricle has severely decreased function. The left ventricle demonstrates global hypokinesis. The left ventricular internal cavity size was severely dilated. There is mild left ventricular hypertrophy. Left ventricular diastolic function could not be evaluated due to atrial fibrillation. Left ventricular diastolic function could not be evaluated. Right Ventricle: The right ventricular size is mildly enlarged. No increase in right ventricular wall thickness. Right ventricular systolic function is mildly reduced. Left Atrium: Left atrial size was mildly dilated. Right Atrium: Right atrial size was normal in size. Pericardium: There is no evidence of pericardial effusion. Mitral Valve: The mitral valve is normal in structure. Mild mitral valve regurgitation. Tricuspid Valve: The tricuspid valve is normal in structure. Tricuspid valve regurgitation is mild. Aortic Valve: The aortic valve is normal in structure. There is mild calcification of the aortic  Advanced Heart Failure Rounding Note  PCP-Cardiologist: Olga Millers, MD   Subjective:    Milrinone dec. CO-OX 74% today on milrinone 0.125. NE off and on overnight (up to 2 mcg/min), currently off. CVP 5.  LFTs continue to trend down and Scr further improved to 2.4.   Right eye is itchy and painful.   Objective:   Weight Range: 73.3 kg Body mass index is 24.57 kg/m.   Vital Signs:   Temp:  [97.8 F (36.6 C)-98.4 F (36.9 C)] 98.3 F (36.8 C) (10/01 0400) Pulse Rate:  [83-102] 91 (10/01 0700) Resp:  [18-27] 22 (10/01 0700) BP: (91-123)/(52-81) 121/81 (10/01 0700) SpO2:  [92 %-100 %] 93 % (10/01 0700) Weight:  [73.3 kg] 73.3 kg (10/01 0700) Last BM Date : 05/16/23  Weight change: Filed Weights   05/17/23 0500 05/18/23 0500 05/19/23 0700  Weight: 76.8 kg 75.1 kg 73.3 kg    Intake/Output:   Intake/Output Summary (Last 24 hours) at 05/19/2023 0804 Last data filed at 05/19/2023 0700 Gross per 24 hour  Intake 961.55 ml  Output 1070 ml  Net -108.45 ml      Physical Exam    General:  Fatigued appearing HEENT: + erythema and swelling surrounding right eye Neck: Supple. No JVD . Carotids 2+ bilat; no bruits.  Cor: PMI nondisplaced. Irregular rhythm. No rubs, gallops or murmurs. Lungs: Clear Abdomen: Soft, nontender, nondistended.  Extremities: No cyanosis, clubbing, rash, + RUE PICC Neuro: Alert & orientedx3. Affect pleasant   Telemetry   Aflutter 80s-90s  Labs    CBC Recent Labs    05/18/23 0326 05/19/23 0430  WBC 9.0 9.2  HGB 11.4* 10.5*  HCT 32.9* 31.4*  MCV 91.4 92.9  PLT 155 128*   Basic Metabolic Panel Recent Labs    81/19/14 0326 05/18/23 1203 05/19/23 0430  NA 126* 130* 131*  K 2.9* 4.0 3.8  CL 87* 95* 97*  CO2 22 22 25   GLUCOSE 368* 238* 177*  BUN 43* 42* 43*  CREATININE 3.08* 2.78* 2.42*  CALCIUM 7.7* 8.0* 7.7*  MG 1.8  --  1.7   Liver Function Tests Recent Labs    05/18/23 0326 05/19/23 0430  AST 1,413* 664*   ALT 1,874* 1,333*  ALKPHOS 175* 162*  BILITOT 4.0* 4.1*  PROT 4.6* 4.6*  ALBUMIN 2.3* 2.3*   No results for input(s): "LIPASE", "AMYLASE" in the last 72 hours. Cardiac Enzymes No results for input(s): "CKTOTAL", "CKMB", "CKMBINDEX", "TROPONINI" in the last 72 hours.  BNP: BNP (last 3 results) Recent Labs    05/14/23 0946  BNP 4,176.1*    ProBNP (last 3 results) No results for input(s): "PROBNP" in the last 8760 hours.   D-Dimer No results for input(s): "DDIMER" in the last 72 hours. Hemoglobin A1C Recent Labs    05/19/23 0430  HGBA1C 6.0*   Fasting Lipid Panel No results for input(s): "CHOL", "HDL", "LDLCALC", "TRIG", "CHOLHDL", "LDLDIRECT" in the last 72 hours. Thyroid Function Tests No results for input(s): "TSH", "T4TOTAL", "T3FREE", "THYROIDAB" in the last 72 hours.  Invalid input(s): "FREET3"  Other results:   Imaging    VAS [place] UPPER EXTREMITY VENOUS DUPLEX  Result Date: 05/18/2023 UPPER VENOUS STUDY  Patient Name:  [name]  Date of Exam:   05/18/2023 Medical Rec #: [id number]          Accession #:    [id number] Date of Birth: Jul 24, 1946          Patient Gender: M Patient Age:  Advanced Heart Failure Rounding Note  PCP-Cardiologist: Olga Millers, MD   Subjective:    Milrinone dec. CO-OX 74% today on milrinone 0.125. NE off and on overnight (up to 2 mcg/min), currently off. CVP 5.  LFTs continue to trend down and Scr further improved to 2.4.   Right eye is itchy and painful.   Objective:   Weight Range: 73.3 kg Body mass index is 24.57 kg/m.   Vital Signs:   Temp:  [97.8 F (36.6 C)-98.4 F (36.9 C)] 98.3 F (36.8 C) (10/01 0400) Pulse Rate:  [83-102] 91 (10/01 0700) Resp:  [18-27] 22 (10/01 0700) BP: (91-123)/(52-81) 121/81 (10/01 0700) SpO2:  [92 %-100 %] 93 % (10/01 0700) Weight:  [73.3 kg] 73.3 kg (10/01 0700) Last BM Date : 05/16/23  Weight change: Filed Weights   05/17/23 0500 05/18/23 0500 05/19/23 0700  Weight: 76.8 kg 75.1 kg 73.3 kg    Intake/Output:   Intake/Output Summary (Last 24 hours) at 05/19/2023 0804 Last data filed at 05/19/2023 0700 Gross per 24 hour  Intake 961.55 ml  Output 1070 ml  Net -108.45 ml      Physical Exam    General:  Fatigued appearing HEENT: + erythema and swelling surrounding right eye Neck: Supple. No JVD . Carotids 2+ bilat; no bruits.  Cor: PMI nondisplaced. Irregular rhythm. No rubs, gallops or murmurs. Lungs: Clear Abdomen: Soft, nontender, nondistended.  Extremities: No cyanosis, clubbing, rash, + RUE PICC Neuro: Alert & orientedx3. Affect pleasant   Telemetry   Aflutter 80s-90s  Labs    CBC Recent Labs    05/18/23 0326 05/19/23 0430  WBC 9.0 9.2  HGB 11.4* 10.5*  HCT 32.9* 31.4*  MCV 91.4 92.9  PLT 155 128*   Basic Metabolic Panel Recent Labs    81/19/14 0326 05/18/23 1203 05/19/23 0430  NA 126* 130* 131*  K 2.9* 4.0 3.8  CL 87* 95* 97*  CO2 22 22 25   GLUCOSE 368* 238* 177*  BUN 43* 42* 43*  CREATININE 3.08* 2.78* 2.42*  CALCIUM 7.7* 8.0* 7.7*  MG 1.8  --  1.7   Liver Function Tests Recent Labs    05/18/23 0326 05/19/23 0430  AST 1,413* 664*   ALT 1,874* 1,333*  ALKPHOS 175* 162*  BILITOT 4.0* 4.1*  PROT 4.6* 4.6*  ALBUMIN 2.3* 2.3*   No results for input(s): "LIPASE", "AMYLASE" in the last 72 hours. Cardiac Enzymes No results for input(s): "CKTOTAL", "CKMB", "CKMBINDEX", "TROPONINI" in the last 72 hours.  BNP: BNP (last 3 results) Recent Labs    05/14/23 0946  BNP 4,176.1*    ProBNP (last 3 results) No results for input(s): "PROBNP" in the last 8760 hours.   D-Dimer No results for input(s): "DDIMER" in the last 72 hours. Hemoglobin A1C Recent Labs    05/19/23 0430  HGBA1C 6.0*   Fasting Lipid Panel No results for input(s): "CHOL", "HDL", "LDLCALC", "TRIG", "CHOLHDL", "LDLDIRECT" in the last 72 hours. Thyroid Function Tests No results for input(s): "TSH", "T4TOTAL", "T3FREE", "THYROIDAB" in the last 72 hours.  Invalid input(s): "FREET3"  Other results:   Imaging    VAS [place] UPPER EXTREMITY VENOUS DUPLEX  Result Date: 05/18/2023 UPPER VENOUS STUDY  Patient Name:  [name]  Date of Exam:   05/18/2023 Medical Rec #: [id number]          Accession #:    [id number] Date of Birth: Jul 24, 1946          Patient Gender: M Patient Age:  Advanced Heart Failure Rounding Note  PCP-Cardiologist: Olga Millers, MD   Subjective:    Milrinone dec. CO-OX 74% today on milrinone 0.125. NE off and on overnight (up to 2 mcg/min), currently off. CVP 5.  LFTs continue to trend down and Scr further improved to 2.4.   Right eye is itchy and painful.   Objective:   Weight Range: 73.3 kg Body mass index is 24.57 kg/m.   Vital Signs:   Temp:  [97.8 F (36.6 C)-98.4 F (36.9 C)] 98.3 F (36.8 C) (10/01 0400) Pulse Rate:  [83-102] 91 (10/01 0700) Resp:  [18-27] 22 (10/01 0700) BP: (91-123)/(52-81) 121/81 (10/01 0700) SpO2:  [92 %-100 %] 93 % (10/01 0700) Weight:  [73.3 kg] 73.3 kg (10/01 0700) Last BM Date : 05/16/23  Weight change: Filed Weights   05/17/23 0500 05/18/23 0500 05/19/23 0700  Weight: 76.8 kg 75.1 kg 73.3 kg    Intake/Output:   Intake/Output Summary (Last 24 hours) at 05/19/2023 0804 Last data filed at 05/19/2023 0700 Gross per 24 hour  Intake 961.55 ml  Output 1070 ml  Net -108.45 ml      Physical Exam    General:  Fatigued appearing HEENT: + erythema and swelling surrounding right eye Neck: Supple. No JVD . Carotids 2+ bilat; no bruits.  Cor: PMI nondisplaced. Irregular rhythm. No rubs, gallops or murmurs. Lungs: Clear Abdomen: Soft, nontender, nondistended.  Extremities: No cyanosis, clubbing, rash, + RUE PICC Neuro: Alert & orientedx3. Affect pleasant   Telemetry   Aflutter 80s-90s  Labs    CBC Recent Labs    05/18/23 0326 05/19/23 0430  WBC 9.0 9.2  HGB 11.4* 10.5*  HCT 32.9* 31.4*  MCV 91.4 92.9  PLT 155 128*   Basic Metabolic Panel Recent Labs    81/19/14 0326 05/18/23 1203 05/19/23 0430  NA 126* 130* 131*  K 2.9* 4.0 3.8  CL 87* 95* 97*  CO2 22 22 25   GLUCOSE 368* 238* 177*  BUN 43* 42* 43*  CREATININE 3.08* 2.78* 2.42*  CALCIUM 7.7* 8.0* 7.7*  MG 1.8  --  1.7   Liver Function Tests Recent Labs    05/18/23 0326 05/19/23 0430  AST 1,413* 664*   ALT 1,874* 1,333*  ALKPHOS 175* 162*  BILITOT 4.0* 4.1*  PROT 4.6* 4.6*  ALBUMIN 2.3* 2.3*   No results for input(s): "LIPASE", "AMYLASE" in the last 72 hours. Cardiac Enzymes No results for input(s): "CKTOTAL", "CKMB", "CKMBINDEX", "TROPONINI" in the last 72 hours.  BNP: BNP (last 3 results) Recent Labs    05/14/23 0946  BNP 4,176.1*    ProBNP (last 3 results) No results for input(s): "PROBNP" in the last 8760 hours.   D-Dimer No results for input(s): "DDIMER" in the last 72 hours. Hemoglobin A1C Recent Labs    05/19/23 0430  HGBA1C 6.0*   Fasting Lipid Panel No results for input(s): "CHOL", "HDL", "LDLCALC", "TRIG", "CHOLHDL", "LDLDIRECT" in the last 72 hours. Thyroid Function Tests No results for input(s): "TSH", "T4TOTAL", "T3FREE", "THYROIDAB" in the last 72 hours.  Invalid input(s): "FREET3"  Other results:   Imaging    VAS [place] UPPER EXTREMITY VENOUS DUPLEX  Result Date: 05/18/2023 UPPER VENOUS STUDY  Patient Name:  [name]  Date of Exam:   05/18/2023 Medical Rec #: [id number]          Accession #:    [id number] Date of Birth: Jul 24, 1946          Patient Gender: M Patient Age:  Advanced Heart Failure Rounding Note  PCP-Cardiologist: Olga Millers, MD   Subjective:    Milrinone dec. CO-OX 74% today on milrinone 0.125. NE off and on overnight (up to 2 mcg/min), currently off. CVP 5.  LFTs continue to trend down and Scr further improved to 2.4.   Right eye is itchy and painful.   Objective:   Weight Range: 73.3 kg Body mass index is 24.57 kg/m.   Vital Signs:   Temp:  [97.8 F (36.6 C)-98.4 F (36.9 C)] 98.3 F (36.8 C) (10/01 0400) Pulse Rate:  [83-102] 91 (10/01 0700) Resp:  [18-27] 22 (10/01 0700) BP: (91-123)/(52-81) 121/81 (10/01 0700) SpO2:  [92 %-100 %] 93 % (10/01 0700) Weight:  [73.3 kg] 73.3 kg (10/01 0700) Last BM Date : 05/16/23  Weight change: Filed Weights   05/17/23 0500 05/18/23 0500 05/19/23 0700  Weight: 76.8 kg 75.1 kg 73.3 kg    Intake/Output:   Intake/Output Summary (Last 24 hours) at 05/19/2023 0804 Last data filed at 05/19/2023 0700 Gross per 24 hour  Intake 961.55 ml  Output 1070 ml  Net -108.45 ml      Physical Exam    General:  Fatigued appearing HEENT: + erythema and swelling surrounding right eye Neck: Supple. No JVD . Carotids 2+ bilat; no bruits.  Cor: PMI nondisplaced. Irregular rhythm. No rubs, gallops or murmurs. Lungs: Clear Abdomen: Soft, nontender, nondistended.  Extremities: No cyanosis, clubbing, rash, + RUE PICC Neuro: Alert & orientedx3. Affect pleasant   Telemetry   Aflutter 80s-90s  Labs    CBC Recent Labs    05/18/23 0326 05/19/23 0430  WBC 9.0 9.2  HGB 11.4* 10.5*  HCT 32.9* 31.4*  MCV 91.4 92.9  PLT 155 128*   Basic Metabolic Panel Recent Labs    81/19/14 0326 05/18/23 1203 05/19/23 0430  NA 126* 130* 131*  K 2.9* 4.0 3.8  CL 87* 95* 97*  CO2 22 22 25   GLUCOSE 368* 238* 177*  BUN 43* 42* 43*  CREATININE 3.08* 2.78* 2.42*  CALCIUM 7.7* 8.0* 7.7*  MG 1.8  --  1.7   Liver Function Tests Recent Labs    05/18/23 0326 05/19/23 0430  AST 1,413* 664*   ALT 1,874* 1,333*  ALKPHOS 175* 162*  BILITOT 4.0* 4.1*  PROT 4.6* 4.6*  ALBUMIN 2.3* 2.3*   No results for input(s): "LIPASE", "AMYLASE" in the last 72 hours. Cardiac Enzymes No results for input(s): "CKTOTAL", "CKMB", "CKMBINDEX", "TROPONINI" in the last 72 hours.  BNP: BNP (last 3 results) Recent Labs    05/14/23 0946  BNP 4,176.1*    ProBNP (last 3 results) No results for input(s): "PROBNP" in the last 8760 hours.   D-Dimer No results for input(s): "DDIMER" in the last 72 hours. Hemoglobin A1C Recent Labs    05/19/23 0430  HGBA1C 6.0*   Fasting Lipid Panel No results for input(s): "CHOL", "HDL", "LDLCALC", "TRIG", "CHOLHDL", "LDLDIRECT" in the last 72 hours. Thyroid Function Tests No results for input(s): "TSH", "T4TOTAL", "T3FREE", "THYROIDAB" in the last 72 hours.  Invalid input(s): "FREET3"  Other results:   Imaging    VAS [place] UPPER EXTREMITY VENOUS DUPLEX  Result Date: 05/18/2023 UPPER VENOUS STUDY  Patient Name:  [name]  Date of Exam:   05/18/2023 Medical Rec #: [id number]          Accession #:    [id number] Date of Birth: Jul 24, 1946          Patient Gender: M Patient Age:

## 2023-05-19 NOTE — Procedures (Signed)
TRANSESOPHAGEAL ECHOCARDIOGRAM GUIDED DIRECT CURRENT CARDIOVERSION  NAME:  [name]    MRN: [id number] DOB:  [birth year]    ADMIT DATE: 05/15/2023  INDICATIONS: Symptomatic atrial flutter  PROCEDURE:   Informed consent was obtained prior to the procedure. The risks, benefits and alternatives for the procedure were discussed and the patient comprehended these risks.  Risks include, but are not limited to, cough, sore throat, vomiting, nausea, somnolence, esophageal and stomach trauma or perforation, bleeding, low blood pressure, aspiration, pneumonia, infection, trauma to the teeth and death.    After a procedural time-out, the oropharynx was anesthetized and the patient was sedated by the anesthesia service. The transesophageal probe was inserted in the esophagus and stomach without difficulty and multiple views were obtained. Anesthesia was monitored by Dr. Myrla Halsted.   COMPLICATIONS:    Complications: No complications Patient tolerated procedure well.  KEY FINDINGS:  Limited echo due to hypoxia:  - No LAA thrombus.  - Severely reduced LV function (LVEF 15%-20%) - Mild AI - Mildly reduced RV function.  - Full report to follow.   CARDIOVERSION:     Indications:  Symptomatic Atrial Flutter  Procedure Details:  Once the TEE was complete, the patient had the defibrillator pads placed in the anterior and posterior position. Once an appropriate level of sedation was confirmed, the patient was cardioverted x 1 with 200J of biphasic synchronized energy.  The patient converted to NSR.  There were no apparent complications.  The patient had normal neuro status and respiratory status post procedure with vitals stable as recorded elsewhere.  Adequate airway was maintained throughout and vital signs monitored per protocol.  Lorana Maffeo Advanced Heart Failure 12:04 PM

## 2023-05-19 NOTE — Progress Notes (Signed)
[facility] EDUCATION NOTE:                VAD evaluation consent reviewed and signed by pt Faythe Ghee.   VAD educational packet including "Understanding Your Options with Advanced Heart Failure", "[facility] Patient Agreement for VAD Evaluation and Potential Implantation" consent, and [employer] "Heartmate 3 Left Ventricular Device (LVAD) Patient Guide", Heartmate 3 Left Ventricular Assist System Patient Education Program DVD", "Justin HM III Patient Education", " Mechanical Circulatory Support Program", and "Decision Aids for Left Ventricular Assist Device" reviewed in detail and left at bedside for continued reference.   All questions answered regarding VAD implant, hospital stay, and what to expect when discharged home living with a heart pump. Pt identified wife Erasmus Bistline as his primary caregiver.  Explained need for 24/7 care when pt is discharged home due to sternal precautions, adaptation to living on support, emotional support, consistent and meticulous exit site care and management, medication adherence and high volume of follow up visits with the VAD Clinic after discharge; both pt and caregiver verbalized understanding of above.   Explained that LVAD can be implanted for two indications in the setting of advanced left ventricular heart failure treatment:  Bridge to transplant - used for patients who cannot safely wait for heart transplant without this device.  Or    Destination therapy - used for patients until end of life or recovery of heart function.  Patient and caregiver(s) acknowledge that the indication at this point in time for LVAD therapy would be for destination therapy due to age.   Reviewed and supplied a copy of home inspection check list stressing that only three pronged grounded power outlets can be used for VAD equipment. Pt confirmed home has electrical outlets that will support the equipment along with access working telephone.  Identified the  following lifestyle modifications while living on MCS:    1. No driving for at least three months and then only if doctor gives permission to do so.   2. No tub baths while pump implanted, and shower only when doctor gives permission.   3. No swimming or submersion in water while implanted with pump.   4. No contact sports or engaging in jumping activities.   5. Always have a backup controller, charged spare batteries, and battery clips nearby at all times in case of emergency.   6. Call the doctor or hospital contact person if any change in how the pump sounds, feels, or works.   7. Plan to sleep only when connected to the power module.   8. Do not sleep on your stomach.   9. Keep a backup system controller, charged batteries, battery clips, and flashlight near you during sleep in case of electrical power outage.   10. Exit site care including dressing changes, monitoring for infection, and importance of keeping percutaneous lead stabilized at all times.     Extended the option to have one of our current patients and caregiver(s) come to talk with them about living on support to assist with decision making.   Reviewed pictures of VAD drive line, site care, dressing changes, and drive line stabilization including securement attachment device and abdominal binder. Discussed with pt and family that they will be required to purchase dressing supplies as long as patient has the VAD in place.   Reinforced need for 24 hour/7 day week caregivers; pt designated wife Rangel Echeverri as caregivers. He will also need to abide by sternal precautions with no lifting >10lbs,  pushing, pulling and will need assistance with adapting to new life style with VAD equipment and care.   [facility] patient survival statistics through March 2024 reviewed with patient and caregiver as follows:    The patient understands that from this discussion it does not mean that they will receive the device, but that depends on an  extensive evaluation process. The patient is aware of the fact that if at anytime they want to stop the evaluation process they can.  All questions have been answered at this time and contact information was provided should they encounter any further questions.  They are both agreeable at this time to the evaluation process and will move forward.   Will plan on meeting pt's wife Revonda Standard tomorrow morning to discuss further and demonstrate equipment.  Simmie Davies RN,BSN VAD Coordinator   Office: [phone] 24/7 VAD Pager: [phone]

## 2023-05-19 NOTE — Progress Notes (Signed)
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4  Vital Signs: Temp: 98.2 F (36.8 C) (10/01 1630) Temp Source: Oral (10/01 1630) BP: 130/66 (10/01 1700) Pulse Rate: 71 (10/01 1700)  Labs: Recent Labs    05/17/23 0017 05/17/23 0420 05/18/23 0326 05/18/23 1203 05/18/23 2017 05/19/23 0429 05/19/23 0430 05/19/23 1647 05/19/23 1806  HGB 10.6*  --  11.4*  --   --   --  10.5*  --   --   HCT 31.4*  --  32.9*  --   --   --  31.4*  --   --   PLT 167  --  155  --   --   --  128*  --   --   APTT  --    < > 66* 51*   < > 51*  --  >200* 46*  LABPROT  --    < > 55.3* 34.9*  --   --  23.9*  --   --   INR  --    < > 6.2* 3.4*  --   --  2.1*  --   --   CREATININE 4.00*  --  3.08* 2.78*  --   --  2.42*  --   --    < > = values in this interval not displayed.    Estimated Creatinine Clearance: 25.5 mL/min (A) (by C-G formula based on SCr of 2.42 mg/dL (H)).   Medical History: Past Medical History:  Diagnosis Date   Abnormal chest CT    Anaphylactic reaction    Anxiety    Asthma    Benign hypertension    Cardiomyopathy    Cardiomyopathy, idiopathic (HCC)    CHF (congestive heart failure) (HCC)    Chronic renal insufficiency    With creatinine  1.4   Complication of anesthesia    Drug-induced urticaria    FH: thoracic aortic aneurysm    GERD (gastroesophageal reflux disease)    History of colon polyps    Hives    Long Hx of hives   Hyperlipidemia    Hyperlipidemia    IMPLANTATION OF DEFIBRILLATOR, HX OF 02/15/2009   Qualifier: Diagnosis of  By: Jens Som, MD, Lyn Hollingshead    NSVT (nonsustained ventricular tachycardia) (HCC)    Osteoarthritis    Osteoarthrosis, unspecified whether generalized or localized, lower leg    Osteoporosis    Oxygen deficiency    Psoriasis    PVC (premature ventricular contraction)    Urticaria    Secondary Alan COX-1 inhibitors    Medications:  Medications Prior Alan Admission  Medication Sig Dispense Refill Last Dose   amiodarone (PACERONE) 200 MG tablet TAKE 1 TABLET BY MOUTH EVERY DAY 90 tablet 1    apixaban (ELIQUIS) 5 MG TABS tablet Hold  while on heparin gtt      atorvastatin (LIPITOR) 10 MG tablet Take 1 tablet (10 mg total) by mouth daily. 90 tablet 1    carvedilol (COREG) 6.25 MG tablet Hold due Alan hypotension      cetirizine (ZYRTEC) 10 MG tablet Take 10 mg by mouth daily.      empagliflozin (JARDIANCE) 10 MG TABS tablet Hold.      EPINEPHrine 0.3 mg/0.3 mL IJ SOAJ injection Inject 0.3 mg into the muscle [name] needed for anaphylaxis.      famotidine (PEPCID) 20 MG tablet Take 20 mg by mouth daily.      feeding supplement (ENSURE ENLIVE / ENSURE PLUS) LIQD Take 237 mLs by mouth 2 (two) times daily between meals.      furosemide 200 mg in dextrose 5 % 80 mL Inject 6 mg/hr into the vein continuous.      hydrALAZINE (APRESOLINE) 25 MG tablet Hold due Alan hypotension.      isosorbide mononitrate (IMDUR) 30 MG 24 hr tablet Hold due Alan hypotension.      Menthol-Methyl Salicylate (SALONPAS PAIN RELIEF PATCH) 3-10 % PTCH Apply 1 application  topically daily.      midodrine (PROAMATINE) 5 MG tablet Take 1 tablet (5 mg total) by mouth 3 (three) times daily with meals.      [name] (PRIMACOR)  20 MG/100 ML SOLN infusion Inject 0.0191 mg/min into the vein continuous.      Multiple Vitamin (MULTIVITAMIN WITH MINERALS) TABS tablet Take 1 tablet by mouth daily.      spironolactone (ALDACTONE) 25 MG tablet Hold due Alan hypotension.      Scheduled:   Chlorhexidine Gluconate Cloth  6 each Topical Daily   ciprofloxacin  2 drop Right Eye Q4H while awake   feeding supplement  237 mL Oral BID BM   insulin aspart  0-5 Units Subcutaneous QHS   insulin aspart  0-6 Units Subcutaneous TID WC   midazolam       midodrine  2.5 mg Oral TID WC   pantoprazole (PROTONIX) IV  40 mg Intravenous Q12H   sodium chloride flush  10-40 mL Intracatheter Q12H   sodium chloride flush  3 mL Intravenous Q12H   Infusions:   sodium chloride     furosemide (LASIX) 200 mg in dextrose 5 % 100 mL (2 mg/mL) infusion 6 mg/hr (05/15/23 2300)   [name] 0.25 mcg/kg/min (05/15/23 2300)    Assessment: [name], [name], [name] patient has [name] with baseline CKD. INR >10 on 9/29 - receive vitamin K mg PO. Off anticoagulation on 9/29, aPTT returned at 66 > 51. UE duplex finding no evidence of DVT Alan multiple anechoic areas. Discussed with MD - will start anticoagulation with plans for possible DCCV this week.  10/1 0800: aPTT 51, therapeutic on 0.01 mg/kg/hr. Pink urine previously reported in urine, no blood in urine overnight per RN. Mild nose bleed overnight, Alan no other visible bleeding RN. CBC shows Hgb stable low at 10.5 and Plt down Alan 128. Scr stable at 2.42.   10/1 1900: aPTT >200 likely erroneous since drawn from opposite arm [name] bival  infusion and  not consistent with previous values. Recheck = 46 which is below goal. No issues with infusion or bleeding reported per RN.  Goal of Therapy:  aPTT 50-80 seconds Monitor platelets by anticoagulation protocol: Yes   Plan:  Increase bivalirudin by 20% Alan 0.012 mg/kg/hr Check aPTT in 4hs Daily aPTT, PT/INR, CBC  Thank you for allowing pharmacy Alan participate in this patient's care,  Loralee Pacas, PharmD, BCPS 05/19/2023 6:52 PM

## 2023-05-20 ENCOUNTER — Other Ambulatory Visit (HOSPITAL_COMMUNITY): Payer: Self-pay

## 2023-05-20 ENCOUNTER — Inpatient Hospital Stay (HOSPITAL_COMMUNITY): Payer: Medicare Other

## 2023-05-20 DIAGNOSIS — I4891 Unspecified atrial fibrillation: Secondary | ICD-10-CM | POA: Diagnosis not present

## 2023-05-20 DIAGNOSIS — I428 Other cardiomyopathies: Secondary | ICD-10-CM | POA: Diagnosis not present

## 2023-05-20 DIAGNOSIS — Z515 Encounter for palliative care: Secondary | ICD-10-CM

## 2023-05-20 DIAGNOSIS — Z01810 Encounter for preprocedural cardiovascular examination: Secondary | ICD-10-CM | POA: Diagnosis not present

## 2023-05-20 DIAGNOSIS — Z7189 Other specified counseling: Secondary | ICD-10-CM

## 2023-05-20 DIAGNOSIS — R570 Cardiogenic shock: Secondary | ICD-10-CM | POA: Diagnosis not present

## 2023-05-20 DIAGNOSIS — I5021 Acute systolic (congestive) heart failure: Secondary | ICD-10-CM | POA: Diagnosis not present

## 2023-05-20 LAB — BASIC METABOLIC PANEL
Anion gap: 11 (ref 5–15)
BUN: 38 mg/dL — ABNORMAL HIGH (ref 8–23)
CO2: 24 mmol/L (ref 22–32)
Calcium: 7.6 mg/dL — ABNORMAL LOW (ref 8.9–10.3)
Chloride: 95 mmol/L — ABNORMAL LOW (ref 98–111)
Creatinine, Ser: 1.65 mg/dL — ABNORMAL HIGH (ref 0.61–1.24)
GFR, Estimated: 43 mL/min — ABNORMAL LOW (ref >60)
Glucose, Bld: 199 mg/dL — ABNORMAL HIGH (ref 70–99)
Potassium: 4.3 mmol/L (ref 3.5–5.1)
Sodium: 130 mmol/L — ABNORMAL LOW (ref 135–145)

## 2023-05-20 LAB — T4, FREE: Free T4: 1.36 ng/dL — ABNORMAL HIGH (ref 0.61–1.12)

## 2023-05-20 LAB — COOXEMETRY PANEL
Carboxyhemoglobin: 2.7 % — ABNORMAL HIGH (ref 0.5–1.5)
Methemoglobin: 0.9 % (ref 0.0–1.5)
O2 Saturation: 71.5 %
Total hemoglobin: 11.2 g/dL — ABNORMAL LOW (ref 12.0–16.0)

## 2023-05-20 LAB — GLUCOSE, CAPILLARY
Glucose-Capillary: 167 mg/dL — ABNORMAL HIGH (ref 70–99)
Glucose-Capillary: 144 mg/dL — ABNORMAL HIGH (ref 70–99)
Glucose-Capillary: 126 mg/dL — ABNORMAL HIGH (ref 70–99)
Glucose-Capillary: 122 mg/dL — ABNORMAL HIGH (ref 70–99)

## 2023-05-20 LAB — MAGNESIUM: Magnesium: 2 mg/dL (ref 1.7–2.4)

## 2023-05-20 LAB — HEPATITIS B SURFACE ANTIBODY,QUALITATIVE: Hep B S Ab: NONREACTIVE

## 2023-05-20 LAB — CBC
HCT: 32.3 % — ABNORMAL LOW (ref 39.0–52.0)
Hemoglobin: 10.9 g/dL — ABNORMAL LOW (ref 13.0–17.0)
MCH: 32.2 pg (ref 26.0–34.0)
MCHC: 33.7 g/dL (ref 30.0–36.0)
MCV: 95.3 fL (ref 80.0–100.0)
Platelets: 125 10*3/uL — ABNORMAL LOW (ref 150–400)
RBC: 3.39 MIL/uL — ABNORMAL LOW (ref 4.22–5.81)
RDW: 14.3 % (ref 11.5–15.5)
WBC: 10.4 10*3/uL (ref 4.0–10.5)
nRBC: 0 % (ref 0.0–0.2)

## 2023-05-20 LAB — APTT
aPTT: 40 s — ABNORMAL HIGH (ref 24–36)
aPTT: 49 s — ABNORMAL HIGH (ref 24–36)
aPTT: 51 s — ABNORMAL HIGH (ref 24–36)

## 2023-05-20 LAB — PROTIME-INR
INR: 1.6 — ABNORMAL HIGH (ref 0.8–1.2)
Prothrombin Time: 19.3 s — ABNORMAL HIGH (ref 11.4–15.2)

## 2023-05-20 LAB — HEPATITIS B SURFACE ANTIGEN: Hepatitis B Surface Ag: NONREACTIVE

## 2023-05-20 LAB — VAS US DOPPLER PRE VAD
Left ABI: 1.27
Right ABI: 1.44

## 2023-05-20 LAB — ANTITHROMBIN III: AntiThromb III Func: 46 % — ABNORMAL LOW (ref 75–120)

## 2023-05-20 LAB — HIV ANTIBODY (ROUTINE TESTING W REFLEX): HIV Screen 4th Generation wRfx: NONREACTIVE

## 2023-05-20 LAB — ABO/RH: ABO/RH(D): A POS

## 2023-05-20 LAB — PREALBUMIN: Prealbumin: 6 mg/dL — ABNORMAL LOW (ref 18–38)

## 2023-05-20 LAB — HEPATITIS B CORE ANTIBODY, TOTAL: Hep B Core Total Ab: NONREACTIVE

## 2023-05-20 LAB — PSA: Prostatic Specific Antigen: 0.94 ng/mL (ref 0.00–4.00)

## 2023-05-20 LAB — URIC ACID: Uric Acid, Serum: 5.5 mg/dL (ref 3.7–8.6)

## 2023-05-20 LAB — LACTATE DEHYDROGENASE: LDH: 197 U/L — ABNORMAL HIGH (ref 98–192)

## 2023-05-20 MED ORDER — LOSARTAN POTASSIUM 25 MG PO TABS
12.5000 mg | ORAL_TABLET | Freq: Every day | ORAL | Status: DC
  Administered 2023-05-20: 12.5 mg via ORAL
  Filled 2023-05-20: qty 1

## 2023-05-20 MED ORDER — AMIODARONE HCL 200 MG PO TABS
200.0000 mg | ORAL_TABLET | Freq: Two times a day (BID) | ORAL | Status: DC
  Administered 2023-05-20 – 2023-05-24 (×9): 200 mg via ORAL
  Filled 2023-05-20 (×9): qty 1

## 2023-05-20 MED ORDER — SENNA 8.6 MG PO TABS
1.0000 | ORAL_TABLET | Freq: Two times a day (BID) | ORAL | Status: DC
  Administered 2023-05-20 – 2023-05-21 (×4): 8.6 mg via ORAL
  Filled 2023-05-20 (×4): qty 1

## 2023-05-20 MED ORDER — DOXAZOSIN MESYLATE 1 MG PO TABS
1.0000 mg | ORAL_TABLET | Freq: Every day | ORAL | Status: DC
  Administered 2023-05-20 – 2023-05-21 (×2): 1 mg via ORAL
  Filled 2023-05-20 (×3): qty 1

## 2023-05-20 MED ORDER — POLYETHYLENE GLYCOL 3350 17 G PO PACK
17.0000 g | PACK | Freq: Two times a day (BID) | ORAL | Status: DC
  Administered 2023-05-20 – 2023-05-21 (×3): 17 g via ORAL
  Filled 2023-05-20 (×3): qty 1

## 2023-05-20 NOTE — Consult Note (Addendum)
atelectasis is noted. No parenchymal nodules are seen. Musculoskeletal: Degenerative changes of the thoracic spine are noted. No acute rib abnormality is noted. CT ABDOMEN PELVIS FINDINGS Hepatobiliary: Gallbladder shows evidence of cholelithiasis. The liver is within normal limits. Pancreas: Pancreas is well visualized. Very mild inflammatory changes are noted surrounding the tail of the pancreas which may represent some very early pancreatitis. Correlation with laboratory values would be  helpful. Spleen: Normal in size without focal abnormality. Adrenals/Urinary Tract: Adrenal glands are within normal limits. Kidneys are well visualized bilaterally. No renal calculi or obstructive changes are noted. Parapelvic cysts are noted on the left. Additionally a 15 mm hyperdense cyst is noted arising from the lower pole of the left kidney. This has increased in size from 2019 but again likely represents a hyperdense proteinaceous cyst. No obstructive changes are noted. The bladder is decompressed by Foley catheter. Stomach/Bowel: Scattered diverticular changes noted without evidence of diverticulitis. The appendix has been surgically removed. Small bowel and stomach are unremarkable. Vascular/Lymphatic: Atherosclerotic calcifications of the aorta are noted. No aneurysmal dilatation is noted. No lymphadenopathy is seen. Reproductive: Prostate is unremarkable. Other: No abdominal wall hernia or abnormality. No abdominopelvic ascites. Musculoskeletal: Degenerative changes of lumbar spine are noted. IMPRESSION: CT of the chest: Small right pleural effusion with associated atelectatic changes. Mild left basilar atelectasis is seen as well. CT of the abdomen and pelvis: Findings consistent with cholelithiasis. Very mild peripancreatic inflammatory changes noted around the tail suspicious for early pancreatitis. Correlate with laboratory values. Diverticulosis without diverticulitis. Hyperdense cyst in the left kidney likely representing a proteinaceous cyst. Ultrasound may be helpful for further evaluation. Electronically Signed   By: Alcide Clever M.D.   On: 05/19/2023 22:02   DG Chest 2 View  Result Date: 05/19/2023 CLINICAL DATA:  Preoperative evaluation for LVAD placement, initial encounter EXAM: CHEST - 2 VIEW COMPARISON:  05/16/2023 FINDINGS: Cardiac shadow is enlarged. Defibrillator is again seen. Right PICC is noted in satisfactory position. Aortic calcifications are seen. Lungs are well aerated  bilaterally. Minimal basilar atelectasis is again seen and stable. IMPRESSION: Stable bibasilar atelectasis. Electronically Signed   By: Alcide Clever M.D.   On: 05/19/2023 20:21   DG Orthopantogram  Result Date: 05/19/2023 CLINICAL DATA:  Preop evaluation for LVAD placement, evaluate for dental abnormalities. EXAM: ORTHOPANTOGRAM/PANORAMIC COMPARISON:  None Available. FINDINGS: Panoramic view of the mandible shows no acute fracture. Multiple areas of dental hardware are noted. No periapical lucency to suggest abscess is seen. IMPRESSION: No definitive periapical lucencies are seen. Electronically Signed   By: Alcide Clever M.D.   On: 05/19/2023 20:20   ECHO TEE  Result Date: 05/19/2023 Alan Nettles, DO     05/19/2023 12:06 PM TRANSESOPHAGEAL ECHOCARDIOGRAM GUIDED DIRECT CURRENT CARDIOVERSION NAME:  [name]   MRN: [id number] DOB:  30-Jul-1946   ADMIT DATE: 05/15/2023 INDICATIONS: Symptomatic atrial flutter PROCEDURE: Informed consent was obtained prior to the procedure. The risks, benefits and alternatives for the procedure were discussed and the patient comprehended these risks.  Risks include, but are not limited to, cough, sore throat, vomiting, nausea, somnolence, esophageal and stomach trauma or perforation, bleeding, low blood pressure, aspiration, pneumonia, infection, trauma to the teeth and death.  After a procedural time-out, the oropharynx was anesthetized and the patient was sedated by the anesthesia service. The transesophageal probe was inserted in the esophagus and stomach without difficulty and multiple views were obtained. Anesthesia was monitored by Dr. Myrla Halsted. COMPLICATIONS:  Complications: No complications Patient tolerated procedure well. KEY  [address]            [place] [address]          [phone]       [name] [place] Medical Record #[id number] Date of Birth: Mar 14, 1946  Alan Kendall, MD [name], MD  Chief Complaint:    Chief Complaint  Patient presents with   CARDIOGENIC SHOCK  Patient examined, images of recent echocardiogram, CT of chest and abdomen, and last PFTs at [facility] personally reviewed.  History of Present Illness:     77 year old nondiabetic reformed smoker recently transferred from [facility] to the ICU after presenting with cardiogenic shock with lactate 4.5, creatinine 4.5, and coox 49%, INR> 10  sgot 6300.  Patient has a long history of heart disease with EF 25% dating back to 2004 at which time he was found to have normal coronaries and felt to have nonischemic cardiomyopathy.  This may have been related to long history of PVC burden.  He has remained fairly well compensated over the years having had a AICD implanted in 2007 [facility] [employer]) which was explanted in 2014 because it never fired.   He experienced clinical deterioration with increasing symptoms of heart failure this summer after developing atrial fibrillation for which he was cardioverted in August. He was placed on Eliquis without any significant bleeding issues.  He had multiple visits to the ED with symptoms of fatigue, weakness, and dizziness associated with probable low output CHF.  On his most recent admit he was admitted to the ICU and placed on milrinone and underwent a TEE cardioversion back to sinus rhythm from a flutter. His LVEF was 15 to 20% with moderate RV dysfunction, moderate MR, moderate TR and mild AI.  His Coox on milrinone sinus rhythm improved to 70%.  He remains on IV amiodarone and bivalirudin. Patient's renal and hepatic parameters are also improving.  Right heart cath is pending. The patient's functional status has been very poor over the past few months, unable  to tolerate any activity more than walking a few feet.  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  He is not a candidate for heparin and anticoagulation has been limited to oral NOAC and IV bivalirudin  Because of the patient's deterioration and onset of cardiogenic shock with severe LV dysfunction he is being considered for VAD mechanical support.     Current Activity/ Functional Status:  Bedridden unable to perform ADLs or walk in hall      Past Medical History:  Diagnosis Date   Abnormal chest CT    Anaphylactic reaction    Anxiety    Asthma    Benign hypertension    Cardiomyopathy    Cardiomyopathy, idiopathic (HCC)    CHF (congestive heart failure) (HCC)    Chronic renal insufficiency    With creatinine 1.4   Complication of anesthesia    Drug-induced urticaria    FH: thoracic aortic aneurysm    GERD (gastroesophageal reflux disease)    History of colon polyps    Hives    Long Hx of hives   Hyperlipidemia    Hyperlipidemia    IMPLANTATION OF DEFIBRILLATOR, HX OF 02/15/2009   Qualifier: Diagnosis of  By: Jens Som, MD, Lyn Hollingshead    NSVT (nonsustained ventricular tachycardia) (  atelectasis is noted. No parenchymal nodules are seen. Musculoskeletal: Degenerative changes of the thoracic spine are noted. No acute rib abnormality is noted. CT ABDOMEN PELVIS FINDINGS Hepatobiliary: Gallbladder shows evidence of cholelithiasis. The liver is within normal limits. Pancreas: Pancreas is well visualized. Very mild inflammatory changes are noted surrounding the tail of the pancreas which may represent some very early pancreatitis. Correlation with laboratory values would be  helpful. Spleen: Normal in size without focal abnormality. Adrenals/Urinary Tract: Adrenal glands are within normal limits. Kidneys are well visualized bilaterally. No renal calculi or obstructive changes are noted. Parapelvic cysts are noted on the left. Additionally a 15 mm hyperdense cyst is noted arising from the lower pole of the left kidney. This has increased in size from 2019 but again likely represents a hyperdense proteinaceous cyst. No obstructive changes are noted. The bladder is decompressed by Foley catheter. Stomach/Bowel: Scattered diverticular changes noted without evidence of diverticulitis. The appendix has been surgically removed. Small bowel and stomach are unremarkable. Vascular/Lymphatic: Atherosclerotic calcifications of the aorta are noted. No aneurysmal dilatation is noted. No lymphadenopathy is seen. Reproductive: Prostate is unremarkable. Other: No abdominal wall hernia or abnormality. No abdominopelvic ascites. Musculoskeletal: Degenerative changes of lumbar spine are noted. IMPRESSION: CT of the chest: Small right pleural effusion with associated atelectatic changes. Mild left basilar atelectasis is seen as well. CT of the abdomen and pelvis: Findings consistent with cholelithiasis. Very mild peripancreatic inflammatory changes noted around the tail suspicious for early pancreatitis. Correlate with laboratory values. Diverticulosis without diverticulitis. Hyperdense cyst in the left kidney likely representing a proteinaceous cyst. Ultrasound may be helpful for further evaluation. Electronically Signed   By: Alcide Clever M.D.   On: 05/19/2023 22:02   DG Chest 2 View  Result Date: 05/19/2023 CLINICAL DATA:  Preoperative evaluation for LVAD placement, initial encounter EXAM: CHEST - 2 VIEW COMPARISON:  05/16/2023 FINDINGS: Cardiac shadow is enlarged. Defibrillator is again seen. Right PICC is noted in satisfactory position. Aortic calcifications are seen. Lungs are well aerated  bilaterally. Minimal basilar atelectasis is again seen and stable. IMPRESSION: Stable bibasilar atelectasis. Electronically Signed   By: Alcide Clever M.D.   On: 05/19/2023 20:21   DG Orthopantogram  Result Date: 05/19/2023 CLINICAL DATA:  Preop evaluation for LVAD placement, evaluate for dental abnormalities. EXAM: ORTHOPANTOGRAM/PANORAMIC COMPARISON:  None Available. FINDINGS: Panoramic view of the mandible shows no acute fracture. Multiple areas of dental hardware are noted. No periapical lucency to suggest abscess is seen. IMPRESSION: No definitive periapical lucencies are seen. Electronically Signed   By: Alcide Clever M.D.   On: 05/19/2023 20:20   ECHO TEE  Result Date: 05/19/2023 Alan Nettles, DO     05/19/2023 12:06 PM TRANSESOPHAGEAL ECHOCARDIOGRAM GUIDED DIRECT CURRENT CARDIOVERSION NAME:  [name]   MRN: [id number] DOB:  30-Jul-1946   ADMIT DATE: 05/15/2023 INDICATIONS: Symptomatic atrial flutter PROCEDURE: Informed consent was obtained prior to the procedure. The risks, benefits and alternatives for the procedure were discussed and the patient comprehended these risks.  Risks include, but are not limited to, cough, sore throat, vomiting, nausea, somnolence, esophageal and stomach trauma or perforation, bleeding, low blood pressure, aspiration, pneumonia, infection, trauma to the teeth and death.  After a procedural time-out, the oropharynx was anesthetized and the patient was sedated by the anesthesia service. The transesophageal probe was inserted in the esophagus and stomach without difficulty and multiple views were obtained. Anesthesia was monitored by Dr. Myrla Halsted. COMPLICATIONS:  Complications: No complications Patient tolerated procedure well. KEY  atelectasis is noted. No parenchymal nodules are seen. Musculoskeletal: Degenerative changes of the thoracic spine are noted. No acute rib abnormality is noted. CT ABDOMEN PELVIS FINDINGS Hepatobiliary: Gallbladder shows evidence of cholelithiasis. The liver is within normal limits. Pancreas: Pancreas is well visualized. Very mild inflammatory changes are noted surrounding the tail of the pancreas which may represent some very early pancreatitis. Correlation with laboratory values would be  helpful. Spleen: Normal in size without focal abnormality. Adrenals/Urinary Tract: Adrenal glands are within normal limits. Kidneys are well visualized bilaterally. No renal calculi or obstructive changes are noted. Parapelvic cysts are noted on the left. Additionally a 15 mm hyperdense cyst is noted arising from the lower pole of the left kidney. This has increased in size from 2019 but again likely represents a hyperdense proteinaceous cyst. No obstructive changes are noted. The bladder is decompressed by Foley catheter. Stomach/Bowel: Scattered diverticular changes noted without evidence of diverticulitis. The appendix has been surgically removed. Small bowel and stomach are unremarkable. Vascular/Lymphatic: Atherosclerotic calcifications of the aorta are noted. No aneurysmal dilatation is noted. No lymphadenopathy is seen. Reproductive: Prostate is unremarkable. Other: No abdominal wall hernia or abnormality. No abdominopelvic ascites. Musculoskeletal: Degenerative changes of lumbar spine are noted. IMPRESSION: CT of the chest: Small right pleural effusion with associated atelectatic changes. Mild left basilar atelectasis is seen as well. CT of the abdomen and pelvis: Findings consistent with cholelithiasis. Very mild peripancreatic inflammatory changes noted around the tail suspicious for early pancreatitis. Correlate with laboratory values. Diverticulosis without diverticulitis. Hyperdense cyst in the left kidney likely representing a proteinaceous cyst. Ultrasound may be helpful for further evaluation. Electronically Signed   By: Alcide Clever M.D.   On: 05/19/2023 22:02   DG Chest 2 View  Result Date: 05/19/2023 CLINICAL DATA:  Preoperative evaluation for LVAD placement, initial encounter EXAM: CHEST - 2 VIEW COMPARISON:  05/16/2023 FINDINGS: Cardiac shadow is enlarged. Defibrillator is again seen. Right PICC is noted in satisfactory position. Aortic calcifications are seen. Lungs are well aerated  bilaterally. Minimal basilar atelectasis is again seen and stable. IMPRESSION: Stable bibasilar atelectasis. Electronically Signed   By: Alcide Clever M.D.   On: 05/19/2023 20:21   DG Orthopantogram  Result Date: 05/19/2023 CLINICAL DATA:  Preop evaluation for LVAD placement, evaluate for dental abnormalities. EXAM: ORTHOPANTOGRAM/PANORAMIC COMPARISON:  None Available. FINDINGS: Panoramic view of the mandible shows no acute fracture. Multiple areas of dental hardware are noted. No periapical lucency to suggest abscess is seen. IMPRESSION: No definitive periapical lucencies are seen. Electronically Signed   By: Alcide Clever M.D.   On: 05/19/2023 20:20   ECHO TEE  Result Date: 05/19/2023 Alan Nettles, DO     05/19/2023 12:06 PM TRANSESOPHAGEAL ECHOCARDIOGRAM GUIDED DIRECT CURRENT CARDIOVERSION NAME:  [name]   MRN: [id number] DOB:  30-Jul-1946   ADMIT DATE: 05/15/2023 INDICATIONS: Symptomatic atrial flutter PROCEDURE: Informed consent was obtained prior to the procedure. The risks, benefits and alternatives for the procedure were discussed and the patient comprehended these risks.  Risks include, but are not limited to, cough, sore throat, vomiting, nausea, somnolence, esophageal and stomach trauma or perforation, bleeding, low blood pressure, aspiration, pneumonia, infection, trauma to the teeth and death.  After a procedural time-out, the oropharynx was anesthetized and the patient was sedated by the anesthesia service. The transesophageal probe was inserted in the esophagus and stomach without difficulty and multiple views were obtained. Anesthesia was monitored by Dr. Myrla Halsted. COMPLICATIONS:  Complications: No complications Patient tolerated procedure well. KEY  [address]            [place] [address]          [phone]       [name] [place] Medical Record #[id number] Date of Birth: Mar 14, 1946  Alan Kendall, MD [name], MD  Chief Complaint:    Chief Complaint  Patient presents with   CARDIOGENIC SHOCK  Patient examined, images of recent echocardiogram, CT of chest and abdomen, and last PFTs at [facility] personally reviewed.  History of Present Illness:     77 year old nondiabetic reformed smoker recently transferred from [facility] to the ICU after presenting with cardiogenic shock with lactate 4.5, creatinine 4.5, and coox 49%, INR> 10  sgot 6300.  Patient has a long history of heart disease with EF 25% dating back to 2004 at which time he was found to have normal coronaries and felt to have nonischemic cardiomyopathy.  This may have been related to long history of PVC burden.  He has remained fairly well compensated over the years having had a AICD implanted in 2007 [facility] [employer]) which was explanted in 2014 because it never fired.   He experienced clinical deterioration with increasing symptoms of heart failure this summer after developing atrial fibrillation for which he was cardioverted in August. He was placed on Eliquis without any significant bleeding issues.  He had multiple visits to the ED with symptoms of fatigue, weakness, and dizziness associated with probable low output CHF.  On his most recent admit he was admitted to the ICU and placed on milrinone and underwent a TEE cardioversion back to sinus rhythm from a flutter. His LVEF was 15 to 20% with moderate RV dysfunction, moderate MR, moderate TR and mild AI.  His Coox on milrinone sinus rhythm improved to 70%.  He remains on IV amiodarone and bivalirudin. Patient's renal and hepatic parameters are also improving.  Right heart cath is pending. The patient's functional status has been very poor over the past few months, unable  to tolerate any activity more than walking a few feet.  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  He is not a candidate for heparin and anticoagulation has been limited to oral NOAC and IV bivalirudin  Because of the patient's deterioration and onset of cardiogenic shock with severe LV dysfunction he is being considered for VAD mechanical support.     Current Activity/ Functional Status:  Bedridden unable to perform ADLs or walk in hall      Past Medical History:  Diagnosis Date   Abnormal chest CT    Anaphylactic reaction    Anxiety    Asthma    Benign hypertension    Cardiomyopathy    Cardiomyopathy, idiopathic (HCC)    CHF (congestive heart failure) (HCC)    Chronic renal insufficiency    With creatinine 1.4   Complication of anesthesia    Drug-induced urticaria    FH: thoracic aortic aneurysm    GERD (gastroesophageal reflux disease)    History of colon polyps    Hives    Long Hx of hives   Hyperlipidemia    Hyperlipidemia    IMPLANTATION OF DEFIBRILLATOR, HX OF 02/15/2009   Qualifier: Diagnosis of  By: Jens Som, MD, Lyn Hollingshead    NSVT (nonsustained ventricular tachycardia) (  atelectasis is noted. No parenchymal nodules are seen. Musculoskeletal: Degenerative changes of the thoracic spine are noted. No acute rib abnormality is noted. CT ABDOMEN PELVIS FINDINGS Hepatobiliary: Gallbladder shows evidence of cholelithiasis. The liver is within normal limits. Pancreas: Pancreas is well visualized. Very mild inflammatory changes are noted surrounding the tail of the pancreas which may represent some very early pancreatitis. Correlation with laboratory values would be  helpful. Spleen: Normal in size without focal abnormality. Adrenals/Urinary Tract: Adrenal glands are within normal limits. Kidneys are well visualized bilaterally. No renal calculi or obstructive changes are noted. Parapelvic cysts are noted on the left. Additionally a 15 mm hyperdense cyst is noted arising from the lower pole of the left kidney. This has increased in size from 2019 but again likely represents a hyperdense proteinaceous cyst. No obstructive changes are noted. The bladder is decompressed by Foley catheter. Stomach/Bowel: Scattered diverticular changes noted without evidence of diverticulitis. The appendix has been surgically removed. Small bowel and stomach are unremarkable. Vascular/Lymphatic: Atherosclerotic calcifications of the aorta are noted. No aneurysmal dilatation is noted. No lymphadenopathy is seen. Reproductive: Prostate is unremarkable. Other: No abdominal wall hernia or abnormality. No abdominopelvic ascites. Musculoskeletal: Degenerative changes of lumbar spine are noted. IMPRESSION: CT of the chest: Small right pleural effusion with associated atelectatic changes. Mild left basilar atelectasis is seen as well. CT of the abdomen and pelvis: Findings consistent with cholelithiasis. Very mild peripancreatic inflammatory changes noted around the tail suspicious for early pancreatitis. Correlate with laboratory values. Diverticulosis without diverticulitis. Hyperdense cyst in the left kidney likely representing a proteinaceous cyst. Ultrasound may be helpful for further evaluation. Electronically Signed   By: Alcide Clever M.D.   On: 05/19/2023 22:02   DG Chest 2 View  Result Date: 05/19/2023 CLINICAL DATA:  Preoperative evaluation for LVAD placement, initial encounter EXAM: CHEST - 2 VIEW COMPARISON:  05/16/2023 FINDINGS: Cardiac shadow is enlarged. Defibrillator is again seen. Right PICC is noted in satisfactory position. Aortic calcifications are seen. Lungs are well aerated  bilaterally. Minimal basilar atelectasis is again seen and stable. IMPRESSION: Stable bibasilar atelectasis. Electronically Signed   By: Alcide Clever M.D.   On: 05/19/2023 20:21   DG Orthopantogram  Result Date: 05/19/2023 CLINICAL DATA:  Preop evaluation for LVAD placement, evaluate for dental abnormalities. EXAM: ORTHOPANTOGRAM/PANORAMIC COMPARISON:  None Available. FINDINGS: Panoramic view of the mandible shows no acute fracture. Multiple areas of dental hardware are noted. No periapical lucency to suggest abscess is seen. IMPRESSION: No definitive periapical lucencies are seen. Electronically Signed   By: Alcide Clever M.D.   On: 05/19/2023 20:20   ECHO TEE  Result Date: 05/19/2023 Alan Nettles, DO     05/19/2023 12:06 PM TRANSESOPHAGEAL ECHOCARDIOGRAM GUIDED DIRECT CURRENT CARDIOVERSION NAME:  [name]   MRN: [id number] DOB:  30-Jul-1946   ADMIT DATE: 05/15/2023 INDICATIONS: Symptomatic atrial flutter PROCEDURE: Informed consent was obtained prior to the procedure. The risks, benefits and alternatives for the procedure were discussed and the patient comprehended these risks.  Risks include, but are not limited to, cough, sore throat, vomiting, nausea, somnolence, esophageal and stomach trauma or perforation, bleeding, low blood pressure, aspiration, pneumonia, infection, trauma to the teeth and death.  After a procedural time-out, the oropharynx was anesthetized and the patient was sedated by the anesthesia service. The transesophageal probe was inserted in the esophagus and stomach without difficulty and multiple views were obtained. Anesthesia was monitored by Dr. Myrla Halsted. COMPLICATIONS:  Complications: No complications Patient tolerated procedure well. KEY  [address]            [place] [address]          [phone]       [name] [place] Medical Record #[id number] Date of Birth: Mar 14, 1946  Alan Kendall, MD [name], MD  Chief Complaint:    Chief Complaint  Patient presents with   CARDIOGENIC SHOCK  Patient examined, images of recent echocardiogram, CT of chest and abdomen, and last PFTs at [facility] personally reviewed.  History of Present Illness:     77 year old nondiabetic reformed smoker recently transferred from [facility] to the ICU after presenting with cardiogenic shock with lactate 4.5, creatinine 4.5, and coox 49%, INR> 10  sgot 6300.  Patient has a long history of heart disease with EF 25% dating back to 2004 at which time he was found to have normal coronaries and felt to have nonischemic cardiomyopathy.  This may have been related to long history of PVC burden.  He has remained fairly well compensated over the years having had a AICD [facility] [employer]) which was explanted in 2014 because it never fired.   He experienced clinical deterioration with increasing symptoms of heart failure this summer after developing atrial fibrillation for which he was cardioverted in August. He was placed on Eliquis without any significant bleeding issues.  He had multiple visits to the ED with symptoms of fatigue, weakness, and dizziness associated with probable low output CHF.  On his most recent admit he was admitted to the ICU and placed on milrinone and underwent a TEE cardioversion back to sinus rhythm from a flutter. His LVEF was 15 to 20% with moderate RV dysfunction, moderate MR, moderate TR and mild AI.  His Coox on milrinone sinus rhythm improved to 70%.  He remains on IV amiodarone and bivalirudin. Patient's renal and hepatic parameters are also improving.  Right heart cath is pending. The patient's functional status has been very poor over the past few months, unable  to tolerate any activity more than walking a few feet.  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  He is not a candidate for heparin and anticoagulation has been limited to oral NOAC and IV bivalirudin  Because of the patient's deterioration and onset of cardiogenic shock with severe LV dysfunction he is being considered for VAD mechanical support.     Current Activity/ Functional Status:  Bedridden unable to perform ADLs or walk in hall      Past Medical History:  Diagnosis Date   Abnormal chest CT    Anaphylactic reaction    Anxiety    Asthma    Benign hypertension    Cardiomyopathy    Cardiomyopathy, idiopathic (HCC)    CHF (congestive heart failure) (HCC)    Chronic renal insufficiency    With creatinine 1.4   Complication of anesthesia    Drug-induced urticaria    FH: thoracic aortic aneurysm    GERD (gastroesophageal reflux disease)    History of colon polyps    Hives    Long Hx of hives   Hyperlipidemia    Hyperlipidemia    IMPLANTATION OF DEFIBRILLATOR, HX OF 02/15/2009   Qualifier: Diagnosis of  By: Jens Som, MD, Lyn Hollingshead    NSVT (nonsustained ventricular tachycardia) (

## 2023-05-20 NOTE — Progress Notes (Signed)
CSW met with patient and wife at bedside to meet n greet and discuss psychosocial assessment. Patient resides at home with his wife who will be the primary caregiver. Patient's wife's sister is also in town from [place] and is a support to both and plans to stay indefinitely. CSW spoke briefly about caregiver role and explained the psychosocial assessment. CSW will await further medical workup and address assessment next week after further information is obtained and if patient appropriate VAD candidate. CSW available as needed. Lasandra Beech, LCSW, CCSW-MCS [phone]

## 2023-05-20 NOTE — Progress Notes (Signed)
Pre-VAD testing has been completed. Preliminary results can be found in CV Proc through chart review.   05/20/23 1:45 PM Olen Cordial RVT

## 2023-05-20 NOTE — Progress Notes (Signed)
ANTICOAGULATION CONSULT NOTE - Follow Up Consult  Pharmacy Consult for heparin Indication: atrial fibrillation  Labs: Recent Labs    05/18/23 0326 05/18/23 1203 05/18/23 2017 05/19/23 0430 05/19/23 1647 05/19/23 1806 05/19/23 2325 05/20/23 0012  HGB 11.4*  --   --  10.5*  --   --  10.9*  --   HCT 32.9*  --   --  31.4*  --   --  32.0*  --   PLT 155  --   --  128*  --   --   --   --   APTT 66* 51*   < >  --  >200* 46*  --  49*  LABPROT 55.3* 34.9*  --  23.9*  --   --   --   --   INR 6.2* 3.4*  --  2.1*  --   --   --   --   CREATININE 3.08* 2.78*  --  2.42*  --   --   --   --    < > = values in this interval not displayed.    Assessment: 77yo male still slightly subtherapeutic on bivalirudin after rate change; no infusion issues or signs of bleeding per RN.  Goal of Therapy:  aPTT 50-80 seconds   Plan:  Increase bival infusion by 20% to 0.014 mg/kg/hr. Check PTT in 4 hours.   Vernard Gambles, PharmD, BCPS 05/20/2023 1:32 AM

## 2023-05-20 NOTE — Consult Note (Cosign Needed Addendum)
Palliative Care Consult Note                                  Date: 05/20/2023   Patient Name: [name]  DOB: 02-Jul-1946  MRN: [id number]  Age / Sex: 77 y.o., male  PCP: [name], MD Referring Physician: Laurey Morale, MD;Sab*  Reason for Consultation:  LVAD Evaluation  HPI/Patient Profile: 77 y.o. 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  It appears he has had a significant decline since A-fib diagnosis in July 2024 and subsequent difficulty maintaining sinus rhythm.  Presented to [facility] ER 9/26 with history of weakness and progressive dyspnea.  He was admitted on 05/15/2023 with cardiogenic shock and multisystem organ dysfunction related to cardiogenic shock.   He has since begun evaluation for possible LVAD.  Palliative medicine was requested to consult due to possible LVAD consideration.  Past Medical History:  Diagnosis Date   Abnormal chest CT    Anaphylactic reaction    Anxiety    Asthma    Benign hypertension    Cardiomyopathy    Cardiomyopathy, idiopathic (HCC)    CHF (congestive heart failure) (HCC)    Chronic renal insufficiency    With creatinine 1.4   Complication of anesthesia    Drug-induced urticaria    FH: thoracic aortic aneurysm    GERD (gastroesophageal reflux disease)    History of colon polyps    Hives    Long Hx of hives   Hyperlipidemia    Hyperlipidemia    IMPLANTATION OF DEFIBRILLATOR, HX OF 02/15/2009   Qualifier: Diagnosis of  By: Jens Som, MD, Lyn Hollingshead    NSVT (nonsustained ventricular tachycardia) (HCC)    Osteoarthritis    Osteoarthrosis, unspecified whether generalized or localized, lower leg    Osteoporosis    Oxygen deficiency    Psoriasis    PVC (premature ventricular  contraction)    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    Subjective:   This NP Wynne Dust reviewed medical records, received report from team, assessed the patient and then meet at the patient's bedside to discuss diagnosis, prognosis, GOC, EOL wishes disposition and options.  I met with the patient at the bedside, no family was present initially.  Toward the end of our discussion the patient's wife Revonda Standard and sister-in-law Britta Mccreedy entered the room..   We meet to discuss diagnosis prognosis, GOC, EOL wishes, disposition and options. Concept of Palliative Care was introduced as specialized medical care for people and their families living with serious illness.  If focuses on providing relief from the symptoms and stress of a serious illness.  The goal is to improve quality of life for both the patient and the family. Values and goals of care important to patient and family were attempted to be elicited.  Created space and opportunity for patient  and family to explore thoughts and feelings regarding current medical situation   Natural trajectory and current clinical status were discussed. Questions and concerns addressed. Patient  encouraged to call with questions or concerns.    Patient/Family Understanding of Illness: The patient understands he has a very sick heart.  He understands that his kidneys are not doing well because of this.  He knows that he is undergoing evaluation for possible LVAD  placement.  He states that he understands his current situation quite well.  Life Review: The patient has been married to his wife Revonda Standard for 48 years.  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  Half of his career spent in [place] and half in [place].  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  He did spend time in [employer] and after  completing his service moved to [place] in 1971.  Patient Values: Family, independence, functional life  Goals: To get an LVAD fully get better  Today's Discussion: In addition to discussions described above we had extensive discussion on various topics.  We discussed that LVAD recovery is often difficult and there is a possibility of a long hospital stay including "ups and downs".  Often patients expressed it may be several months for they feel "normal" after the procedure.  There is often pain and discomfort, which we can treat, as well as depression or sleep issues possible.  He denies any history of depression.  He is currently having some sleep issues, especially last night due to being woken up for a "full body scan" at which point he was unable to go back to sleep.  He states that he is okay with the possibility of a difficult recovery.  His family is supportive and wants him to get the LVAD and he wants this as well.  He states that even if he does not return to his previous baseline that he can live with decreased function as long as he is "halfway healthy".  He discusses the visit he had today with the LVAD patient and states that this helped put him at ease.  He realized that after this procedure it is possible to have a fairly functional and normal life.  We discussed some possibilities of treatments to try to determine where his lines and the sand-like.  We discussed if he would ever want hemodialysis and he states that he would be okay with this if it was needed.  When we broach the idea of a possible feeding tube if he was at a point that he could not eat or drink for himself he states he would like to discuss this with his wife.  He does not think that he would want to live with a prolonged vent/tracheostomy.  I encouraged him to continue these conversations with his family to help define what his goals are.  We discussed the importance of knowing his goals and wishes should he reach a  clinical condition where he is unable to make his decisions for himself so that his family can voice his opinions for him.  I told him that palliative medicine would follow-up intermittently to continue conversations/disease.  Are from my ongoing support for him and his situation. I provided emotional and general support through therapeutic listening, empathy, sharing of stories, and other techniques. I answered all questions and addressed all concerns to the best of my ability.  Review of Systems  Constitutional:  Positive for fatigue.  Respiratory:  Negative for shortness of breath.   Cardiovascular:  Negative for chest pain.  Gastrointestinal:  Negative for abdominal pain, nausea and vomiting.  Neurological:  Positive for weakness.    Objective:   Primary Diagnoses: Present on Admission:  Acute HFrEF (heart failure with reduced ejection fraction) (HCC)   Physical Exam  Vitals and nursing note reviewed.  Constitutional:      General: He is not in acute distress.    Appearance: He is ill-appearing. He is not toxic-appearing.  HENT:     Head: Normocephalic and atraumatic.  Cardiovascular:     Rate and Rhythm: Normal rate and regular rhythm.  Pulmonary:     Effort: Pulmonary effort is normal. No respiratory distress.     Breath sounds: No wheezing or rhonchi.  Abdominal:     General: Abdomen is flat. Bowel sounds are normal. There is no distension.     Palpations: Abdomen is soft.  Skin:    General: Skin is warm and dry.  Neurological:     General: No focal deficit present.     Mental Status: He is alert.  Psychiatric:        Mood and Affect: Mood normal.        Behavior: Behavior normal.     Vital Signs:  BP 130/67   Pulse 75   Temp 97.9 F (36.6 C) (Oral)   Resp (!) 21   Ht 5\' 8"  (1.727 m)   Wt 73.7 kg   SpO2 97%   BMI 24.70 kg/m   Palliative Assessment/Data: 50%    Advanced Care Planning:   Existing Vynca/ACP Documentation: None  Primary Decision  Maker: PATIENT  Code Status/Advance Care Planning: Full code  A discussion was had today regarding advanced directives. Concepts specific to code status, artifical feeding and hydration, continued IV antibiotics and rehospitalization was had.  The difference between a aggressive medical intervention path and a palliative comfort care path for this patient at this time was had.   Decisions/Changes to ACP: None today  Assessment & Plan:   Impression: 77 year old male with chronic comorbidities and acute presentations as described above.  Our role today was palliative consult as part of LVAD evaluation.  I think the patient has a good understanding of his current health status.  He also has a good understanding of the potential for difficult recovery.  He feels he has good support from his family and is committed to continued attempts at LVAD knowing the risks.  I encouraged him to discuss his wishes in the area of feeding tubes, trach/prolonged vent, etc. with family to help them and voicing his wishes should he be unable to in the future.  I would recommend completing living will and HCPOA documentation and possibly a MOST form, especially if he is a candidate for LVAD.  Palliative medicine will continue to follow  SUMMARY OF RECOMMENDATIONS   Continue discussions with family on the wishes Continued emotional support of patient and family Palliative medicine will follow-up in a couple days to gauge progress on discussions about wishes We will attempt to complete advance care planning documentation if possible Palliative medicine will continue to follow  Symptom Management:  Per primary team PMT is available to assist with recommendations if needed  Prognosis:  Unable to determine  Discharge Planning:  To Be Determined   Discussed with: Patient, family, medical team, nursing team    Thank you for allowing [place] to participate in the care of MARSON SALO PMT will continue to  support holistically.  Time Total: 60 min Additional ACP Time: 20 min  Detailed review of medical records (labs, imaging, vital signs), medically appropriate exam, discussed with treatment team, counseling and education to patient, family, & staff, documenting clinical information, medication management, coordination of care  ACP time included explanation of advanced directives, recent  changes in the patient's health, discussion of healthcare wishes in the event they're unable to make those in the future. The visit was voluntary and included the patient, his wife Revonda Standard, sister-in-law Britta Mccreedy and Wynne Dust, NP with palliative medicine.  Signed by: Wynne Dust, NP Palliative Medicine Team  Team Phone # [phone] (Nights/Weekends)  05/20/2023, 3:36 PM

## 2023-05-20 NOTE — Progress Notes (Addendum)
Advanced Heart Failure Rounding Note  PCP-Cardiologist: Olga Millers, MD   Subjective:    CO-OX 71% off milrinone. CVP 4.   Scr further improved to 1.65. LFTs have continued to improve.   Maintaining SR after TEE/DCCV yesterday.  Very overwhelmed by events this admission and the thought of LVAD implant.    Objective:   Weight Range: 73.7 kg Body mass index is 24.7 kg/m.   Vital Signs:   Temp:  [97.9 F (36.6 C)-98.2 F (36.8 C)] 97.9 F (36.6 C) (10/02 0400) Pulse Rate:  [67-93] 74 (10/02 0700) Resp:  [13-28] 24 (10/02 0700) BP: (91-134)/(52-77) 108/57 (10/02 0700) SpO2:  [90 %-100 %] 96 % (10/02 0700) Weight:  [73.7 kg] 73.7 kg (10/02 0500) Last BM Date : 05/16/23  Weight change: Filed Weights   05/18/23 0500 05/19/23 0700 05/20/23 0500  Weight: 75.1 kg 73.3 kg 73.7 kg    Intake/Output:   Intake/Output Summary (Last 24 hours) at 05/20/2023 0750 Last data filed at 05/20/2023 0600 Gross per 24 hour  Intake 673.44 ml  Output 1050 ml  Net -376.56 ml     CVP 4 Physical Exam    General:  Sitting up in bed. HEENT: R eye injection improved Neck: supple. no JVD. Carotids 2+ bilat; no bruits.  Cor: PMI nondisplaced. Regular rate & rhythm. No rubs, gallops or murmurs. Lungs: clear Abdomen: soft, nontender, nondistended.  Extremities: no cyanosis, clubbing, rash, edema Neuro: alert & orientedx3. Tearful.    Telemetry   Aflutter 80s-90s  Labs    CBC Recent Labs    05/19/23 0430 05/19/23 2325 05/20/23 0421  WBC 9.2  --  10.4  HGB 10.5* 10.9* 10.9*  HCT 31.4* 32.0* 32.3*  MCV 92.9  --  95.3  PLT 128*  --  125*   Basic Metabolic Panel Recent Labs    33/29/51 0430 05/19/23 2325 05/20/23 0421  NA 131* 131* 130*  K 3.8 4.3 4.3  CL 97*  --  95*  CO2 25  --  24  GLUCOSE 177*  --  199*  BUN 43*  --  38*  CREATININE 2.42*  --  1.65*  CALCIUM 7.7*  --  7.6*  MG 1.7  --  2.0   Liver Function Tests Recent Labs    05/18/23 0326  05/19/23 0430  AST 1,413* 664*  ALT 1,874* 1,333*  ALKPHOS 175* 162*  BILITOT 4.0* 4.1*  PROT 4.6* 4.6*  ALBUMIN 2.3* 2.3*   No results for input(s): "LIPASE", "AMYLASE" in the last 72 hours. Cardiac Enzymes No results for input(s): "CKTOTAL", "CKMB", "CKMBINDEX", "TROPONINI" in the last 72 hours.  BNP: BNP (last 3 results) Recent Labs    05/14/23 0946  BNP 4,176.1*    ProBNP (last 3 results) No results for input(s): "PROBNP" in the last 8760 hours.   D-Dimer No results for input(s): "DDIMER" in the last 72 hours. Hemoglobin A1C Recent Labs    05/19/23 0430  HGBA1C 6.0*   Fasting Lipid Panel No results for input(s): "CHOL", "HDL", "LDLCALC", "TRIG", "CHOLHDL", "LDLDIRECT" in the last 72 hours. Thyroid Function Tests No results for input(s): "TSH", "T4TOTAL", "T3FREE", "THYROIDAB" in the last 72 hours.  Invalid input(s): "FREET3"  Other results:   Imaging    CT CHEST ABDOMEN PELVIS WO CONTRAST  Result Date: 05/19/2023 CLINICAL DATA:  Preoperative LVAD workup, initial encounter EXAM: CT CHEST, ABDOMEN AND PELVIS WITHOUT CONTRAST TECHNIQUE: Multidetector CT imaging of the chest, abdomen and pelvis was performed following the standard protocol without  IV contrast. RADIATION DOSE REDUCTION: This exam was performed according to the departmental dose-optimization program which includes automated exposure control, adjustment of the mA and/or kV according to patient size and/or use of iterative reconstruction technique. COMPARISON:  Chest x-ray from earlier in the same day. FINDINGS: CT CHEST FINDINGS Cardiovascular: Somewhat limited due to the lack of IV contrast. Atherosclerotic calcifications of the thoracic aorta are noted. No aneurysmal dilatation is seen. Heavy coronary calcifications are noted. Pacing device is again noted. Heart is mildly enlarged in size. Right PICC is noted in satisfactory position. Mediastinum/Nodes: Thoracic inlet is within normal limits. No hilar  or mediastinal adenopathy is noted. The esophagus as visualized is within normal limits. Lungs/Pleura: Lungs are well aerated bilaterally. Mosaic attenuation is noted consistent with some air trapping. Right basilar atelectasis and small effusion is seen. Minimal left basilar atelectasis is noted. No parenchymal nodules are seen. Musculoskeletal: Degenerative changes of the thoracic spine are noted. No acute rib abnormality is noted. CT ABDOMEN PELVIS FINDINGS Hepatobiliary: Gallbladder shows evidence of cholelithiasis. The liver is within normal limits. Pancreas: Pancreas is well visualized. Very mild inflammatory changes are noted surrounding the tail of the pancreas which may represent some very early pancreatitis. Correlation with laboratory values would be helpful. Spleen: Normal in size without focal abnormality. Adrenals/Urinary Tract: Adrenal glands are within normal limits. Kidneys are well visualized bilaterally. No renal calculi or obstructive changes are noted. Parapelvic cysts are noted on the left. Additionally a 15 mm hyperdense cyst is noted arising from the lower pole of the left kidney. This has increased in size from 2019 but again likely represents a hyperdense proteinaceous cyst. No obstructive changes are noted. The bladder is decompressed by Foley catheter. Stomach/Bowel: Scattered diverticular changes noted without evidence of diverticulitis. The appendix has been surgically removed. Small bowel and stomach are unremarkable. Vascular/Lymphatic: Atherosclerotic calcifications of the aorta are noted. No aneurysmal dilatation is noted. No lymphadenopathy is seen. Reproductive: Prostate is unremarkable. Other: No abdominal wall hernia or abnormality. No abdominopelvic ascites. Musculoskeletal: Degenerative changes of lumbar spine are noted. IMPRESSION: CT of the chest: Small right pleural effusion with associated atelectatic changes. Mild left basilar atelectasis is seen as well. CT of the  abdomen and pelvis: Findings consistent with cholelithiasis. Very mild peripancreatic inflammatory changes noted around the tail suspicious for early pancreatitis. Correlate with laboratory values. Diverticulosis without diverticulitis. Hyperdense cyst in the left kidney likely representing a proteinaceous cyst. Ultrasound may be helpful for further evaluation. Electronically Signed   By: Alcide Clever M.D.   On: 05/19/2023 22:02   DG Chest 2 View  Result Date: 05/19/2023 CLINICAL DATA:  Preoperative evaluation for LVAD placement, initial encounter EXAM: CHEST - 2 VIEW COMPARISON:  05/16/2023 FINDINGS: Cardiac shadow is enlarged. Defibrillator is again seen. Right PICC is noted in satisfactory position. Aortic calcifications are seen. Lungs are well aerated bilaterally. Minimal basilar atelectasis is again seen and stable. IMPRESSION: Stable bibasilar atelectasis. Electronically Signed   By: Alcide Clever M.D.   On: 05/19/2023 20:21   DG Orthopantogram  Result Date: 05/19/2023 CLINICAL DATA:  Preop evaluation for LVAD placement, evaluate for dental abnormalities. EXAM: ORTHOPANTOGRAM/PANORAMIC COMPARISON:  None Available. FINDINGS: Panoramic view of the mandible shows no acute fracture. Multiple areas of dental hardware are noted. No periapical lucency to suggest abscess is seen. IMPRESSION: No definitive periapical lucencies are seen. Electronically Signed   By: Alcide Clever M.D.   On: 05/19/2023 20:20   ECHO TEE  Result Date: 05/19/2023 [name], DO  05/19/2023 12:06 PM TRANSESOPHAGEAL ECHOCARDIOGRAM GUIDED DIRECT CURRENT CARDIOVERSION NAME:  [name] DUCKWALL   MRN: [id number] DOB:  1945/09/03   ADMIT DATE: 05/15/2023 INDICATIONS: Symptomatic atrial flutter PROCEDURE: Informed consent was obtained prior to the procedure. The risks, benefits and alternatives for the procedure were discussed and the patient comprehended these risks.  Risks include, but are not limited to, cough, sore throat,  vomiting, nausea, somnolence, esophageal and stomach trauma or perforation, bleeding, low blood pressure, aspiration, pneumonia, infection, trauma to the teeth and death.  After a procedural time-out, the oropharynx was anesthetized and the patient was sedated by the anesthesia service. The transesophageal probe was inserted in the esophagus and stomach without difficulty and multiple views were obtained. Anesthesia was monitored by Dr. Myrla Halsted. COMPLICATIONS:  Complications: No complications Patient tolerated procedure well. KEY FINDINGS: Limited echo due to hypoxia: - No LAA thrombus. - Severely reduced LV function (LVEF 15%-20%) - Mild AI - Mildly reduced RV function. - Full report to follow. CARDIOVERSION:   Indications:  Symptomatic Atrial Flutter Procedure Details: Once the TEE was complete, the patient had the defibrillator pads placed in the anterior and posterior position. Once an appropriate level of sedation was confirmed, the patient was cardioverted x 1 with 200J of biphasic synchronized energy.  The patient converted to NSR.  There were no apparent complications.  The patient had normal neuro status and respiratory status post procedure with vitals stable as recorded elsewhere.  Adequate airway was maintained throughout and vital signs monitored per protocol. Aditya Sabharwal Advanced Heart Failure 12:04 PM     Medications:     Scheduled Medications:  Chlorhexidine Gluconate Cloth  6 each Topical Daily   ciprofloxacin  2 drop Right Eye Q4H while awake   feeding supplement  237 mL Oral BID BM   insulin aspart  0-5 Units Subcutaneous QHS   insulin aspart  0-6 Units Subcutaneous TID WC   midodrine  2.5 mg Oral TID WC   pantoprazole (PROTONIX) IV  40 mg Intravenous Q12H   sodium chloride flush  10-40 mL Intracatheter Q12H   sodium chloride flush  3 mL Intravenous Q12H    Infusions:  sodium chloride     sodium chloride Stopped (05/16/23 1818)   amiodarone 30 mg/hr (05/20/23 0600)    bivalirudin (ANGIOMAX) 250 mg in sodium chloride 0.9 % 500 mL (0.5 mg/mL) infusion 0.014 mg/kg/hr (05/20/23 0600)   norepinephrine (LEVOPHED) Adult infusion 0 mcg/min (05/19/23 0930)    PRN Medications: sodium chloride, acetaminophen, loperamide, naphazoline-glycerin, ondansetron (ZOFRAN) IV, mouth rinse, sodium chloride, sodium chloride flush, sodium chloride flush    Patient Profile   77 y.o. male with chronic HFrEF secondary to nonischemic cardiomyopathy (diagnosed in 2004), persistent atrial fibrillation, hypertension, hyperlipidemia, thoracic aortic aneurysm currently admitted with cardiogenic shock.  Over the past several months he has had significant difficulty maintaining sinus rhythm. He underwent elective cardioversion in August 2024 with improvement in functional status for 2 to 3 weeks before he started having progressive fatigue and exertional dyspnea.  Most recently he was seen in cardiology clinic where he was back in atrial flutter.  Over the next several days he became rapidly volume overloaded and presented to [facility] emergency department in cardiogenic shock with a lactic acid of 4.7 and LFTs rising quickly to the thousands.  He was started on milrinone 0.125 mcg/kg/min and transferred to [facility].   Assessment/Plan   SCAI C cardiogenic shock 2/2 nonischemic cardiomyopathy -Heart failure dating back to 2004; diagnosed at [facility]  at that time EF of 25% with normal coronary arteries.  Endomyocardial biopsy was also negative.  Felt to be secondary to ventricular ectopy and was subsequently started on amiodarone. -EF of 35 to 40% in 2019-2021, however, since that time it has been 25 to 30% persistently. -Remained fairly well compensated over the past several months until difficulties with atrial tachyarrhythmias leading to current decompensation.  -Initial lab work concerning for cardiogenic shock with lactic acid elevated to 4.7, LFTs up to 6300/2500 with serum creatinine up to 4.5.   -Echo 05/18/23: EF < 20%, RV mildly reduced -CO-OX stable off milrinone. -CVP 4. Off diuretics.  -Stop midodrine today. Add losartan 12.5 later today if BP remains stable. -VAD workup initiated.    2.  Atrial fibrillation/atrial flutter - Echocardiogram in March 2024 with a EF of 25 to 30%.  Diagnosed with new onset atrial fibrillation in July 2024.  Underwent elective cardioversion in April 07, 2023 with some improvement afterwards. -Presented to cardiology clinic on 05/12/2023; at that time noted to be in atrial flutter  - Seen by EP. Plan for consideration of ablation as outpatient - S/p TEE/DCCV on 10/01. Maintaining SR this am.  - Anticoagulated with bival d/t alpha gal  3. AKI on CKD - sCr improving; 1.65 today - Continue to follow   4. Acute liver injury - AST/ALT on admission 235/138 with rise to 4500/2100 within hours. LFTs up to 6500 . LFTs improving significantly - Does not fit with amiodarone induced toxicity.  - Switch IV amio to po 200 mg BID - INR > 10 on admit, now 1.6 - bleeding from IV sites improved   5. Alpha-gal  - On argatroban for anticoagulation with undetectable PTT; transitioned to Bival   6. Left upper extremity swelling - Venous dopplers negative for DVT - Possible amiodarone extravasation  7. Conjunctivitis - Continue antibiotic drops  8. Nutrition - Prealbumin is 6.  - Encouraged PO intake. Drinking ensure shakes and appetite improving.  - Consult dietician  9. Urinary retention - Voiding trial. Remove foley.  - Start doxazosin  Length of Stay: 5  Rigby Leonhardt N, PA-C  05/20/2023, 7:50 AM  Advanced Heart Failure Team Pager [phone] (M-F; 7a - 5p)  Please contact [facility] for night-coverage after hours (5p -7a ) and weekends on [email or web address]

## 2023-05-20 NOTE — Progress Notes (Signed)
MCS EDUCATION NOTE:                VAD Coordinator met with pt and wife Revonda Standard at bedside today to provide further education about LVAD with [place] CSW. Caregiver role extensively explained to wife and she is in agreement to fulfill this role if pt deemed eligible.   Provided brief equipment overview and demonstration with HeartMate III training loop including discussion on the following:   a) mobile power unit b) system controller   c) universal Magazine features editor   d) battery clips   e) Batteries   f)  Perc lock   g) Percutaneous lead   Demonstrated and discussed:  a) changing power source on system controller from tethered (MPU) to untethered (battery) mode   b) changing power source on system controller from untethered (battery) to tethered (MPU) mode   c) how to monitor battery life both on the system controller and on each individual battery   d) changing batteries   One of our current patients visited with them today to answer questions about living on and caring for someone on MCS  The patient and wife understand that from this discussion it does not mean that they will receive the device, but that depends on an extensive evaluation process. The patient is aware of the fact that if at anytime they want to stop the evaluation process they can.  All questions have been answered at this time and contact information was provided should they encounter any further questions. They are both agreeable at this time to the evaluation process and will move forward. Will continue to follow.   Simmie Davies RN,BSN VAD Coordinator   Office: [phone] 24/7 VAD Pager: [phone]

## 2023-05-20 NOTE — Progress Notes (Signed)
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 Angioedema   Aspirin Other (See Comments)    Other Reaction: Other reaction   Indocin [Indomethacin] Other (See Comments)   Lactase-Lactobacillus Other (See Comments)   Lisinopril Other (See Comments)   Mobic [Meloxicam] Other (See Comments)   Naproxen Other (See Comments)   Nsaids Other (See Comments)    Other Reaction: Other reaction     Patient Measurements: Height: 5\' 8"  (172.7 cm) Weight: 73.7 kg (162 lb 7.7 oz) IBW/kg (Calculated) : 68.4  Vital Signs: BP: 121/55 (10/02 1800) Pulse Rate: 73 (10/02 1800)  Labs: Recent Labs    05/18/23 0326 05/18/23 1203 05/18/23 2017 05/19/23 0430 05/19/23 1647 05/19/23 2325 05/20/23 0012 05/20/23 0421 05/20/23 1639  HGB 11.4*  --   --  10.5*  --  10.9*  --  10.9*  --   HCT 32.9*  --   --  31.4*  --  32.0*  --  32.3*  --   PLT 155  --   --  128*  --   --   --  125*  --   APTT 66* 51*   < >  --    < >  --  49* 51* 40*  LABPROT 55.3* 34.9*  --  23.9*  --   --   --  19.3*  --   INR 6.2* 3.4*  --  2.1*  --   --   --  1.6*  --   CREATININE 3.08* 2.78*  --  2.42*  --   --   --  1.65*  --    < > = values in this interval not displayed.    Estimated Creatinine Clearance: 37.4 mL/min (A) (by C-G formula based on SCr of 1.65 mg/dL (H)).   Assessment: 77yo male tx'd from [facility] for acute on chronic HF and initiation of milrinone, to transition from Eliquis for Afib (last dose 9/27 am) IV anticoagultion. 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 Argatroban was initially started for him with known AKI, but hepatic function tests came back elevated indicating shock  liver. Patient was initially started on bivalirudin as patient has AKI with baseline CKD. INR >10 on 9/29 - receive vitamin K mg PO. Off anticoagulation on 9/29, aPTT returned at 66 > 51. UE duplex finding no evidence of DVT but multiple anechoic areas. Discussed with MD - will start anticoagulation with plans for possible DCCV this week.  aPTT 40 -subtherapeutic   Goal of Therapy:  aPTT 50-80 seconds Monitor platelets by anticoagulation protocol: Yes   Plan:  Increase bivalirudin to 0.018 mg/kg/hr Check aPTT in 4 hours Daily aPTT, PT/INR, CBC  Thank you for allowing pharmacy to participate in this patient's care,  Calton Dach, PharmD, BCCCP Clinical Pharmacist 05/20/2023 7:38 PM

## 2023-05-20 NOTE — Progress Notes (Signed)
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 Angioedema   Aspirin Other (See Comments)    Other Reaction: Other reaction   Indocin [Indomethacin] Other (See Comments)   Lactase-Lactobacillus Other (See Comments)   Lisinopril Other (See Comments)   Mobic [Meloxicam] Other (See Comments)   Naproxen Other (See Comments)   Nsaids Other (See Comments)    Other Reaction: Other reaction     Patient Measurements: Height: 5\' 8"  (172.7 cm) Weight: 73.3 kg (161 lb 9.6 oz) IBW/kg (Calculated) : 68.4  Vital Signs: Temp: 97.9 F (36.6 C) (10/02 0400) Temp Source: Oral (10/02 0400) BP: 113/56 (10/02 0500) Pulse Rate: 70 (10/02 0500)  Labs: Recent Labs    05/18/23 0326 05/18/23 1203 05/18/23 2017 05/19/23 0430 05/19/23 1647 05/19/23 1806 05/19/23 2325 05/20/23 0012 05/20/23 0421  HGB 11.4*  --   --  10.5*  --   --  10.9*  --  10.9*  HCT 32.9*  --   --  31.4*  --   --  32.0*  --  32.3*  PLT 155  --   --  128*  --   --   --   --  125*  APTT 66* 51*   < >  --    < > 46*  --  49* 51*  LABPROT 55.3* 34.9*  --  23.9*  --   --   --   --  19.3*  INR 6.2* 3.4*  --  2.1*  --   --   --   --  1.6*  CREATININE 3.08* 2.78*  --  2.42*  --   --   --   --   --    < > = values in this interval not displayed.    Estimated Creatinine Clearance: 25.5 mL/min (A) (by C-G formula based on SCr of 2.42 mg/dL (H)).   Medical History: Past Medical History:  Diagnosis Date   Abnormal chest CT    Anaphylactic reaction    Anxiety    Asthma    Benign hypertension    Cardiomyopathy    Cardiomyopathy, idiopathic (HCC)    CHF (congestive heart failure) (HCC)    Chronic renal insufficiency    With creatinine  1.4   Complication of anesthesia    Drug-induced urticaria    FH: thoracic aortic aneurysm    GERD (gastroesophageal reflux disease)    History of colon polyps    Hives    Long Hx of hives   Hyperlipidemia    Hyperlipidemia    IMPLANTATION OF DEFIBRILLATOR, HX OF 02/15/2009   Qualifier: Diagnosis of  By: Jens Som, MD, Lyn Hollingshead    NSVT (nonsustained ventricular tachycardia) (HCC)    Osteoarthritis    Osteoarthrosis, unspecified whether generalized or localized, lower leg    Osteoporosis    Oxygen deficiency    Psoriasis    PVC (premature ventricular contraction)    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 90 tablet 1    carvedilol (COREG) 6.25 MG tablet Hold due to hypotension      cetirizine (ZYRTEC) 10 MG tablet Take 10 mg by mouth daily.      empagliflozin (JARDIANCE) 10 MG TABS tablet Hold.      EPINEPHrine 0.3 mg/0.3 mL IJ SOAJ injection Inject 0.3 mg into the muscle as needed for anaphylaxis.      famotidine (PEPCID) 20 MG tablet Take 20 mg by mouth daily.      feeding supplement (ENSURE ENLIVE / ENSURE PLUS) LIQD Take 237 mLs by mouth 2 (two) times daily between meals.      furosemide 200 mg in dextrose 5 % 80 mL Inject 6 mg/hr into the vein continuous.      hydrALAZINE (APRESOLINE) 25 MG tablet Hold due to hypotension.      isosorbide mononitrate (IMDUR) 30 MG 24 hr tablet Hold due to hypotension.      Menthol-Methyl Salicylate (SALONPAS PAIN RELIEF PATCH) 3-10 % PTCH Apply 1 application  topically daily.      midodrine (PROAMATINE) 5 MG tablet Take 1 tablet (5 mg total) by mouth 3 (three) times daily with meals.      milrinone (PRIMACOR)  20 MG/100 ML SOLN infusion Inject 0.0191 mg/min into the vein continuous.      Multiple Vitamin (MULTIVITAMIN WITH MINERALS) TABS tablet Take 1 tablet by mouth daily.      spironolactone (ALDACTONE) 25 MG tablet Hold due to hypotension.      Scheduled:   Chlorhexidine Gluconate Cloth  6 each Topical Daily   ciprofloxacin  2 drop Right Eye Q4H while awake   feeding supplement  237 mL Oral BID BM   insulin aspart  0-5 Units Subcutaneous QHS   insulin aspart  0-6 Units Subcutaneous TID WC   midodrine  2.5 mg Oral TID WC   pantoprazole (PROTONIX) IV  40 mg Intravenous Q12H   sodium chloride flush  10-40 mL Intracatheter Q12H   sodium chloride flush  3 mL Intravenous Q12H   Infusions:   sodium chloride     furosemide (LASIX) 200 mg in dextrose 5 % 100 mL (2 mg/mL) infusion 6 mg/hr (05/15/23 2300)   milrinone 0.25 mcg/kg/min (05/15/23 2300)    Assessment: 77yo male tx'd from [facility] for acute on chronic HF and initiation of milrinone, to transition from Eliquis for Afib (last dose 9/27 am) IV anticoagultion. 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 Argatroban was initially started for him with known AKI, but hepatic function tests came back elevated indicating shock liver. Patient was initially started on bivalirudin as patient has AKI with baseline CKD. INR >10 on 9/29 - receive vitamin K mg PO. Off anticoagulation on 9/29, aPTT returned at 66 > 51. UE duplex finding no evidence of DVT but multiple anechoic areas. Discussed with MD - will start anticoagulation with plans for possible DCCV this week.  10/2 AM: aPTT 51, therapeutic on 0.014 mg/kg/hr. Per RN, no  issue with infusion running or signs of bleeding overnight. Hgb 10.9 and Plt 125, both low but stable today. SCr decreased to 1.65.   Goal of Therapy:  aPTT 50-80 seconds Monitor platelets by anticoagulation protocol: Yes   Plan:  Continue bivalirudin at 0.014 mg/kg/hr Check aPTT in 12  hours Daily aPTT, PT/INR, CBC  Thank you for allowing pharmacy to participate in this patient's care,  Enos Fling, PharmD PGY-1 Acute Care Pharmacy Resident 05/20/2023 6:04 AM

## 2023-05-21 ENCOUNTER — Encounter (HOSPITAL_COMMUNITY): Payer: Self-pay | Admitting: Cardiology

## 2023-05-21 ENCOUNTER — Other Ambulatory Visit (HOSPITAL_COMMUNITY): Payer: Self-pay

## 2023-05-21 DIAGNOSIS — I428 Other cardiomyopathies: Secondary | ICD-10-CM | POA: Diagnosis not present

## 2023-05-21 DIAGNOSIS — I5021 Acute systolic (congestive) heart failure: Secondary | ICD-10-CM | POA: Diagnosis not present

## 2023-05-21 DIAGNOSIS — E43 Unspecified severe protein-calorie malnutrition: Secondary | ICD-10-CM | POA: Insufficient documentation

## 2023-05-21 LAB — BASIC METABOLIC PANEL
Anion gap: 6 (ref 5–15)
BUN: 36 mg/dL — ABNORMAL HIGH (ref 8–23)
CO2: 25 mmol/L (ref 22–32)
Calcium: 7.6 mg/dL — ABNORMAL LOW (ref 8.9–10.3)
Chloride: 100 mmol/L (ref 98–111)
Creatinine, Ser: 1.62 mg/dL — ABNORMAL HIGH (ref 0.61–1.24)
GFR, Estimated: 44 mL/min — ABNORMAL LOW (ref >60)
Glucose, Bld: 122 mg/dL — ABNORMAL HIGH (ref 70–99)
Potassium: 3.7 mmol/L (ref 3.5–5.1)
Sodium: 131 mmol/L — ABNORMAL LOW (ref 135–145)

## 2023-05-21 LAB — CBC
HCT: 30.8 % — ABNORMAL LOW (ref 39.0–52.0)
Hemoglobin: 10.3 g/dL — ABNORMAL LOW (ref 13.0–17.0)
MCH: 31.7 pg (ref 26.0–34.0)
MCHC: 33.4 g/dL (ref 30.0–36.0)
MCV: 94.8 fL (ref 80.0–100.0)
Platelets: 120 10*3/uL — ABNORMAL LOW (ref 150–400)
RBC: 3.25 MIL/uL — ABNORMAL LOW (ref 4.22–5.81)
RDW: 14.3 % (ref 11.5–15.5)
WBC: 8.4 10*3/uL (ref 4.0–10.5)
nRBC: 0 % (ref 0.0–0.2)

## 2023-05-21 LAB — PROTIME-INR
INR: 1.6 — ABNORMAL HIGH (ref 0.8–1.2)
Prothrombin Time: 19.1 s — ABNORMAL HIGH (ref 11.4–15.2)

## 2023-05-21 LAB — APTT
aPTT: 40 s — ABNORMAL HIGH (ref 24–36)
aPTT: 44 s — ABNORMAL HIGH (ref 24–36)
aPTT: 45 s — ABNORMAL HIGH (ref 24–36)
aPTT: 48 s — ABNORMAL HIGH (ref 24–36)
aPTT: 48 s — ABNORMAL HIGH (ref 24–36)

## 2023-05-21 LAB — COOXEMETRY PANEL
Carboxyhemoglobin: 2.1 % — ABNORMAL HIGH (ref 0.5–1.5)
Methemoglobin: <0.7 % (ref 0.0–1.5)
O2 Saturation: 69.3 %
Total hemoglobin: 10.6 g/dL — ABNORMAL LOW (ref 12.0–16.0)

## 2023-05-21 LAB — HEPATITIS B SURFACE ANTIBODY, QUANTITATIVE: Hep B S AB Quant (Post): <3.5 m[IU]/mL — ABNORMAL LOW

## 2023-05-21 LAB — GLUCOSE, CAPILLARY: Glucose-Capillary: 134 mg/dL — ABNORMAL HIGH (ref 70–99)

## 2023-05-21 MED ORDER — SORBITOL 70 % SOLN
30.0000 mL | Freq: Once | Status: AC
  Administered 2023-05-21: 30 mL via ORAL
  Filled 2023-05-21: qty 30

## 2023-05-21 MED ORDER — GERHARDT'S BUTT CREAM: TOPICAL_CREAM | CUTANEOUS | Status: DC | PRN

## 2023-05-21 MED ORDER — POTASSIUM CHLORIDE CRYS ER 20 MEQ PO TBCR
40.0000 meq | EXTENDED_RELEASE_TABLET | Freq: Once | ORAL | Status: AC
  Administered 2023-05-21: 40 meq via ORAL
  Filled 2023-05-21: qty 2

## 2023-05-21 MED ORDER — DIGOXIN 125 MCG PO TABS
0.1250 mg | ORAL_TABLET | Freq: Every day | ORAL | Status: DC
  Administered 2023-05-21 – 2023-05-24 (×4): 0.125 mg via ORAL
  Filled 2023-05-21 (×4): qty 1

## 2023-05-21 NOTE — Progress Notes (Signed)
ANTICOAGULATION CONSULT NOTE Pharmacy Consult for bivalirudin Indication: atrial fibrillation Brief A/P: aPTT subtherapeutic Increase Heparin rate  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   Pork-Derived Products Anaphylaxis   Ace Inhibitors Other (See Comments)    Other Reaction: Other reaction/ Urticaria secondary to COX-1 inhibitors Angioedema   Aspirin Other (See Comments)    Other Reaction: Other reaction   Indocin [Indomethacin] Other (See Comments)   Lactase-Lactobacillus Other (See Comments)   Lisinopril Other (See Comments)   Mobic [Meloxicam] Other (See Comments)   Naproxen Other (See Comments)   Nsaids Other (See Comments)    Other Reaction: Other reaction     Patient Measurements: Height: 5\' 8"  (172.7 cm) Weight: 73.7 kg (162 lb 7.7 oz) IBW/kg (Calculated) : 68.4  Vital Signs: Temp: 98.7 F (37.1 C) (10/02 2316) Temp Source: Oral (10/02 2316) BP: 95/44 (10/03 0000) Pulse Rate: 64 (10/03 0000)  Labs: Recent Labs    05/18/23 0326 05/18/23 1203 05/18/23 2017 05/19/23 0430 05/19/23 1647 05/19/23 2325 05/20/23 0012 05/20/23 0421 05/20/23 1639 05/20/23 2341  HGB 11.4*  --   --  10.5*  --  10.9*  --  10.9*  --   --   HCT 32.9*  --   --  31.4*  --  32.0*  --  32.3*  --   --   PLT 155  --   --  128*  --   --   --  125*  --   --   APTT 66* 51*   < >  --    < >  --    < > 51* 40* 45*  LABPROT 55.3* 34.9*  --  23.9*  --   --   --  19.3*  --   --   INR 6.2* 3.4*  --  2.1*  --   --   --  1.6*  --   --   CREATININE 3.08* 2.78*  --  2.42*  --   --   --  1.65*  --   --    < > = values in this interval not displayed.    Estimated Creatinine Clearance: 37.4 mL/min (A) (by C-G formula based on SCr of 1.65 mg/dL (H)).   Assessment: 77 y.o. male with h/o Afib, Eliquis on hold, for bivalirudin  Goal of Therapy:  aPTT 50-80 seconds Monitor platelets by anticoagulation protocol: Yes   Plan:  Increase bivalirudin  to 0.025 mg/kg/hr Follow-up am labs.   Geannie Risen, PharmD, BCPS  05/21/2023 12:39 AM

## 2023-05-21 NOTE — Discharge Summary (Incomplete)
Advanced Heart Failure Team  Discharge Summary   Patient ID: [name] MRN: [id number], DOB/AGE: 04-05-1946 77 y.o. Admit date: 05/15/2023 D/C date:     05/24/2023   Primary Discharge Diagnoses:  SCAI C cardiogenic shock 2/2 nonischemic cardiomyopathy Atrial fibrillation/atrial flutter AKI on CKD Acute liver injury Alpha-gal  Hypotension  Secondary Discharge Diagnoses:  Left upper extremity swelling Nutrition Urinary retention Constipation   Hospital Course:   [name] is a 77 y.o. 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 Has had functional decline over the past few months with frequent symptomatic afib (diagnosed in 9/24) requiring cardioversion in 8/24 with improved functional status.  Was seen in EP clinic 9/24 in aflutter with fair compensation at the time. However, developed severe fluid overloaded and presented to [facility] in afib/aflutter with CGS and lactic acid 4.7, Cr 4.5, and liver shock, INR >10. He was started on Milrinone 0.125 mcg/kg/min transferred to [facility] CCU.   9/30 ECHO EF <20%. LV severely decreased function and hypokinesis. LV severely dilated w hypertrophy. RV mildly reduced and enlarged. Mild calcification on aortic valve with mild regurg.  Milrinone increased to 0.25 mcg/kg/min. Started Lasix gtt, Amio gtt, and Levophed gtt. Argatroban started but unable to achieve therapeutic PTT, transitioned to Bival. DCCV performed 10/1. Pt now in SR and resumed on home Amio 200 BID and Eliquis 5 BID. Milrinone successfully weaned with diuresis and addition of digoxin and midodrine, now euvolemic. Stably transferred from ICU to PCU. Midodrine no longer needed. GDMT restarted Cleda Daub, Digoxin, SGLT2i; holding ARB d/t hypotension IP).  LVAD w/u pending, complicated by Alpha gal.  Possible need for immunosuppression prior to receiving any therapy. Will discus at next MRB.  Follow up Appointment: AHF Clinic 10/14 at 75p.   Hospital Course by Problem List  SCAI C cardiogenic shock 2/2 nonischemic cardiomyopathy -Heart failure dating back to 2004; diagnosed at [facility] at that time EF of 25% with normal coronary arteries.  Endomyocardial biopsy was also negative.  Felt to be secondary to ventricular ectopy and was subsequently started on amiodarone. -EF of 35 to 40% in 2019-2021, however, since that time it has been 25 to 30% persistently. -Remained fairly well compensated over the past several months until difficulties with atrial tachyarrhythmias leading to current decompensation.  -Initial lab work concerning for cardiogenic shock with lactic acid elevated to 4.7, LFTs up to 6300/2500 with serum Cr up to 4.5.  -Echo 05/18/23: EF < 20%, RV mildly reduced -CO-OX stable at 77% with Fick CI > 3 off milrinone. -CVP 3-4. Now euvolemic. -GDMT difficult to initiate given intermittent hypotension requiring NE.  -Continue digoxin 0.125 mg daily -No beta blocker with recent shock -Continue Spiro 25 daily -Continue losartan 25mg  daily at bedtime -Can use Lasix 20 mg prn at home  -VAD workup initiated. Malnutrition and alpha gal may be biggest barriers to LVAD implant. Seen by TCTS. Would be high risk for bleeding with bivalirudin for anticoagulation peri-LVAD. May need referral to [facility] for consideration of high-risk VAD implant.   2.  Atrial fibrillation/atrial flutter - Echocardiogram in March 2024 with a EF of 25 to 30%.  Diagnosed with new onset atrial fibrillation in July 2024.  Underwent elective cardioversion in April 07, 2023 with some improvement afterwards. -Presented to cardiology clinic on 05/12/2023; at that time noted to be in atrial flutter  - Seen by EP. Plan for  consideration of ablation as outpatient - S/p TEE/DCCV on 10/01. Maintaining SR this am.  - AC with  Bival (now off) given alpha gal, difficult to manage given persistent subtherapeutic PTTs and alpha gal - Continue PO amiodarone 200 mg BID x10 days then 200mg  daily - Continue home Eliquis 5 mg   3. AKI on CKD - sCr improving; 1.3.2  4. Acute liver injury - AST/ALT on admission 235/138 with rise to 4500/2100 within hours. LFTs up to 6500 . LFTs improving significantly - Suspect shock liver with CHF.  - INR > 10 on admit, now 1.5   5. 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 Left upper extremity swelling - Venous dopplers negative for DVT - Possible amiodarone extravasation   7. Nutrition - Prealbumin is 6.  - Encouraged PO intake. Drinking ensure shakes and appetite improving.  - Dietician consulted   Discharge Weight Range: 164lbs Discharge Vitals: Blood pressure (!) 106/53, pulse 73, temperature 97.7 F (36.5 C), temperature source Oral, resp. rate 20, height 5\' 8"  (1.727 m), weight 74.7 kg, SpO2 95%.  Labs: Lab Results  Component Value Date   WBC 8.4 05/24/2023   HGB 10.5 (L) 05/24/2023   HCT 32.6 (L) 05/24/2023   MCV 96.4 05/24/2023   PLT 176 05/24/2023    Recent Labs  Lab 05/19/23 0430 05/19/23 2325 05/23/23 1440  NA 131*   < > 130*  K 3.8   < > 4.8  CL 97*   < > 101  CO2 25   < > 24  BUN 43*   < > 29*  CREATININE 2.42*   < > 1.32*  CALCIUM 7.7*   < > 7.6*  PROT 4.6*  --   --   BILITOT 4.1*  --   --   ALKPHOS 162*  --   --   ALT 1,333*  --   --   AST 664*  --   --   GLUCOSE 177*   < > 119*   < > = values in this interval not displayed.   Lab Results  Component Value Date   CHOL 199 07/22/2007   HDL 53.6 07/22/2007   LDLCALC 116 (H) 07/22/2007   TRIG 145 07/22/2007    BNP (last 3 results) Recent Labs    05/14/23 0946  BNP 4,176.1*    ProBNP (last 3 results) No results for input(s): "PROBNP" in the last 8760 hours.   Diagnostic Studies/Procedures   No results found.  Discharge Medications   Allergies as of 05/24/2023       Reactions   Beef Allergy Anaphylaxis   Meat [alpha-gal] Anaphylaxis   Pork-derived Products Anaphylaxis   Ace Inhibitors Other (See Comments)   Other Reaction: Other reaction/ Urticaria secondary to COX-1 inhibitors Angioedema   Aspirin Other (See Comments)   Other Reaction: Other reaction   Indocin [indomethacin] Other (See Comments)   Lactase-lactobacillus Other (See Comments)   Lisinopril Other (See Comments)   Mobic [meloxicam] Other (See Comments)   Naproxen Other (See Comments)   Nsaids Other (See Comments)   Other Reaction: Other reaction        Medication List     STOP taking these medications  carvedilol 6.25 MG tablet Commonly known as: COREG   furosemide 200 mg in dextrose 5 % 80 mL   hydrALAZINE 25 MG tablet Commonly known as: APRESOLINE   isosorbide mononitrate 30 MG 24 hr tablet Commonly known as: IMDUR   midodrine 5 MG tablet Commonly known as: PROAMATINE   milrinone 20 MG/100 ML Soln infusion Commonly known as: PRIMACOR   multivitamin with minerals Tabs tablet       TAKE these medications    amiodarone 200 MG tablet Commonly known as: PACERONE Take 1 tablet (200 mg total) by mouth 2 (two) times daily for 10 days, THEN 1 tablet (200 mg total) daily. Start taking on: May 24, 2023 What changed: See the new instructions.   apixaban 5 MG Tabs tablet Commonly known as: ELIQUIS Hold while on heparin gtt   atorvastatin 10 MG tablet Commonly known as: LIPITOR Take 1 tablet (10 mg total) by mouth daily.   cetirizine 10 MG tablet Commonly known as: ZYRTEC Take 10 mg by mouth daily.   digoxin 0.125 MG tablet Commonly known as: LANOXIN Take 1 tablet (0.125 mg  total) by mouth daily. Start taking on: May 25, 2023   empagliflozin 10 MG Tabs tablet Commonly known as: JARDIANCE Take 1 tablet (10 mg total) by mouth daily. Start taking on: May 25, 2023 What changed:  how much to take how to take this when to take this additional instructions   EPINEPHrine 0.3 mg/0.3 mL Soaj injection Commonly known as: EPI-PEN Inject 0.3 mg into the muscle as needed for anaphylaxis.   famotidine 20 MG tablet Commonly known as: PEPCID Take 20 mg by mouth daily.   feeding supplement Liqd Take 237 mLs by mouth 2 (two) times daily between meals.   furosemide 20 MG tablet Commonly known as: Lasix Take 1 tablet (20 mg total) by mouth daily as needed.   losartan 25 MG tablet Commonly known as: COZAAR Take 1 tablet (25 mg total) by mouth at bedtime.   Salonpas Pain Relief Patch 3-10 % Ptch Apply 1 application  topically daily.   spironolactone 25 MG tablet Commonly known as: ALDACTONE Take 1 tablet (25 mg total) by mouth daily. What changed:  how much to take how to take this when to take this additional instructions               Durable Medical Equipment  (From admission, onward)           Start     Ordered   05/22/23 1406  For home use only DME 4 wheeled rolling walker with seat  Once       Question Answer Comment  Patient needs a walker to treat with the following condition Heart failure [facility])   Patient needs a walker to treat with the following condition Physical deconditioning      05/22/23 1405            Disposition   The patient will be discharged in stable condition to home. Discharge Instructions     Diet - low sodium heart healthy   Complete by: As directed    Increase activity slowly   Complete by: As directed    No wound care   Complete by: As directed        Follow-up Information     [name], MD Follow up.   Specialty: Internal Medicine Why: hospital follow up appt schedule with PCP on  05/28/2023 at 915 am. please call to reschedule if you  are unable to keep appt Contact information: [facility] [address] [place] [address] [phone]          [facility] Follow up on 06/01/2023.   Specialty: Cardiology Why: 12 pm Contact information: [address] [place] [address] [phone]                  Duration of Discharge Encounter: Greater than 35 minutes   Signed, Laverda Page NP 05/24/2023, 10:09 AM

## 2023-05-21 NOTE — Evaluation (Signed)
Physical Therapy Evaluation Patient Details Name: [name] MRN: [id number] DOB: 06-Dec-1945 Today's Date: 05/21/2023  History of Present Illness  Pt is a 77 yo male presenting to ED 9/26 with a three week history of progressively worsening weakness and dyspnea with exertion. Per note, he has an extensive cardiac history including unstable A-fib with RVR (treated with cardioversion and planned ablations). Undergoing evaluation for LVAD placement. PMH includes paroxysmal A-fib, cardiomyopathy, CHF, GERD  Clinical Impression  Pt admitted with/for progressively worsening dyspnea, Now undergoing evaluation for LVAD placement.  Pt is presently needing CGA for OOB activity with weakness and poor endurance.  Pt currently limited functionally due to the problems listed below.  (see problems list.)  Pt will benefit from PT to maximize function and safety to be able to get home safely with available assist .         If plan is discharge home, recommend the following: A little help with walking and/or transfers;A little help with bathing/dressing/bathroom;Assistance with cooking/housework;Assist for transportation   Can travel by private vehicle        Equipment Recommendations Other (comment) (TBD if plan is to proceed to LVAD)  Recommendations for Other Services       Functional Status Assessment Patient has had a recent decline in their functional status and demonstrates the ability to make significant improvements in function in a reasonable and predictable amount of time.     Precautions / Restrictions Precautions Precautions:  (low fall risk)      Mobility  Bed Mobility Overal bed mobility: Modified Independent             General bed mobility comments: lowered HOB, but not flat.    Transfers Overall transfer level: Needs assistance Equipment used: None Transfers: Sit to/from Stand Sit to Stand: Supervision           General transfer comment: cued pt to push off  of the bed, otherwise no assist    Ambulation/Gait   Gait Distance (Feet): 190 Feet Assistive device: Rolling walker (2 wheels) Gait Pattern/deviations: Step-through pattern Gait velocity: slower Gait velocity interpretation: <1.8 ft/sec, indicate of risk for recurrent falls   General Gait Details: Episodes of unsteadiness using an unfamiliar AD.  Cues to maintain proximity to the RW and for posture.  Pleth poor, unable to read sats with activity.  Once stopped, sats 93% on 2L  HR in the 80's/90's  Stairs            Wheelchair Mobility     Tilt Bed    Modified Rankin (Stroke Patients Only)       Balance Overall balance assessment: Needs assistance Sitting-balance support: No upper extremity supported, Feet supported Sitting balance-Leahy Scale: Fair Sitting balance - Comments: EOB   Standing balance support: No upper extremity supported, Single extremity supported, During functional activity Standing balance-Leahy Scale: Fair                               Pertinent Vitals/Pain Pain Assessment Pain Assessment: No/denies pain    Home Living Family/patient expects to be discharged to:: Private residence Living Arrangements: Spouse/significant other Available Help at Discharge: Family;Available 24 hours/day Type of Home: House Home Access: Stairs to enter Entrance Stairs-Rails:  (rail on back with 8 STE) Entrance Stairs-Number of Steps: 2/8   Home Layout: Two level;Full bath on main level;Able to live on main level with bedroom/bathroom;Bed/bath upstairs (pt usually sleeps upstairs) Home Equipment:  None      Prior Function Prior Level of Function : Independent/Modified Independent;Driving               ADLs Comments: Independent with ADL's     Extremity/Trunk Assessment   Upper Extremity Assessment Upper Extremity Assessment: Generalized weakness    Lower Extremity Assessment Lower Extremity Assessment: Generalized weakness     Cervical / Trunk Assessment Cervical / Trunk Assessment: Normal  Communication   Communication Communication: No apparent difficulties  Cognition Arousal: Alert Behavior During Therapy: WFL for tasks assessed/performed Overall Cognitive Status: Within Functional Limits for tasks assessed                                          General Comments      Exercises     Assessment/Plan    PT Assessment Patient needs continued PT services  PT Problem List Decreased strength;Decreased activity tolerance;Decreased mobility;Decreased knowledge of use of DME;Cardiopulmonary status limiting activity;Decreased balance       PT Treatment Interventions DME instruction;Gait training;Stair training;Functional mobility training;Therapeutic activities;Therapeutic exercise;Balance training;Patient/family education    PT Goals (Current goals can be found in the Care Plan section)  Acute Rehab PT Goals Patient Stated Goal: Feel better... PT Goal Formulation: With patient Time For Goal Achievement: 06/04/23 Potential to Achieve Goals: Good    Frequency Min 1X/week     Co-evaluation               AM-PAC PT "6 Clicks" Mobility  Outcome Measure Help needed turning from your back to your side while in a flat bed without using bedrails?: None Help needed moving from lying on your back to sitting on the side of a flat bed without using bedrails?: None Help needed moving to and from a bed to a chair (including a wheelchair)?: A Little Help needed standing up from a chair using your arms (e.g., wheelchair or bedside chair)?: A Little Help needed to walk in hospital room?: A Little Help needed climbing 3-5 steps with a railing? : A Little 6 Click Score: 20    End of Session Equipment Utilized During Treatment: Oxygen Activity Tolerance: Patient tolerated treatment well Patient left: in chair;with call bell/phone within reach Nurse Communication: Mobility status PT  Visit Diagnosis: Unsteadiness on feet (R26.81);Muscle weakness (generalized) (M62.81);Difficulty in walking, not elsewhere classified (R26.2)    Time: 2130-8657 PT Time Calculation (min) (ACUTE ONLY): 27 min   Charges:   PT Evaluation $PT Eval Moderate Complexity: 1 Mod PT Treatments $Gait Training: 8-22 mins PT General Charges $$ ACUTE PT VISIT: 1 Visit         05/21/2023  Jacinto Halim., PT Acute Rehabilitation Services [phone]  (office)  Eliseo Gum Harith Mccadden 05/21/2023, 5:06 PM

## 2023-05-21 NOTE — Progress Notes (Signed)
Initial Nutrition Assessment  DOCUMENTATION CODES:   Severe malnutrition in context of chronic illness  INTERVENTION:  Liberalize to regular diet given poor appetite/PO intake to promote nutritional adequacy in preparation for possible VAD Snacks TID between meals (blueberry yogurt, cottage cheese, fruit, peanut butter, crackers, cereal) Ensure Enlive po BID, each supplement provides 350 kcal and 20 grams of protein. MVI with minerals daily Recommend SLP evaluation; HF team agreeable  NUTRITION DIAGNOSIS:   Severe Malnutrition related to chronic illness (HF. persistent afib) as evidenced by energy intake < or equal to 75% for > or equal to 1 month, mild fat depletion, severe muscle depletion, moderate muscle depletion, edema.  GOAL:   Patient will meet greater than or equal to 90% of their needs  MONITOR:   PO intake, Supplement acceptance, Labs, Weight trends, Diet advancement  REASON FOR ASSESSMENT:   Consult Assessment of nutrition requirement/status, LVAD Eval  ASSESSMENT:   Pt presented to [facility] d/t volume overload, SOB and cardiogenic shock. Pt transferred to [facility] for further heart failure workup.  PMH significant for alpha-gal, HFrEF d/t NICM, persistent afib, thoracic aortic aneurysm, HTN, HLD.  10/1: s/p TEE/DCCV  Spoke with pt and his wife at bedside. Pt noticeably drowsy at time of visit. Wife assisted in providing nutrition related history. Pt was eating at his baseline up until about 6 weeks ago. His typical intake includes chicken, [place] and fish with vegetables and a carbohydrate. Pt with 6 year history of alpha-gal. Over the last 6 weeks, he has had a significant decline in appetite and his intake had begun gradually trending down. Over the last 2 weeks he has not consumed any meaningful nutrition. He started consuming Ensure at home but was difficult to intake even one supplement daily. Endorses recent episodes of n/v and dry heaves.   During admission he  mentions that his intake and appetite is better but they report over all he is consuming less than 50% of each meal. Unfortunately, there are no documented meal completions on file to review during admission. This morning he reports that he ate 100% of one pancake and a few bites of oatmeal. He also reports drinking at least 2-3 ensure daily during admission.   Pt noted to have episode of coughing with sip of water. Pt states that this is new but often has coughing episodes at any given time.   Pt reports having a small degree of weight loss PTA. Per review of documented weight history, since March, pt's weight has fluctuated between 74-77 kg. No significant weight loss noted, however pt admitted with volume overload and currently with presence of edema which could falsely elevated weight. Currently dry weight is unknown.   Edema: mild pitting generalized/RUE; moderate pitting LUE; mild pitting BLE  Medications: miralax, klor-con, senna, sorbitol  Labs: sodium 131, BUN 36, Cr 1.62, GFR 44  CBG's 122-167 x24 hours  UOP: x24 hours I/O's: - since admit  NUTRITION - FOCUSED PHYSICAL EXAM:  Flowsheet Row Most Recent Value  Orbital Region Mild depletion  Upper Arm Region Moderate depletion  [unable to assess LUE d/t edema]  Thoracic and Lumbar Region No depletion  Buccal Region Mild depletion  Temple Region Moderate depletion  Clavicle Bone Region Severe depletion  Clavicle and Acromion Bone Region Moderate depletion  Scapular Bone Region Moderate depletion  Dorsal Hand Moderate depletion  Patellar Region Severe depletion  Anterior Thigh Region Severe depletion  Posterior Calf Region Moderate depletion  Edema (RD Assessment) Mild  [generalized]  Hair  Reviewed  Eyes Reviewed  Mouth Reviewed  Skin Reviewed  Nails Reviewed      Diet Order:   Diet Order             Diet regular Room service appropriate? Yes; Fluid consistency: Thin  Diet effective now                    EDUCATION NEEDS:   Education needs have been addressed  Skin:  Skin Assessment: Skin Integrity Issues: Skin Integrity Issues:: Stage III Stage III: mid-sacrum  Last BM:  9/28  Height:   Ht Readings from Last 1 Encounters:  05/15/23 5\' 8"  (1.727 m)    Weight:   Wt Readings from Last 1 Encounters:  05/21/23 74.8 kg   BMI:  Body mass index is 25.07 kg/m.  Estimated Nutritional Needs:   Kcal:  2000-2200  Protein:  100-115g  Fluid:  >/=2L  Drusilla Kanner, RDN, LDN Clinical Nutrition

## 2023-05-21 NOTE — Progress Notes (Signed)
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 Angioedema   Aspirin Other (See Comments)    Other Reaction: Other reaction   Indocin [Indomethacin] Other (See Comments)   Lactase-Lactobacillus Other (See Comments)   Lisinopril Other (See Comments)   Mobic [Meloxicam] Other (See Comments)   Naproxen Other (See Comments)   Nsaids Other (See Comments)    Other Reaction: Other reaction     Patient Measurements: Height: 5\' 8"  (172.7 cm) Weight: 77.9 kg (171 lb 11.8 oz) IBW/kg (Calculated) : 68.4  Vital Signs: Temp: 97.7 F (36.5 C) (10/03 1900) Temp Source: Oral (10/03 1900) BP: 118/49 (10/03 2000) Pulse Rate: 77 (10/03 2000)  Labs: Recent Labs    05/19/23 0430 05/19/23 1647 05/19/23 2325 05/20/23 0012 05/20/23 0421 05/20/23 1639 05/21/23 0321 05/21/23 1442 05/21/23 1839 05/21/23 2205  HGB 10.5*  --  10.9*  --  10.9*  --  10.3*  --   --   --   HCT 31.4*  --  32.0*  --  32.3*  --  30.8*  --   --   --   PLT 128*  --   --   --  125*  --  120*  --   --   --   APTT  --    < >  --    < > 51*   < > 48* 48* 44* 40*  LABPROT 23.9*  --   --   --  19.3*  --  19.1*  --   --   --   INR 2.1*  --   --   --  1.6*  --  1.6*  --   --   --   CREATININE 2.42*  --   --   --  1.65*  --  1.62*  --   --   --    < > = values in this interval not displayed.    Estimated Creatinine Clearance: 38.1 mL/min (A) (by C-G formula based on SCr of 1.62 mg/dL (H)).   Assessment: 77yo male tx'd from [facility] for acute on chronic HF and initiation of milrinone, to transition from Eliquis for Afib (last dose 9/27 am) IV anticoagultion. 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  Argatroban was initially started for him with known AKI, but hepatic function tests came back elevated indicating shock liver. Patient was initially started on bivalirudin as patient has AKI with baseline CKD. INR >10 on 9/29 - receive vitamin K mg PO. Off anticoagulation on 9/29, aPTT returned at 66 > 51. UE duplex finding no evidence of DVT but multiple anechoic areas. Discussed with MD - will start anticoagulation with plans for possible DCCV this week.  10/3 0321: aPTT 48, subtherapeutic on 0.025 mg/kg/hr. Per RN, no issue with infusion running or signs of bleeding overnight. CBC stable (Hgb 10.3, Plt 125). Scr stable at 1.62.   10/3 1442: Repeat aPTT 48 seconds, subtherapeutic on bivalirudin 0.03 mg/kg/hr. Increase bivalirudin to 0.035 mg/kg/hr and recheck aPTT in 4 hours.   10/3 2205: aPTT 40, subtherapeutic on bivalirudin 0.035 mg/kg/hr. No issues with the infusion or bleeding reported per RN.   Goal of Therapy:  aPTT 50-80 seconds Monitor platelets by anticoagulation protocol:  Yes   Plan:  Increase bivalirudin to 0.042 mg/kg/hr Check aPTT in 4 hours Daily aPTT, PT/INR, CBC  Loralee Pacas, PharmD, BCPS 05/21/2023 10:27 PM  Please check AMION for all [facility] Pharmacy phone numbers After 10:00 PM, call Main Pharmacy [phone]

## 2023-05-21 NOTE — Progress Notes (Signed)
Brief cardiology note:  Called overnight that patient's MAPs had dropped to 58-59 while sleeping. Patient was asymptomatic, mentating well, no dizziness or syncope. Per chart review, pt was formerly on midodrine which was stopped today and losartan was started. PT is currently undergoing LVAD workup, not actively being diuresed.  Plan: - In the setting of low MAPs, will d/c losartan - ok for low-dose levo with goal MAP 60. Hope to titrate off by AM  Willette Alma MD MPH [facility]

## 2023-05-21 NOTE — Progress Notes (Signed)
Brief [facility] rounding note:  VAD Coordinator met with pt and wife Revonda Standard at bedside today. Pt states he enjoyed meeting one of our current pt's yesterday. Pt and wife have no questions at this time about VAD. They are concerned about the overall risk of the surgery at his age and with his current functional status. VAD Coordinator allowed space for pt and wife to voice their thoughts. VAD team will continue to follow.  Simmie Davies RN, BSN VAD Coordinator 24/7 Pager [phone]

## 2023-05-21 NOTE — Progress Notes (Signed)
PTV      Full                                                        +---------+---------------+---------+-----------+----------+--------------+ PERO     Full                                                        +---------+---------------+---------+-----------+----------+--------------+     Summary: RIGHT: - Findings consistent with age indeterminate superficial vein thrombosis involving the right small saphenous vein.  - There is no evidence of deep vein thrombosis in the lower extremity.  - No cystic structure found in the popliteal fossa.  LEFT: - There is no evidence of deep vein thrombosis in the lower extremity.  - No cystic  structure found in the popliteal fossa.  *See table(s) above for measurements and observations. Electronically signed by Sherald Hess MD on 05/20/2023 at 3:48:35 PM.    Final    VAS US DOPPLER PRE VAD  Result Date: 05/20/2023 PERIOPERATIVE VASCULAR EVALUATION Patient Name:  [name]  Date of Exam:   05/20/2023 Medical Rec #: [id number]          Accession #:    [id number] Date of Birth: Dec 21, 1945          Patient Gender: M Patient Age:   77 years Exam Location:  [facility] Procedure:      VAS US DOPPLER PRE VAD Referring Phys: [name] --------------------------------------------------------------------------------  Indications:      Pre-VAD. Risk Factors:     None. Limitations:      Restricted right arm Comparison Study: No prior studies. Performing Technologist: Olen Cordial RVT  Examination Guidelines: A complete evaluation includes B-mode imaging, spectral Doppler, color Doppler, and power Doppler as needed of all accessible portions of each vessel. Bilateral testing is considered an integral part of a complete examination. Limited examinations for reoccurring indications may be performed as noted.  Right Carotid Findings: +----------+--------+--------+--------+-----------------------+--------+           PSV cm/sEDV cm/sStenosisDescribe               Comments +----------+--------+--------+--------+-----------------------+--------+ CCA Prox  58      8               smooth and heterogenoustortuous +----------+--------+--------+--------+-----------------------+--------+ CCA Distal50      9               smooth and heterogenous         +----------+--------+--------+--------+-----------------------+--------+ ICA Prox  40      14              smooth and heterogenous         +----------+--------+--------+--------+-----------------------+--------+ ICA Mid   72      20                                               +----------+--------+--------+--------+-----------------------+--------+ ICA Distal101     31  Joslin  Date of Exam:   05/20/2023 Medical Rec #: [id number]          Accession #:    [id number] Date of Birth: [birth year]          Patient Gender: M Patient Age:   7 years Exam Location:  [facility] Procedure:      VAS [place] LOWER EXTREMITY VENOUS (DVT) Referring Phys: [name] --------------------------------------------------------------------------------  Indications: Pre-VAD.  Risk Factors: None identified. Comparison Study: No prior studies. Performing Technologist: Chanda Busing RVT  Examination Guidelines: A complete evaluation includes B-mode imaging, spectral Doppler, color Doppler, and power Doppler as needed of all accessible portions of each vessel. Bilateral testing is considered an integral part of a complete examination. Limited examinations for reoccurring indications may be performed as noted. The reflux portion of  the exam is performed with the patient in reverse Trendelenburg.  +---------+---------------+---------+-----------+----------+-----------------+ RIGHT    CompressibilityPhasicitySpontaneityPropertiesThrombus Aging    +---------+---------------+---------+-----------+----------+-----------------+ CFV      Full           Yes      Yes                                    +---------+---------------+---------+-----------+----------+-----------------+ SFJ      Full                                                           +---------+---------------+---------+-----------+----------+-----------------+ FV Prox  Full                                                           +---------+---------------+---------+-----------+----------+-----------------+ FV Mid   Full                                                           +---------+---------------+---------+-----------+----------+-----------------+ FV DistalFull                                                           +---------+---------------+---------+-----------+----------+-----------------+ PFV      Full                                                           +---------+---------------+---------+-----------+----------+-----------------+ POP      Full           Yes      Yes                                    +---------+---------------+---------+-----------+----------+-----------------+  PTV      Full                                                        +---------+---------------+---------+-----------+----------+--------------+ PERO     Full                                                        +---------+---------------+---------+-----------+----------+--------------+     Summary: RIGHT: - Findings consistent with age indeterminate superficial vein thrombosis involving the right small saphenous vein.  - There is no evidence of deep vein thrombosis in the lower extremity.  - No cystic structure found in the popliteal fossa.  LEFT: - There is no evidence of deep vein thrombosis in the lower extremity.  - No cystic  structure found in the popliteal fossa.  *See table(s) above for measurements and observations. Electronically signed by Sherald Hess MD on 05/20/2023 at 3:48:35 PM.    Final    VAS US DOPPLER PRE VAD  Result Date: 05/20/2023 PERIOPERATIVE VASCULAR EVALUATION Patient Name:  [name]  Date of Exam:   05/20/2023 Medical Rec #: [id number]          Accession #:    [id number] Date of Birth: Dec 21, 1945          Patient Gender: M Patient Age:   77 years Exam Location:  [facility] Procedure:      VAS US DOPPLER PRE VAD Referring Phys: [name] --------------------------------------------------------------------------------  Indications:      Pre-VAD. Risk Factors:     None. Limitations:      Restricted right arm Comparison Study: No prior studies. Performing Technologist: Olen Cordial RVT  Examination Guidelines: A complete evaluation includes B-mode imaging, spectral Doppler, color Doppler, and power Doppler as needed of all accessible portions of each vessel. Bilateral testing is considered an integral part of a complete examination. Limited examinations for reoccurring indications may be performed as noted.  Right Carotid Findings: +----------+--------+--------+--------+-----------------------+--------+           PSV cm/sEDV cm/sStenosisDescribe               Comments +----------+--------+--------+--------+-----------------------+--------+ CCA Prox  58      8               smooth and heterogenoustortuous +----------+--------+--------+--------+-----------------------+--------+ CCA Distal50      9               smooth and heterogenous         +----------+--------+--------+--------+-----------------------+--------+ ICA Prox  40      14              smooth and heterogenous         +----------+--------+--------+--------+-----------------------+--------+ ICA Mid   72      20                                               +----------+--------+--------+--------+-----------------------+--------+ ICA Distal101     31  Joslin  Date of Exam:   05/20/2023 Medical Rec #: [id number]          Accession #:    [id number] Date of Birth: [birth year]          Patient Gender: M Patient Age:   7 years Exam Location:  [facility] Procedure:      VAS [place] LOWER EXTREMITY VENOUS (DVT) Referring Phys: [name] --------------------------------------------------------------------------------  Indications: Pre-VAD.  Risk Factors: None identified. Comparison Study: No prior studies. Performing Technologist: Chanda Busing RVT  Examination Guidelines: A complete evaluation includes B-mode imaging, spectral Doppler, color Doppler, and power Doppler as needed of all accessible portions of each vessel. Bilateral testing is considered an integral part of a complete examination. Limited examinations for reoccurring indications may be performed as noted. The reflux portion of  the exam is performed with the patient in reverse Trendelenburg.  +---------+---------------+---------+-----------+----------+-----------------+ RIGHT    CompressibilityPhasicitySpontaneityPropertiesThrombus Aging    +---------+---------------+---------+-----------+----------+-----------------+ CFV      Full           Yes      Yes                                    +---------+---------------+---------+-----------+----------+-----------------+ SFJ      Full                                                           +---------+---------------+---------+-----------+----------+-----------------+ FV Prox  Full                                                           +---------+---------------+---------+-----------+----------+-----------------+ FV Mid   Full                                                           +---------+---------------+---------+-----------+----------+-----------------+ FV DistalFull                                                           +---------+---------------+---------+-----------+----------+-----------------+ PFV      Full                                                           +---------+---------------+---------+-----------+----------+-----------------+ POP      Full           Yes      Yes                                    +---------+---------------+---------+-----------+----------+-----------------+  Joslin  Date of Exam:   05/20/2023 Medical Rec #: [id number]          Accession #:    [id number] Date of Birth: [birth year]          Patient Gender: M Patient Age:   7 years Exam Location:  [facility] Procedure:      VAS [place] LOWER EXTREMITY VENOUS (DVT) Referring Phys: [name] --------------------------------------------------------------------------------  Indications: Pre-VAD.  Risk Factors: None identified. Comparison Study: No prior studies. Performing Technologist: Chanda Busing RVT  Examination Guidelines: A complete evaluation includes B-mode imaging, spectral Doppler, color Doppler, and power Doppler as needed of all accessible portions of each vessel. Bilateral testing is considered an integral part of a complete examination. Limited examinations for reoccurring indications may be performed as noted. The reflux portion of  the exam is performed with the patient in reverse Trendelenburg.  +---------+---------------+---------+-----------+----------+-----------------+ RIGHT    CompressibilityPhasicitySpontaneityPropertiesThrombus Aging    +---------+---------------+---------+-----------+----------+-----------------+ CFV      Full           Yes      Yes                                    +---------+---------------+---------+-----------+----------+-----------------+ SFJ      Full                                                           +---------+---------------+---------+-----------+----------+-----------------+ FV Prox  Full                                                           +---------+---------------+---------+-----------+----------+-----------------+ FV Mid   Full                                                           +---------+---------------+---------+-----------+----------+-----------------+ FV DistalFull                                                           +---------+---------------+---------+-----------+----------+-----------------+ PFV      Full                                                           +---------+---------------+---------+-----------+----------+-----------------+ POP      Full           Yes      Yes                                    +---------+---------------+---------+-----------+----------+-----------------+  Joslin  Date of Exam:   05/20/2023 Medical Rec #: [id number]          Accession #:    [id number] Date of Birth: [birth year]          Patient Gender: M Patient Age:   7 years Exam Location:  [facility] Procedure:      VAS [place] LOWER EXTREMITY VENOUS (DVT) Referring Phys: [name] --------------------------------------------------------------------------------  Indications: Pre-VAD.  Risk Factors: None identified. Comparison Study: No prior studies. Performing Technologist: Chanda Busing RVT  Examination Guidelines: A complete evaluation includes B-mode imaging, spectral Doppler, color Doppler, and power Doppler as needed of all accessible portions of each vessel. Bilateral testing is considered an integral part of a complete examination. Limited examinations for reoccurring indications may be performed as noted. The reflux portion of  the exam is performed with the patient in reverse Trendelenburg.  +---------+---------------+---------+-----------+----------+-----------------+ RIGHT    CompressibilityPhasicitySpontaneityPropertiesThrombus Aging    +---------+---------------+---------+-----------+----------+-----------------+ CFV      Full           Yes      Yes                                    +---------+---------------+---------+-----------+----------+-----------------+ SFJ      Full                                                           +---------+---------------+---------+-----------+----------+-----------------+ FV Prox  Full                                                           +---------+---------------+---------+-----------+----------+-----------------+ FV Mid   Full                                                           +---------+---------------+---------+-----------+----------+-----------------+ FV DistalFull                                                           +---------+---------------+---------+-----------+----------+-----------------+ PFV      Full                                                           +---------+---------------+---------+-----------+----------+-----------------+ POP      Full           Yes      Yes                                    +---------+---------------+---------+-----------+----------+-----------------+  Joslin  Date of Exam:   05/20/2023 Medical Rec #: [id number]          Accession #:    [id number] Date of Birth: [birth year]          Patient Gender: M Patient Age:   7 years Exam Location:  [facility] Procedure:      VAS [place] LOWER EXTREMITY VENOUS (DVT) Referring Phys: [name] --------------------------------------------------------------------------------  Indications: Pre-VAD.  Risk Factors: None identified. Comparison Study: No prior studies. Performing Technologist: Chanda Busing RVT  Examination Guidelines: A complete evaluation includes B-mode imaging, spectral Doppler, color Doppler, and power Doppler as needed of all accessible portions of each vessel. Bilateral testing is considered an integral part of a complete examination. Limited examinations for reoccurring indications may be performed as noted. The reflux portion of  the exam is performed with the patient in reverse Trendelenburg.  +---------+---------------+---------+-----------+----------+-----------------+ RIGHT    CompressibilityPhasicitySpontaneityPropertiesThrombus Aging    +---------+---------------+---------+-----------+----------+-----------------+ CFV      Full           Yes      Yes                                    +---------+---------------+---------+-----------+----------+-----------------+ SFJ      Full                                                           +---------+---------------+---------+-----------+----------+-----------------+ FV Prox  Full                                                           +---------+---------------+---------+-----------+----------+-----------------+ FV Mid   Full                                                           +---------+---------------+---------+-----------+----------+-----------------+ FV DistalFull                                                           +---------+---------------+---------+-----------+----------+-----------------+ PFV      Full                                                           +---------+---------------+---------+-----------+----------+-----------------+ POP      Full           Yes      Yes                                    +---------+---------------+---------+-----------+----------+-----------------+

## 2023-05-21 NOTE — Progress Notes (Addendum)
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 Angioedema   Aspirin Other (See Comments)    Other Reaction: Other reaction   Indocin [Indomethacin] Other (See Comments)   Lactase-Lactobacillus Other (See Comments)   Lisinopril Other (See Comments)   Mobic [Meloxicam] Other (See Comments)   Naproxen Other (See Comments)   Nsaids Other (See Comments)    Other Reaction: Other reaction     Patient Measurements: Height: 5\' 8"  (172.7 cm) Weight: 74.8 kg (164 lb 14.5 oz) IBW/kg (Calculated) : 68.4  Vital Signs: Temp: 98.2 F (36.8 C) (10/03 0332) Temp Source: Axillary (10/03 0332) BP: 133/85 (10/03 0700) Pulse Rate: 72 (10/03 0700)  Labs: Recent Labs    05/19/23 0430 05/19/23 1647 05/19/23 2325 05/20/23 0012 05/20/23 0421 05/20/23 1639 05/20/23 2341 05/21/23 0321  HGB 10.5*  --  10.9*  --  10.9*  --   --  10.3*  HCT 31.4*  --  32.0*  --  32.3*  --   --  30.8*  PLT 128*  --   --   --  125*  --   --  120*  APTT  --    < >  --    < > 51* 40* 45* 48*  LABPROT 23.9*  --   --   --  19.3*  --   --  19.1*  INR 2.1*  --   --   --  1.6*  --   --  1.6*  CREATININE 2.42*  --   --   --  1.65*  --   --  1.62*   < > = values in this interval not displayed.    Estimated Creatinine Clearance: 38.1 mL/min (A) (by C-G formula based on SCr of 1.62 mg/dL (H)).   Assessment: 77yo male tx'd from [facility] for acute on chronic HF and initiation of milrinone, to transition from Eliquis for Afib (last dose 9/27 am) IV anticoagultion. 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 Argatroban was initially started for him with known AKI, but hepatic function tests came back  elevated indicating shock liver. Patient was initially started on bivalirudin as patient has AKI with baseline CKD. INR >10 on 9/29 - receive vitamin K mg PO. Off anticoagulation on 9/29, aPTT returned at 66 > 51. UE duplex finding no evidence of DVT but multiple anechoic areas. Discussed with MD - will start anticoagulation with plans for possible DCCV this week.  10/3 AM: aPTT 48, subtherapeutic on 0.025 mg/kg/hr. Per RN, no issue with infusion running or signs of bleeding overnight. CBC stable (Hgb 10.3, Plt 125). Scr stable at 1.62.   Goal of Therapy:  aPTT 50-80 seconds Monitor platelets by anticoagulation protocol: Yes   Plan:  Increase bivalirudin to 0.03 mg/kg/hr Check aPTT in 4 hours Daily aPTT, PT/INR, CBC  Addendum: Repeat aPTT 48 seconds, subtherapeutic on bivalirudin 0.03 mg/kg/hr. Increase bivalirudin to 0.035 mg/kg/hr and recheck aPTT in 4 hours.    Thank you for allowing pharmacy to participate in this patient's care,  Enos Fling, PharmD PGY-1 Acute Care Pharmacy Resident 05/21/2023 8:36 AM

## 2023-05-21 NOTE — Evaluation (Signed)
Clinical/Bedside Swallow Evaluation Patient Details  Name: [name] MRN: [id number] Date of Birth: 04-10-1946  Today's Date: 05/21/2023 Time: SLP Start Time (ACUTE ONLY): 1534 SLP Stop Time (ACUTE ONLY): 1544 SLP Time Calculation (min) (ACUTE ONLY): 10 min  Past Medical History:  Past Medical History:  Diagnosis Date   Abnormal chest CT    Anaphylactic reaction    Anxiety    Asthma    Benign hypertension    Cardiomyopathy    Cardiomyopathy, idiopathic (HCC)    CHF (congestive heart failure) (HCC)    Chronic renal insufficiency    With creatinine 1.4   Complication of anesthesia    Drug-induced urticaria    FH: thoracic aortic aneurysm    GERD (gastroesophageal reflux disease)    History of colon polyps    Hives    Long Hx of hives   Hyperlipidemia    Hyperlipidemia    IMPLANTATION OF DEFIBRILLATOR, HX OF 02/15/2009   Qualifier: Diagnosis of  By: Jens Som, MD, Lyn Hollingshead    NSVT (nonsustained ventricular tachycardia) (HCC)    Osteoarthritis    Osteoarthrosis, unspecified whether generalized or localized, lower leg    Osteoporosis    Oxygen deficiency    Psoriasis    PVC (premature ventricular contraction)    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   Past Surgical History:  Past Surgical History:  Procedure Laterality Date   APPENDECTOMY     BIV ICD GENERTAOR CHANGE OUT N/A 10/08/2012   Procedure: BIV ICD GENERTAOR CHANGE OUT;  Surgeon: Marinus Maw, MD;  Location: [facility] CATH LAB;  Service: Cardiovascular;  Laterality: N/A;   CARDIAC CATHETERIZATION     CARDIAC DEFIBRILLATOR REMOVAL  2014   CARDIOVERSION N/A 04/07/2023   Procedure: CARDIOVERSION;  Surgeon: Chilton Si, MD;  Location: [facility] INVASIVE CV LAB;  Service: Cardiovascular;  Laterality: N/A;   CATARACT EXTRACTION     CATARACT EXTRACTION W/PHACO Left 03/12/2016   Procedure: CATARACT EXTRACTION PHACO AND INTRAOCULAR LENS PLACEMENT (IOC);  Surgeon: Sallee Lange, MD;  Location: [facility]  ORS;  Service: Ophthalmology;  Laterality: Left;  [place] 01:32 AP% 25.7 CDE 41.33 fluid pack lot # [id number]   COLONOSCOPY WITH PROPOFOL N/A 04/26/2019   Procedure: COLONOSCOPY WITH PROPOFOL;  Surgeon: Christena Deem, MD;  Location: [facility] ENDOSCOPY;  Service: Endoscopy;  Laterality: N/A;   ESOPHAGOGASTRODUODENOSCOPY     EYE SURGERY     Implantation of dual-chamber ICD.     [employer] Jude DDD-ICD 1/07   JOINT REPLACEMENT Right 08/16/2009   KNEE ARTHROPLASTY Left 2006   KNEE ARTHROPLASTY Right 12/10   x2   LAPAROSCOPIC APPENDECTOMY N/A 08/12/2018   Procedure: APPENDECTOMY LAPAROSCOPIC;  Surgeon: Henrene Dodge, MD;  Location: [facility] ORS;  Service: General;  Laterality: N/A;   TONSILLECTOMY     HPI:  Pt is a 77 yo male presenting to ED 9/26 with a three week history of progressively worsening weakness and dyspnea with exertion. Per note, he has an extensive cardiac history including unstable A-fib with RVR (treated with cardioversion and planned ablations). Undergoing evaluation for LVAD placement. PMH includes paroxysmal A-fib, cardiomyopathy, CHF, GERD    Assessment / Plan / Recommendation  Clinical Impression  Pt reports no prior difficulty with swallowing. Pt and his RN report pt has a dry cough frequently throughout the day that does not seem directly correlated with swallowing. Oral motor exam WFL. Trials of thin liquids and solids observed without signs clincally concerning for dysphagia or aspiration. Recommend continuing current diet.  No further f/u is needed. SLP to s/o at this time. SLP Visit Diagnosis: Dysphagia, unspecified (R13.10)    Aspiration Risk  Mild aspiration risk    Diet Recommendation Regular;Thin liquid    Liquid Administration via: Cup;Straw Medication Administration: Whole meds with liquid Supervision: Patient able to self feed Compensations: Slow rate;Small sips/bites Postural Changes: Seated upright at 90 degrees    Other  Recommendations Oral Care Recommendations:  Oral care BID    Recommendations for follow up therapy are one component of a multi-disciplinary discharge planning process, led by the attending physician.  Recommendations may be updated based on patient status, additional functional criteria and insurance authorization.  Follow up Recommendations No SLP follow up      Assistance Recommended at Discharge    Functional Status Assessment Patient has not had a recent decline in their functional status  Frequency and Duration            Prognosis Prognosis for improved oropharyngeal function: Good      Swallow Study   General HPI: Pt is a 77 yo male presenting to ED 9/26 with a three week history of progressively worsening weakness and dyspnea with exertion. Per note, he has an extensive cardiac history including unstable A-fib with RVR (treated with cardioversion and planned ablations). Undergoing evaluation for LVAD placement. PMH includes paroxysmal A-fib, cardiomyopathy, CHF, GERD Type of Study: Bedside Swallow Evaluation Previous Swallow Assessment: none in chart Diet Prior to this Study: Regular;Thin liquids (Level 0) Temperature Spikes Noted: No Respiratory Status: Nasal cannula History of Recent Intubation: No Behavior/Cognition: Alert;Cooperative;Pleasant mood Oral Cavity Assessment: Dry Oral Care Completed by SLP: No Oral Cavity - Dentition: Adequate natural dentition Vision: Functional for self-feeding Self-Feeding Abilities: Able to feed self Patient Positioning: Upright in bed Baseline Vocal Quality: Normal Volitional Cough: Strong Volitional Swallow: Able to elicit    Oral/Motor/Sensory Function Overall Oral Motor/Sensory Function: Within functional limits   Ice Chips Ice chips: Not tested   Thin Liquid Thin Liquid: Within functional limits Presentation: Straw;Self Fed    Nectar Thick Nectar Thick Liquid: Not tested   Honey Thick Honey Thick Liquid: Not tested   Puree Puree: Not tested   Solid     Solid:  Within functional limits Presentation: Self Fed      Gwynneth Aliment, M.A., CF-SLP Speech Language Pathology, Acute Rehabilitation Services  Secure Chat preferred [phone]  05/21/2023,3:52 PM

## 2023-05-21 NOTE — Plan of Care (Signed)
  Problem: Education: Goal: Understanding of CV disease, CV risk reduction, and recovery process will improve Outcome: Progressing   Problem: Activity: Goal: Ability to return to baseline activity level will improve Outcome: Progressing   Problem: Cardiovascular: Goal: Ability to achieve and maintain adequate cardiovascular perfusion will improve Outcome: Progressing Goal: Vascular access site(s) Level 0-1 will be maintained Outcome: Progressing   Problem: Health Behavior/Discharge Planning: Goal: Ability to safely manage health-related needs after discharge will improve Outcome: Progressing   Problem: Education: Goal: Knowledge of General Education information will improve Description: Including pain rating scale, medication(s)/side effects and non-pharmacologic comfort measures Outcome: Progressing   Problem: Health Behavior/Discharge Planning: Goal: Ability to manage health-related needs will improve Outcome: Progressing   Problem: Clinical Measurements: Goal: Ability to maintain clinical measurements within normal limits will improve Outcome: Progressing Goal: Will remain free from infection Outcome: Progressing Goal: Diagnostic test results will improve Outcome: Progressing Goal: Respiratory complications will improve Outcome: Progressing Goal: Cardiovascular complication will be avoided Outcome: Progressing   Problem: Activity: Goal: Risk for activity intolerance will decrease Outcome: Progressing   Problem: Nutrition: Goal: Adequate nutrition will be maintained Outcome: Progressing   Problem: Coping: Goal: Level of anxiety will decrease Outcome: Progressing   Problem: Elimination: Goal: Will not experience complications related to bowel motility Outcome: Progressing Goal: Will not experience complications related to urinary retention Outcome: Progressing   Problem: Pain Managment: Goal: General experience of comfort will improve Outcome: Progressing    Problem: Safety: Goal: Ability to remain free from injury will improve Outcome: Progressing   Problem: Education: Goal: Ability to describe self-care measures that may prevent or decrease complications (Diabetes Survival Skills Education) will improve Outcome: Progressing   Problem: Fluid Volume: Goal: Ability to maintain a balanced intake and output will improve Outcome: Progressing   Problem: Metabolic: Goal: Ability to maintain appropriate glucose levels will improve Outcome: Progressing   Problem: Nutritional: Goal: Maintenance of adequate nutrition will improve Outcome: Progressing Goal: Progress toward achieving an optimal weight will improve Outcome: Progressing

## 2023-05-21 NOTE — Progress Notes (Signed)
  Subjective: Patient with improved appetite and ate a good breakfast After being helped to the chair with physical therapy he had significant back pain and wanted to get back to bed after 1 hour. Maintaining sinus rhythm now off inotropes Coox and CVP are satisfactory this a.m. Creatinine steady at 1.7 Alpha -gal IgE antibody titer has been sent off Objective: Vital signs in last 24 hours: Temp:  [97.7 F (36.5 C)-98.7 F (37.1 C)] 98.3 F (36.8 C) (10/03 1138) Pulse Rate:  [61-75] 75 (10/03 1200) Cardiac Rhythm: Normal sinus rhythm (10/03 0800) Resp:  [16-25] 20 (10/03 1200) BP: (90-142)/(43-86) 113/52 (10/03 1100) SpO2:  [94 %-100 %] 97 % (10/03 1200) Weight:  [74.8 kg] 74.8 kg (10/03 0500)  Hemodynamic parameters for last 24 hours: CVP:  [0 mmHg-13 mmHg] 3 mmHg  Intake/Output from previous day: 10/02 0701 - 10/03 0700 In: 188.8 [I.V.:188.8] Out: 800 [Urine:800] Intake/Output this shift: Total I/O In: 32.6 [I.V.:32.6] Out: 200 [Urine:200]  Exam Somewhat depressed Breathing comfortably Sinus rhythm  Lab Results: Recent Labs    05/20/23 0421 05/21/23 0321  WBC 10.4 8.4  HGB 10.9* 10.3*  HCT 32.3* 30.8*  PLT 125* 120*   BMET:  Recent Labs    05/20/23 0421 05/21/23 0321  NA 130* 131*  K 4.3 3.7  CL 95* 100  CO2 24 25  GLUCOSE 199* 122*  BUN 38* 36*  CREATININE 1.65* 1.62*  CALCIUM 7.6* 7.6*    PT/INR:  Recent Labs    05/21/23 0321  LABPROT 19.1*  INR 1.6*   ABG    Component Value Date/Time   PHART 7.471 (H) 05/19/2023 2325   HCO3 22.8 05/19/2023 2325   TCO2 24 05/19/2023 2325   ACIDBASEDEF 7.3 (H) 05/14/2023 1343   O2SAT 69.3 05/21/2023 0319   CBG (last 3)  Recent Labs    05/20/23 1610 05/20/23 2103 05/21/23 0629  GLUCAP 126* 122* 134*    Assessment/Plan: S/P  77 year old gentleman with nonischemic cardiomyopathy, deconditioning, severe protein malnutrition, longstanding DJD of the back and alpha-gal autoimmune disease being  treated for cardiogenic shock on admission.VAD evaluation in progress. Clinically improved in sinus rhythm. Encourage plant-based protein intake  LOS: 6 days    [name] 05/21/2023

## 2023-05-22 ENCOUNTER — Encounter: Payer: Medicare Other | Admitting: Family

## 2023-05-22 ENCOUNTER — Other Ambulatory Visit: Payer: Self-pay

## 2023-05-22 ENCOUNTER — Encounter (HOSPITAL_COMMUNITY): Payer: Self-pay | Admitting: Cardiology

## 2023-05-22 DIAGNOSIS — E43 Unspecified severe protein-calorie malnutrition: Secondary | ICD-10-CM

## 2023-05-22 DIAGNOSIS — Z515 Encounter for palliative care: Secondary | ICD-10-CM

## 2023-05-22 DIAGNOSIS — I5021 Acute systolic (congestive) heart failure: Secondary | ICD-10-CM | POA: Diagnosis not present

## 2023-05-22 LAB — CBC
HCT: 32 % — ABNORMAL LOW (ref 39.0–52.0)
Hemoglobin: 10.5 g/dL — ABNORMAL LOW (ref 13.0–17.0)
MCH: 31.3 pg (ref 26.0–34.0)
MCHC: 32.8 g/dL (ref 30.0–36.0)
MCV: 95.2 fL (ref 80.0–100.0)
Platelets: 125 10*3/uL — ABNORMAL LOW (ref 150–400)
RBC: 3.36 MIL/uL — ABNORMAL LOW (ref 4.22–5.81)
RDW: 14.1 % (ref 11.5–15.5)
WBC: 7 10*3/uL (ref 4.0–10.5)
nRBC: 0 % (ref 0.0–0.2)

## 2023-05-22 LAB — BASIC METABOLIC PANEL
Anion gap: 9 (ref 5–15)
BUN: 33 mg/dL — ABNORMAL HIGH (ref 8–23)
CO2: 23 mmol/L (ref 22–32)
Calcium: 7.7 mg/dL — ABNORMAL LOW (ref 8.9–10.3)
Chloride: 99 mmol/L (ref 98–111)
Creatinine, Ser: 1.44 mg/dL — ABNORMAL HIGH (ref 0.61–1.24)
GFR, Estimated: 51 mL/min — ABNORMAL LOW (ref >60)
Glucose, Bld: 100 mg/dL — ABNORMAL HIGH (ref 70–99)
Potassium: 4.3 mmol/L (ref 3.5–5.1)
Sodium: 131 mmol/L — ABNORMAL LOW (ref 135–145)

## 2023-05-22 LAB — COOXEMETRY PANEL
Carboxyhemoglobin: 2.8 % — ABNORMAL HIGH (ref 0.5–1.5)
Methemoglobin: <0.7 % (ref 0.0–1.5)
O2 Saturation: 72.5 %
Total hemoglobin: 11 g/dL — ABNORMAL LOW (ref 12.0–16.0)

## 2023-05-22 LAB — TROPONIN I (HIGH SENSITIVITY): Troponin I (High Sensitivity): 22 ng/L — ABNORMAL HIGH (ref <18)

## 2023-05-22 LAB — PROTIME-INR
INR: 1.5 — ABNORMAL HIGH (ref 0.8–1.2)
Prothrombin Time: 18.2 s — ABNORMAL HIGH (ref 11.4–15.2)

## 2023-05-22 LAB — APTT
aPTT: 41 s — ABNORMAL HIGH (ref 24–36)
aPTT: 39 s — ABNORMAL HIGH (ref 24–36)

## 2023-05-22 LAB — MAGNESIUM: Magnesium: 2.1 mg/dL (ref 1.7–2.4)

## 2023-05-22 MED ORDER — EMPAGLIFLOZIN 10 MG PO TABS
10.0000 mg | ORAL_TABLET | Freq: Every day | ORAL | Status: DC
  Administered 2023-05-23 – 2023-05-24 (×2): 10 mg via ORAL
  Filled 2023-05-22 (×2): qty 1

## 2023-05-22 MED ORDER — DAPAGLIFLOZIN PROPANEDIOL 10 MG PO TABS
10.0000 mg | ORAL_TABLET | Freq: Every day | ORAL | Status: DC
  Administered 2023-05-22: 10 mg via ORAL
  Filled 2023-05-22: qty 1

## 2023-05-22 MED ORDER — EMPAGLIFLOZIN 10 MG PO TABS: 10.0000 mg | ORAL_TABLET | Freq: Every day | ORAL | Status: DC

## 2023-05-22 MED ORDER — SPIRONOLACTONE 12.5 MG HALF TABLET
12.5000 mg | ORAL_TABLET | Freq: Every day | ORAL | Status: DC
  Administered 2023-05-22 – 2023-05-23 (×2): 12.5 mg via ORAL
  Filled 2023-05-22 (×2): qty 1

## 2023-05-22 MED ORDER — APIXABAN 5 MG PO TABS
5.0000 mg | ORAL_TABLET | Freq: Two times a day (BID) | ORAL | Status: DC
  Administered 2023-05-22 – 2023-05-24 (×5): 5 mg via ORAL
  Filled 2023-05-22 (×5): qty 1

## 2023-05-22 MED ORDER — ALPRAZOLAM 0.25 MG PO TABS: 0.2500 mg | ORAL_TABLET | Freq: Three times a day (TID) | ORAL | Status: DC | PRN

## 2023-05-22 MED ORDER — POLYETHYLENE GLYCOL 3350 17 G PO PACK: 17.0000 g | PACK | Freq: Every day | ORAL | Status: DC | PRN

## 2023-05-22 MED ORDER — LACTATED RINGERS IV BOLUS
250.0000 mL | Freq: Once | INTRAVENOUS | Status: AC
  Administered 2023-05-22: 250 mL via INTRAVENOUS

## 2023-05-22 MED ORDER — SENNA 8.6 MG PO TABS: 1.0000 | ORAL_TABLET | Freq: Every evening | ORAL | Status: DC | PRN

## 2023-05-22 NOTE — Progress Notes (Signed)
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 Angioedema   Aspirin Other (See Comments)    Other Reaction: Other reaction   Indocin [Indomethacin] Other (See Comments)   Lactase-Lactobacillus Other (See Comments)   Lisinopril Other (See Comments)   Mobic [Meloxicam] Other (See Comments)   Naproxen Other (See Comments)   Nsaids Other (See Comments)    Other Reaction: Other reaction     Patient Measurements: Height: 5\' 8"  (172.7 cm) Weight: 74.4 kg (164 lb 0.4 oz) IBW/kg (Calculated) : 68.4  Vital Signs: Temp: 98.7 F (37.1 C) (10/04 0400) Temp Source: Oral (10/04 0400) BP: 125/50 (10/04 0400) Pulse Rate: 71 (10/04 0400)  Labs: Recent Labs    05/20/23 0421 05/20/23 1639 05/21/23 0321 05/21/23 1442 05/21/23 1839 05/21/23 2205 05/22/23 0450  HGB 10.9*  --  10.3*  --   --   --  10.5*  HCT 32.3*  --  30.8*  --   --   --  32.0*  PLT 125*  --  120*  --   --   --  125*  APTT 51*   < > 48*   < > 44* 40* 41*  LABPROT 19.3*  --  19.1*  --   --   --  18.2*  INR 1.6*  --  1.6*  --   --   --  1.5*  CREATININE 1.65*  --  1.62*  --   --   --  1.44*   < > = values in this interval not displayed.    Estimated Creatinine Clearance: 42.9 mL/min (A) (by C-G formula based on SCr of 1.44 mg/dL (H)).   Assessment: 77yo male tx'd from [facility] for acute on chronic HF and initiation of milrinone, to transition from Eliquis for Afib (last dose 9/27 am) IV anticoagultion. 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 Argatroban was initially started for him with known AKI, but hepatic function tests came back elevated indicating shock liver. Patient was initially started  on bivalirudin as patient has AKI with baseline CKD. INR >10 on 9/29 - receive vitamin K mg PO. Off anticoagulation on 9/29, aPTT returned at 66 > 51. UE duplex finding no evidence of DVT but multiple anechoic areas. Discussed with MD - will start anticoagulation with plans for possible DCCV this week.  10/3 0321: aPTT 48, subtherapeutic on 0.025 mg/kg/hr. Per RN, no issue with infusion running or signs of bleeding overnight. CBC stable (Hgb 10.3, Plt 125). Scr stable at 1.62.   10/3 1442: Repeat aPTT 48 seconds, subtherapeutic on bivalirudin 0.03 mg/kg/hr. Increase bivalirudin to 0.035 mg/kg/hr and recheck aPTT in 4 hours.   10/3 2205: aPTT 40, subtherapeutic on bivalirudin 0.035 mg/kg/hr. No issues with the infusion or bleeding reported per RN.   10/4 AM update:  aPTT remains sub-therapeutic   Goal of Therapy:  aPTT 50-80 seconds Monitor platelets by anticoagulation protocol: Yes   Plan:  Increase bivalirudin to 0.05 mg/kg/hr Check aPTT in 4 hours 0500/1700 aPTT, PT/INR, CBC  Abran Duke, PharmD, BCPS Clinical Pharmacist Phone: [phone]

## 2023-05-22 NOTE — Progress Notes (Signed)
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 Angioedema   Aspirin Other (See Comments)    Other Reaction: Other reaction   Indocin [Indomethacin] Other (See Comments)   Lactase-Lactobacillus Other (See Comments)   Lisinopril Other (See Comments)   Mobic [Meloxicam] Other (See Comments)   Naproxen Other (See Comments)   Nsaids Other (See Comments)    Other Reaction: Other reaction     Patient Measurements: Height: 5\' 8"  (172.7 cm) Weight: 74.4 kg (164 lb 0.4 oz) IBW/kg (Calculated) : 68.4  Vital Signs: Temp: 98.1 F (36.7 C) (10/04 0700) Temp Source: Oral (10/04 0700) BP: 108/64 (10/04 0700) Pulse Rate: 72 (10/04 0700)  Labs: Recent Labs    05/20/23 0421 05/20/23 1639 05/21/23 0321 05/21/23 1442 05/21/23 2205 05/22/23 0450 05/22/23 0959  HGB 10.9*  --  10.3*  --   --  10.5*  --   HCT 32.3*  --  30.8*  --   --  32.0*  --   PLT 125*  --  120*  --   --  125*  --   APTT 51*   < > 48*   < > 40* 41* 39*  LABPROT 19.3*  --  19.1*  --   --  18.2*  --   INR 1.6*  --  1.6*  --   --  1.5*  --   CREATININE 1.65*  --  1.62*  --   --  1.44*  --    < > = values in this interval not displayed.    Estimated Creatinine Clearance: 42.9 mL/min (A) (by C-G formula based on SCr of 1.44 mg/dL (H)).   Assessment: 77yo male tx'd from [facility] for acute on chronic HF and initiation of milrinone, to transition from Eliquis for Afib (last dose 9/27 am) IV anticoagultion. 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 Argatroban was initially started for him with known AKI, but hepatic function tests came back elevated indicating shock liver. Patient was initially started  on bivalirudin as patient has AKI with baseline CKD. INR >10 on 9/29 - receive vitamin K mg PO. Off anticoagulation on 9/29, aPTT returned at 66 > 51. UE duplex finding no evidence of DVT but multiple anechoic areas. Discussed with MD - will start anticoagulation with plans for possible DCCV this week.  10/4 1200: No future plans to for surgical interventions this admission, will transition patient to DOAC per MD. Patient was on Eliquis PTA for Afib. CBC remains stable (Hgb 10.5, Plt 125).   Goal of Therapy:  Monitor platelets by anticoagulation protocol: Yes   Plan:  Discontinue bivalirudin Start Eliquis 5 mg PO BID  Enos Fling, PharmD PGY-1 Acute Care Pharmacy Resident 05/22/2023 12:57 PM

## 2023-05-22 NOTE — Evaluation (Signed)
Occupational Therapy Evaluation and Discharge Patient Details Name: [name] MRN: [id number] DOB: [birth year] Today's Date: 05/22/2023   History of Present Illness Pt is a 77 yo male presenting to ED 9/26 with a three week history of progressively worsening weakness and dyspnea with exertion. Per note, he has an extensive cardiac history including unstable A-fib with RVR (treated with cardioversion and planned ablations). Undergoing evaluation for LVAD placement. PMH includes paroxysmal A-fib, cardiomyopathy, CHF, GERD   Clinical Impression   This 77 yo male admitted with above presents to acute OT ambulating well with PT and has a shower seat to sit on from a safety and energy conservation standpoint as well as 24/7 care from wife and other family. All education as well as energy conservation handout provided, we will D/C from acute OT.       If plan is discharge home, recommend the following: A little help with bathing/dressing/bathroom;A little help with walking and/or transfers;Assistance with cooking/housework;Help with stairs or ramp for entrance;Assist for transportation    Functional Status Assessment  Patient has had a recent decline in their functional status and demonstrates the ability to make significant improvements in function in a reasonable and predictable amount of time. (without futher need for skilled OT services, all education completed.)  Equipment Recommendations  Other (comment) (rollator)       Precautions / Restrictions Precautions Precautions: Fall Restrictions Weight Bearing Restrictions: No             ADL either performed or assessed with clinical judgement   ADL                                         General ADL Comments: Educated pt and family on energy conservation stratgies for home (ie: sitting on shower seat to shower and using hand held shower head, breaking up ADLs (shower, use robe to dry off, rest, then get  dressed), purse lipped breathing and gave them energy conservation handout. Also spoke with them about how a rollator could help.     Vision Patient Visual Report: No change from baseline              Pertinent Vitals/Pain Pain Assessment Pain Assessment: No/denies pain     Extremity/Trunk Assessment Upper Extremity Assessment Upper Extremity Assessment: Overall WFL for tasks assessed           Communication Communication Communication: No apparent difficulties   Cognition Arousal: Alert   Overall Cognitive Status: Within Functional Limits for tasks assessed                                                  Home Living Family/patient expects to be discharged to:: Private residence Living Arrangements: Spouse/significant other Available Help at Discharge: Family;Available 24 hours/day Type of Home: House Home Access: Stairs to enter Entrance Stairs-Number of Steps: 2/8   Home Layout: Two level;Full bath on main level;Able to live on main level with bedroom/bathroom;Bed/bath upstairs   Alternate Level Stairs-Rails: Right;Left Bathroom Shower/Tub: Chief Strategy Officer: Standard     Home Equipment: Shower seat          Prior Functioning/Environment Prior Level of Function : Independent/Modified Independent;Driving  ADLs Comments: Independent with ADL's        OT Problem List: Cardiopulmonary status limiting activity;Decreased activity tolerance         OT Goals(Current goals can be found in the care plan section) Acute Rehab OT Goals Patient Stated Goal: to hopefully go home tomorrow         AM-PAC OT "6 Clicks" Daily Activity     Outcome Measure Help from another person eating meals?: None Help from another person taking care of personal grooming?: A Little Help from another person toileting, which includes using toliet, bedpan, or urinal?: A Little Help from another person bathing (including  washing, rinsing, drying)?: A Little Help from another person to put on and taking off regular upper body clothing?: A Little Help from another person to put on and taking off regular lower body clothing?: A Little 6 Click Score: 19   End of Session    Activity Tolerance: Patient tolerated treatment well Patient left: in bed  OT Visit Diagnosis: Muscle weakness (generalized) (M62.81)                Time: 1349-1411 OT Time Calculation (min): 22 min Charges:  OT General Charges $OT Visit: 1 Visit OT Evaluation $OT Eval Moderate Complexity: 1 Mod  Cathy L. OT Acute Rehabilitation Services Office [phone]    Evette Georges 05/22/2023, 4:05 PM

## 2023-05-22 NOTE — Progress Notes (Signed)
    Subjective    CTSP by RN.  Pt reports a 5 wk h/o intermittent left sided chest pressure, typically occurring under the L rib cage.  He fell ~ 5 wks ago and has had symptoms ever since.  He describes it as a mild pressure-like sensation as though something is filling his lower chest/upper abdomen, somewhat limiting his ability to take a deep breath.  He denies pain, tenderness, or change in symptom w/ deep breathing, change in position, or coughing.  He is currently in no distress.  He usually takes acetaminophen when this occurs @ home.  Objective    Vitals:   05/22/23 1559 05/22/23 1602  BP: (!) 136/55 (!) 136/55  Pulse: 81 80  Resp: (!) 21 (!) 23  Temp: 98.3 F (36.8 C)   SpO2: 97% 100%   Pleasant, NAD, AAOx3.  Lungs CTA. Cor RRR, no murmurs. Nontender across the L rib cage w/o palpable masses or deformity.  Abd soft, nt/nd/bs+x4. Ext no edema.  ECG: RSR, 92, 1st deg AVB, IVCD  Lab Results  Component Value Date   WBC 7.0 05/22/2023   HGB 10.5 (L) 05/22/2023   HCT 32.0 (L) 05/22/2023   MCV 95.2 05/22/2023   PLT 125 (L) 05/22/2023    Lab Results  Component Value Date   CREATININE 1.44 (H) 05/22/2023   BUN 33 (H) 05/22/2023   NA 131 (L) 05/22/2023   K 4.3 05/22/2023   CL 99 05/22/2023   CO2 23 05/22/2023   Recent Labs  Lab 05/14/23 0946 05/14/23 1201 05/14/23 1742 05/14/23 1932 05/14/23 2123  TROPONINIHS 44* 46* 71* 53* 82*     BNP    Component Value Date/Time   BNP 4,176.1 (H) 05/14/2023 0946   BNP 86.3 09/18/2014 1115    ProBNP    Component Value Date/Time   PROBNP 195.0 (H) 08/29/2013 0856   Assessment & Plan    1.  L lower chest/upper abdominal pressure:  Intermittent symptoms ever since falling ~ 5 wks ago.  Described as pressure like sensation - 1/10.  No change w/ palpation, deep breathing, cough, or repositioning.  Usually takes acetaminophen when this occurs @ home.  VSS.  Ecg w/o acute ST/T changes.  Suspect msk pain.  Will f/u hsTrop.  Prn  analgesia w/ acetaminophen.  Nicolasa Ducking, NP 05/22/2023, 7:59 PM

## 2023-05-22 NOTE — Progress Notes (Signed)
Advanced Heart Failure Rounding Note  PCP-Cardiologist: Olga Millers, MD   Subjective:    Transferred to PCU. SBP overnight remained soft. Self resolving MAP of 59 with SBP >90 overnight.   CO-OX 72.5% CVP 3-4  Pt reports decent sleep overnight night. Ready to get up to the chair.   Objective:   Weight Range: 74.4 kg Body mass index is 24.94 kg/m.   Vital Signs:   Temp:  [97.7 F (36.5 C)-98.7 F (37.1 C)] 98.7 F (37.1 C) (10/04 0400) Pulse Rate:  [63-80] 71 (10/04 0400) Resp:  [19-23] 20 (10/04 0400) BP: (107-133)/(46-85) 125/50 (10/04 0400) SpO2:  [95 %-100 %] 99 % (10/04 0400) Weight:  [74.4 kg-77.9 kg] 74.4 kg (10/04 0446) Last BM Date : 05/21/23  Weight change: Filed Weights   05/21/23 0500 05/21/23 1805 05/22/23 0446  Weight: 74.8 kg 77.9 kg 74.4 kg    Intake/Output:   Intake/Output Summary (Last 24 hours) at 05/22/2023 0659 Last data filed at 05/22/2023 0617 Gross per 24 hour  Intake 715.37 ml  Output 825 ml  Net -109.63 ml      Physical Exam    CVP 3-4 General:  Well appearing. On 2L [place]. HEENT: normal Neck: supple. JVD 6 cm.  Carotids 2+ bilat; no bruits. No lymphadenopathy or thryomegaly appreciated. Cor: PMI nondisplaced. Regular rate & rhythm. No rubs, gallops or murmurs. Lungs: Mild expiratory wheeze in lung bases Abdomen: soft, nontender, nondistended. No hepatosplenomegaly. No bruits or masses. Good bowel sounds. Extremities: no cyanosis, clubbing, rash, edema Neuro: alert & orientedx3, cranial nerves grossly intact. moves all 4 extremities w/o difficulty. Affect pleasant  Telemetry   SR 70s w 1 deg AV block  Labs    CBC Recent Labs    05/21/23 0321 05/22/23 0450  WBC 8.4 7.0  HGB 10.3* 10.5*  HCT 30.8* 32.0*  MCV 94.8 95.2  PLT 120* 125*   Basic Metabolic Panel Recent Labs    16/10/96 0421 05/21/23 0321 05/22/23 0450  NA 130* 131* 131*  K 4.3 3.7 4.3  CL 95* 100 99  CO2 24 25 23   GLUCOSE 199* 122* 100*  BUN  38* 36* 33*  CREATININE 1.65* 1.62* 1.44*  CALCIUM 7.6* 7.6* 7.7*  MG 2.0  --   --    Liver Function Tests No results for input(s): "AST", "ALT", "ALKPHOS", "BILITOT", "PROT", "ALBUMIN" in the last 72 hours.  No results for input(s): "LIPASE", "AMYLASE" in the last 72 hours. Cardiac Enzymes No results for input(s): "CKTOTAL", "CKMB", "CKMBINDEX", "TROPONINI" in the last 72 hours.  BNP: BNP (last 3 results) Recent Labs    05/14/23 0946  BNP 4,176.1*    ProBNP (last 3 results) No results for input(s): "PROBNP" in the last 8760 hours.   D-Dimer No results for input(s): "DDIMER" in the last 72 hours. Hemoglobin A1C No results for input(s): "HGBA1C" in the last 72 hours.  Fasting Lipid Panel No results for input(s): "CHOL", "HDL", "LDLCALC", "TRIG", "CHOLHDL", "LDLDIRECT" in the last 72 hours. Thyroid Function Tests No results for input(s): "TSH", "T4TOTAL", "T3FREE", "THYROIDAB" in the last 72 hours.  Invalid input(s): "FREET3"  Other results:   Imaging    No results found.   Medications:     Scheduled Medications:  amiodarone  200 mg Oral BID   Chlorhexidine Gluconate Cloth  6 each Topical Daily   ciprofloxacin  2 drop Right Eye Q4H while awake   digoxin  0.125 mg Oral Daily   doxazosin  1 mg Oral Daily  feeding supplement  237 mL Oral BID BM   sodium chloride flush  10-40 mL Intracatheter Q12H   sodium chloride flush  3 mL Intravenous Q12H    Infusions:  sodium chloride 5 mL/hr at 05/22/23 0617   bivalirudin (ANGIOMAX) 250 mg in sodium chloride 0.9 % 500 mL (0.5 mg/mL) infusion 0.0499 mg/kg/hr (05/22/23 0617)    PRN Medications: sodium chloride, acetaminophen, Gerhardt's butt cream, loperamide, naphazoline-glycerin, ondansetron (ZOFRAN) IV, mouth rinse, polyethylene glycol, [START ON 05/23/2023] senna, sodium chloride, sodium chloride flush, sodium chloride flush    Patient Profile   77 y.o. 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  Over the past several months he has had significant difficulty maintaining sinus rhythm. He underwent elective cardioversion in August 2024 with improvement in functional status for 2 to 3 weeks before he started having progressive fatigue and exertional dyspnea.  Most recently he was seen in cardiology clinic where he was back in atrial flutter.  Over the next several days he became rapidly volume overloaded and presented to [facility] emergency department in cardiogenic shock with a lactic acid of 4.7 and LFTs rising quickly to the thousands.  He was started on milrinone 0.125 mcg/kg/min and transferred to [facility].   Assessment/Plan   SCAI C cardiogenic shock 2/2 nonischemic cardiomyopathy -Heart failure dating back to 2004; diagnosed at [facility] at that time EF of 25% with normal coronary arteries.  Endomyocardial biopsy was also negative.  Felt to be secondary to ventricular ectopy and was subsequently started on amiodarone. -EF of 35 to 40% in 2019-2021, however, since that time it has been 25 to 30% persistently. -Remained fairly well compensated over the past several months until difficulties with atrial tachyarrhythmias leading to current decompensation.  -Initial lab work concerning for cardiogenic shock with lactic acid elevated to 4.7, LFTs up to 6300/2500 with serum creatinine up to 4.5.  -Echo 05/18/23: EF < 20%, RV mildly reduced -CO-OX stable at 72.5% with Fick CI > 3 off milrinone. -CVP 3-4 Continue to hold diuresis. -Continue digoxin 0.125 mg daily -No beta blocker with recent shock -GDMT difficult to initiate given intermittent hypotension requiring NE.  -Consider adding Cleda Daub low dose once blood pressure allows.  -Dose of Losartan given IP, subsequent hypotension. -VAD workup initiated. Malnutrition and alpha gal may be  biggest barriers to LVAD implant. Seen by TCTS. Would be high risk for bleeding with bivalirudin for anticoagulation. May need referral to [facility] for consideration of high-risk VAD implant.   2.  Atrial fibrillation/atrial flutter - Echocardiogram in March 2024 with a EF of 25 to 30%.  Diagnosed with new onset atrial fibrillation in July 2024.  Underwent elective cardioversion in April 07, 2023 with some improvement afterwards. -Presented to cardiology clinic on 05/12/2023; at that time noted to be in atrial flutter  - Seen by EP. Plan for consideration of ablation as outpatient - S/p TEE/DCCV on 10/01. Maintaining SR this am.  - Continue po amiodarone 200 mg BID - Anticoagulated with bival d/t alpha gal, remains sub therapeutic, titration management per PharmD  3. AKI on CKD - sCr improving; 1.44 today - Continue to follow - Continue to hold diuresis as above   4. Acute liver injury - AST/ALT on admission 235/138 with rise to 4500/2100 within hours. LFTs up to 6500 . LFTs improving significantly - Does not fit with amiodarone induced toxicity.  - INR > 10  on admit, now 1.5 - bleeding from IV sites improved   5. 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 Left upper extremity swelling - Venous dopplers negative for DVT - Possible amiodarone extravasation  7. Nutrition - Prealbumin is 6.  - Encouraged PO intake. Drinking ensure shakes and appetite improving.  - Dietician consulted  8. Urinary retention - Hold Cardura and reassess need  9. Constipation - Escalate bowel regimen  Transfer out of ICU today  Length of Stay: 7  Alan Christeena Krogh, NP  05/22/2023, 6:59 AM  Advanced Heart Failure Team Pager [phone] (M-F; 7a - 5p)  Please contact  [facility] for night-coverage after hours (5p -7a ) and weekends on [email or web address]

## 2023-05-22 NOTE — Progress Notes (Signed)
Daily Progress Note   Patient Name: [name]       Date: 05/22/2023 DOB: 13-May-1946  Age: 77 y.o. MRN#: [id number] Attending Physician: Laurey Morale, MD;Sab* Primary Care Physician: [name], MD Admit Date: 05/15/2023 Length of Stay: 7 days  Reason for Consultation/Follow-up:  LVAD Consult  HPI/Patient Profile:  77 y.o. 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  It appears he has had a significant decline since A-fib diagnosis in July 2024 and subsequent difficulty maintaining sinus rhythm.  Presented to [facility] ER 9/26 with history of weakness and progressive dyspnea.  He was admitted on 05/15/2023 with cardiogenic shock and multisystem organ dysfunction related to cardiogenic shock.    He has since begun evaluation for possible LVAD.  Palliative medicine was requested to consult due to possible LVAD consideration.  Subjective:   Subjective: Chart Reviewed. Updates received. Patient Assessed. Created space and opportunity for patient  and family to explore thoughts and feelings regarding current medical situation.  Today's Discussion: Today saw the patient at the bedside, no family was present.  He denies any pain or dyspnea.  He states he was up in the chair for a while this morning but just got back in the bed.  He states there is been no decision yet from the team on whether he will get an LVAD.  He understands the risks of LVAD placement and the difficult recovery typically entails.  He and his family are committed to getting an LVAD if it is offered.  I encouraged him to continue to get out of bed is much as possible and increase his activity in order to strengthen himself for the potential difficult road ahead.  He denies any questions.  I provided emotional and general support  through therapeutic listening, empathy, sharing of stories, and other techniques. I answered all questions and addressed all concerns to the best of my ability.  I spoke with the heart failure team and conveyed the results of my discussion.  They are still working him up and waiting for decision from the program whether he would be a candidate for LVAD here.  They anticipate possible discharge in the next 24 to 48 hours.  Review of Systems  Constitutional:  Positive for fatigue.       Denies pain in general  Respiratory:  Negative for chest tightness and shortness of breath.   Cardiovascular:  Negative for chest pain.  Gastrointestinal:  Negative for abdominal pain, nausea and vomiting.  Neurological:  Positive for weakness.    Objective:   Vital Signs:  BP 108/64 (BP Location: Right Arm)   Pulse 72   Temp 98.1 F (36.7 C) (Oral)   Resp (!) 23   Ht 5\' 8"  (1.727 m)   Wt 74.4 kg   SpO2 100%   BMI 24.94 kg/m   Physical Exam: Physical Exam Vitals and nursing note reviewed.  Constitutional:      General: He is not in acute distress.    Appearance: He is ill-appearing. He is not toxic-appearing.  HENT:     Head: Normocephalic and atraumatic.  Pulmonary:     Effort: Pulmonary effort is normal. No respiratory distress.  Abdominal:     General: Abdomen is flat. Bowel sounds are normal. There is no distension.     Palpations: Abdomen is soft.     Tenderness: There is  no abdominal tenderness.  Skin:    General: Skin is warm and dry.  Neurological:     General: No focal deficit present.     Mental Status: He is alert.  Psychiatric:        Mood and Affect: Mood is depressed.        Behavior: Behavior normal.     Palliative Assessment/Data: 50%    Existing Vynca/ACP Documentation: None  Assessment & Plan:   Impression: Present on Admission:  Acute HFrEF (heart failure with reduced ejection fraction) (HCC)  77 year old male with chronic comorbidities and acute  presentations as described above. Our role today was palliative follow-up as part of LVAD evaluation. I think the patient has a good understanding of his current health status. He also has a good understanding of the potential for difficult recovery. He feels he has good support from his family and is committed to continued attempts at LVAD knowing the risks. I encouraged him to continue to discuss his wishes in the area of feeding tubes, trach/prolonged vent, etc. with family to help them and voicing his wishes should he be unable to in the future. I would recommend completing living will and HCPOA documentation and possibly a MOST form, especially if he is a candidate for LVAD.  Still indeterminate on whether he qualifies for an LVAD here.  Anticipate discharge in the coming couple days.  Palliative medicine will sign off for now.  Please call us for any further needs.  SUMMARY OF RECOMMENDATIONS   Continue discussions between patient and family on wishes Recommend completing advanced directive paperwork and other advance care planning wants patient/family understand wishes Palliative medicine will sign off for now given likely discharge in 24 to 48 hours Please call us for any significant change or new palliative needs  Symptom Management:  Per primary team PMT is available to assist with recommendations if needed  Code Status: Full code  Prognosis: Unable to determine  Discharge Planning: To Be Determined  Discussed with: Patient, medical team, nursing team  Thank you for allowing [place] to participate in the care of ANIRVIN SLOWE PMT will continue to support holistically.  Time Total: 40 min  Detailed review of medical records (labs, imaging, vital signs), medically appropriate exam, discussed with treatment team, counseling and education to patient, family, & staff, documenting clinical information, medication management, coordination of care  Wynne Dust, NP Palliative Medicine  Team  Team Phone # [phone] (Nights/Weekends)  04/16/2021, 8:17 AM

## 2023-05-22 NOTE — Progress Notes (Signed)
Physical Therapy Treatment Patient Details Name: [name] MRN: [id number] DOB: [birth year] Today's Date: 05/22/2023   History of Present Illness Pt is a 77 yo male presenting to ED 9/26 with a three week history of progressively worsening weakness and dyspnea with exertion. Per note, he has an extensive cardiac history including unstable A-fib with RVR (treated with cardioversion and planned ablations). Undergoing evaluation for LVAD placement. PMH includes paroxysmal A-fib, cardiomyopathy, CHF, GERD    PT Comments  Pt progressing steadily toward goals, but limited by continued instability and significant deconditioning.  Emphasis on transitions to EOB, scooting, standing balance, progression of gait stability, stamina with the RW and without AD   If plan is discharge home, recommend the following: A little help with walking and/or transfers;A little help with bathing/dressing/bathroom;Assistance with cooking/housework;Assist for transportation   Can travel by private vehicle        Equipment Recommendations  Rolling walker (2 wheels)    Recommendations for Other Services       Precautions / Restrictions Precautions Precautions: Fall     Mobility  Bed Mobility Overal bed mobility: Modified Independent             General bed mobility comments: lowered HOB, but not flat.    Transfers Overall transfer level: Needs assistance   Transfers: Sit to/from Stand Sit to Stand: Supervision           General transfer comment: cued pt to push off of the bed, otherwise no assist    Ambulation/Gait   Gait Distance (Feet): 250 Feet Assistive device: Rolling walker (2 wheels) Gait Pattern/deviations: Step-through pattern Gait velocity: slower Gait velocity interpretation: <1.8 ft/sec, indicate of risk for recurrent falls   General Gait Details: Continued episodes of unsteadiness using the RW.  Cues to maintain proximity to the RW and for posture.  Pt able to maintain  sats at 92-94% on RA,  HR 88-93 bpm.  Pt was encouraged to try ambulation without the RW, but close to/using the rail with about the same steadiness.   Stairs             Wheelchair Mobility     Tilt Bed    Modified Rankin (Stroke Patients Only)       Balance Overall balance assessment: Needs assistance Sitting-balance support: No upper extremity supported, Feet supported Sitting balance-Leahy Scale: Fair Sitting balance - Comments: EOB     Standing balance-Leahy Scale: Fair                              Cognition Arousal: Alert Behavior During Therapy: WFL for tasks assessed/performed Overall Cognitive Status: Within Functional Limits for tasks assessed                                          Exercises      General Comments        Pertinent Vitals/Pain Pain Assessment Pain Assessment: No/denies pain    Home Living                          Prior Function            PT Goals (current goals can now be found in the care plan section) Acute Rehab PT Goals PT Goal Formulation: With patient Time For Goal Achievement: 06/04/23  Potential to Achieve Goals: Good Progress towards PT goals: Progressing toward goals    Frequency    Min 1X/week      PT Plan      Co-evaluation              AM-PAC PT "6 Clicks" Mobility   Outcome Measure  Help needed turning from your back to your side while in a flat bed without using bedrails?: None Help needed moving from lying on your back to sitting on the side of a flat bed without using bedrails?: None Help needed moving to and from a bed to a chair (including a wheelchair)?: A Little Help needed standing up from a chair using your arms (e.g., wheelchair or bedside chair)?: A Little Help needed to walk in hospital room?: A Little Help needed climbing 3-5 steps with a railing? : A Lot 6 Click Score: 19    End of Session   Activity Tolerance: Patient tolerated  treatment well Patient left: with call bell/phone within reach;in bed Nurse Communication: Mobility status PT Visit Diagnosis: Unsteadiness on feet (R26.81);Muscle weakness (generalized) (M62.81);Difficulty in walking, not elsewhere classified (R26.2)     Time: 6578-4696 PT Time Calculation (min) (ACUTE ONLY): 15 min  Charges:    $Gait Training: 8-22 mins PT General Charges $$ ACUTE PT VISIT: 1 Visit                     05/22/2023  Jacinto Halim., PT Acute Rehabilitation Services [phone]  (office)   [name] 05/22/2023, 1:35 PM

## 2023-05-22 NOTE — TOC Progression Note (Addendum)
Transition of Care [facility]) - Progression Note    Patient Details  Name: [name] MRN: [id number] Date of Birth: 1945-12-19  Transition of [facility]) CM/SW Contact  Elliot Cousin, RN Phone Number: [phone] 05/22/2023, 2:04 PM  Clinical Narrative:   CM spoke to pt and wife at bedside. No PT/OT needed. Pt declined HH RN. Requested RW with seat. Contacted [employer] for RW with seat. Pt has scale at home for daily weights. DC on Sat, will request meds from [facility] pharmacy.   PCP appt scheduled for 10/10 at 915 am    Expected Discharge Plan: Home w Home Health Services Barriers to Discharge: No Barriers Identified  Expected Discharge Plan and Services   Discharge Planning Services: CM Consult   Living arrangements for the past 2 months: Single Family Home                 DME Arranged: Walker rolling with seat DME Agency: [employer]                   Social Determinants of Health (SDOH) Interventions SDOH Screenings   Food Insecurity: No Food Insecurity (05/16/2023)  Housing: Low Risk  (05/16/2023)  Transportation Needs: No Transportation Needs (05/16/2023)  Utilities: Not At Risk (05/16/2023)  Financial Resource Strain: Low Risk  (11/05/2022)   Received from [facility], [facility]  Physical Activity: Unknown (08/12/2018)  Social Connections: Unknown (08/12/2018)  Stress: No Stress Concern Present (08/12/2018)  Tobacco Use: Medium Risk (05/22/2023)    Readmission Risk Interventions     No data to display

## 2023-05-22 NOTE — Plan of Care (Signed)
  Problem: Education: Goal: Understanding of CV disease, CV risk reduction, and recovery process will improve Outcome: Progressing   Problem: Activity: Goal: Ability to return to baseline activity level will improve Outcome: Progressing   Problem: Cardiovascular: Goal: Ability to achieve and maintain adequate cardiovascular perfusion will improve Outcome: Progressing Goal: Vascular access site(s) Level 0-1 will be maintained Outcome: Progressing   Problem: Health Behavior/Discharge Planning: Goal: Ability to safely manage health-related needs after discharge will improve Outcome: Progressing   Problem: Education: Goal: Knowledge of General Education information will improve Description: Including pain rating scale, medication(s)/side effects and non-pharmacologic comfort measures Outcome: Progressing   Problem: Health Behavior/Discharge Planning: Goal: Ability to manage health-related needs will improve Outcome: Progressing   Problem: Clinical Measurements: Goal: Ability to maintain clinical measurements within normal limits will improve Outcome: Progressing Goal: Will remain free from infection Outcome: Progressing Goal: Diagnostic test results will improve Outcome: Progressing Goal: Respiratory complications will improve Outcome: Progressing Goal: Cardiovascular complication will be avoided Outcome: Progressing   Problem: Activity: Goal: Risk for activity intolerance will decrease Outcome: Progressing   Problem: Nutrition: Goal: Adequate nutrition will be maintained Outcome: Progressing   Problem: Coping: Goal: Level of anxiety will decrease Outcome: Progressing   Problem: Elimination: Goal: Will not experience complications related to bowel motility Outcome: Progressing Goal: Will not experience complications related to urinary retention Outcome: Progressing   Problem: Pain Managment: Goal: General experience of comfort will improve Outcome: Progressing    Problem: Safety: Goal: Ability to remain free from injury will improve Outcome: Progressing   Problem: Skin Integrity: Goal: Risk for impaired skin integrity will decrease Outcome: Progressing   Problem: Education: Goal: Ability to describe self-care measures that may prevent or decrease complications (Diabetes Survival Skills Education) will improve Outcome: Progressing   Problem: Coping: Goal: Ability to adjust to condition or change in health will improve Outcome: Progressing   Problem: Fluid Volume: Goal: Ability to maintain a balanced intake and output will improve Outcome: Progressing   Problem: Health Behavior/Discharge Planning: Goal: Ability to identify and utilize available resources and services will improve Outcome: Progressing Goal: Ability to manage health-related needs will improve Outcome: Progressing   Problem: Metabolic: Goal: Ability to maintain appropriate glucose levels will improve Outcome: Progressing   Problem: Nutritional: Goal: Maintenance of adequate nutrition will improve Outcome: Progressing Goal: Progress toward achieving an optimal weight will improve Outcome: Progressing   Problem: Skin Integrity: Goal: Risk for impaired skin integrity will decrease Outcome: Progressing   Problem: Tissue Perfusion: Goal: Adequacy of tissue perfusion will improve Outcome: Progressing

## 2023-05-22 NOTE — Care Management Important Message (Signed)
Important Message  Patient Details  Name: [name] MRN: [id number] Date of Birth: [birth year]   Important Message Given:  Yes - Medicare IM     Dorena Bodo 05/22/2023, 2:27 PM

## 2023-05-22 NOTE — Progress Notes (Signed)
Pt declined ambulation. Sts he would prefer to walk later after he rests in the bed. Pt aware that I'm unable to return later today. Encouraged being up with staff.  [phone] Ethelda Chick BS, ACSM-CEP 05/22/2023 2:42 PM

## 2023-05-23 DIAGNOSIS — I5021 Acute systolic (congestive) heart failure: Secondary | ICD-10-CM | POA: Diagnosis not present

## 2023-05-23 LAB — CBC
HCT: 33.5 % — ABNORMAL LOW (ref 39.0–52.0)
Hemoglobin: 10.9 g/dL — ABNORMAL LOW (ref 13.0–17.0)
MCH: 32 pg (ref 26.0–34.0)
MCHC: 32.5 g/dL (ref 30.0–36.0)
MCV: 98.2 fL (ref 80.0–100.0)
Platelets: 149 K/uL — ABNORMAL LOW (ref 150–400)
RBC: 3.41 MIL/uL — ABNORMAL LOW (ref 4.22–5.81)
RDW: 14.3 % (ref 11.5–15.5)
WBC: 8.5 K/uL (ref 4.0–10.5)
nRBC: 0 % (ref 0.0–0.2)

## 2023-05-23 LAB — BASIC METABOLIC PANEL
Anion gap: 5 (ref 5–15)
BUN: 29 mg/dL — ABNORMAL HIGH (ref 8–23)
CO2: 24 mmol/L (ref 22–32)
Calcium: 7.6 mg/dL — ABNORMAL LOW (ref 8.9–10.3)
Chloride: 101 mmol/L (ref 98–111)
Creatinine, Ser: 1.32 mg/dL — ABNORMAL HIGH (ref 0.61–1.24)
GFR, Estimated: 56 mL/min — ABNORMAL LOW (ref >60)
Glucose, Bld: 119 mg/dL — ABNORMAL HIGH (ref 70–99)
Potassium: 4.8 mmol/L (ref 3.5–5.1)
Sodium: 130 mmol/L — ABNORMAL LOW (ref 135–145)

## 2023-05-23 LAB — COOXEMETRY PANEL
Carboxyhemoglobin: 2.9 % — ABNORMAL HIGH (ref 0.5–1.5)
Methemoglobin: <0.7 % (ref 0.0–1.5)
O2 Saturation: 75.3 %
Total hemoglobin: 10.6 g/dL — ABNORMAL LOW (ref 12.0–16.0)

## 2023-05-23 LAB — MISC LABCORP TEST (SEND OUT): Labcorp test code: 650003

## 2023-05-23 MED ORDER — GUAIFENESIN-DM 100-10 MG/5ML PO SYRP: 5.0000 mL | ORAL_SOLUTION | ORAL | Status: DC | PRN

## 2023-05-23 MED ORDER — LOSARTAN POTASSIUM 25 MG PO TABS
12.5000 mg | ORAL_TABLET | Freq: Every day | ORAL | Status: DC
  Administered 2023-05-23: 12.5 mg via ORAL
  Filled 2023-05-23: qty 1

## 2023-05-23 NOTE — Progress Notes (Signed)
Still with on and off non productive cough.  Refused to take cough syrup. Explained to him that it helps him reduce cough . Continue to monitor.

## 2023-05-23 NOTE — Progress Notes (Signed)
Sitting in a  bedside chair eating breakfast with toasted bread and scrambled  eggs. Started coughing continuously   non productive cough. Tap back several times, suctioned mouth with [place] with small  clear sputum and  some  food particles  with relief. Claimed  that his throat is dry and kept doing this.  Mouth care done, continue to monitor.

## 2023-05-23 NOTE — Progress Notes (Signed)
Advanced Heart Failure Rounding Note  PCP-Cardiologist: Olga Millers, MD   Subjective:    - Mixed venous remains > 70 - Doing well on exam - CVP 4-5 - 1.3L urine output.   Objective:   Weight Range: 75.7 kg Body mass index is 25.38 kg/m.   Vital Signs:   Temp:  [98.3 F (36.8 C)-98.8 F (37.1 C)] 98.6 F (37 C) (10/05 0738) Pulse Rate:  [69-85] 85 (10/05 0738) Resp:  [17-23] 23 (10/05 0738) BP: (102-178)/(46-80) 178/80 (10/05 0738) SpO2:  [93 %-100 %] 93 % (10/05 0738) Weight:  [75.7 kg] 75.7 kg (10/05 0400) Last BM Date : 05/22/23  Weight change: Filed Weights   05/21/23 1805 05/22/23 0446 05/23/23 0400  Weight: 77.9 kg 74.4 kg 75.7 kg    Intake/Output:   Intake/Output Summary (Last 24 hours) at 05/23/2023 1015 Last data filed at 05/23/2023 0240 Gross per 24 hour  Intake 156.31 ml  Output 1050 ml  Net -893.69 ml      Physical Exam    CVP 3-4 General:  Well appearing. On 2L [place]. HEENT: normal Neck: supple. JVD 6 cm.  Carotids 2+ bilat; no bruits. No lymphadenopathy or thryomegaly appreciated. Cor: PMI nondisplaced. Regular rate & rhythm. No rubs, gallops or murmurs. Lungs: Mild expiratory wheeze in lung bases Abdomen: soft, nontender, nondistended. No hepatosplenomegaly. No bruits or masses. Good bowel sounds. Extremities: no cyanosis, clubbing, rash, edema Neuro: alert & orientedx3, cranial nerves grossly intact. moves all 4 extremities w/o difficulty. Affect pleasant  Telemetry   SR 70s w 1 deg AV block  Labs    CBC Recent Labs    05/22/23 0450 05/23/23 0540  WBC 7.0 8.5  HGB 10.5* 10.9*  HCT 32.0* 33.5*  MCV 95.2 98.2  PLT 125* 149*   Basic Metabolic Panel Recent Labs    65/78/46 0321 05/22/23 0450 05/22/23 0959  NA 131* 131*  --   K 3.7 4.3  --   CL 100 99  --   CO2 25 23  --   GLUCOSE 122* 100*  --   BUN 36* 33*  --   CREATININE 1.62* 1.44*  --   CALCIUM 7.6* 7.7*  --   MG  --   --  2.1    BNP: BNP (last 3  results) Recent Labs    05/14/23 0946  BNP 4,176.1*    Other results:   Imaging    No results found.   Medications:     Scheduled Medications:  amiodarone  200 mg Oral BID   apixaban  5 mg Oral BID   Chlorhexidine Gluconate Cloth  6 each Topical Daily   ciprofloxacin  2 drop Right Eye Q4H while awake   digoxin  0.125 mg Oral Daily   empagliflozin  10 mg Oral Daily   feeding supplement  237 mL Oral BID BM   sodium chloride flush  10-40 mL Intracatheter Q12H   sodium chloride flush  3 mL Intravenous Q12H   spironolactone  12.5 mg Oral Daily    Infusions:  sodium chloride 5 mL/hr at 05/22/23 0617    PRN Medications: sodium chloride, acetaminophen, ALPRAZolam, Gerhardt's butt cream, loperamide, naphazoline-glycerin, ondansetron (ZOFRAN) IV, mouth rinse, polyethylene glycol, senna, sodium chloride, sodium chloride flush, sodium chloride flush    Patient Profile   77 y.o. male with chronic HFrEF secondary to nonischemic cardiomyopathy (diagnosed in 2004), persistent atrial fibrillation, hypertension, hyperlipidemia, thoracic aortic aneurysm currently admitted with cardiogenic shock.  Over the past several months  he has had significant difficulty maintaining sinus rhythm. He underwent elective cardioversion in August 2024 with improvement in functional status for 2 to 3 weeks before he started having progressive fatigue and exertional dyspnea.  Most recently he was seen in cardiology clinic where he was back in atrial flutter.  Over the next several days he became rapidly volume overloaded and presented to [facility] emergency department in cardiogenic shock with a lactic acid of 4.7 and LFTs rising quickly to the thousands.  He was started on milrinone 0.125 mcg/kg/min and transferred to [facility].   Assessment/Plan   SCAI C cardiogenic shock 2/2 nonischemic cardiomyopathy -Heart failure dating back to 2004; diagnosed at [facility] at that time EF of 25% with normal coronary arteries.   Endomyocardial biopsy was also negative.  Felt to be secondary to ventricular ectopy and was subsequently started on amiodarone. -EF of 35 to 40% in 2019-2021, however, since that time it has been 25 to 30% persistently. -Remained fairly well compensated over the past several months until difficulties with atrial tachyarrhythmias leading to current decompensation.  -Initial lab work concerning for cardiogenic shock with lactic acid elevated to 4.7, LFTs up to 6300/2500 with serum creatinine up to 4.5.  -Echo 05/18/23: EF < 20%, RV mildly reduced -CO-OX stable at 75 -CVP 3-4 Continue to hold diuresis. -Continue digoxin 0.125 mg daily -No beta blocker with recent shock -continue spironolactone 12.5mg  daily.  -will try losartan 12.5mg  tonight.  -plan for discharge home tomorrow.  -VAD workup initiated. Malnutrition and alpha gal may be biggest barriers to LVAD implant. Seen by TCTS. Would be high risk for bleeding with bivalirudin for anticoagulation. May need referral to [facility] for consideration of high-risk VAD implant.   2.  Atrial fibrillation/atrial flutter - Echocardiogram in March 2024 with a EF of 25 to 30%.  Diagnosed with new onset atrial fibrillation in July 2024.  Underwent elective cardioversion in April 07, 2023 with some improvement afterwards. -Presented to cardiology clinic on 05/12/2023; at that time noted to be in atrial flutter  - Seen by EP. Plan for consideration of ablation as outpatient - S/p TEE/DCCV on 10/01. Maintaining SR this am.  - Continue po amiodarone 200 mg BID - Anticoagulated with bival d/t alpha gal, remains sub therapeutic, titration management per PharmD  3. AKI on CKD - BMP added on; pending.  - Continue to follow - Continue to hold diuresis as above   4. Acute liver injury - AST/ALT on admission 235/138 with rise to 4500/2100 within hours. LFTs up to 6500 . LFTs improving significantly - Does not fit with amiodarone induced toxicity.  - INR > 10 on  admit, now 1.5 - bleeding from IV sites improved   5. 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 Left upper extremity swelling - Venous dopplers negative for DVT - Possible amiodarone extravasation  7. Nutrition - Prealbumin is 6.  - Encouraged PO intake. Drinking ensure shakes and appetite improving.  - Dietician consulted  8. Urinary retention - Hold Cardura and reassess need  9. Constipation - Escalate bowel regimen  Transfer out of ICU today  Length of Stay: 8  Jina Olenick, DO  05/23/2023, 10:15 AM  Advanced Heart Failure Team Pager [phone] (M-F; 7a - 5p)  Please contact [facility] for night-coverage after hours (5p -7a ) and weekends on [email or web address]

## 2023-05-23 NOTE — Progress Notes (Signed)
Mobility Specialist Progress Note    05/23/23 1448  Mobility  Activity Ambulated with assistance in hallway  Level of Assistance Contact guard assist, steadying assist  Assistive Device Four wheel walker  Distance Ambulated (ft) 250 ft  Activity Response Tolerated well  Mobility Referral Yes  $Mobility charge 1 Mobility  Mobility Specialist Start Time (ACUTE ONLY) 1439  Mobility Specialist Stop Time (ACUTE ONLY) 1447  Mobility Specialist Time Calculation (min) (ACUTE ONLY) 8 min   Pre-Mobility: 79 HR, 96% SpO2 During Mobility: 89 HR Post-Mobility: 88 HR  Pt received in bed and agreeable. No complaints on walk. Returned to bed with call bell in reach.   [place] Mobility Specialist  Please Neurosurgeon or Rehab Office at [phone]

## 2023-05-23 NOTE — Progress Notes (Addendum)
CARDIAC REHAB PHASE I   PRE:  Rate/Rhythm: 80 NSR  BP:  Sitting: 115/60      SaO2: 95 RA  MODE:  Ambulation: 340 ft   AD:   RW  POST:  Rate/Rhythm: 86 NSR  BP:  Sitting: 162/90      SaO2: 97 RA  Pt amb with supervision assistance, pt denies chest pain and dyspnea during ambulation. Pt was returned to bed without c/p.  Pt currently n/a for CRP2 as he is currently being evaluated for LVAD  Faustino Congress  ACSM-CEP 9:08 AM 05/23/2023    Service time is from 0845 to 0908.

## 2023-05-24 ENCOUNTER — Other Ambulatory Visit: Payer: Self-pay | Admitting: Cardiology

## 2023-05-24 DIAGNOSIS — I5021 Acute systolic (congestive) heart failure: Secondary | ICD-10-CM | POA: Diagnosis not present

## 2023-05-24 LAB — CBC
HCT: 32.6 % — ABNORMAL LOW (ref 39.0–52.0)
Hemoglobin: 10.5 g/dL — ABNORMAL LOW (ref 13.0–17.0)
MCH: 31.1 pg (ref 26.0–34.0)
MCHC: 32.2 g/dL (ref 30.0–36.0)
MCV: 96.4 fL (ref 80.0–100.0)
Platelets: 176 10*3/uL (ref 150–400)
RBC: 3.38 MIL/uL — ABNORMAL LOW (ref 4.22–5.81)
RDW: 14.3 % (ref 11.5–15.5)
WBC: 8.4 10*3/uL (ref 4.0–10.5)
nRBC: 0 % (ref 0.0–0.2)

## 2023-05-24 LAB — COOXEMETRY PANEL
Carboxyhemoglobin: 2.9 % — ABNORMAL HIGH (ref 0.5–1.5)
Methemoglobin: 0.7 % (ref 0.0–1.5)
O2 Saturation: 76.9 %
Total hemoglobin: 11 g/dL — ABNORMAL LOW (ref 12.0–16.0)

## 2023-05-24 MED ORDER — EMPAGLIFLOZIN 10 MG PO TABS: 10.0000 mg | ORAL_TABLET | Freq: Every day | ORAL | 2 refills | Status: DC

## 2023-05-24 MED ORDER — AMIODARONE HCL 200 MG PO TABS: ORAL_TABLET | ORAL | 0 refills | Status: DC

## 2023-05-24 MED ORDER — LOSARTAN POTASSIUM 25 MG PO TABS: 25.0000 mg | ORAL_TABLET | Freq: Every day | ORAL | 2 refills | Status: DC

## 2023-05-24 MED ORDER — APIXABAN 5 MG PO TABS: 5.0000 mg | ORAL_TABLET | Freq: Two times a day (BID) | ORAL | 2 refills | Status: DC

## 2023-05-24 MED ORDER — SPIRONOLACTONE 25 MG PO TABS: 25.0000 mg | ORAL_TABLET | Freq: Every day | ORAL | 2 refills | Status: DC

## 2023-05-24 MED ORDER — FUROSEMIDE 20 MG PO TABS: 20.0000 mg | ORAL_TABLET | Freq: Every day | ORAL | 1 refills | Status: AC | PRN

## 2023-05-24 MED ORDER — DIGOXIN 125 MCG PO TABS: 0.1250 mg | ORAL_TABLET | Freq: Every day | ORAL | 1 refills | Status: DC

## 2023-05-24 MED ORDER — SPIRONOLACTONE 25 MG PO TABS
25.0000 mg | ORAL_TABLET | Freq: Every day | ORAL | Status: DC
  Administered 2023-05-24: 25 mg via ORAL
  Filled 2023-05-24: qty 1

## 2023-05-24 MED ORDER — LOSARTAN POTASSIUM 25 MG PO TABS: 25.0000 mg | ORAL_TABLET | Freq: Every day | ORAL | Status: DC

## 2023-05-24 MED ORDER — DIPHENHYDRAMINE HCL 25 MG PO CAPS
25.0000 mg | ORAL_CAPSULE | Freq: Once | ORAL | Status: AC
  Administered 2023-05-24: 25 mg via ORAL
  Filled 2023-05-24: qty 1

## 2023-05-24 NOTE — Progress Notes (Signed)
Patient ID: [name], male   DOB: 1945-11-28, 77 y.o.   MRN: [id number]     Advanced Heart Failure Rounding Note  PCP-Cardiologist: Olga Millers, MD   Subjective:    No complaints today.  Wants to go home.   Creatinine 1.44 => 1.32, co-ox 77%.  CVP not hooked up.  Denies dyspnea.  BP stable.   Objective:   Weight Range: 74.7 kg Body mass index is 25.04 kg/m.   Vital Signs:   Temp:  [97.7 F (36.5 C)-99.1 F (37.3 C)] 97.7 F (36.5 C) (10/06 0741) Pulse Rate:  [73-80] 73 (10/06 0356) Resp:  [18-22] 20 (10/06 0356) BP: (105-137)/(49-70) 106/53 (10/06 0741) SpO2:  [92 %-97 %] 95 % (10/06 0356) Weight:  [74.7 kg] 74.7 kg (10/06 0620) Last BM Date : 05/22/23  Weight change: Filed Weights   05/22/23 0446 05/23/23 0400 05/24/23 0620  Weight: 74.4 kg 75.7 kg 74.7 kg    Intake/Output:   Intake/Output Summary (Last 24 hours) at 05/24/2023 0910 Last data filed at 05/24/2023 0745 Gross per 24 hour  Intake 727 ml  Output 1525 ml  Net -798 ml      Physical Exam   General: NAD Neck: No JVD, no thyromegaly or thyroid nodule.  Lungs: Clear to auscultation bilaterally with normal respiratory effort. CV: Nondisplaced PMI.  Heart regular S1/S2, no S3/S4, no murmur.  No peripheral edema.   Abdomen: Soft, nontender, no hepatosplenomegaly, no distention.  Skin: Intact without lesions or rashes.  Neurologic: Alert and oriented x 3.  Psych: Normal affect. Extremities: No clubbing or cyanosis.  HEENT: Normal.    Telemetry   Regular, probably NSR 80s (personally reviewed)  Labs    CBC Recent Labs    05/23/23 0540 05/24/23 0351  WBC 8.5 8.4  HGB 10.9* 10.5*  HCT 33.5* 32.6*  MCV 98.2 96.4  PLT 149* 176   Basic Metabolic Panel Recent Labs    69/62/95 0450 05/22/23 0959 05/23/23 1440  NA 131*  --  130*  K 4.3  --  4.8  CL 99  --  101  CO2 23  --  24  GLUCOSE 100*  --  119*  BUN 33*  --  29*  CREATININE 1.44*  --  1.32*  CALCIUM 7.7*  --  7.6*  MG   --  2.1  --     BNP: BNP (last 3 results) Recent Labs    05/14/23 0946  BNP 4,176.1*    Other results:   Imaging    No results found.   Medications:     Scheduled Medications:  amiodarone  200 mg Oral BID   apixaban  5 mg Oral BID   Chlorhexidine Gluconate Cloth  6 each Topical Daily   digoxin  0.125 mg Oral Daily   empagliflozin  10 mg Oral Daily   feeding supplement  237 mL Oral BID BM   losartan  25 mg Oral QHS   sodium chloride flush  10-40 mL Intracatheter Q12H   sodium chloride flush  3 mL Intravenous Q12H   spironolactone  25 mg Oral Daily    Infusions:    PRN Medications: acetaminophen, ALPRAZolam, Gerhardt's butt cream, guaiFENesin-dextromethorphan, loperamide, naphazoline-glycerin, ondansetron (ZOFRAN) IV, mouth rinse, polyethylene glycol, senna, sodium chloride, sodium chloride flush, sodium chloride flush    Patient Profile   77 y.o. male with chronic HFrEF secondary to nonischemic cardiomyopathy (diagnosed in 2004), persistent atrial fibrillation, hypertension, hyperlipidemia, thoracic aortic aneurysm currently admitted with cardiogenic shock.  Over  the past several months he has had significant difficulty maintaining sinus rhythm. He underwent elective cardioversion in August 2024 with improvement in functional status for 2 to 3 weeks before he started having progressive fatigue and exertional dyspnea.  Most recently he was seen in cardiology clinic where he was back in atrial flutter.  Over the next several days he became rapidly volume overloaded and presented to [facility] emergency department in cardiogenic shock with a lactic acid of 4.7 and LFTs rising quickly to the thousands.  He was started on milrinone 0.125 mcg/kg/min and transferred to [facility].   Assessment/Plan   SCAI C cardiogenic shock 2/2 nonischemic cardiomyopathy -Heart failure dating back to 2004; diagnosed at [facility] at that time EF of 25% with normal coronary arteries.  Endomyocardial  biopsy was also negative.  Felt to be secondary to ventricular ectopy and was subsequently started on amiodarone. -EF of 35 to 40% in 2019-2021, however, since that time it has been 25 to 30% persistently. -Remained fairly well compensated over the past several months until difficulties with atrial tachyarrhythmias leading to current decompensation.  -Initial lab work concerning for cardiogenic shock with lactic acid elevated to 4.7, LFTs up to 6300/2500 with serum creatinine up to 4.5.  -Echo 05/18/23: EF < 20%, RV mildly reduced -CO-OX stable at 77% -CVP not hooked up but no JVD on exam.  Can use Lasix 20 mg prn at home. -Continue digoxin 0.125 mg daily -No beta blocker with recent shock -Increase spironolactone to 25 mg daily.  -Increase losartan to 25 mg at bedtime.  -plan for discharge home today -VAD workup initiated. Malnutrition and alpha gal may be biggest barriers to LVAD implant. Seen by TCTS. Would be high risk for bleeding with bivalirudin for anticoagulation peri-LVAD. May need referral to [facility] for consideration of high-risk VAD implant.   2.  Atrial fibrillation/atrial flutter - Echocardiogram in March 2024 with a EF of 25 to 30%.  Diagnosed with new onset atrial fibrillation in July 2024.  Underwent elective cardioversion in April 07, 2023 with some improvement afterwards. -Presented to cardiology clinic on 05/12/2023; at that time noted to be in atrial flutter  - Seen by EP. Plan for consideration of ablation as outpatient - S/p TEE/DCCV on 10/01. Maintaining SR this am (rhythm difficult on telemetry, looks regular with low voltage P's but will confirm with ECG).  - Continue po amiodarone 200 mg BID x 10 days then 200 mg daily.  - Continue apixaban.   3. AKI on CKD - Creatinine trending down, 1.32 today.    4. Acute liver injury - AST/ALT on admission 235/138 with rise to 4500/2100 within hours. LFTs up to 6500 . LFTs improving significantly.  Suspect shock liver with CHF.   - Does not fit with amiodarone induced toxicity.    5. 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   6. Left upper extremity swelling - Venous dopplers negative for DVT - Possible amiodarone extravasation  7. Nutrition - Prealbumin is 6.  - Encouraged PO intake. Drinking ensure shakes and appetite improving.  - Dietician consulted  OK for home today.  Needs close followup in CHF clinic at [facility] (10 days).  Cardiac rehab at [facility].  Cardiac meds for discharge: losartan 25 at bedtime, spironolactone 25  daily, apixaban 5 bid, amiodarone 200 bid x 10 days then 200 daily, digoxin 0.125 daily, empagliflozin 10 daily.   Length of Stay: 9  Marca Ancona, MD  05/24/2023, 9:10 AM  Advanced Heart Failure Team Pager [phone] (M-F; 7a - 5p)  Please contact [facility] for night-coverage after hours (5p -7a ) and weekends on [email or web address]

## 2023-05-24 NOTE — Progress Notes (Signed)
Removed PICC line without complications. Patient is staying for 30 mins. Educated patient & wife how to take care of dressing. Informed patient's RN regarding this. HS [employer]

## 2023-05-24 NOTE — TOC Transition Note (Signed)
Transition of Care [facility]) - CM/SW Discharge Note   Patient Details  Name: [name] MRN: [id number] Date of Birth: December 09, 1945  Transition of [facility]) CM/SW Contact:  Ronny Bacon, RN Phone Number: 05/24/2023, 10:31 AM   Clinical Narrative:  Patient is being discharged today. Spoke with patient and family by phone, confirmed that patient has rolling walker with seat.    Final next level of care: Home/Self Care Barriers to Discharge: No Barriers Identified   Patient Goals and CMS Choice      Discharge Placement                         Discharge Plan and Services Additional resources added to the After Visit Summary for     Discharge Planning Services: CM Consult            DME Arranged: Walker rolling with seat DME Agency: [employer]                  Social Determinants of Health (SDOH) Interventions SDOH Screenings   Food Insecurity: No Food Insecurity (05/16/2023)  Housing: Low Risk  (05/16/2023)  Transportation Needs: No Transportation Needs (05/16/2023)  Utilities: Not At Risk (05/16/2023)  Financial Resource Strain: Low Risk  (11/05/2022)   Received from [facility], [facility]  Physical Activity: Unknown (08/12/2018)  Social Connections: Unknown (08/12/2018)  Stress: No Stress Concern Present (08/12/2018)  Tobacco Use: Medium Risk (05/22/2023)     Readmission Risk Interventions     No data to display

## 2023-05-25 ENCOUNTER — Encounter (HOSPITAL_COMMUNITY): Payer: Medicare Other

## 2023-05-25 LAB — LUPUS ANTICOAGULANT PANEL
DRVVT: 200.4 s — ABNORMAL HIGH (ref 0.0–47.0)
PTT Lupus Anticoagulant: 55 s — ABNORMAL HIGH (ref 0.0–43.5)

## 2023-05-25 LAB — DRVVT CONFIRM

## 2023-05-25 LAB — PTT-LA MIX: PTT-LA Mix: 52 s — ABNORMAL HIGH (ref 0.0–40.5)

## 2023-05-25 LAB — DRVVT MIX: dRVVT Mix: 69.6 s — ABNORMAL HIGH (ref 0.0–40.4)

## 2023-05-25 LAB — HEXAGONAL PHASE PHOSPHOLIPID: Hexagonal Phase Phospholipid: 5 s (ref 0–11)

## 2023-05-26 ENCOUNTER — Other Ambulatory Visit (HOSPITAL_COMMUNITY): Payer: Self-pay | Admitting: Unknown Physician Specialty

## 2023-05-27 ENCOUNTER — Other Ambulatory Visit (HOSPITAL_COMMUNITY): Payer: Self-pay

## 2023-05-31 NOTE — Progress Notes (Deleted)
Electrophysiology Office Note:    Date:  05/31/2023   ID:  [name], DOB 1945/10/09, MRN [id number]  [facility] Cardiologist:  Olga Millers, MD  [facility] Electrophysiologist:  Lanier Prude, MD   Referring MD: Lewayne Bunting, MD   Chief Complaint: Chronic systolic heart failure and atrial fibrillation  History of Present Illness:    [name] is a 77 y.o. malewho I am seeing today for an evaluation of chronic systolic heart failure and atrial fibrillation at the request of [name].  The patient was last seen by Dr. Lucien Mons when hospitalized in September 2024.  He was hospitalized from September 27 through May 24, 2023.  The patient has a medical history that includes chronic systolic heart failure due to nonischemic cardiomyopathy, persistent atrial fibrillation, alpha gal, hypertension, hyperlipidemia, thoracic aortic aneurysm.  His atrial fibrillation was diagnosed in September 2024.  He required milrinone during his hospitalization recently.  Lasix and amnio drips were used along with Levophed.  He takes Eliquis as an outpatient for stroke prophylaxis.  He is pending workup for LVAD.          Their past medical, social and family history was reveiwed.   ROS:   Please see the history of present illness.    All other systems reviewed and are negative.  EKGs/Labs/Other Studies Reviewed:    The following studies were reviewed today:  May 18, 2023 echo EF less than 20% RV function mildly reduced Mildly dilated left atrium Mild MR  May 24, 2023 EKG shows sinus rhythm, QRS duration 128 ms  May 14, 2023 EKG shows atrial flutter with a ventricular rate in the 90s      Physical Exam:    VS:  There were no vitals taken for this visit.    Wt Readings from Last 3 Encounters:  05/24/23 164 lb 10.9 oz (74.7 kg)  05/15/23 168 lb 1.6 oz (76.2 kg)  05/12/23 165 lb (74.8 kg)     GEN: *** Well nourished, well  developed in no acute distress CARDIAC: ***RRR, no murmurs, rubs, gallops RESPIRATORY:  Clear to auscultation without rales, wheezing or rhonchi       ASSESSMENT AND PLAN:    No diagnosis found.  #Persistent atrial fibrillation and flutter #High risk drug monitoring-amiodarone  associated with severe LV dysfunction Rhythm control indicated Continue amiodarone He is a poor candidate for catheter ablation given his severe/advanced comorbidities Recommend continuing amiodarone while he undergoes workup for possible LVAD Could consider maze at the time of LVAD implantation if he ultimately receives this therapy  Repeat CMP, TSH and free T4 today   #Chronic systolic heart failure NYHA class III-IV.  Warm and euvolemic on exam today Continue digoxin, Jardiance, Lasix, losartan, and spironolactone Rhythm control indicated as above  Follow-up with EP APP in 6 months.     Signed, Rossie Muskrat. Lalla Brothers, MD, [facility], [facility] 05/31/2023 4:22 PM    Electrophysiology [facility]

## 2023-06-01 ENCOUNTER — Ambulatory Visit (HOSPITAL_COMMUNITY)
Admission: RE | Admit: 2023-06-01 | Discharge: 2023-06-01 | Disposition: A | Discharge status: Home / Self Care | Payer: Medicare Other | Source: Ambulatory Visit | Attending: Cardiology | Admitting: Cardiology

## 2023-06-01 ENCOUNTER — Ambulatory Visit: Payer: Medicare Other | Admitting: Cardiology

## 2023-06-01 ENCOUNTER — Other Ambulatory Visit: Payer: Self-pay

## 2023-06-01 ENCOUNTER — Encounter: Payer: Medicare Other | Admitting: Cardiology

## 2023-06-01 DIAGNOSIS — I4819 Other persistent atrial fibrillation: Secondary | ICD-10-CM

## 2023-06-01 DIAGNOSIS — I5021 Acute systolic (congestive) heart failure: Secondary | ICD-10-CM | POA: Insufficient documentation

## 2023-06-01 DIAGNOSIS — I130 Hypertensive heart and chronic kidney disease with heart failure and stage 1 through stage 4 chronic kidney disease, or unspecified chronic kidney disease: Secondary | ICD-10-CM | POA: Diagnosis not present

## 2023-06-01 DIAGNOSIS — Z91018 Allergy to other foods: Secondary | ICD-10-CM

## 2023-06-01 DIAGNOSIS — Z79899 Other long term (current) drug therapy: Secondary | ICD-10-CM

## 2023-06-01 DIAGNOSIS — Z7901 Long term (current) use of anticoagulants: Secondary | ICD-10-CM | POA: Diagnosis not present

## 2023-06-01 DIAGNOSIS — Z91014 Allergy to mammalian meats: Secondary | ICD-10-CM | POA: Insufficient documentation

## 2023-06-01 DIAGNOSIS — M1990 Unspecified osteoarthritis, unspecified site: Secondary | ICD-10-CM | POA: Insufficient documentation

## 2023-06-01 DIAGNOSIS — I420 Dilated cardiomyopathy: Secondary | ICD-10-CM | POA: Insufficient documentation

## 2023-06-01 DIAGNOSIS — E88A Wasting disease (syndrome) due to underlying condition: Secondary | ICD-10-CM | POA: Diagnosis not present

## 2023-06-01 DIAGNOSIS — I519 Heart disease, unspecified: Secondary | ICD-10-CM

## 2023-06-01 DIAGNOSIS — I447 Left bundle-branch block, unspecified: Secondary | ICD-10-CM | POA: Insufficient documentation

## 2023-06-01 DIAGNOSIS — N1832 Chronic kidney disease, stage 3b: Secondary | ICD-10-CM | POA: Diagnosis not present

## 2023-06-01 DIAGNOSIS — I480 Paroxysmal atrial fibrillation: Secondary | ICD-10-CM | POA: Insufficient documentation

## 2023-06-01 DIAGNOSIS — E46 Unspecified protein-calorie malnutrition: Secondary | ICD-10-CM | POA: Insufficient documentation

## 2023-06-01 DIAGNOSIS — I712 Thoracic aortic aneurysm, without rupture, unspecified: Secondary | ICD-10-CM | POA: Diagnosis not present

## 2023-06-01 DIAGNOSIS — I440 Atrioventricular block, first degree: Secondary | ICD-10-CM | POA: Diagnosis not present

## 2023-06-01 DIAGNOSIS — E785 Hyperlipidemia, unspecified: Secondary | ICD-10-CM | POA: Diagnosis not present

## 2023-06-01 DIAGNOSIS — I4892 Unspecified atrial flutter: Secondary | ICD-10-CM | POA: Insufficient documentation

## 2023-06-01 DIAGNOSIS — N189 Chronic kidney disease, unspecified: Secondary | ICD-10-CM | POA: Diagnosis not present

## 2023-06-01 DIAGNOSIS — E7429 Other disorders of galactose metabolism: Secondary | ICD-10-CM | POA: Diagnosis not present

## 2023-06-01 DIAGNOSIS — I5022 Chronic systolic (congestive) heart failure: Secondary | ICD-10-CM

## 2023-06-01 DIAGNOSIS — Z87891 Personal history of nicotine dependence: Secondary | ICD-10-CM | POA: Insufficient documentation

## 2023-06-01 LAB — COMPREHENSIVE METABOLIC PANEL
ALT: 94 U/L — ABNORMAL HIGH (ref 0–44)
AST: 75 U/L — ABNORMAL HIGH (ref 15–41)
Albumin: 2.9 g/dL — ABNORMAL LOW (ref 3.5–5.0)
Alkaline Phosphatase: 107 U/L (ref 38–126)
Anion gap: 11 (ref 5–15)
BUN: 43 mg/dL — ABNORMAL HIGH (ref 8–23)
CO2: 24 mmol/L (ref 22–32)
Calcium: 9 mg/dL (ref 8.9–10.3)
Chloride: 97 mmol/L — ABNORMAL LOW (ref 98–111)
Creatinine, Ser: 1.65 mg/dL — ABNORMAL HIGH (ref 0.61–1.24)
GFR, Estimated: 43 mL/min — ABNORMAL LOW (ref >60)
Glucose, Bld: 100 mg/dL — ABNORMAL HIGH (ref 70–99)
Potassium: 4.9 mmol/L (ref 3.5–5.1)
Sodium: 132 mmol/L — ABNORMAL LOW (ref 135–145)
Total Bilirubin: 1.8 mg/dL — ABNORMAL HIGH (ref 0.3–1.2)
Total Protein: 6.3 g/dL — ABNORMAL LOW (ref 6.5–8.1)

## 2023-06-01 LAB — BRAIN NATRIURETIC PEPTIDE: B Natriuretic Peptide: 790.5 pg/mL — ABNORMAL HIGH (ref 0.0–100.0)

## 2023-06-01 LAB — DIGOXIN LEVEL: Digoxin Level: 1.8 ng/mL (ref 0.8–2.0)

## 2023-06-01 LAB — FACTOR 5 LEIDEN

## 2023-06-01 MED ORDER — LOSARTAN POTASSIUM 25 MG PO TABS: 25.0000 mg | ORAL_TABLET | Freq: Two times a day (BID) | ORAL | 2 refills | Status: DC

## 2023-06-01 NOTE — Progress Notes (Signed)
H&V Care Navigation CSW Progress Note  Clinical Social Worker met with patient and patient wife to complete LVAD Initial Psychosocial Screening.  Introduced self and purpose of the interview.  Patient reporting he has had a rough morning and would not want to stay for much longer- when CSW explained interview would take at a minimum to an hour he asked to leave and complete interview at a later date.  LVAD coordinators informed and CSW team will follow up with patient either by telephone (which is the patients preference) or at another in person appt.   SDOH Screenings   Food Insecurity: No Food Insecurity (05/28/2023)   Received from [facility]  Housing: Low Risk  (05/16/2023)  Transportation Needs: No Transportation Needs (05/28/2023)   [facility] from [facility]  Utilities: Not At Risk (05/28/2023)   Received from [facility]  Financial Resource Strain: Low Risk  (05/28/2023)   Received from [facility]  Physical Activity: Unknown (08/12/2018)  Social Connections: Unknown (08/12/2018)  Stress: No Stress Concern Present (08/12/2018)  Tobacco Use: Medium Risk (05/28/2023)   Received from [facility]   Burna Sis, LCSW Clinical Social Worker Advanced Heart Failure Clinic Desk#: [phone] Cell#: [phone]

## 2023-06-01 NOTE — Progress Notes (Signed)
Patient presents for hosp f/u in VAD Clinic today. Recent hospitalization with initiation of VAD evaluation.    Received call from secretary up front that pt is not feeling well and requiring a WC. Brought pt into VAD room, placed on monitor and BP taken. Pt states that he is feeling better "I think I had a panic attack".   Pt states that he went to his PCP on Thursday. He tells me that he was put on Prednisone taper for "aches and pains."  Pt states that he took at Lasix on 10/8,9,10. His weight is down 8 lbs from hosp d/c but his ankles have 2+ pitting edema.  Pt tells me that he is "very active." But wife states that the pt sits in a chair all day and only gets up to go to the bathroom.  The pt refused to have his social eval today because he has a sore on his bottom and it is hurting. He states "I just need to get out of here."  PFTs scheduled in 1 mo to follow his appt with Dr Gasper Lloyd.  EKG obtained today.   Vital Signs:  HR: 72 BP: 134/64 (85) SPO2: 100 %   Weight: 156.2 lb w/o eqt Last weight: 164 lb Home weights: 153 lbs   Symptom YES NO DETAILS  Angina  x Activity:  Claudication  x How Far:  Syncope  x When:  Stroke  x   Orthopnea x  How many pillows:3  PND  x How often:  CPAP  x How many hours:  Pedal Edema x  2+ ankle  Abdominal Fullness  x   Nausea / Vomit  x   Diaphoresis  x When:  Shortness of Breath  x Activity:  Palpitations  x When:  ICD shock  N/a   Bleeding S/S  x   Tea-colored Urine  x   Hospitalizations x  9/27-10/6 cardiogenic shock  Emergency Room x  9/26-ED at [facility] then transferred to [facility]  Other MD x  Pt states that he went to his PCP on thursday  Activity sedentary  Fluid   Diet       Device: n/a   Patient Instructions:  Stop Prednisone Increase Losartan to 25 mg twice a day Take Lasix 20 mg for the next 3 days and then only take when you need it Return to clinic in 1 mo with a     Carlton Adam, RN VAD Coordinator     Office: [phone] 24/7 Emergency VAD Pager: [phone]

## 2023-06-01 NOTE — Patient Instructions (Signed)
Stop Prednisone Increase Losartan to 25 mg twice a day Take Lasix 20 mg for the next 3 days and then only take when you need it Return to clinic in 1 mo with a 

## 2023-06-01 NOTE — Progress Notes (Signed)
ADVANCED HEART FAILURE CLINIC NOTE  Referring Physician: [name], MD  Primary Care: [name], MD Primary Cardiologist:  HPI: [name] is a 77 y.o. 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  [name] now has been followed by [name] for several years now.  He was diagnosed with heart failure at [facility] in 2004.  At that time he had an EF of 25% and also had an endomyocardial biopsy that was negative.  It was felt to be secondary to ventricular ectopy and he was subsequently started on amiodarone.  By 2019-2021 he had improvement in his EF to 35 to 40% however echocardiograms dating back to 2022 with reduction in EF to 25%.  Despite this he remained fairly well compensated and active.  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  Around August 2024 he began to have atrial tachyarrhythmias (atrial fibrillation versus flutter) requiring cardioversion.  He had recurrent atrial flutter/fibrillation leading to admission for decompensated heart failure in October 2024 with LFTs reaching the thousands and AKI with serum creatinine of 4.5.  He was started on inotropes, diuresed and eventually weaned off inotropes and LVAD evaluation was started.    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  Workup at that time and workup several months later in September 2020 consistent with alpha gal with IgE titer greater than 100.   History - Reports improvement in appetite; attempting to participate with PT more, however, still very inactive. Spends the majority of his time in his recliner as per his wife. Should be starting home PT this coming week.  - Started on prednisone taper by his PCP last week due to arthritis. He is hypervolemic today with 1-2+ edema  up to the midshins. N  Activity level/exercise tolerance:  NYHA III Orthopnea:  Sleeps on 2 pillows Paroxysmal noctural dyspnea:  no Chest pain/pressure:  no Orthostatic lightheadedness:  no Palpitations:  no Lower extremity edema:  yes, 1-2+ Presyncope/syncope:  no Cough:  no  Past Medical History:  Diagnosis Date   Abnormal chest CT    Anaphylactic reaction    Anxiety    Asthma    Benign hypertension    Cardiomyopathy    Cardiomyopathy, idiopathic (HCC)    CHF (congestive heart failure) (HCC)    Chronic renal insufficiency    With creatinine 1.4   Complication of anesthesia    Drug-induced urticaria    FH: thoracic aortic aneurysm    GERD (gastroesophageal reflux disease)    History of colon polyps    Hives    Long Hx of hives   Hyperlipidemia    Hyperlipidemia    IMPLANTATION OF DEFIBRILLATOR, HX OF 02/15/2009   Qualifier: Diagnosis of  By: Jens Som, MD, Lyn Hollingshead    NSVT (nonsustained ventricular tachycardia) (HCC)    Osteoarthritis    Osteoarthrosis, unspecified whether generalized or localized, lower leg    Osteoporosis    Oxygen deficiency    Psoriasis    PVC (premature ventricular contraction)    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    Current Outpatient Medications  Medication Sig Dispense Refill   amiodarone (PACERONE) 200 MG tablet Take 1 tablet (200 mg total) by mouth 2 (two) times daily for 10 days, THEN 1 tablet (200 mg total) daily. 110 tablet  0   apixaban (ELIQUIS) 5 MG TABS tablet Take 1 tablet (5 mg total) by mouth 2 (two) times daily. 60 tablet 2   atorvastatin (LIPITOR) 10 MG tablet Take 1 tablet (10 mg total) by mouth daily. 90 tablet 1   cetirizine (ZYRTEC) 10 MG tablet Take 10 mg by mouth daily.     digoxin (LANOXIN) 0.125 MG tablet Take 1 tablet (0.125 mg total) by mouth daily. 30 tablet 1   empagliflozin (JARDIANCE) 10 MG TABS tablet Take 1 tablet (10 mg total) by mouth daily. 30 tablet 2   EPINEPHrine 0.3 mg/0.3 mL IJ  SOAJ injection Inject 0.3 mg into the muscle as needed for anaphylaxis.     famotidine (PEPCID) 20 MG tablet Take 20 mg by mouth daily.     feeding supplement (ENSURE ENLIVE / ENSURE PLUS) LIQD Take 237 mLs by mouth 2 (two) times daily between meals.     furosemide (LASIX) 20 MG tablet Take 1 tablet (20 mg total) by mouth daily as needed. 30 tablet 1   losartan (COZAAR) 25 MG tablet Take 1 tablet (25 mg total) by mouth at bedtime. 30 tablet 2   Menthol-Methyl Salicylate (SALONPAS PAIN RELIEF PATCH) 3-10 % PTCH Apply 1 application  topically daily.     spironolactone (ALDACTONE) 25 MG tablet Take 1 tablet (25 mg total) by mouth daily. 30 tablet 2   No current facility-administered medications for this visit.    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   Pork-Derived Products Anaphylaxis   Ace Inhibitors Other (See Comments)    Other Reaction: Other reaction/ Urticaria secondary to COX-1 inhibitors Angioedema   Aspirin Other (See Comments)    Other Reaction: Other reaction   Indocin [Indomethacin] Other (See Comments)   Lactase-Lactobacillus Other (See Comments)   Lisinopril Other (See Comments)   Mobic [Meloxicam] Other (See Comments)   Naproxen Other (See Comments)   Nsaids Other (See Comments)    Other Reaction: Other reaction       Social History   Socioeconomic History   Marital status: Married    Spouse name: Not on file   Number of children: Not on file   Years of education: Not on file   Highest education level: Not on file  Occupational History   Not on file  Tobacco Use   Smoking status: Former    Current packs/day: 0.00    Average packs/day: 1 pack/day for 10.0 years (10.0 ttl pk-yrs)    Types: Cigarettes    Start date: 12/30/1960    Quit date: 12/31/1970    Years since quitting: 52.4   Smokeless tobacco: Never  Vaping Use   Vaping status: Never Used  Substance and Sexual Activity   Alcohol use: Yes     Alcohol/week: 0.0 standard drinks of alcohol    Comment: 2 DRINKS PER DAY, none this week (7/26)   Drug use: Never   Sexual activity: Not Currently  Other Topics Concern   Not on file  Social History Narrative   Not on file   Social Determinants of Health   Financial Resource Strain: Low Risk  (05/28/2023)   Received from [facility]   Overall Financial Resource Strain (CARDIA)    Difficulty of Paying Living Expenses: Not hard at all  Food Insecurity: No Food Insecurity (05/28/2023)   [facility]   Hunger Vital Sign    Worried About Running Out of Food in  the Last Year: Never true    Ran Out of Food in the Last Year: Never true  Transportation Needs: No Transportation Needs (05/28/2023)   Received from [facility] - Transportation    In the past 12 months, has lack of transportation kept you from medical appointments or from getting medications?: No    Lack of Transportation (Non-Medical): No  Physical Activity: Unknown (08/12/2018)   Exercise Vital Sign    Days of Exercise per Week: Patient declined    Minutes of Exercise per Session: Patient declined  Stress: No Stress Concern Present (08/12/2018)   [employer] of Occupational Health - Occupational Stress Questionnaire    Feeling of Stress : Not at all  Social Connections: Unknown (08/12/2018)   Social Connection and Isolation Panel [NHANES]    Frequency of Communication with Friends and Family: Patient declined    Frequency of Social Gatherings with Friends and Family: Patient declined    Attends Religious Services: Patient declined    Database administrator or Organizations: Patient declined    Attends Banker Meetings: Patient declined    Marital Status: Patient declined  Intimate Partner Violence: Not At Risk (05/16/2023)   Humiliation, Afraid, Rape, and Kick questionnaire    Fear of Current or Ex-Partner: No    Emotionally  Abused: No    Physically Abused: No    Sexually Abused: No      Family History  Problem Relation Age of Onset   Cancer Mother    Lung cancer Mother    Heart attack Father     PHYSICAL EXAM: There were no vitals filed for this visit. GENERAL: Well nourished, well developed, and in no apparent distress at rest.  HEENT: Negative for arcus senilis or xanthelasma. There is no scleral icterus.  The mucous membranes are pink and moist.   NECK: Supple, No masses. Normal carotid upstrokes without bruits. No masses or thyromegaly.    CHEST: There are no chest wall deformities. There is no chest wall tenderness. Respirations are unlabored.  Lungs- mild inspiratory crackles at bases CARDIAC:  JVP: 10 cm H2O         Normal S1, S2  Normal rate with regular rhythm. No murmurs, rubs or gallops.  Pulses are 2+ and symmetrical in upper and lower extremities. No edema.  ABDOMEN: Soft, non-tender, non-distended. There are no masses or hepatomegaly. There are normal bowel sounds.  EXTREMITIES: Warm and well perfused with no cyanosis, clubbing.  LYMPHATIC: No axillary or supraclavicular lymphadenopathy.  NEUROLOGIC: Patient is oriented x3 with no focal or lateralizing neurologic deficits.  PSYCH: Patients affect is appropriate, there is no evidence of anxiety or depression.  SKIN: Warm and dry; no lesions or wounds.   DATA REVIEW  ECG: 06/01/23: NSR as per my personal interpretation  ECHO: TEE (05/19/23): Severely reduced LVEF, mild AI.  05/18/23: LVEF <20%, mildly reduced RV function.  11/11/22: LVEF 25%-30%, normal RV function 04/26/23: LVEF 25%-30%, normal RV function 06/05/23: LVEF 35%-40%, normal RV function   ASSESSMENT & PLAN:  Heart failure with reduced ejection fraction Etiology of HF: Nonischemic dilated cardiomyopathy.  As noted above diagnosed in 2004 with negative endomyocardial biopsy at [facility]  LGE not consistent with any type of infiltrative cardiomyopathy. NYHA class / AHA  Stage:III-IV Volume status & Diuretics: hypervolemic; take lasix 20mg  daily for the next 3 days and then PRN only.  Vasodilators:losartan 25mg  daily Beta-Blocker:holding due to low output heart failure; continue digoxin. Obtain dig  level today.  NWG:NFAOZHYQ spironolactone 25mg  daily Cardiometabolic:jardiance 10mg  daily Devices therapies & Valvulopathies:Primary prevention ICD; Dr. Sharlot Gowda following Advanced therapies:Undergoing LVAD evaluation; current hurdles to LVAD implant are malnutrition/fraility & alpha gal. I have reached out to allergy/immunology, Lucienne Minks, [facility] & other academic institutes. We will plan on either heparin desensitization or Xolair prior to implant. Also we will pre-medicate with H1/H2 blockers + IV steroids prior to use of heparin.   2. 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  - 05/21/23: IgE total 1512, IgE pork 5.36, IgE Beef 30.50, alpha-gal >100 - discussed with multiple academic centers; see above.   3. Atrial fibrillation/flutter - DCCV during admission  - Diagnosed initially in July 2024 now s/p x2 DCCV  - EP following - Continue apixaban 5mg  BID  4. CKD  - Baseline sCr 1.6 - Repeat labs today  5. Malnutrition/cardiac cachexia - Re-addressed importance of improved nutrition and exercise - Refer to cardiac rehab  I spent 45 minutes caring for this patient today including face to face time, ordering and reviewing labs, reviewing records from hospitalization, seeing the patient, discussing options for LVAD/transplant and heparin desensitization, documenting in the record, and arranging follow ups.   Maylie Ashton Advanced Heart Failure Mechanical Circulatory Support

## 2023-06-04 ENCOUNTER — Ambulatory Visit (HOSPITAL_COMMUNITY): Payer: Medicare Other

## 2023-06-10 ENCOUNTER — Encounter: Payer: Self-pay | Admitting: Internal Medicine

## 2023-06-10 ENCOUNTER — Ambulatory Visit: Payer: Medicare Other | Admitting: Internal Medicine

## 2023-06-10 ENCOUNTER — Other Ambulatory Visit: Payer: Self-pay

## 2023-06-10 VITALS — BP 118/60 | HR 80 | Temp 98.2°F | Resp 24 | Ht 67.72 in | Wt 150.7 lb

## 2023-06-10 DIAGNOSIS — Z888 Allergy status to other drugs, medicaments and biological substances status: Secondary | ICD-10-CM | POA: Diagnosis not present

## 2023-06-10 DIAGNOSIS — I5023 Acute on chronic systolic (congestive) heart failure: Secondary | ICD-10-CM

## 2023-06-10 DIAGNOSIS — T781XXA Other adverse food reactions, not elsewhere classified, initial encounter: Secondary | ICD-10-CM

## 2023-06-10 DIAGNOSIS — T781XXD Other adverse food reactions, not elsewhere classified, subsequent encounter: Secondary | ICD-10-CM | POA: Diagnosis not present

## 2023-06-10 NOTE — Patient Instructions (Signed)
Alpha Gal Allergy Requirement for Heparin - Recommend desensitization in ICU prior to the procedure.  Would also premedicate with steroids and anti histamines.  Discussed possible risk of life threatening anaphylaxis despite this but desensitization would be the most safest way to mitigate the risk.   - Will discuss with Heart Failure team. - We are happy to see him back if they have any further questions.

## 2023-06-10 NOTE — Progress Notes (Addendum)
NEW PATIENT  Date of Service/Encounter:  06/10/23  Consult requested by: [name], MD   Subjective:   [name] (DOB: [birth year]) is a 77 y.o. male who presents to the clinic on 06/10/2023 with a chief complaint of Establish Care (Pt states he has alpha gal) .    History obtained from: chart review and patient and wife .   Reports about 6 years ago, he had an allergic reaction where he woke up with hives and then passed out.  EMS was called and reported low BP of 60/30s and was rushed to the hospital. Next day again had hives.  Initially had trouble with diagnosis because his symptoms occurred many hours after eating.  Eventually saw Allergy Dr Bluefield Callas who did testing and found he had alpha gal.  Since then has avoided all mammalian meat.  Has 5 acres of land and does note occasional tick bites throughout the year.   He is being evaluated by Dr Gasper Lloyd Heart Failure for LVAD.  Has severely reduced EF of 15-20%, mild AI and reduced RV function per TEE 05/2023. On losartan, spironolactone, digoxin, jardiance   Past Medical History: Past Medical History:  Diagnosis Date   Abnormal chest CT    Anaphylactic reaction    Anxiety    Asthma    Benign hypertension    Cardiomyopathy    Cardiomyopathy, idiopathic (HCC)    CHF (congestive heart failure) (HCC)    Chronic renal insufficiency    With creatinine 1.4   Complication of anesthesia    Drug-induced urticaria    FH: thoracic aortic aneurysm    GERD (gastroesophageal reflux disease)    History of colon polyps    Hives    Long Hx of hives   Hyperlipidemia    Hyperlipidemia    IMPLANTATION OF DEFIBRILLATOR, HX OF 02/15/2009   Qualifier: Diagnosis of  By: Jens Som, MD, Lyn Hollingshead    NSVT (nonsustained ventricular tachycardia) (HCC)    Osteoarthritis    Osteoarthrosis, unspecified whether generalized or localized, lower leg    Osteoporosis    Oxygen deficiency    Psoriasis    PVC (premature ventricular  contraction)    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    Past Surgical History: Past Surgical History:  Procedure Laterality Date   APPENDECTOMY     BIV ICD GENERTAOR CHANGE OUT N/A 10/08/2012   Procedure: BIV ICD GENERTAOR CHANGE OUT;  Surgeon: Marinus Maw, MD;  Location: [facility] CATH LAB;  Service: Cardiovascular;  Laterality: N/A;   CARDIAC CATHETERIZATION     CARDIAC DEFIBRILLATOR REMOVAL  2014   CARDIOVERSION N/A 04/07/2023   Procedure: CARDIOVERSION;  Surgeon: Chilton Si, MD;  Location: [facility] INVASIVE CV LAB;  Service: Cardiovascular;  Laterality: N/A;   CATARACT EXTRACTION     CATARACT EXTRACTION W/PHACO Left 03/12/2016   Procedure: CATARACT EXTRACTION PHACO AND INTRAOCULAR LENS PLACEMENT (IOC);  Surgeon: Sallee Lange, MD;  Location: [facility] ORS;  Service: Ophthalmology;  Laterality: Left;  [place] 01:32 AP% 25.7 CDE 41.33 fluid pack lot # [id number]   COLONOSCOPY WITH PROPOFOL N/A 04/26/2019   Procedure: COLONOSCOPY WITH PROPOFOL;  Surgeon: Christena Deem, MD;  Location: [facility] ENDOSCOPY;  Service: Endoscopy;  Laterality: N/A;   ESOPHAGOGASTRODUODENOSCOPY     EYE SURGERY     Implantation of dual-chamber ICD.     [employer] Jude DDD-ICD 1/07   JOINT REPLACEMENT Right 08/16/2009   KNEE ARTHROPLASTY Left 2006   KNEE ARTHROPLASTY Right 12/10  x2   LAPAROSCOPIC APPENDECTOMY N/A 08/12/2018   Procedure: APPENDECTOMY LAPAROSCOPIC;  Surgeon: Henrene Dodge, MD;  Location: [facility] ORS;  Service: General;  Laterality: N/A;   TONSILLECTOMY      Family History: Family History  Problem Relation Age of Onset   Cancer Mother    Lung cancer Mother    Heart attack Father     Medication List:  Allergies as of 06/10/2023       Reactions   Beef Allergy Anaphylaxis   Meat [alpha-gal] Anaphylaxis   Pork-derived Products Anaphylaxis   Ace Inhibitors Other (See Comments)   Other Reaction: Other reaction/ Urticaria secondary to COX-1 inhibitors Angioedema   Aspirin Other (See Comments)    Other Reaction: Other reaction   Indocin [indomethacin] Other (See Comments)   Lactase-lactobacillus Other (See Comments)   Lisinopril Other (See Comments)   Mobic [meloxicam] Other (See Comments)   Naproxen Other (See Comments)   Nsaids Other (See Comments)   Other Reaction: Other reaction        Medication List        Accurate as of June 10, 2023 12:23 PM. If you have any questions, ask your nurse or doctor.          amiodarone 200 MG tablet Commonly known as: PACERONE Take 1 tablet (200 mg total) by mouth 2 (two) times daily for 10 days, THEN 1 tablet (200 mg total) daily. Start taking on: May 24, 2023   apixaban 5 MG Tabs tablet Commonly known as: ELIQUIS Take 1 tablet (5 mg total) by mouth 2 (two) times daily.   atorvastatin 10 MG tablet Commonly known as: LIPITOR Take 1 tablet (10 mg total) by mouth daily.   cetirizine 10 MG tablet Commonly known as: ZYRTEC Take 10 mg by mouth daily.   digoxin 0.125 MG tablet Commonly known as: LANOXIN Take 1 tablet (0.125 mg total) by mouth daily.   empagliflozin 10 MG Tabs tablet Commonly known as: JARDIANCE Take 1 tablet (10 mg total) by mouth daily.   EPINEPHrine 0.3 mg/0.3 mL Soaj injection Commonly known as: EPI-PEN Inject 0.3 mg into the muscle as needed for anaphylaxis.   famotidine 20 MG tablet Commonly known as: PEPCID Take 20 mg by mouth daily.   feeding supplement Liqd Take 237 mLs by mouth 2 (two) times daily between meals.   furosemide 20 MG tablet Commonly known as: Lasix Take 1 tablet (20 mg total) by mouth daily as needed.   losartan 25 MG tablet Commonly known as: COZAAR Take 1 tablet (25 mg total) by mouth 2 (two) times daily.   Salonpas Pain Relief Patch 3-10 % Ptch Apply 1 application  topically daily.   spironolactone 25 MG tablet Commonly known as: ALDACTONE Take 1 tablet (25 mg total) by mouth daily.         REVIEW OF SYSTEMS: Pertinent positives and negatives discussed  in HPI.   Objective:   Physical Exam: BP 118/60   Pulse 80   Temp 98.2 F (36.8 C)   Resp (!) 24   Ht 5' 7.72" (1.72 m)   Wt 150 lb 11.2 oz (68.4 kg)   SpO2 99%   BMI 23.11 kg/m  Body mass index is 23.11 kg/m. GEN: alert, using walker HEENT: clear conjunctiva, MMM HEART: regular rate and rhythm LUNGS: clear to auscultation bilaterally, no coughing, unlabored respiration ABDOMEN: soft, non distended  SKIN: no rashes or lesions  Reviewed:  See HPI   Assessment:   1. Acute on chronic HFrEF (  heart failure with reduced ejection fraction) (HCC)   2. Allergic reaction to alpha-gal   3. Heparin allergy     Plan/Recommendations:  Alpha Gal Syndrome  Heparin Requirement for LVAD due to End Stage Heart Failure  - Initial rxn: hives, LOC, hypotension- anaphylaxis  - 05/31/2023: alpha gal IgE >100 - Alternatives like bivalirudin and argatroban were considered; however, these are not likely possibilities for use as they have no reversal agent and come with high risk for bleeds. Therefore heparin remains the choice of anticoagulation.   - With his high alpha gal level (over 50 which places him at a high risk for anaphylaxis to heparin) and severe heart failure, recommend desensitization to heparin in ICU prior to procedure.  There is still a risk of life threatening anaphylaxis with exposure to heparin but desensitization would be the safest way to lower that risk.   Did discuss there is no standardized protocol for this so it would be done based on case reports. Both multi day and rapid protocols have been attempted at other universities; rapid one day desensitization protocols for most drugs have generally been proven to be safe in literature.  Will attach information for the protocol used at [facility] for heparin desensitization for CABG in alpha gal patient.   - Please make anesthesia team is aware of his alpha gal allergy and if he has any hemodynamic instability with  heparin bolus, anaphylaxis should remain high on differential and treated with Epi.  - Can continue heparin 5000IU SQ every 8 hours after surgery, if there is any concern for need for further heparin, in order to maintain desensitized state.  - Would also premedicate prior to procedure with hydrocortisone 1.5mg /kg IV (max 100mg ) x1 (about an hour before procedure), benadryl 50mg  IV x1 (about an hour before procedure), Zyrtec tablet or liquid 20mg  BID (start day prior), Pepcid tablet or liquid 40mg  BID (start day prior).  Avoid gelatin capsules.  - Discussed with heart failure team. Happy to see him back if he has further questions. Wife was hesitant for going through with this but we also discussed that he had severe end stage heart failure and the Heart Failure team is concerned for high mortality risk without an LVAD.    Heparin Protocol: McRae AS, Tidwell WP, Kathaleen Dudziak S, Lombard FW. Heparin desensitisation prior to cardiopulmonary bypass in a patient with alpha-gal allergy. Anaesth Rep. 2022 Dec 16;10(2):e12203. doi: 10.1002/anr3.12203. PMID: 16109604; PMCID: [id number].    Return if symptoms worsen or fail to improve.  Alesia Morin, MD [facility]

## 2023-06-15 ENCOUNTER — Other Ambulatory Visit: Payer: Self-pay

## 2023-06-15 MED ORDER — DIGOXIN 125 MCG PO TABS: 0.1250 mg | ORAL_TABLET | Freq: Every day | ORAL | 3 refills | Status: DC

## 2023-06-30 NOTE — Progress Notes (Unsigned)
  Electrophysiology Office Note:    Date:  07/01/2023   ID:  [name], DOB Mar 05, 1946, MRN [id number]  [facility] Cardiologist:  Olga Millers, MD  [facility] Electrophysiologist:  Lanier Prude, MD   Referring MD: Lewayne Bunting, MD   Chief Complaint: Chronic systolic heart failure and atrial fibrillation  History of Present Illness:    [name] is a 77 y.o. malewho I am seeing today for an evaluation of chronic systolic heart failure and atrial fibrillation at the request of [name].  The patient was last seen by Dr. Lucien Mons when hospitalized in September 2024.  He was hospitalized from September 27 through May 24, 2023.  The patient has a medical history that includes chronic systolic heart failure due to nonischemic cardiomyopathy, persistent atrial fibrillation, alpha gal, hypertension, hyperlipidemia, thoracic aortic aneurysm.  His atrial fibrillation was diagnosed in September 2024.  He required milrinone during his hospitalization recently.  Lasix and amnio drips were used along with Levophed.  He takes Eliquis as an outpatient for stroke prophylaxis.  He is pending workup for LVAD.          Their past medical, social and family history was reveiwed.   ROS:   Please see the history of present illness.    All other systems reviewed and are negative.  EKGs/Labs/Other Studies Reviewed:    The following studies were reviewed today:  May 18, 2023 echo EF less than 20% RV function mildly reduced Mildly dilated left atrium Mild MR  May 24, 2023 EKG shows sinus rhythm, QRS duration 128 ms  May 14, 2023 EKG shows atrial flutter with a ventricular rate in the 90s      Physical Exam:    VS:  BP (!) 112/56 (BP Location: Right Arm, Patient Position: Sitting, Cuff Size: Normal)   Ht 5\' 8"  (1.727 m)   Wt 148 lb (67.1 kg)   SpO2 94%   BMI 22.50 kg/m     Wt Readings from Last 3 Encounters:  07/01/23 148 lb (67.1  kg)  06/10/23 150 lb 11.2 oz (68.4 kg)  05/24/23 164 lb 10.9 oz (74.7 kg)     GEN: chronically ill appearing, no distress\ CARDIAC: RRR, no murmurs, rubs, gallops RESPIRATORY:  Clear to auscultation without rales, wheezing or rhonchi       ASSESSMENT AND PLAN:    1. Persistent atrial fibrillation (HCC)   2. Chronic systolic heart failure (HCC)   3. Encounter for long-term (current) use of high-risk medication     #Persistent atrial fibrillation and flutter #High risk drug monitoring-amiodarone  associated with severe LV dysfunction Rhythm control indicated Continue amiodarone He is a poor candidate for catheter ablation given his severe/advanced comorbidities Recommend continuing amiodarone while he undergoes workup for possible LVAD Could consider maze at the time of LVAD implantation if he ultimately receives this therapy    #Chronic systolic heart failure NYHA class IV.  Warm and euvolemic on exam today Continue digoxin, Jardiance, Lasix, losartan, and spironolactone Rhythm control indicated as above  Follow-up with EP APP in 6 months.     Signed, Rossie Muskrat. Lalla Brothers, MD, [facility], [facility] 07/01/2023 1:47 PM    Electrophysiology [facility]

## 2023-07-01 ENCOUNTER — Encounter: Payer: Self-pay | Admitting: Cardiology

## 2023-07-01 ENCOUNTER — Ambulatory Visit: Payer: Medicare Other | Attending: Cardiology | Admitting: Cardiology

## 2023-07-01 ENCOUNTER — Other Ambulatory Visit (HOSPITAL_COMMUNITY): Payer: Self-pay | Admitting: *Deleted

## 2023-07-01 VITALS — BP 112/56 | Ht 68.0 in | Wt 148.0 lb

## 2023-07-01 DIAGNOSIS — Z79899 Other long term (current) drug therapy: Secondary | ICD-10-CM

## 2023-07-01 DIAGNOSIS — I428 Other cardiomyopathies: Secondary | ICD-10-CM

## 2023-07-01 DIAGNOSIS — I5022 Chronic systolic (congestive) heart failure: Secondary | ICD-10-CM | POA: Diagnosis not present

## 2023-07-01 DIAGNOSIS — I4819 Other persistent atrial fibrillation: Secondary | ICD-10-CM

## 2023-07-01 DIAGNOSIS — I5021 Acute systolic (congestive) heart failure: Secondary | ICD-10-CM

## 2023-07-01 NOTE — Patient Instructions (Signed)
 Medication Instructions:  Your physician recommends that you continue on your current medications as directed. Please refer to the Current Medication list given to you today.  *If you need a refill on your cardiac medications before your next appointment, please call your pharmacy*  Follow-Up: At [facility], you and your health needs are our priority.  As part of our continuing mission to provide you with exceptional heart care, we have created designated Provider Care Teams.  These Care Teams include your primary Cardiologist (physician) and Advanced Practice Providers (APPs -  Physician Assistants and Nurse Practitioners) who all work together to provide you with the care you need, when you need it.  Your next appointment:   6 months  Provider:   You may see Lanier Prude, MD or one of the following Advanced Practice Providers on your designated Care Team:   Francis Dowse, [place] [address] [place], [place] Sherie Don, NP Canary Brim, NP

## 2023-07-02 ENCOUNTER — Telehealth (HOSPITAL_COMMUNITY): Payer: Self-pay

## 2023-07-02 ENCOUNTER — Ambulatory Visit (HOSPITAL_COMMUNITY)
Admission: RE | Admit: 2023-07-02 | Discharge: 2023-07-02 | Disposition: A | Discharge status: Home / Self Care | Payer: Medicare Other | Source: Ambulatory Visit | Attending: Cardiology | Admitting: Cardiology

## 2023-07-02 VITALS — BP 119/54 | HR 84 | Ht 67.0 in | Wt 149.2 lb

## 2023-07-02 DIAGNOSIS — R54 Age-related physical debility: Secondary | ICD-10-CM | POA: Insufficient documentation

## 2023-07-02 DIAGNOSIS — N189 Chronic kidney disease, unspecified: Secondary | ICD-10-CM | POA: Insufficient documentation

## 2023-07-02 DIAGNOSIS — E46 Unspecified protein-calorie malnutrition: Secondary | ICD-10-CM | POA: Insufficient documentation

## 2023-07-02 DIAGNOSIS — R627 Adult failure to thrive: Secondary | ICD-10-CM | POA: Insufficient documentation

## 2023-07-02 DIAGNOSIS — I428 Other cardiomyopathies: Secondary | ICD-10-CM

## 2023-07-02 DIAGNOSIS — I4892 Unspecified atrial flutter: Secondary | ICD-10-CM | POA: Insufficient documentation

## 2023-07-02 DIAGNOSIS — Z91018 Allergy to other foods: Secondary | ICD-10-CM

## 2023-07-02 DIAGNOSIS — I130 Hypertensive heart and chronic kidney disease with heart failure and stage 1 through stage 4 chronic kidney disease, or unspecified chronic kidney disease: Secondary | ICD-10-CM | POA: Diagnosis present

## 2023-07-02 DIAGNOSIS — I5022 Chronic systolic (congestive) heart failure: Secondary | ICD-10-CM | POA: Diagnosis not present

## 2023-07-02 DIAGNOSIS — E88A Wasting disease (syndrome) due to underlying condition: Secondary | ICD-10-CM | POA: Diagnosis not present

## 2023-07-02 DIAGNOSIS — I509 Heart failure, unspecified: Secondary | ICD-10-CM | POA: Insufficient documentation

## 2023-07-02 DIAGNOSIS — Z79899 Other long term (current) drug therapy: Secondary | ICD-10-CM | POA: Diagnosis not present

## 2023-07-02 DIAGNOSIS — F419 Anxiety disorder, unspecified: Secondary | ICD-10-CM | POA: Diagnosis not present

## 2023-07-02 DIAGNOSIS — I480 Paroxysmal atrial fibrillation: Secondary | ICD-10-CM | POA: Diagnosis not present

## 2023-07-02 DIAGNOSIS — Z888 Allergy status to other drugs, medicaments and biological substances status: Secondary | ICD-10-CM | POA: Diagnosis not present

## 2023-07-02 DIAGNOSIS — E785 Hyperlipidemia, unspecified: Secondary | ICD-10-CM | POA: Insufficient documentation

## 2023-07-02 DIAGNOSIS — I712 Thoracic aortic aneurysm, without rupture, unspecified: Secondary | ICD-10-CM | POA: Diagnosis not present

## 2023-07-02 DIAGNOSIS — Z7901 Long term (current) use of anticoagulants: Secondary | ICD-10-CM | POA: Insufficient documentation

## 2023-07-02 DIAGNOSIS — I519 Heart disease, unspecified: Secondary | ICD-10-CM

## 2023-07-02 DIAGNOSIS — I4819 Other persistent atrial fibrillation: Secondary | ICD-10-CM

## 2023-07-02 DIAGNOSIS — I420 Dilated cardiomyopathy: Secondary | ICD-10-CM | POA: Insufficient documentation

## 2023-07-02 DIAGNOSIS — R252 Cramp and spasm: Secondary | ICD-10-CM

## 2023-07-02 LAB — CBC
HCT: 40.7 % (ref 39.0–52.0)
Hemoglobin: 13.4 g/dL (ref 13.0–17.0)
MCH: 30.3 pg (ref 26.0–34.0)
MCHC: 32.9 g/dL (ref 30.0–36.0)
MCV: 92.1 fL (ref 80.0–100.0)
Platelets: 322 10*3/uL (ref 150–400)
RBC: 4.42 MIL/uL (ref 4.22–5.81)
RDW: 14.2 % (ref 11.5–15.5)
WBC: 13 10*3/uL — ABNORMAL HIGH (ref 4.0–10.5)
nRBC: 0 % (ref 0.0–0.2)

## 2023-07-02 LAB — PULMONARY FUNCTION TEST
DL/VA % pred: 57 %
DL/VA: 2.3 ml/min/mmHg/L
DLCO cor % pred: 41 %
DLCO cor: 9.83 ml/min/mmHg
DLCO unc % pred: 40 %
DLCO unc: 9.48 ml/min/mmHg
FEF 25-75 Pre: 1.23 L/s
FEF2575-%Pred-Pre: 60 %
FEV1-%Pred-Pre: 75 %
FEV1-Pre: 2.11 L
FEV1FVC-%Pred-Pre: 88 %
FEV6-%Pred-Pre: 88 %
FEV6-Pre: 3.24 L
FEV6FVC-%Pred-Pre: 105 %
FVC-%Pred-Pre: 83 %
FVC-Pre: 3.28 L
Pre FEV1/FVC ratio: 64 %
Pre FEV6/FVC Ratio: 99 %
RV % pred: 82 %
RV: 2.01 L
TLC % pred: 78 %
TLC: 5.23 L

## 2023-07-02 LAB — CK: Total CK: 28 U/L — ABNORMAL LOW (ref 49–397)

## 2023-07-02 LAB — COMPREHENSIVE METABOLIC PANEL
ALT: 70 U/L — ABNORMAL HIGH (ref 0–44)
AST: 45 U/L — ABNORMAL HIGH (ref 15–41)
Albumin: 2.5 g/dL — ABNORMAL LOW (ref 3.5–5.0)
Alkaline Phosphatase: 107 U/L (ref 38–126)
Anion gap: 9 (ref 5–15)
BUN: 54 mg/dL — ABNORMAL HIGH (ref 8–23)
CO2: 21 mmol/L — ABNORMAL LOW (ref 22–32)
Calcium: 9.4 mg/dL (ref 8.9–10.3)
Chloride: 96 mmol/L — ABNORMAL LOW (ref 98–111)
Creatinine, Ser: 2.06 mg/dL — ABNORMAL HIGH (ref 0.61–1.24)
GFR, Estimated: 33 mL/min — ABNORMAL LOW (ref >60)
Glucose, Bld: 116 mg/dL — ABNORMAL HIGH (ref 70–99)
Potassium: 5 mmol/L (ref 3.5–5.1)
Sodium: 126 mmol/L — ABNORMAL LOW (ref 135–145)
Total Bilirubin: 1.2 mg/dL — ABNORMAL HIGH (ref <1.2)
Total Protein: 6.7 g/dL (ref 6.5–8.1)

## 2023-07-02 LAB — PREALBUMIN: Prealbumin: 16 mg/dL — ABNORMAL LOW (ref 18–38)

## 2023-07-02 LAB — BRAIN NATRIURETIC PEPTIDE: B Natriuretic Peptide: 1098.6 pg/mL — ABNORMAL HIGH (ref 0.0–100.0)

## 2023-07-02 LAB — DIGOXIN LEVEL: Digoxin Level: 2.8 ng/mL (ref 0.8–2.0)

## 2023-07-02 MED ORDER — MIRTAZAPINE 15 MG PO TABS: 15.0000 mg | ORAL_TABLET | Freq: Every day | ORAL | 3 refills | Status: DC

## 2023-07-02 MED ORDER — ATORVASTATIN CALCIUM 10 MG PO TABS: 10.0000 mg | ORAL_TABLET | ORAL | 1 refills | Status: AC

## 2023-07-02 MED ORDER — AMIODARONE HCL 200 MG PO TABS: 400.0000 mg | ORAL_TABLET | Freq: Every day | ORAL | 3 refills | Status: DC

## 2023-07-02 MED ORDER — LOSARTAN POTASSIUM 25 MG PO TABS: 12.5000 mg | ORAL_TABLET | Freq: Every day | ORAL | 2 refills | Status: DC

## 2023-07-02 NOTE — H&P (View-Only) (Signed)
 ADVANCED HEART FAILURE CLINIC NOTE  Referring Physician: [name], MD  Primary Care: [name], MD Primary Cardiologist:  HPI: [name] is a 77 y.o. 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  [name] now has been followed by [name] for several years now.  He was diagnosed with heart failure at [facility] in 2004.  At that time he had an EF of 25% and also had an endomyocardial biopsy that was negative.  It was felt to be secondary to ventricular ectopy and he was subsequently started on amiodarone.  By 2019-2021 he had improvement in his EF to 35 to 40% however echocardiograms dating back to 2022 with reduction in EF to 25%.  Despite this he remained fairly well compensated and active.  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  Around August 2024 he began to have atrial tachyarrhythmias (atrial fibrillation versus flutter) requiring cardioversion.  He had recurrent atrial flutter/fibrillation leading to admission for decompensated heart failure in October 2024 with LFTs reaching the thousands and AKI with serum creatinine of 4.5.  He was started on inotropes, diuresed and eventually weaned off inotropes and LVAD evaluation was started.    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  Workup at that time and workup several months later in September 2020 consistent with alpha gal with IgE titer greater than 100.   History - Unfortunately he has not made significant strides in terms of physical therapy or improvement in frailty. He continues to become very quickly decompensated with worsening fatigue/malaise. His appetite also remains very poor and he has continued to lose weight. He has now been seen by allergy/immunology & EP.  - His  biggest complaint today is severe cramping in the lower extremities with touch or minimal exertion.   Activity level/exercise tolerance:  NYHA III-IV Orthopnea:  Sleeps on 2 pillows Paroxysmal noctural dyspnea:  no Chest pain/pressure:  no Orthostatic lightheadedness:  no Palpitations:  no Lower extremity edema:  yes, 1+ Presyncope/syncope:  no Cough:  yes  Past Medical History:  Diagnosis Date   Abnormal chest CT    Anaphylactic reaction    Anxiety    Asthma    Benign hypertension    Cardiomyopathy    Cardiomyopathy, idiopathic (HCC)    CHF (congestive heart failure) (HCC)    Chronic renal insufficiency    With creatinine 1.4   Complication of anesthesia    Drug-induced urticaria    FH: thoracic aortic aneurysm    GERD (gastroesophageal reflux disease)    History of colon polyps    Hives    Long Hx of hives   Hyperlipidemia    Hyperlipidemia    IMPLANTATION OF DEFIBRILLATOR, HX OF 02/15/2009   Qualifier: Diagnosis of  By: Jens Som, MD, Lyn Hollingshead    NSVT (nonsustained ventricular tachycardia) (HCC)    Osteoarthritis    Osteoarthrosis, unspecified whether generalized or localized, lower leg    Osteoporosis    Oxygen deficiency    Psoriasis    PVC (premature ventricular contraction)    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 90 tablet 3   apixaban (ELIQUIS) 5 MG  TABS tablet Take 1 tablet (5 mg total) by mouth 2 (two) times daily. 60 tablet 2   digoxin (LANOXIN) 0.125 MG tablet Take 1 tablet (0.125 mg total) by mouth daily. 90 tablet 3   empagliflozin (JARDIANCE) 10 MG TABS tablet Take 1 tablet (10 mg total) by mouth daily. 30 tablet 2   famotidine (PEPCID) 20 MG tablet Take 20 mg by mouth daily.     feeding supplement (ENSURE ENLIVE / ENSURE PLUS) LIQD Take 237 mLs by mouth 2 (two) times daily between  meals.     mirtazapine (REMERON) 15 MG tablet Take 1 tablet (15 mg total) by mouth at bedtime. 30 tablet 3   spironolactone (ALDACTONE) 25 MG tablet Take 1 tablet (25 mg total) by mouth daily. 30 tablet 2   atorvastatin (LIPITOR) 10 MG tablet Take 1 tablet (10 mg total) by mouth every other day. 90 tablet 1   cetirizine (ZYRTEC) 10 MG tablet Take 10 mg by mouth daily. (Patient not taking: Reported on 07/02/2023)     EPINEPHrine 0.3 mg/0.3 mL IJ SOAJ injection Inject 0.3 mg into the muscle as needed for anaphylaxis. (Patient not taking: Reported on 07/02/2023)     furosemide (LASIX) 20 MG tablet Take 1 tablet (20 mg total) by mouth daily as needed. (Patient not taking: Reported on 07/02/2023) 30 tablet 1   losartan (COZAAR) 25 MG tablet Take 0.5 tablets (12.5 mg total) by mouth at bedtime. 30 tablet 2   Menthol-Methyl Salicylate (SALONPAS PAIN RELIEF PATCH) 3-10 % PTCH Apply 1 application  topically daily. (Patient not taking: Reported on 07/02/2023)     No current facility-administered medications for this encounter.    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   Pork-Derived Products Anaphylaxis   Ace Inhibitors Other (See Comments)    Other Reaction: Other reaction/ Urticaria secondary to COX-1 inhibitors Angioedema   Aspirin Other (See Comments)    Other Reaction: Other reaction   Bacid Other (See Comments)   Indocin [Indomethacin] Other (See Comments)   Lisinopril Other (See Comments)   Mobic [Meloxicam] Other (See Comments)   Naproxen Other (See Comments)   Nsaids Other (See Comments)    Other Reaction: Other reaction       Social History   Socioeconomic History   Marital status: Married    Spouse name: Not on file   Number of children: Not on file   Years of education: Not on file   Highest education level: Not on file  Occupational History   Not on file  Tobacco Use   Smoking status: Former    Current packs/day: 0.00     Average packs/day: 1 pack/day for 10.0 years (10.0 ttl pk-yrs)    Types: Cigarettes    Start date: 12/30/1960    Quit date: 12/31/1970    Years since quitting: 52.5    Passive exposure: Past   Smokeless tobacco: Never  Vaping Use   Vaping status: Never Used  Substance and Sexual Activity   Alcohol use: Yes    Alcohol/week: 0.0 standard drinks of alcohol    Comment: 2 DRINKS PER DAY, none this week (7/26)   Drug use: Never   Sexual activity: Not Currently  Other Topics Concern   Not on file  Social History Narrative   Not on file   Social Determinants of Health   Financial Resource Strain: Low Risk  (05/28/2023)   Received from [facility]   Overall Financial  Resource Strain (CARDIA)    Difficulty of Paying Living Expenses: Not hard at all  Food Insecurity: No Food Insecurity (05/28/2023)   Received from [facility]   Hunger Vital Sign    Worried About Running Out of Food in the Last Year: Never true    Ran Out of Food in the Last Year: Never true  Transportation Needs: No Transportation Needs (05/28/2023)   Received from [facility] - Transportation    In the past 12 months, has lack of transportation kept you from medical appointments or from getting medications?: No    Lack of Transportation (Non-Medical): No  Physical Activity: Unknown (08/12/2018)   Exercise Vital Sign    Days of Exercise per Week: Patient declined    Minutes of Exercise per Session: Patient declined  Stress: No Stress Concern Present (08/12/2018)   [employer] of Occupational Health - Occupational Stress Questionnaire    Feeling of Stress : Not at all  Social Connections: Unknown (08/12/2018)   Social Connection and Isolation Panel [NHANES]    Frequency of Communication with Friends and Family: Patient declined    Frequency of Social Gatherings with Friends and Family: Patient declined    Attends Religious Services: Patient  declined    Database administrator or Organizations: Patient declined    Attends Banker Meetings: Patient declined    Marital Status: Patient declined  Intimate Partner Violence: Not At Risk (05/16/2023)   Humiliation, Afraid, Rape, and Kick questionnaire    Fear of Current or Ex-Partner: No    Emotionally Abused: No    Physically Abused: No    Sexually Abused: No      Family History  Problem Relation Age of Onset   Cancer Mother    Lung cancer Mother    Heart attack Father     PHYSICAL EXAM: Vitals:   07/02/23 1116 07/02/23 1117  BP: (!) 118/59 (!) 119/54  Pulse:    SpO2:     GENERAL: Well nourished, well developed, and in no apparent distress at rest.  HEENT: Negative for arcus senilis or xanthelasma. There is no scleral icterus.  The mucous membranes are pink and moist.   NECK: Supple, No masses. Normal carotid upstrokes without bruits. No masses or thyromegaly.    CHEST: There are no chest wall deformities. There is no chest wall tenderness. Respirations are unlabored.  Lungs- CTA B/L CARDIAC:  JVP: 5 cm          Normal rate with regular rhythm. No murmurs, rubs or gallops.  Pulses are 2+ and symmetrical in upper and lower extremities. Mild pretibial edema.  ABDOMEN: Soft, non-tender, non-distended. There are no masses or hepatomegaly. There are normal bowel sounds.  EXTREMITIES: lower extremities cool to touch; calf very tender to palpation bilaterally LYMPHATIC: No axillary or supraclavicular lymphadenopathy.  NEUROLOGIC: Patient is oriented x3 with no focal or lateralizing neurologic deficits.  PSYCH: Patients affect is appropriate, there is no evidence of anxiety or depression.  SKIN: Warm and dry; no lesions or wounds.    DATA REVIEW  ECG: 06/01/23: NSR as per my personal interpretation  ECHO: TEE (05/19/23): Severely reduced LVEF, mild AI.  05/18/23: LVEF <20%, mildly reduced RV function.  11/11/22: LVEF 25%-30%, normal RV function 04/26/23: LVEF  25%-30%, normal RV function 06/05/23: LVEF 35%-40%, normal RV function   ASSESSMENT & PLAN:  Heart failure with reduced ejection fraction Etiology of HF: Nonischemic dilated cardiomyopathy.  As noted above  diagnosed in 2004 with negative endomyocardial biopsy at [facility].  LGE not consistent with any type of infiltrative cardiomyopathy. NYHA class / AHA Stage:IV Volume status & Diuretics: euvolemic, continue lasix PRN only. .  Vasodilators: decrease to 12.5mg  qHS Beta-Blocker:holding due to low output heart failure; continue digoxin. Repeat dig level today ZDG:[id number] spironolactone 25mg  daily Cardiometabolic:jardiance 10mg  daily Devices therapies & Valvulopathies:Primary prevention ICD; Dr. Sharlot Gowda following Advanced therapies:Undergoing LVAD evaluation; current hurdles to LVAD implant are malnutrition/fraility & alpha gal. I have reached out to allergy/immunology, Lucienne Minks, [facility] & other academic institutes. We will plan on either heparin desensitization or Xolair prior to implant. Also we will pre-medicate with H1/H2 blockers + IV steroids prior to use of heparin.  07/02/23: seen by allergy/immunology; if he were to proceed with LVAD we have a plan in place for heparin desensitization. Unfortunately, remains too frail for VAD placement. After extensive discussion and concern for low output heart failure will plan on milrinone initiation with RHC next week followed by inpatient admission.   2. 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  - 05/21/23: IgE total 1512, IgE pork 5.36, IgE Beef 30.50, alpha-gal >100 - discussed with multiple academic centers; see above.  - seen by Allergy/Immunology; will plan on hep desensitization  if we proceed with VAD  3. Atrial fibrillation/flutter - DCCV during admission  - Diagnosed initially in July 2024 now s/p x2 DCCV  - EP following - Continue apixaban 5mg  BID - Now back in rate  controlled atrial fibrillation; increase amio to 400mg . Plan on DCCV while inpatient next week.   4. CKD  - Baseline sCr 1.6 - Repeat labs today  5. Malnutrition/cardiac cachexia - Re-addressed importance of improved nutrition and exercise - start milrinone.  - start remeron 15mg  at bedtime for anxiety/lack of appetite.   6. Lower extremity cramping - performed quick bedside ABIs today which were negative for PAD.  - Likely secondary to low output heart failure - decrease lipitor to 10mg  every other day.   7. Failure to thrive/anxiety - Start remeron 15mg  qHS  I spent 45-50 minutes caring for this patient today including face to face time, ordering and reviewing labs, reviewing records from allergy/immunology (06/10/23), discussing risk/benefits of RHC, discussing milrinone initiation and advanced therapies, reviewing EP notes (07/01/23), seeing the patient, documenting in the record, and arranging follow ups.   Marliss Buttacavoli Advanced Heart Failure Mechanical Circulatory Support

## 2023-07-02 NOTE — Addendum Note (Signed)
Encounter addended by: Lebron Quam, RN on: 07/02/2023 2:41 PM  Actions taken: Charge Capture section accepted

## 2023-07-02 NOTE — Telephone Encounter (Signed)
Received critical result Digoxin 2.8- will forward to VAD clinic/Dr. Sabharwal.

## 2023-07-02 NOTE — Progress Notes (Signed)
ADVANCED HEART FAILURE CLINIC NOTE  Referring Physician: [name], MD  Primary Care: [name], MD Primary Cardiologist:  HPI: [name] is a 77 y.o. 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  [name] now has been followed by [name] for several years now.  He was diagnosed with heart failure at [facility] in 2004.  At that time he had an EF of 25% and also had an endomyocardial biopsy that was negative.  It was felt to be secondary to ventricular ectopy and he was subsequently started on amiodarone.  By 2019-2021 he had improvement in his EF to 35 to 40% however echocardiograms dating back to 2022 with reduction in EF to 25%.  Despite this he remained fairly well compensated and active.  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  Around August 2024 he began to have atrial tachyarrhythmias (atrial fibrillation versus flutter) requiring cardioversion.  He had recurrent atrial flutter/fibrillation leading to admission for decompensated heart failure in October 2024 with LFTs reaching the thousands and AKI with serum creatinine of 4.5.  He was started on inotropes, diuresed and eventually weaned off inotropes and LVAD evaluation was started.    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  Workup at that time and workup several months later in September 2020 consistent with alpha gal with IgE titer greater than 100.   History - Unfortunately he has not made significant strides in terms of physical therapy or improvement in frailty. He continues to become very quickly decompensated with worsening fatigue/malaise. His appetite also remains very poor and he has continued to lose weight. He has now been seen by allergy/immunology & EP.  - His  biggest complaint today is severe cramping in the lower extremities with touch or minimal exertion.   Activity level/exercise tolerance:  NYHA III-IV Orthopnea:  Sleeps on 2 pillows Paroxysmal noctural dyspnea:  no Chest pain/pressure:  no Orthostatic lightheadedness:  no Palpitations:  no Lower extremity edema:  yes, 1+ Presyncope/syncope:  no Cough:  yes  Past Medical History:  Diagnosis Date   Abnormal chest CT    Anaphylactic reaction    Anxiety    Asthma    Benign hypertension    Cardiomyopathy    Cardiomyopathy, idiopathic (HCC)    CHF (congestive heart failure) (HCC)    Chronic renal insufficiency    With creatinine 1.4   Complication of anesthesia    Drug-induced urticaria    FH: thoracic aortic aneurysm    GERD (gastroesophageal reflux disease)    History of colon polyps    Hives    Long Hx of hives   Hyperlipidemia    Hyperlipidemia    IMPLANTATION OF DEFIBRILLATOR, HX OF 02/15/2009   Qualifier: Diagnosis of  By: Jens Som, MD, Lyn Hollingshead    NSVT (nonsustained ventricular tachycardia) (HCC)    Osteoarthritis    Osteoarthrosis, unspecified whether generalized or localized, lower leg    Osteoporosis    Oxygen deficiency    Psoriasis    PVC (premature ventricular contraction)    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 90 tablet 3   apixaban (ELIQUIS) 5 MG  TABS tablet Take 1 tablet (5 mg total) by mouth 2 (two) times daily. 60 tablet 2   digoxin (LANOXIN) 0.125 MG tablet Take 1 tablet (0.125 mg total) by mouth daily. 90 tablet 3   empagliflozin (JARDIANCE) 10 MG TABS tablet Take 1 tablet (10 mg total) by mouth daily. 30 tablet 2   famotidine (PEPCID) 20 MG tablet Take 20 mg by mouth daily.     feeding supplement (ENSURE ENLIVE / ENSURE PLUS) LIQD Take 237 mLs by mouth 2 (two) times daily between  meals.     mirtazapine (REMERON) 15 MG tablet Take 1 tablet (15 mg total) by mouth at bedtime. 30 tablet 3   spironolactone (ALDACTONE) 25 MG tablet Take 1 tablet (25 mg total) by mouth daily. 30 tablet 2   atorvastatin (LIPITOR) 10 MG tablet Take 1 tablet (10 mg total) by mouth every other day. 90 tablet 1   cetirizine (ZYRTEC) 10 MG tablet Take 10 mg by mouth daily. (Patient not taking: Reported on 07/02/2023)     EPINEPHrine 0.3 mg/0.3 mL IJ SOAJ injection Inject 0.3 mg into the muscle as needed for anaphylaxis. (Patient not taking: Reported on 07/02/2023)     furosemide (LASIX) 20 MG tablet Take 1 tablet (20 mg total) by mouth daily as needed. (Patient not taking: Reported on 07/02/2023) 30 tablet 1   losartan (COZAAR) 25 MG tablet Take 0.5 tablets (12.5 mg total) by mouth at bedtime. 30 tablet 2   Menthol-Methyl Salicylate (SALONPAS PAIN RELIEF PATCH) 3-10 % PTCH Apply 1 application  topically daily. (Patient not taking: Reported on 07/02/2023)     No current facility-administered medications for this encounter.    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   Pork-Derived Products Anaphylaxis   Ace Inhibitors Other (See Comments)    Other Reaction: Other reaction/ Urticaria secondary to COX-1 inhibitors Angioedema   Aspirin Other (See Comments)    Other Reaction: Other reaction   Bacid Other (See Comments)   Indocin [Indomethacin] Other (See Comments)   Lisinopril Other (See Comments)   Mobic [Meloxicam] Other (See Comments)   Naproxen Other (See Comments)   Nsaids Other (See Comments)    Other Reaction: Other reaction       Social History   Socioeconomic History   Marital status: Married    Spouse name: Not on file   Number of children: Not on file   Years of education: Not on file   Highest education level: Not on file  Occupational History   Not on file  Tobacco Use   Smoking status: Former    Current packs/day: 0.00     Average packs/day: 1 pack/day for 10.0 years (10.0 ttl pk-yrs)    Types: Cigarettes    Start date: 12/30/1960    Quit date: 12/31/1970    Years since quitting: 52.5    Passive exposure: Past   Smokeless tobacco: Never  Vaping Use   Vaping status: Never Used  Substance and Sexual Activity   Alcohol use: Yes    Alcohol/week: 0.0 standard drinks of alcohol    Comment: 2 DRINKS PER DAY, none this week (7/26)   Drug use: Never   Sexual activity: Not Currently  Other Topics Concern   Not on file  Social History Narrative   Not on file   Social Determinants of Health   Financial Resource Strain: Low Risk  (05/28/2023)   Received from [facility]   Overall Financial  Resource Strain (CARDIA)    Difficulty of Paying Living Expenses: Not hard at all  Food Insecurity: No Food Insecurity (05/28/2023)   Received from [facility]   Hunger Vital Sign    Worried About Running Out of Food in the Last Year: Never true    Ran Out of Food in the Last Year: Never true  Transportation Needs: No Transportation Needs (05/28/2023)   Received from [facility] - Transportation    In the past 12 months, has lack of transportation kept you from medical appointments or from getting medications?: No    Lack of Transportation (Non-Medical): No  Physical Activity: Unknown (08/12/2018)   Exercise Vital Sign    Days of Exercise per Week: Patient declined    Minutes of Exercise per Session: Patient declined  Stress: No Stress Concern Present (08/12/2018)   [employer] of Occupational Health - Occupational Stress Questionnaire    Feeling of Stress : Not at all  Social Connections: Unknown (08/12/2018)   Social Connection and Isolation Panel [NHANES]    Frequency of Communication with Friends and Family: Patient declined    Frequency of Social Gatherings with Friends and Family: Patient declined    Attends Religious Services: Patient  declined    Database administrator or Organizations: Patient declined    Attends Banker Meetings: Patient declined    Marital Status: Patient declined  Intimate Partner Violence: Not At Risk (05/16/2023)   Humiliation, Afraid, Rape, and Kick questionnaire    Fear of Current or Ex-Partner: No    Emotionally Abused: No    Physically Abused: No    Sexually Abused: No      Family History  Problem Relation Age of Onset   Cancer Mother    Lung cancer Mother    Heart attack Father     PHYSICAL EXAM: Vitals:   07/02/23 1116 07/02/23 1117  BP: (!) 118/59 (!) 119/54  Pulse:    SpO2:     GENERAL: Well nourished, well developed, and in no apparent distress at rest.  HEENT: Negative for arcus senilis or xanthelasma. There is no scleral icterus.  The mucous membranes are pink and moist.   NECK: Supple, No masses. Normal carotid upstrokes without bruits. No masses or thyromegaly.    CHEST: There are no chest wall deformities. There is no chest wall tenderness. Respirations are unlabored.  Lungs- CTA B/L CARDIAC:  JVP: 5 cm          Normal rate with regular rhythm. No murmurs, rubs or gallops.  Pulses are 2+ and symmetrical in upper and lower extremities. Mild pretibial edema.  ABDOMEN: Soft, non-tender, non-distended. There are no masses or hepatomegaly. There are normal bowel sounds.  EXTREMITIES: lower extremities cool to touch; calf very tender to palpation bilaterally LYMPHATIC: No axillary or supraclavicular lymphadenopathy.  NEUROLOGIC: Patient is oriented x3 with no focal or lateralizing neurologic deficits.  PSYCH: Patients affect is appropriate, there is no evidence of anxiety or depression.  SKIN: Warm and dry; no lesions or wounds.    DATA REVIEW  ECG: 06/01/23: NSR as per my personal interpretation  ECHO: TEE (05/19/23): Severely reduced LVEF, mild AI.  05/18/23: LVEF <20%, mildly reduced RV function.  11/11/22: LVEF 25%-30%, normal RV function 04/26/23: LVEF  25%-30%, normal RV function 06/05/23: LVEF 35%-40%, normal RV function   ASSESSMENT & PLAN:  Heart failure with reduced ejection fraction Etiology of HF: Nonischemic dilated cardiomyopathy.  As noted above  diagnosed in 2004 with negative endomyocardial biopsy at [facility].  LGE not consistent with any type of infiltrative cardiomyopathy. NYHA class / AHA Stage:IV Volume status & Diuretics: euvolemic, continue lasix PRN only. .  Vasodilators: decrease to 12.5mg  qHS Beta-Blocker:holding due to low output heart failure; continue digoxin. Repeat dig level today ZDG:[id number] spironolactone 25mg  daily Cardiometabolic:jardiance 10mg  daily Devices therapies & Valvulopathies:Primary prevention ICD; Dr. Sharlot Gowda following Advanced therapies:Undergoing LVAD evaluation; current hurdles to LVAD implant are malnutrition/fraility & alpha gal. I have reached out to allergy/immunology, Lucienne Minks, [facility] & other academic institutes. We will plan on either heparin desensitization or Xolair prior to implant. Also we will pre-medicate with H1/H2 blockers + IV steroids prior to use of heparin.  07/02/23: seen by allergy/immunology; if he were to proceed with LVAD we have a plan in place for heparin desensitization. Unfortunately, remains too frail for VAD placement. After extensive discussion and concern for low output heart failure will plan on milrinone initiation with RHC next week followed by inpatient admission.   2. 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  - 05/21/23: IgE total 1512, IgE pork 5.36, IgE Beef 30.50, alpha-gal >100 - discussed with multiple academic centers; see above.  - seen by Allergy/Immunology; will plan on hep desensitization  if we proceed with VAD  3. Atrial fibrillation/flutter - DCCV during admission  - Diagnosed initially in July 2024 now s/p x2 DCCV  - EP following - Continue apixaban 5mg  BID - Now back in rate  controlled atrial fibrillation; increase amio to 400mg . Plan on DCCV while inpatient next week.   4. CKD  - Baseline sCr 1.6 - Repeat labs today  5. Malnutrition/cardiac cachexia - Re-addressed importance of improved nutrition and exercise - start milrinone.  - start remeron 15mg  at bedtime for anxiety/lack of appetite.   6. Lower extremity cramping - performed quick bedside ABIs today which were negative for PAD.  - Likely secondary to low output heart failure - decrease lipitor to 10mg  every other day.   7. Failure to thrive/anxiety - Start remeron 15mg  qHS  I spent 45-50 minutes caring for this patient today including face to face time, ordering and reviewing labs, reviewing records from allergy/immunology (06/10/23), discussing risk/benefits of RHC, discussing milrinone initiation and advanced therapies, reviewing EP notes (07/01/23), seeing the patient, documenting in the record, and arranging follow ups.   Marliss Buttacavoli Advanced Heart Failure Mechanical Circulatory Support

## 2023-07-02 NOTE — Patient Instructions (Signed)
We will schedule you for a Right Cath next. Please plan to be admitted after this procedure. Start Remeron 15 mg every night at bedtime Decrease Losartan to 12.5 mg every night at bedtime (half pill) Decrease Atorvastatin to every other day Increase Amiodarone to 400 mg daily (2 tablets)

## 2023-07-02 NOTE — Progress Notes (Signed)
Patient presents for 1 mo f/u in VAD Clinic today. Last visit pt was given a 3 day course of Lasix. Pt tells me that he hasn't taken Lasix since his last visit.  Pt c/o severe claudication today. He states that he can only walk a short distance and his calves begin to ache. This started 2 weeks ago. He denies SOB. But notes that he has a dry cough which turns into "dry heaves." Pt states that he just "cannot make myself eat." He states that he was drinking 8 Ensure a day but most recently he has been drinking 3 a day. His weight is down 7 lbs today.  Pt is working with home PT. He states this is going ok but says he is limited by the pain in his calves.  PFTs scheduled to follow his appt today.  Discussed pts lab work with Dr Gasper Lloyd. I called pt and spoke with him and his wife about his labs from today. They were informed that his heart is getting weaker and his kidney numbers are up to day as well. Pt was given the option of admission today which he and his wife declined. Pt and wife were instructed that he will need to arrive Tuesday morning 11/19 at 0530 in admitting for a RHC. Pt was informed that he cannot have anything to eat after midnight on Monday. Pt and wife also informed that if he worsens they should come to the ER immediately. They both verbalized agreement.   Vital Signs:  HR: 84 afib BP: 93/57 (69) SPO2: 97%   Weight: 149.2 lb w/o eqt Last weight: 156.2 lb Home weights: 153 lbs   Symptom YES NO DETAILS  Angina  x Activity:  Claudication  x How Far:  Syncope  x When:  Stroke  x   Orthopnea x  How many pillows:3  PND  x How often:  CPAP  x How many hours:  Pedal Edema x  1+ ankle  Abdominal Fullness x    Nausea / Vomit x  No appetite dry heaves  Diaphoresis  x When:  Shortness of Breath  x Activity:  Palpitations  x When:  ICD shock  N/a   Bleeding S/S  x   Tea-colored Urine  x   Hospitalizations  x   Emergency Room  x   Other MD x  10/23 Dr Heinz Knuckles gal.  11/13 Dr Molli Knock not cand for ablation  Activity Home PT  Fluid   Diet       Device: n/a   Patient Instructions:  We will schedule you for a Right Cath next. Please plan to be admitted after this procedure. Start Remeron 15 mg every night at bedtime Decrease Losartan to 12.5 mg every night at bedtime (half pill) Decrease Atorvastatin to every other day Increase Amiodarone to 400 mg daily (2 tablets) Stop Digoxin Stop Spironolactone   Carlton Adam, RN VAD Coordinator    Office: [phone] 24/7 Emergency VAD Pager: [phone]

## 2023-07-07 ENCOUNTER — Encounter (HOSPITAL_COMMUNITY)
Admission: RE | Disposition: A | Discharge status: Home / Self Care | Payer: Self-pay | Source: Home / Self Care | Attending: Cardiology

## 2023-07-07 ENCOUNTER — Encounter (HOSPITAL_COMMUNITY): Payer: Self-pay | Admitting: Cardiology

## 2023-07-07 ENCOUNTER — Ambulatory Visit (HOSPITAL_COMMUNITY)
Admission: RE | Admit: 2023-07-07 | Discharge: 2023-07-07 | Disposition: A | Discharge status: Home / Self Care | Payer: Medicare Other | Attending: Cardiology | Admitting: Cardiology

## 2023-07-07 ENCOUNTER — Other Ambulatory Visit: Payer: Self-pay

## 2023-07-07 DIAGNOSIS — F419 Anxiety disorder, unspecified: Secondary | ICD-10-CM | POA: Diagnosis not present

## 2023-07-07 DIAGNOSIS — I4892 Unspecified atrial flutter: Secondary | ICD-10-CM | POA: Insufficient documentation

## 2023-07-07 DIAGNOSIS — Z7901 Long term (current) use of anticoagulants: Secondary | ICD-10-CM | POA: Insufficient documentation

## 2023-07-07 DIAGNOSIS — Z91014 Allergy to mammalian meats: Secondary | ICD-10-CM | POA: Insufficient documentation

## 2023-07-07 DIAGNOSIS — E46 Unspecified protein-calorie malnutrition: Secondary | ICD-10-CM | POA: Insufficient documentation

## 2023-07-07 DIAGNOSIS — I480 Paroxysmal atrial fibrillation: Secondary | ICD-10-CM | POA: Insufficient documentation

## 2023-07-07 DIAGNOSIS — I509 Heart failure, unspecified: Secondary | ICD-10-CM | POA: Diagnosis not present

## 2023-07-07 DIAGNOSIS — N189 Chronic kidney disease, unspecified: Secondary | ICD-10-CM | POA: Insufficient documentation

## 2023-07-07 DIAGNOSIS — I130 Hypertensive heart and chronic kidney disease with heart failure and stage 1 through stage 4 chronic kidney disease, or unspecified chronic kidney disease: Secondary | ICD-10-CM | POA: Insufficient documentation

## 2023-07-07 DIAGNOSIS — Z87891 Personal history of nicotine dependence: Secondary | ICD-10-CM | POA: Diagnosis not present

## 2023-07-07 DIAGNOSIS — I420 Dilated cardiomyopathy: Secondary | ICD-10-CM | POA: Insufficient documentation

## 2023-07-07 DIAGNOSIS — E88A Wasting disease (syndrome) due to underlying condition: Secondary | ICD-10-CM | POA: Insufficient documentation

## 2023-07-07 DIAGNOSIS — R627 Adult failure to thrive: Secondary | ICD-10-CM | POA: Insufficient documentation

## 2023-07-07 DIAGNOSIS — Z79899 Other long term (current) drug therapy: Secondary | ICD-10-CM | POA: Insufficient documentation

## 2023-07-07 DIAGNOSIS — I5022 Chronic systolic (congestive) heart failure: Secondary | ICD-10-CM

## 2023-07-07 DIAGNOSIS — E785 Hyperlipidemia, unspecified: Secondary | ICD-10-CM | POA: Diagnosis not present

## 2023-07-07 DIAGNOSIS — I712 Thoracic aortic aneurysm, without rupture, unspecified: Secondary | ICD-10-CM | POA: Diagnosis not present

## 2023-07-07 HISTORY — PX: RIGHT HEART CATH: CATH118263

## 2023-07-07 LAB — POCT I-STAT EG7
Acid-Base Excess: 0 mmol/L (ref 0.0–2.0)
Acid-base deficit: 1 mmol/L (ref 0.0–2.0)
Bicarbonate: 23.7 mmol/L (ref 20.0–28.0)
Bicarbonate: 23.8 mmol/L (ref 20.0–28.0)
Calcium, Ion: 1.29 mmol/L (ref 1.15–1.40)
Calcium, Ion: 1.29 mmol/L (ref 1.15–1.40)
HCT: 33 % — ABNORMAL LOW (ref 39.0–52.0)
HCT: 34 % — ABNORMAL LOW (ref 39.0–52.0)
Hemoglobin: 11.2 g/dL — ABNORMAL LOW (ref 13.0–17.0)
Hemoglobin: 11.6 g/dL — ABNORMAL LOW (ref 13.0–17.0)
O2 Saturation: 67 %
O2 Saturation: 69 %
Potassium: 4.5 mmol/L (ref 3.5–5.1)
Potassium: 4.5 mmol/L (ref 3.5–5.1)
Sodium: 133 mmol/L — ABNORMAL LOW (ref 135–145)
Sodium: 133 mmol/L — ABNORMAL LOW (ref 135–145)
TCO2: 25 mmol/L (ref 22–32)
TCO2: 25 mmol/L (ref 22–32)
pCO2, Ven: 36 mm[Hg] — ABNORMAL LOW (ref 44–60)
pCO2, Ven: 36.5 mm[Hg] — ABNORMAL LOW (ref 44–60)
pH, Ven: 7.42 (ref 7.25–7.43)
pH, Ven: 7.427 (ref 7.25–7.43)
pO2, Ven: 34 mm[Hg] (ref 32–45)
pO2, Ven: 35 mm[Hg] (ref 32–45)

## 2023-07-07 LAB — CBC WITH DIFFERENTIAL/PLATELET
Abs Immature Granulocytes: 0.18 10*3/uL — ABNORMAL HIGH (ref 0.00–0.07)
Basophils Absolute: 0.1 10*3/uL (ref 0.0–0.1)
Basophils Relative: 1 %
Eosinophils Absolute: 0.5 10*3/uL (ref 0.0–0.5)
Eosinophils Relative: 4 %
HCT: 36.7 % — ABNORMAL LOW (ref 39.0–52.0)
Hemoglobin: 12.1 g/dL — ABNORMAL LOW (ref 13.0–17.0)
Immature Granulocytes: 2 %
Lymphocytes Relative: 9 %
Lymphs Abs: 0.9 10*3/uL (ref 0.7–4.0)
MCH: 30.9 pg (ref 26.0–34.0)
MCHC: 33 g/dL (ref 30.0–36.0)
MCV: 93.6 fL (ref 80.0–100.0)
Monocytes Absolute: 1 10*3/uL (ref 0.1–1.0)
Monocytes Relative: 9 %
Neutro Abs: 8.3 10*3/uL — ABNORMAL HIGH (ref 1.7–7.7)
Neutrophils Relative %: 75 %
Platelets: 225 10*3/uL (ref 150–400)
RBC: 3.92 MIL/uL — ABNORMAL LOW (ref 4.22–5.81)
RDW: 14.6 % (ref 11.5–15.5)
WBC: 10.9 10*3/uL — ABNORMAL HIGH (ref 4.0–10.5)
nRBC: 0 % (ref 0.0–0.2)

## 2023-07-07 LAB — CK: Total CK: 14 U/L — ABNORMAL LOW (ref 49–397)

## 2023-07-07 LAB — BASIC METABOLIC PANEL
Anion gap: 5 (ref 5–15)
BUN: 35 mg/dL — ABNORMAL HIGH (ref 8–23)
CO2: 24 mmol/L (ref 22–32)
Calcium: 9 mg/dL (ref 8.9–10.3)
Chloride: 102 mmol/L (ref 98–111)
Creatinine, Ser: 1.41 mg/dL — ABNORMAL HIGH (ref 0.61–1.24)
GFR, Estimated: 52 mL/min — ABNORMAL LOW (ref >60)
Glucose, Bld: 93 mg/dL (ref 70–99)
Potassium: 4.5 mmol/L (ref 3.5–5.1)
Sodium: 131 mmol/L — ABNORMAL LOW (ref 135–145)

## 2023-07-07 LAB — GLUCOSE, CAPILLARY: Glucose-Capillary: 95 mg/dL (ref 70–99)

## 2023-07-07 LAB — PROCALCITONIN: Procalcitonin: 0.21 ng/mL

## 2023-07-07 LAB — BRAIN NATRIURETIC PEPTIDE: B Natriuretic Peptide: 378.2 pg/mL — ABNORMAL HIGH (ref 0.0–100.0)

## 2023-07-07 LAB — MAGNESIUM: Magnesium: 2 mg/dL (ref 1.7–2.4)

## 2023-07-07 SURGERY — RIGHT HEART CATH
Anesthesia: LOCAL

## 2023-07-07 MED ORDER — LIDOCAINE HCL (PF) 1 % IJ SOLN
INTRAMUSCULAR | Status: DC | PRN
  Administered 2023-07-07: 2 mL

## 2023-07-07 MED ORDER — CEPHALEXIN 500 MG PO CAPS: 500.0000 mg | ORAL_CAPSULE | Freq: Four times a day (QID) | ORAL | 0 refills | Status: AC

## 2023-07-07 MED ORDER — SODIUM CHLORIDE 0.9 % IV SOLN: INTRAVENOUS | Status: DC

## 2023-07-07 SURGICAL SUPPLY — 5 items
CATH SWAN GANZ 7F STRAIGHT (CATHETERS) IMPLANT
GLIDESHEATH SLENDER 7FR .021G (SHEATH) IMPLANT
PACK CARDIAC CATHETERIZATION (CUSTOM PROCEDURE TRAY) ×1 IMPLANT
TRANSDUCER W/STOPCOCK (MISCELLANEOUS) IMPLANT
TUBING ART PRESS 72 MALE/FEM (TUBING) IMPLANT

## 2023-07-07 NOTE — Interval H&P Note (Signed)
History and Physical Interval Note:  07/07/2023 7:50 AM  [name]  has presented today for surgery, with the diagnosis of heart failure.  The various methods of treatment have been discussed with the patient and family. After consideration of risks, benefits and other options for treatment, the patient has consented to  Procedure(s): RIGHT HEART CATH (N/A) as a surgical intervention.  The patient's history has been reviewed, patient examined, no change in status, stable for surgery.  I have reviewed the patient's chart and labs.  Questions were answered to the patient's satisfaction.     [name]

## 2023-07-07 NOTE — Discharge Instructions (Signed)

## 2023-07-13 ENCOUNTER — Telehealth: Payer: Self-pay | Admitting: Cardiology

## 2023-07-13 ENCOUNTER — Other Ambulatory Visit: Payer: Self-pay | Admitting: Internal Medicine

## 2023-07-13 ENCOUNTER — Ambulatory Visit
Admission: RE | Admit: 2023-07-13 | Discharge: 2023-07-13 | Disposition: A | Discharge status: Home / Self Care | Payer: Medicare Other | Source: Ambulatory Visit | Attending: Internal Medicine | Admitting: Internal Medicine

## 2023-07-13 DIAGNOSIS — M79661 Pain in right lower leg: Secondary | ICD-10-CM | POA: Diagnosis present

## 2023-07-13 DIAGNOSIS — M79662 Pain in left lower leg: Secondary | ICD-10-CM | POA: Insufficient documentation

## 2023-07-13 NOTE — Telephone Encounter (Signed)
Returned call to pt Reports he spoke to pcp-bilateral dopplers ordered and was told if test negative will be referred to vascular surgeon   Nothing additional is needed at this tim\e

## 2023-07-13 NOTE — Telephone Encounter (Signed)
Patient called and said that both his calves are turning green and hurting. Has been on antibiotics for 6 days but has not gotten any better

## 2023-07-14 ENCOUNTER — Other Ambulatory Visit (INDEPENDENT_AMBULATORY_CARE_PROVIDER_SITE_OTHER): Payer: Self-pay | Admitting: Vascular Surgery

## 2023-07-14 DIAGNOSIS — I998 Other disorder of circulatory system: Secondary | ICD-10-CM

## 2023-07-21 ENCOUNTER — Ambulatory Visit (INDEPENDENT_AMBULATORY_CARE_PROVIDER_SITE_OTHER): Payer: Medicare Other

## 2023-07-21 ENCOUNTER — Ambulatory Visit (INDEPENDENT_AMBULATORY_CARE_PROVIDER_SITE_OTHER): Payer: Medicare Other | Admitting: Vascular Surgery

## 2023-07-21 ENCOUNTER — Encounter (INDEPENDENT_AMBULATORY_CARE_PROVIDER_SITE_OTHER): Payer: Self-pay | Admitting: Vascular Surgery

## 2023-07-21 VITALS — BP 116/72 | HR 71 | Resp 18 | Ht 68.0 in | Wt 151.2 lb

## 2023-07-21 DIAGNOSIS — I998 Other disorder of circulatory system: Secondary | ICD-10-CM | POA: Diagnosis not present

## 2023-07-21 DIAGNOSIS — M79609 Pain in unspecified limb: Secondary | ICD-10-CM | POA: Insufficient documentation

## 2023-07-21 DIAGNOSIS — M79605 Pain in left leg: Secondary | ICD-10-CM

## 2023-07-21 DIAGNOSIS — M79604 Pain in right leg: Secondary | ICD-10-CM | POA: Diagnosis not present

## 2023-07-21 DIAGNOSIS — N1832 Chronic kidney disease, stage 3b: Secondary | ICD-10-CM | POA: Diagnosis not present

## 2023-07-21 DIAGNOSIS — I10 Essential (primary) hypertension: Secondary | ICD-10-CM | POA: Diagnosis not present

## 2023-07-21 DIAGNOSIS — I428 Other cardiomyopathies: Secondary | ICD-10-CM | POA: Diagnosis not present

## 2023-07-21 NOTE — Progress Notes (Signed)
Patient ID: [name], male   DOB: 02-17-1946, 77 y.o.   MRN: [id number]  Chief Complaint  Patient presents with   New Patient (Initial Visit)    URGENT: np. ABI + consult. LE ischemia. DVT US results in Epic. miller, mark    HPI [name] is a 77 y.o. male.  I am asked to see the patient by DR. Miller for evaluation of bilateral lower extremity pain.  This began somewhat abruptly a couple of weeks ago.  It involves largely his calf and the posterior lower leg area.  There were no clear causes or inciting events.  This has waxed and waned somewhat, but it continues to persist although it is not quite as severe now as it was.  He is now able to walk.  No open wounds or infection.  No fevers or chills.  He had a negative DVT study done by his primary care physician last week and is brought in today for evaluation of his arterial system.  His ABIs are falsely elevated at >1.3 for medial calcification, but he has triphasic waveforms and normal digital pressures and waveforms consistent with no significant arterial insufficiency.     Past Medical History:  Diagnosis Date   Abnormal chest CT    Anaphylactic reaction    Anxiety    Asthma    Benign hypertension    Cardiomyopathy    Cardiomyopathy, idiopathic (HCC)    CHF (congestive heart failure) (HCC)    Chronic renal insufficiency    With creatinine 1.4   Complication of anesthesia    Drug-induced urticaria    FH: thoracic aortic aneurysm    GERD (gastroesophageal reflux disease)    History of colon polyps    Hives    Long Hx of hives   Hyperlipidemia    Hyperlipidemia    IMPLANTATION OF DEFIBRILLATOR, HX OF 02/15/2009   Qualifier: Diagnosis of  By: Jens Som, MD, Lyn Hollingshead    NSVT (nonsustained ventricular tachycardia) (HCC)    Osteoarthritis    Osteoarthrosis, unspecified whether generalized or localized, lower leg    Osteoporosis    Oxygen deficiency    Psoriasis    PVC (premature ventricular  contraction)    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    Past Surgical History:  Procedure Laterality Date   APPENDECTOMY     BIV ICD GENERTAOR CHANGE OUT N/A 10/08/2012   Procedure: BIV ICD GENERTAOR CHANGE OUT;  Surgeon: Marinus Maw, MD;  Location: [facility] CATH LAB;  Service: Cardiovascular;  Laterality: N/A;   CARDIAC CATHETERIZATION     CARDIAC DEFIBRILLATOR REMOVAL  2014   CARDIOVERSION N/A 04/07/2023   Procedure: CARDIOVERSION;  Surgeon: Chilton Si, MD;  Location: [facility] INVASIVE CV LAB;  Service: Cardiovascular;  Laterality: N/A;   CATARACT EXTRACTION     CATARACT EXTRACTION W/PHACO Left 03/12/2016   Procedure: CATARACT EXTRACTION PHACO AND INTRAOCULAR LENS PLACEMENT (IOC);  Surgeon: Sallee Lange, MD;  Location: [facility] ORS;  Service: Ophthalmology;  Laterality: Left;  [place] 01:32 AP% 25.7 CDE 41.33 fluid pack lot # [id number]   COLONOSCOPY WITH PROPOFOL N/A 04/26/2019   Procedure: COLONOSCOPY WITH PROPOFOL;  Surgeon: Christena Deem, MD;  Location: [facility] ENDOSCOPY;  Service: Endoscopy;  Laterality: N/A;   ESOPHAGOGASTRODUODENOSCOPY     EYE SURGERY     Implantation of dual-chamber ICD.     [employer] Jude DDD-ICD 1/07   JOINT REPLACEMENT Right 08/16/2009   KNEE ARTHROPLASTY Left 2006   KNEE  ARTHROPLASTY Right 12/10   x2   LAPAROSCOPIC APPENDECTOMY N/A 08/12/2018   Procedure: APPENDECTOMY LAPAROSCOPIC;  Surgeon: Henrene Dodge, MD;  Location: [facility] ORS;  Service: General;  Laterality: N/A;   RIGHT HEART CATH N/A 07/07/2023   Procedure: RIGHT HEART CATH;  Surgeon: [name], DO;  Location: [facility] INVASIVE CV LAB;  Service: Cardiovascular;  Laterality: N/A;   TONSILLECTOMY       Family History  Problem Relation Age of Onset   Cancer Mother    Lung cancer Mother    Heart attack Father      Social History   Tobacco Use   Smoking status: Former    Current packs/day: 0.00    Average packs/day: 1 pack/day for 10.0 years (10.0 ttl pk-yrs)    Types: Cigarettes    Start  date: 12/30/1960    Quit date: 12/31/1970    Years since quitting: 52.5    Passive exposure: Past   Smokeless tobacco: Never  Vaping Use   Vaping status: Never Used  Substance Use Topics   Alcohol use: Yes    Alcohol/week: 0.0 standard drinks of alcohol    Comment: 2 DRINKS PER DAY, none this week (7/26)   Drug use: Never     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   Lisinopril Swelling and Other (See Comments)   Mobic [Meloxicam] Other (See Comments)    Unknown reaction   Naproxen Other (See Comments)    Unknown reaction   Nsaids Other (See Comments)    Unknown reaction    Current Outpatient Medications  Medication Sig Dispense Refill   amiodarone (PACERONE) 200 MG tablet Take 2 tablets (400 mg total) by mouth daily. (Patient taking differently: Take 200 mg by mouth 2 (two) times daily.) 90 tablet 3   apixaban (ELIQUIS) 5 MG TABS tablet Take 1 tablet (5 mg total) by mouth 2 (two) times daily. 60 tablet 2   atorvastatin (LIPITOR) 10 MG tablet Take 1 tablet (10 mg total) by mouth every other day. (Patient taking differently: Take 10 mg by mouth every other day. At night) 90 tablet 1   atorvastatin (LIPITOR) 20 MG tablet Take 1 tablet by mouth daily.     cetirizine (ZYRTEC) 10 MG tablet Take 10 mg by mouth daily as needed for allergies.     empagliflozin (JARDIANCE) 10 MG TABS tablet Take 1 tablet (10 mg total) by mouth daily. 30 tablet 2   EPINEPHrine 0.3 mg/0.3 mL IJ SOAJ injection Inject 0.3 mg into the muscle as needed for anaphylaxis.     famotidine (PEPCID) 20 MG tablet Take  20 mg by mouth daily.     feeding supplement (ENSURE ENLIVE / ENSURE PLUS) LIQD Take 237 mLs by mouth 2 (two) times daily between meals.     furosemide (LASIX) 20 MG tablet Take 1 tablet (20 mg total) by mouth daily as needed. 30 tablet 1   losartan (COZAAR) 25 MG tablet Take 0.5 tablets (12.5 mg total) by mouth at bedtime. 30 tablet 2   mirtazapine (REMERON) 15 MG tablet Take 1 tablet (15 mg  total) by mouth at bedtime. 30 tablet 3   No current facility-administered medications for this visit.      REVIEW OF SYSTEMS (Negative unless checked)  Constitutional: [] Weight loss  [] Fever  [] Chills Cardiac: [] Chest pain   [] Chest pressure   [x] Palpitations   [] Shortness of breath when laying flat   [] Shortness of breath at rest   [x] Shortness of breath with exertion. Vascular:  [x] Pain in legs with walking   [x] Pain in legs at rest   [] Pain in legs when laying flat   [] Claudication   [] Pain in feet when walking  [] Pain in feet at rest  [] Pain in feet when laying flat   [] History of DVT   [] Phlebitis   [x] Swelling in legs   [] Varicose veins   [] Non-healing ulcers Pulmonary:   [] Uses home oxygen   [] Productive cough   [] Hemoptysis   [] Wheeze  [] COPD   [] Asthma Neurologic:  [] Dizziness  [] Blackouts   [] Seizures   [] History of stroke   [] History of TIA  [] Aphasia   [] Temporary blindness   [] Dysphagia   [] Weakness or numbness in arms   [] Weakness or numbness in legs Musculoskeletal:  [] Arthritis   [] Joint swelling   [x] Joint pain   [] Low back pain Hematologic:  [] Easy bruising  [] Easy bleeding   [] Hypercoagulable state   [] Anemic  [] Hepatitis Gastrointestinal:  [] Blood in stool   [] Vomiting blood  [x] Gastroesophageal reflux/heartburn   [] Abdominal pain Genitourinary:  [x] Chronic kidney disease   [] Difficult urination  [] Frequent urination  [] Burning with urination   [] Hematuria Skin:  [] Rashes   [] Ulcers   [] Wounds Psychological:  [x] History of anxiety   []  History of major depression.    Physical  Exam BP 116/72 (BP Location: Right Arm)   Pulse 71   Resp 18   Ht 5\' 8"  (1.727 m)   Wt 151 lb 3.2 oz (68.6 kg)   BMI 22.99 kg/m  Gen:  WD/WN, NAD Head: [place]/AT, No temporalis wasting.  Ear/Nose/Throat: Hearing grossly intact, nares w/o erythema or drainage, oropharynx w/o Erythema/Exudate Eyes: Conjunctiva clear, sclera non-icteric  Neck: trachea midline.  No JVD.  Pulmonary:  Good air movement, respirations not labored, no use of accessory muscles  Cardiac: irregular Vascular:  Vessel Right Left  Radial Palpable Palpable                          DP 1+ 1+  PT Trace  Trace    Gastrointestinal:. No masses, surgical incisions, or scars. Musculoskeletal: M/S 5/5 throughout.  Mild purplish discoloration of both lower extremities which are mildly tender to touch in the calf and lower leg.  1+ bilateral lower extremity Neurologic: Sensation grossly intact in extremities.  Symmetrical.  Speech is fluent. Motor exam as listed above. Psychiatric: Judgment intact, Mood & affect appropriate for pt's clinical situation. Dermatologic: No rashes or ulcers noted.  No cellulitis or open wounds.    Radiology US Venous Img Lower Bilateral (DVT)  Result Date: 07/13/2023 CLINICAL DATA:  Bilateral lower extremity pain and edema. History of smoking. Evaluate for DVT. EXAM: BILATERAL LOWER EXTREMITY VENOUS DOPPLER ULTRASOUND TECHNIQUE: Gray-scale sonography with graded compression, as well as color Doppler and duplex ultrasound were performed to evaluate the lower extremity deep venous systems from the level of the common femoral vein and including the common femoral, femoral, profunda femoral, popliteal and calf veins including the posterior tibial, peroneal and gastrocnemius veins when visible. The superficial great saphenous vein was also interrogated. Spectral Doppler was utilized  to evaluate flow at rest and with distal augmentation maneuvers in the common femoral, femoral and popliteal veins.  COMPARISON:  None Available. FINDINGS: RIGHT LOWER EXTREMITY Common Femoral Vein: No evidence of thrombus. Normal compressibility, respiratory phasicity and response to augmentation. Saphenofemoral Junction: No evidence of thrombus. Normal compressibility and flow on color Doppler imaging. Profunda Femoral Vein: No evidence of thrombus. Normal compressibility and flow on color Doppler imaging. Femoral Vein: No evidence of thrombus. Normal compressibility, respiratory phasicity and response to augmentation. Popliteal Vein: No evidence of thrombus. Normal compressibility, respiratory phasicity and response to augmentation. Calf Veins: No evidence of thrombus. Normal compressibility and flow on color Doppler imaging. Superficial Great Saphenous Vein: No evidence of thrombus. Normal compressibility. Other Findings:  None. LEFT LOWER EXTREMITY Common Femoral Vein: No evidence of thrombus. Normal compressibility, respiratory phasicity and response to augmentation. Saphenofemoral Junction: No evidence of thrombus. Normal compressibility and flow on color Doppler imaging. Profunda Femoral Vein: No evidence of thrombus. Normal compressibility and flow on color Doppler imaging. Femoral Vein: No evidence of thrombus. Normal compressibility, respiratory phasicity and response to augmentation. Popliteal Vein: No evidence of thrombus. Normal compressibility, respiratory phasicity and response to augmentation. Calf Veins: No evidence of thrombus. Normal compressibility and flow on color Doppler imaging. Superficial Great Saphenous Vein: No evidence of thrombus. Normal compressibility. Other Findings:  None. IMPRESSION: No evidence of DVT within either lower extremity. Electronically Signed   By: Simonne Come M.D.   On: 07/13/2023 16:11   CARDIAC CATHETERIZATION  Result Date: 07/07/2023 HEMODYNAMICS: RA:   1 mmHg (mean) RV:   27/1 mmHg PA:   32/13 mmHg (20 mean) PCWP:  3 mmHg (mean)    Estimated Fick CO/CI   4.6 L/min, 2.6  L/min/m2 Thermodilution CO/CI   5.7 L/min, 3.2 L/min/m2    TPG    17  mmHg     PVR     2.9-3.7 Wood Units PAPi      >10  IMPRESSION: Low pre and post capillary filling pressures. Normal cardiac output / index. 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   BUN 24 (H) 8 - 23 mg/dL   Creatinine, Ser 1.61 (H) 0.61 - 1.24 mg/dL   Calcium 8.4 (L) 8.9 - 10.3 mg/dL   Total Protein 6.4 (L) 6.5 - 8.1 g/dL   Albumin 3.1 (L) 3.5 - 5.0 g/dL   AST 096 (H) 15 - 41 U/L   ALT 138 (H) 0 - 44 U/L   Alkaline Phosphatase 125 38 - 126 U/L   Total Bilirubin 2.1 (H) 0.3 - 1.2 mg/dL   GFR, Estimated 30 (L) >60 mL/min    Comment: (NOTE) Calculated using the CKD-EPI Creatinine Equation (2021)    Anion gap 14 5 - 15    Comment: Performed at [facility], [address], [place], [place] [address]  Troponin I (High Sensitivity)     Status: Abnormal   Collection  Time: 05/14/23  9:46 AM  Result Value Ref Range   Troponin I (High Sensitivity) 44 (H) <18 ng/L    Comment: (NOTE) Elevated high sensitivity troponin I (hsTnI) values and significant  changes across serial measurements may suggest ACS but many other  chronic and acute conditions are known to elevate hsTnI results.  Refer to the "Links" section for chest pain algorithms and additional  guidance. Performed at [facility], [address] [place], [place] [address]   Brain natriuretic peptide     Status: Abnormal   Collection Time: 05/14/23  9:46 AM  Result Value Ref Range   B  Natriuretic Peptide 4,176.1 (H) 0.0 - 100.0 pg/mL    Comment: Performed at [facility], [address] [place], [place] [address]  CBC with Differential     Status: Abnormal   Collection Time: 05/14/23  9:46 AM  Result Value Ref Range   WBC 7.7 4.0 - 10.5 K/uL   RBC 4.05 (L) 4.22 - 5.81 MIL/uL   Hemoglobin 13.0 13.0 - 17.0 g/dL   HCT 25.3 66.4 - 40.3 %   MCV 99.8 80.0 - 100.0 fL   MCH 32.1 26.0 - 34.0 pg   MCHC 32.2 30.0 - 36.0 g/dL   RDW 47.4 25.9 - 56.3 %   Platelets 267 150 - 400 K/uL   nRBC 0.0 0.0 - 0.2 %   Neutrophils Relative % 74 %   Neutro Abs 5.8 1.7 - 7.7 K/uL   Lymphocytes Relative 14 %   Lymphs Abs 1.0 0.7 - 4.0 K/uL   Monocytes Relative 10 %   Monocytes Absolute 0.7 0.1 - 1.0 K/uL   Eosinophils Relative 1 %   Eosinophils Absolute 0.0 0.0 - 0.5 K/uL   Basophils Relative 1 %   Basophils Absolute 0.1 0.0 - 0.1 K/uL   Immature Granulocytes 0 %   Abs Immature Granulocytes 0.03 0.00 - 0.07 K/uL    Comment: Performed at [facility], [address] [place], [place] [address]  Troponin I (High Sensitivity)     Status: Abnormal   Collection Time: 05/14/23 12:01 PM  Result Value Ref Range   Troponin I (High Sensitivity) 46 (H) <18 ng/L    Comment: (NOTE) Elevated high sensitivity troponin I (hsTnI) values and significant  changes across serial measurements may suggest ACS but many other  chronic and acute conditions are known to elevate hsTnI results.  Refer to the "Links" section for chest pain algorithms and additional  guidance. Performed at [facility], [address] [place], [place] [address]   Hepatitis panel, acute     Status: None   Collection Time: 05/14/23  1:43 PM  Result Value Ref Range   Hepatitis B Surface Ag NON REACTIVE NON REACTIVE   HCV Ab NON REACTIVE NON REACTIVE    Comment: (NOTE) Nonreactive HCV antibody screen is consistent with no HCV infections,  unless recent infection is suspected or other evidence  exists to indicate HCV infection.     Hep A IgM NON REACTIVE NON REACTIVE   Hep B C IgM NON REACTIVE NON REACTIVE    Comment: Performed at [facility], [address] [place], [place] [address]  Acetaminophen level     Status: None   Collection Time: 05/14/23  1:43 PM  Result Value Ref Range   Acetaminophen (Tylenol), Serum 10 10 - 30 ug/mL    Comment: (NOTE) Therapeutic concentrations vary significantly. A range of 10-30 ug/mL  may be  an effective concentration for many patients. However, some  are best treated at concentrations outside of this range. Acetaminophen concentrations >150 ug/mL at 4 hours after ingestion  and >50 ug/mL at 12 hours after ingestion are often associated with  toxic reactions.  Performed at [facility], [address] [place], [place] [address]   Blood gas, venous     Status: Abnormal   Collection Time: 05/14/23  1:43 PM  Result Value Ref Range   pH, Ven 7.29 7.25 - 7.43   pCO2, Ven 39 (L) 44 - 60 mmHg   pO2, Ven 35 32 - 45 mmHg   Bicarbonate 18.8 (L) 20.0 - 28.0 mmol/L   Acid-base deficit 7.3 (H) 0.0 - 2.0 mmol/L   O2 Saturation 49.6 %   Patient temperature 37.0    Collection site VENOUS     Comment: Performed at [facility], [address], [place], [place] [address]  Ethanol     Status: None   Collection Time: 05/14/23  1:43 PM  Result Value Ref Range   Alcohol, Ethyl (B) <10 <10 mg/dL    Comment: (NOTE) Lowest detectable limit for serum alcohol is 10 mg/dL.  For medical purposes only. Performed at [facility], [address] [place], [place] [address]   Troponin I (High Sensitivity)     Status: Abnormal   Collection Time: 05/14/23  5:42 PM  Result Value Ref Range   Troponin I (High Sensitivity) 71 (H) <18 ng/L    Comment: READ BACK AND VERIFIED WITH KYLIE PERRY AT 1835 ON 05/14/23 BY SS (NOTE) Elevated high sensitivity troponin I (hsTnI) values and significant  changes across serial  measurements may suggest ACS but many other  chronic and acute conditions are known to elevate hsTnI results.  Refer to the "Links" section for chest pain algorithms and additional  guidance. Performed at [facility], [address] [place], [place] [address]   Basic metabolic panel     Status: Abnormal   Collection Time: 05/14/23  5:42 PM  Result Value Ref Range   Sodium 134 (L) 135 - 145 mmol/L   Potassium 4.3 3.5 - 5.1 mmol/L   Chloride 102 98 - 111 mmol/L   CO2 18 (L) 22 - 32 mmol/L   Glucose, Bld 104 (H) 70 - 99 mg/dL    Comment: Glucose reference range applies only to samples taken after fasting for at least 8 hours.   BUN 27 (H) 8 - 23 mg/dL   Creatinine, Ser 2.95 (H) 0.61 - 1.24 mg/dL   Calcium 8.3 (L) 8.9 - 10.3 mg/dL   GFR, Estimated 29 (L) >60 mL/min    Comment: (NOTE) Calculated using the CKD-EPI Creatinine Equation (2021)    Anion gap 14 5 - 15    Comment: Performed at [facility], [address] [place], [place] [address]  Troponin I (High Sensitivity)     Status: Abnormal   Collection Time: 05/14/23  7:32 PM  Result Value Ref Range   Troponin I (High Sensitivity) 53 (H) <18 ng/L    Comment: (NOTE) Elevated high sensitivity troponin I (hsTnI) values and significant  changes across serial measurements may suggest ACS but many other  chronic and acute conditions are known to elevate hsTnI results.  Refer to the "Links" section for chest pain algorithms and additional  guidance. Performed at [facility], [address], [place], [place] [address]   Troponin I (High Sensitivity)     Status: Abnormal  Collection Time: 05/14/23  9:23 PM  Result Value Ref Range   Troponin I (High Sensitivity) 82 (H) <18 ng/L    Comment: READ BACK AND VERIFIED WITH LINDSAY MARTIN RN @2225  05/14/23 ASW (NOTE) Elevated high sensitivity troponin I (hsTnI) values and significant  changes across serial measurements may suggest ACS but many other   chronic and acute conditions are known to elevate hsTnI results.  Refer to the "Links" section for chest pain algorithms and additional  guidance. Performed at [facility], [address] [place], [place] [address]   CBC     Status: Abnormal   Collection Time: 05/15/23  3:11 AM  Result Value Ref Range   WBC 13.8 (H) 4.0 - 10.5 K/uL   RBC 4.01 (L) 4.22 - 5.81 MIL/uL   Hemoglobin 12.9 (L) 13.0 - 17.0 g/dL   HCT 02.7 25.3 - 66.4 %   MCV 99.0 80.0 - 100.0 fL   MCH 32.2 26.0 - 34.0 pg   MCHC 32.5 30.0 - 36.0 g/dL   RDW 40.3 47.4 - 25.9 %   Platelets 223 150 - 400 K/uL   nRBC 0.1 0.0 - 0.2 %    Comment: Performed at [facility], [address], [place], [place] [address]  Comprehensive metabolic panel     Status: Abnormal   Collection Time: 05/15/23  3:11 AM  Result Value Ref Range   Sodium 133 (L) 135 - 145 mmol/L   Potassium 4.9 3.5 - 5.1 mmol/L   Chloride 98 98 - 111 mmol/L   CO2 17 (L) 22 - 32 mmol/L   Glucose, Bld 91 70 - 99 mg/dL    Comment: Glucose reference range applies only to samples taken after fasting for at least 8 hours.   BUN 33 (H) 8 - 23 mg/dL   Creatinine, Ser 5.64 (H) 0.61 - 1.24 mg/dL   Calcium 8.3 (L) 8.9 - 10.3 mg/dL   Total Protein 6.1 (L) 6.5 - 8.1 g/dL   Albumin 3.2 (L) 3.5 - 5.0 g/dL   AST 3,329 (H) 15 - 41 U/L    Comment: RESULT CONFIRMED BY MANUAL DILUTION ASW   ALT 2,191 (H) 0 - 44 U/L   Alkaline Phosphatase 120 38 - 126 U/L   Total Bilirubin 3.8 (H) 0.3 - 1.2 mg/dL   GFR, Estimated 21 (L) >60 mL/min    Comment: (NOTE) Calculated using the CKD-EPI Creatinine Equation (2021)    Anion gap 18 (H) 5 - 15    Comment: Performed at [facility], [address] [place], [place] [address]  Lactic acid, plasma     Status: Abnormal   Collection Time: 05/15/23  2:11 PM  Result Value Ref Range   Lactic Acid, Venous 4.7 (HH) 0.5 - 1.9 mmol/L    Comment: CRITICAL RESULT CALLED TO, READ BACK BY AND VERIFIED WITH JUAN  RODRIGUEZ AT 1446 05/15/23 DAS Performed at [facility], [address] [place], [place] [address]   Glucose, capillary     Status: Abnormal   Collection Time: 05/15/23  3:51 PM  Result Value Ref Range   Glucose-Capillary 14 (LL) 70 - 99 mg/dL    Comment: Glucose reference range applies only to samples taken after fasting for at least 8 hours.   Comment 1 Repeat Test   MRSA Next Gen by PCR, Nasal     Status: None   Collection Time: 05/15/23  3:53 PM   Specimen: Nasal Mucosa; Nasal Swab  Result Value Ref Range  MRSA by PCR Next Gen NOT DETECTED NOT DETECTED    Comment: (NOTE) The GeneXpert MRSA Assay (FDA approved for NASAL specimens only), is one component of a comprehensive MRSA colonization surveillance program. It is not intended to diagnose MRSA infection nor to guide or monitor treatment for MRSA infections. Test performance is not FDA approved in patients less than 58 years old. Performed at [facility], [address] [place], [place] [address]   Glucose, capillary     Status: Abnormal   Collection Time: 05/15/23  3:55 PM  Result Value Ref Range   Glucose-Capillary 58 (L) 70 - 99 mg/dL    Comment: Glucose reference range applies only to samples taken after fasting for at least 8 hours.  Glucose, capillary     Status: Abnormal   Collection Time: 05/15/23  4:17 PM  Result Value Ref Range   Glucose-Capillary 221 (H) 70 - 99 mg/dL    Comment: Glucose reference range applies only to samples taken after fasting for at least 8 hours.  MRSA Next Gen by PCR, Nasal     Status: None   Collection Time: 05/15/23  7:48 PM   Specimen: Nasal Mucosa; Nasal Swab  Result Value Ref Range   MRSA by PCR Next Gen NOT DETECTED NOT DETECTED    Comment: (NOTE) The GeneXpert MRSA Assay (FDA approved for NASAL specimens only), is one component of a comprehensive MRSA colonization surveillance program. It is not intended to diagnose MRSA infection nor to guide or monitor  treatment for MRSA infections. Test performance is not FDA approved in patients less than 39 years old. Performed at [facility], [address], [place], [place] [address]   Glucose, capillary     Status: Abnormal   Collection Time: 05/15/23  8:18 PM  Result Value Ref Range   Glucose-Capillary 67 (L) 70 - 99 mg/dL    Comment: Glucose reference range applies only to samples taken after fasting for at least 8 hours.  Glucose, capillary     Status: None   Collection Time: 05/15/23  9:34 PM  Result Value Ref Range   Glucose-Capillary 73 70 - 99 mg/dL    Comment: Glucose reference range applies only to samples taken after fasting for at least 8 hours.  Lactic acid, plasma     Status: None   Collection Time: 05/16/23 12:15 AM  Result Value Ref Range   Lactic Acid, Venous 1.5 0.5 - 1.9 mmol/L    Comment: Performed at [facility], [address], [place], [place] [address]  Glucose, capillary     Status: None   Collection Time: 05/16/23 12:54 AM  Result Value Ref Range   Glucose-Capillary 82 70 - 99 mg/dL    Comment: Glucose reference range applies only to samples taken after fasting for at least 8 hours.  Lactic acid, plasma     Status: None   Collection Time: 05/16/23  2:21 AM  Result Value Ref Range   Lactic Acid, Venous 1.5 0.5 - 1.9 mmol/L    Comment: Performed at [facility], [address], [place], [place] [address]  Glucose, capillary     Status: Abnormal   Collection Time: 05/16/23  6:41 AM  Result Value Ref Range   Glucose-Capillary 51 (L) 70 - 99 mg/dL    Comment: Glucose reference range applies only to samples taken after fasting for at least 8 hours.  Basic metabolic panel     Status: Abnormal   Collection  Time: 05/16/23  6:58 AM  Result Value Ref Range   Sodium 129 (L) 135 - 145 mmol/L   Potassium 4.1 3.5 - 5.1 mmol/L   Chloride 98 98 - 111 mmol/L   CO2 16 (L) 22 - 32 mmol/L   Glucose, Bld 63 (L) 70 - 99 mg/dL    Comment: Glucose reference range  applies only to samples taken after fasting for at least 8 hours.   BUN 45 (H) 8 - 23 mg/dL   Creatinine, Ser 7.82 (H) 0.61 - 1.24 mg/dL   Calcium 7.2 (L) 8.9 - 10.3 mg/dL   GFR, Estimated 13 (L) >60 mL/min    Comment: (NOTE) Calculated using the CKD-EPI Creatinine Equation (2021)    Anion gap 15 5 - 15    Comment: Performed at [facility], [address], [place], [place] [address]  Magnesium     Status: None   Collection Time: 05/16/23  6:58 AM  Result Value Ref Range   Magnesium 1.9 1.7 - 2.4 mg/dL    Comment: Performed at [facility], [address] [place], [place] [address]  APTT     Status: Abnormal   Collection Time: 05/16/23  6:58 AM  Result Value Ref Range   aPTT 180 (HH) 24 - 36 seconds    Comment:        IF BASELINE aPTT IS ELEVATED, SUGGEST PATIENT RISK ASSESSMENT BE USED TO DETERMINE APPROPRIATE ANTICOAGULANT THERAPY. REPEATED TO VERIFY CRITICAL RESULT CALLED TO, READ BACK BY AND VERIFIED WITH: Rosemarie Beath, RN ON [phone] AT [phone] BY S[employer]LL [employer]IO Performed at [facility], [address], [place], [place] [address]   CBC     Status: Abnormal   Collection Time: 05/16/23  6:58 AM  Result Value Ref Range   WBC 9.6 4.0 - 10.5 K/uL   RBC 3.51 (L) 4.22 - 5.81 MIL/uL   Hemoglobin 11.6 (L) 13.0 - 17.0 g/dL   HCT 01.0 (L) 27.2 - 53.6 %   MCV 95.7 80.0 - 100.0 fL   MCH 33.0 26.0 - 34.0 pg   MCHC 34.5 30.0 - 36.0 g/dL   RDW 64.4 03.4 - 74.2 %   Platelets 160 150 - 400 K/uL   nRBC 0.0 0.0 - 0.2 %    Comment: Performed at [facility], [address], [place], [place] [address]  Hepatic function panel     Status: Abnormal   Collection Time: 05/16/23  6:58 AM  Result Value Ref Range   Total Protein 4.9 (L) 6.5 - 8.1 g/dL   Albumin 2.7 (L) 3.5 - 5.0 g/dL   AST 8,756 (H) 15 - 41 U/L    Comment: RESULT CONFIRMED BY MANUAL DILUTION   ALT 2,577 (H) 0 - 44 U/L    Comment: RESULT CONFIRMED BY MANUAL DILUTION   Alkaline Phosphatase 131 (H)  38 - 126 U/L   Total Bilirubin 3.7 (H) 0.3 - 1.2 mg/dL   Bilirubin, Direct 1.8 (H) 0.0 - 0.2 mg/dL   Indirect Bilirubin 1.9 (H) 0.3 - 0.9 mg/dL    Comment: Performed at [facility], [address] [place], [place] [address]  Glucose, capillary     Status: None   Collection Time: 05/16/23  7:02 AM  Result Value Ref Range   Glucose-Capillary 76 70 - 99 mg/dL    Comment: Glucose reference range applies only to samples taken after fasting for at least 8 hours.  Glucose, capillary  Status: Abnormal   Collection Time: 05/16/23 11:48 AM  Result Value Ref Range   Glucose-Capillary 126 (H) 70 - 99 mg/dL    Comment: Glucose reference range applies only to samples taken after fasting for at least 8 hours.  APTT     Status: Abnormal   Collection Time: 05/16/23 12:30 PM  Result Value Ref Range   aPTT 162 (HH) 24 - 36 seconds    Comment:        IF BASELINE aPTT IS ELEVATED, SUGGEST PATIENT RISK ASSESSMENT BE USED TO DETERMINE APPROPRIATE ANTICOAGULANT THERAPY. REPEATED TO VERIFY CRITICAL RESULT CALLED TO, READ BACK BY AND VERIFIED WITH: NOTIFIED Rosemarie Beath, RN ON [phone] AT [phone] BY SWEET[employer]LL C[employer]ODIO Performed at [facility], [address] [place], [place] [address]   Basic metabolic panel     Status: Abnormal   Collection Time: 05/16/23  1:09 PM  Result Value Ref Range   Sodium 129 (L) 135 - 145 mmol/L   Potassium 3.6 3.5 - 5.1 mmol/L   Chloride 95 (L) 98 - 111 mmol/L   CO2 18 (L) 22 - 32 mmol/L   Glucose, Bld 179 (H) 70 - 99 mg/dL    Comment: Glucose reference range applies only to samples taken after fasting for at least 8 hours.   BUN 48 (H) 8 - 23 mg/dL   Creatinine, Ser 6.21 (H) 0.61 - 1.24 mg/dL   Calcium 7.3 (L) 8.9 - 10.3 mg/dL   GFR, Estimated 13 (L) >60 mL/min    Comment: (NOTE) Calculated using the CKD-EPI Creatinine Equation (2021)    Anion gap 16 (H) 5 - 15    Comment: Performed at [facility], [address] [place], [place] [address]   Cooxemetry Panel (carboxy, met, total hgb, O2 sat)     Status: Abnormal   Collection Time: 05/16/23  1:09 PM  Result Value Ref Range   Total hemoglobin 11.2 (L) 12.0 - 16.0 g/dL   O2 Saturation 68 %   Carboxyhemoglobin 1.3 0.5 - 1.5 %   Methemoglobin <0.7 0.0 - 1.5 %    Comment: Performed at [facility], [address], [place], [place] [address]  Glucose, capillary     Status: Abnormal   Collection Time: 05/16/23  4:28 PM  Result Value Ref Range   Glucose-Capillary 161 (H) 70 - 99 mg/dL    Comment: Glucose reference range applies only to samples taken after fasting for at least 8 hours.  APTT     Status: Abnormal   Collection Time: 05/16/23  4:29 PM  Result Value Ref Range   aPTT 152 (H) 24 - 36 seconds    Comment:        IF BASELINE aPTT IS ELEVATED, SUGGEST PATIENT RISK ASSESSMENT BE USED TO DETERMINE APPROPRIATE ANTICOAGULANT THERAPY. Performed at [facility], [address], [place], [place] [address]   Cooxemetry Panel (carboxy, met, total hgb, O2 sat)     Status: Abnormal   Collection Time: 05/16/23  5:43 PM  Result Value Ref Range   Total hemoglobin 11.4 (L) 12.0 - 16.0 g/dL   O2 Saturation 84.1 %   Carboxyhemoglobin 0.9 0.5 - 1.5 %   Methemoglobin <0.7 0.0 - 1.5 %    Comment: Performed at [facility], [address] [place], [place] [address]  CG4 I-STAT (Lactic acid)     Status: None   Collection Time: 05/16/23  5:43 PM  Result Value Ref Range  Lactic Acid, Venous 1.7 0.5 - 1.9 mmol/L  Glucose, capillary     Status: Abnormal   Collection Time: 05/16/23  7:54 PM  Result Value Ref Range   Glucose-Capillary 138 (H) 70 - 99 mg/dL    Comment: Glucose reference range applies only to samples taken after fasting for at least 8 hours.  APTT     Status: Abnormal   Collection Time: 05/16/23 10:26 PM  Result Value Ref Range   aPTT 114 (H) 24 - 36 seconds    Comment:        IF BASELINE aPTT IS ELEVATED, SUGGEST PATIENT RISK ASSESSMENT BE USED TO  DETERMINE APPROPRIATE ANTICOAGULANT THERAPY. Performed at [facility], [address] [place], [place] [address]   Basic metabolic panel     Status: Abnormal   Collection Time: 05/17/23 12:17 AM  Result Value Ref Range   Sodium 123 (L) 135 - 145 mmol/L   Potassium 3.3 (L) 3.5 - 5.1 mmol/L   Chloride 91 (L) 98 - 111 mmol/L   CO2 18 (L) 22 - 32 mmol/L   Glucose, Bld 370 (H) 70 - 99 mg/dL    Comment: Glucose reference range applies only to samples taken after fasting for at least 8 hours.   BUN 46 (H) 8 - 23 mg/dL   Creatinine, Ser 0.34 (H) 0.61 - 1.24 mg/dL   Calcium 7.3 (L) 8.9 - 10.3 mg/dL   GFR, Estimated 15 (L) >60 mL/min    Comment: (NOTE) Calculated using the CKD-EPI Creatinine Equation (2021)    Anion gap 14 5 - 15    Comment: Performed at [facility], [address] [place], [place] [address]  Magnesium     Status: None   Collection Time: 05/17/23 12:17 AM  Result Value Ref Range   Magnesium 2.2 1.7 - 2.4 mg/dL    Comment: Performed at [facility], [address] [place], [place] [address]  CBC     Status: Abnormal   Collection Time: 05/17/23 12:17 AM  Result Value Ref Range   WBC 8.1 4.0 - 10.5 K/uL   RBC 3.34 (L) 4.22 - 5.81 MIL/uL   Hemoglobin 10.6 (L) 13.0 - 17.0 g/dL   HCT 56.4 (L) 33.2 - 95.1 %   MCV 94.0 80.0 - 100.0 fL   MCH 31.7 26.0 - 34.0 pg   MCHC 33.8 30.0 - 36.0 g/dL   RDW 88.4 16.6 - 06.3 %   Platelets 167 150 - 400 K/uL   nRBC 0.0 0.0 - 0.2 %    Comment: Performed at [facility], [address], [place], [place] [address]  Protime-INR     Status: Abnormal   Collection Time: 05/17/23  4:20 AM  Result Value Ref Range   Prothrombin Time >90.0 (H) 11.4 - 15.2 seconds   INR >10.0 (HH) 0.8 - 1.2    Comment: REPEATED TO VERIFY CRITICAL RESULT CALLED TO, READ BACK BY AND VERIFIED WITH: JUVE REYES, RN 05/17/2023 0624 BTAYLOR (NOTE) INR goal varies based on device and disease states. Performed at [facility],  [address], [place], [place] [address]   APTT     Status: Abnormal   Collection Time: 05/17/23  4:20 AM  Result Value Ref Range   aPTT 99 (H) 24 - 36 seconds    Comment:        IF BASELINE aPTT IS ELEVATED, SUGGEST PATIENT RISK ASSESSMENT BE USED TO DETERMINE APPROPRIATE ANTICOAGULANT THERAPY. Performed  at [facility], [address] [place], [place] [address]   Cooxemetry Panel (carboxy, met, total hgb, O2 sat)     Status: Abnormal   Collection Time: 05/17/23  4:42 AM  Result Value Ref Range   Total hemoglobin 11.4 (L) 12.0 - 16.0 g/dL   O2 Saturation 25.9 %   Carboxyhemoglobin 2.5 (H) 0.5 - 1.5 %   Methemoglobin 0.9 0.0 - 1.5 %    Comment: Performed at [facility], [address], [place], [place] [address]  Hepatic function panel     Status: Abnormal   Collection Time: 05/17/23 12:12 PM  Result Value Ref Range   Total Protein 4.8 (L) 6.5 - 8.1 g/dL   Albumin 2.4 (L) 3.5 - 5.0 g/dL   AST 5,643 (H) 15 - 41 U/L    Comment: RESULT CONFIRMED BY MANUAL DILUTION   ALT 2,319 (H) 0 - 44 U/L    Comment: RESULT CONFIRMED BY MANUAL DILUTION   Alkaline Phosphatase 160 (H) 38 - 126 U/L   Total Bilirubin 3.5 (H) 0.3 - 1.2 mg/dL   Bilirubin, Direct 1.8 (H) 0.0 - 0.2 mg/dL   Indirect Bilirubin 1.7 (H) 0.3 - 0.9 mg/dL    Comment: Performed at [facility], [address] [place], [place] [address]  APTT     Status: Abnormal   Collection Time: 05/17/23  4:39 PM  Result Value Ref Range   aPTT 73 (H) 24 - 36 seconds    Comment:        IF BASELINE aPTT IS ELEVATED, SUGGEST PATIENT RISK ASSESSMENT BE USED TO DETERMINE APPROPRIATE ANTICOAGULANT THERAPY. REPEATED TO VERIFY Performed at [facility], [address] [place], [place] [address]   Protime-INR     Status: Abnormal   Collection Time: 05/17/23  4:39 PM  Result Value Ref Range   Prothrombin Time >90.0 (H) 11.4 - 15.2 seconds   INR >10.0 (HH) 0.8 - 1.2    Comment: REPEATED TO VERIFY CRITICAL RESULT  CALLED TO, READ BACK BY AND VERIFIED WITH:  Critical called to Benay Pillow, RN @ [phone] 05/17/2023 by Lafonda Mosses, results are consistent with previous values per RN (NOTE) INR goal varies based on device and disease states. Performed at [facility], [address], [place], [place] [address]   Cooxemetry Panel (carboxy, met, total hgb, O2 sat)     Status: Abnormal   Collection Time: 05/18/23  3:26 AM  Result Value Ref Range   Total hemoglobin 11.9 (L) 12.0 - 16.0 g/dL   O2 Saturation 09.3 %   Carboxyhemoglobin 0.9 0.5 - 1.5 %   Methemoglobin <0.7 0.0 - 1.5 %    Comment: Performed at [facility], [address], [place], [place] [address]  Basic metabolic panel     Status: Abnormal   Collection Time: 05/18/23  3:26 AM  Result Value Ref Range   Sodium 126 (L) 135 - 145 mmol/L   Potassium 2.9 (L) 3.5 - 5.1 mmol/L   Chloride 87 (L) 98 - 111 mmol/L   CO2 22 22 - 32 mmol/L   Glucose, Bld 368 (H) 70 - 99 mg/dL    Comment: Glucose reference range applies only to samples taken after fasting for at least 8 hours.   BUN 43 (H) 8 - 23 mg/dL   Creatinine, Ser 3.22 (H) 0.61 - 1.24 mg/dL   Calcium 7.7 (L) 8.9 - 10.3 mg/dL   GFR, Estimated 20 (L) >60 mL/min  Comment: (NOTE) Calculated using the CKD-EPI Creatinine Equation (2021)    Anion gap 17 (H) 5 - 15    Comment: Performed at [facility], [address], [place], [place] [address]  Magnesium     Status: None   Collection Time: 05/18/23  3:26 AM  Result Value Ref Range   Magnesium 1.8 1.7 - 2.4 mg/dL    Comment: Performed at [facility], [address] [place], [place] [address]  CBC     Status: Abnormal   Collection Time: 05/18/23  3:26 AM  Result Value Ref Range   WBC 9.0 4.0 - 10.5 K/uL   RBC 3.60 (L) 4.22 - 5.81 MIL/uL   Hemoglobin 11.4 (L) 13.0 - 17.0 g/dL   HCT 84.1 (L) 32.4 - 40.1 %   MCV 91.4 80.0 - 100.0 fL   MCH 31.7 26.0 - 34.0 pg   MCHC 34.7 30.0 - 36.0 g/dL   RDW 02.7 25.3 - 66.4 %    Platelets 155 150 - 400 K/uL   nRBC 0.0 0.0 - 0.2 %    Comment: Performed at [facility], [address], [place], [place] [address]  APTT     Status: Abnormal   Collection Time: 05/18/23  3:26 AM  Result Value Ref Range   aPTT 66 (H) 24 - 36 seconds    Comment:        IF BASELINE aPTT IS ELEVATED, SUGGEST PATIENT RISK ASSESSMENT BE USED TO DETERMINE APPROPRIATE ANTICOAGULANT THERAPY. Performed at [facility], [address], [place], [place] [address]   Protime-INR     Status: Abnormal   Collection Time: 05/18/23  3:26 AM  Result Value Ref Range   Prothrombin Time 55.3 (H) 11.4 - 15.2 seconds   INR 6.2 (HH) 0.8 - 1.2    Comment: REPEATED TO VERIFY CRITICAL RESULT CALLED TO, READ BACK BY AND VERIFIED WITH: HUEY REYES RN 05/18/23 0425 SGALLOWAY (NOTE) INR goal varies based on device and disease states. Performed at [facility], [address], [place], [place] [address]   Hepatic function panel     Status: Abnormal   Collection Time: 05/18/23  3:26 AM  Result Value Ref Range   Total Protein 4.6 (L) 6.5 - 8.1 g/dL   Albumin 2.3 (L) 3.5 - 5.0 g/dL   AST 6,433 (H) 15 - 41 U/L   ALT 1,874 (H) 0 - 44 U/L   Alkaline Phosphatase 175 (H) 38 - 126 U/L   Total Bilirubin 4.0 (H) 0.3 - 1.2 mg/dL   Bilirubin, Direct 1.9 (H) 0.0 - 0.2 mg/dL   Indirect Bilirubin 2.1 (H) 0.3 - 0.9 mg/dL    Comment: Performed at [facility], [address], [place], [place] [address]  ECHOCARDIOGRAM COMPLETE     Status: None   Collection Time: 05/18/23  8:32 AM  Result Value Ref Range   Weight 2,649.05 oz   Height 68 in   BP 100/56 mmHg   S' Lateral 5.30 cm   AR max vel 2.06 cm2   AV Area VTI 1.95 cm2   AV Mean grad 7.5 mmHg   AV Peak grad 11.3 mmHg   Ao pk vel 1.68 m/s   P 1/2 time 632 msec   Area-P 1/2 4.41 cm2   AV Area mean vel 2.16 cm2   Est EF < 20%   Basic metabolic panel     Status: Abnormal   Collection Time: 05/18/23 12:03  PM  Result Value Ref Range    Sodium 130 (L) 135 - 145 mmol/L   Potassium 4.0 3.5 - 5.1 mmol/L   Chloride 95 (L) 98 - 111 mmol/L   CO2 22 22 - 32 mmol/L   Glucose, Bld 238 (H) 70 - 99 mg/dL    Comment: Glucose reference range applies only to samples taken after fasting for at least 8 hours.   BUN 42 (H) 8 - 23 mg/dL   Creatinine, Ser 0.98 (H) 0.61 - 1.24 mg/dL   Calcium 8.0 (L) 8.9 - 10.3 mg/dL   GFR, Estimated 23 (L) >60 mL/min    Comment: (NOTE) Calculated using the CKD-EPI Creatinine Equation (2021)    Anion gap 13 5 - 15    Comment: Performed at [facility], [address], [place], [place] [address]  Protime-INR     Status: Abnormal   Collection Time: 05/18/23 12:03 PM  Result Value Ref Range   Prothrombin Time 34.9 (H) 11.4 - 15.2 seconds   INR 3.4 (H) 0.8 - 1.2    Comment: (NOTE) INR goal varies based on device and disease states. Performed at [facility], [address], [place], [place] [address]   APTT     Status: Abnormal   Collection Time: 05/18/23 12:03 PM  Result Value Ref Range   aPTT 51 (H) 24 - 36 seconds    Comment:        IF BASELINE aPTT IS ELEVATED, SUGGEST PATIENT RISK ASSESSMENT BE USED TO DETERMINE APPROPRIATE ANTICOAGULANT THERAPY. Performed at [facility], [address], [place], [place] [address]   Cooxemetry Panel (carboxy, met, total hgb, O2 sat)     Status: Abnormal   Collection Time: 05/18/23 12:35 PM  Result Value Ref Range   Total hemoglobin 9.1 (L) 12.0 - 16.0 g/dL   O2 Saturation 86.5 %   Carboxyhemoglobin 1.1 0.5 - 1.5 %   Methemoglobin <0.7 0.0 - 1.5 %    Comment: Performed at [facility], [address] [place], [place] [address]  Glucose, capillary     Status: Abnormal   Collection Time: 05/18/23  5:35 PM  Result Value Ref Range   Glucose-Capillary 160 (H) 70 - 99 mg/dL    Comment: Glucose reference range applies only to samples taken after fasting for at least 8 hours.  APTT     Status: Abnormal   Collection Time: 05/18/23   8:17 PM  Result Value Ref Range   aPTT 55 (H) 24 - 36 seconds    Comment:        IF BASELINE aPTT IS ELEVATED, SUGGEST PATIENT RISK ASSESSMENT BE USED TO DETERMINE APPROPRIATE ANTICOAGULANT THERAPY. Performed at [facility], [address], [place], [place] [address]   Glucose, capillary     Status: Abnormal   Collection Time: 05/18/23  8:28 PM  Result Value Ref Range   Glucose-Capillary 211 (H) 70 - 99 mg/dL    Comment: Glucose reference range applies only to samples taken after fasting for at least 8 hours.  APTT     Status: Abnormal   Collection Time: 05/19/23 12:11 AM  Result Value Ref Range   aPTT 55 (H) 24 - 36 seconds    Comment:        IF BASELINE aPTT IS ELEVATED, SUGGEST PATIENT RISK ASSESSMENT BE USED TO DETERMINE APPROPRIATE ANTICOAGULANT THERAPY. Performed at [facility], [address], [place], [place] [address]   APTT  Status: Abnormal   Collection Time: 05/19/23  4:29 AM  Result Value Ref Range   aPTT 51 (H) 24 - 36 seconds    Comment:        IF BASELINE aPTT IS ELEVATED, SUGGEST PATIENT RISK ASSESSMENT BE USED TO DETERMINE APPROPRIATE ANTICOAGULANT THERAPY. Performed at [facility], [address] [place], [place] [address]   Basic metabolic panel     Status: Abnormal   Collection Time: 05/19/23  4:30 AM  Result Value Ref Range   Sodium 131 (L) 135 - 145 mmol/L   Potassium 3.8 3.5 - 5.1 mmol/L   Chloride 97 (L) 98 - 111 mmol/L   CO2 25 22 - 32 mmol/L   Glucose, Bld 177 (H) 70 - 99 mg/dL    Comment: Glucose reference range applies only to samples taken after fasting for at least 8 hours.   BUN 43 (H) 8 - 23 mg/dL   Creatinine, Ser 6.04 (H) 0.61 - 1.24 mg/dL   Calcium 7.7 (L) 8.9 - 10.3 mg/dL   GFR, Estimated 27 (L) >60 mL/min    Comment: (NOTE) Calculated using the CKD-EPI Creatinine Equation (2021)    Anion gap 9 5 - 15    Comment: Performed at [facility], [address] [place], [place] [address]  Magnesium      Status: None   Collection Time: 05/19/23  4:30 AM  Result Value Ref Range   Magnesium 1.7 1.7 - 2.4 mg/dL    Comment: Performed at [facility], [address], [place], [place] [address]  Cooxemetry Panel (carboxy, met, total hgb, O2 sat)     Status: Abnormal   Collection Time: 05/19/23  4:30 AM  Result Value Ref Range   Total hemoglobin 11.3 (L) 12.0 - 16.0 g/dL   O2 Saturation 78.2 %   Carboxyhemoglobin 1.2 0.5 - 1.5 %   Methemoglobin <0.7 0.0 - 1.5 %    Comment: Performed at [facility], [address] [place], [place] [address]  CBC     Status: Abnormal   Collection Time: 05/19/23  4:30 AM  Result Value Ref Range   WBC 9.2 4.0 - 10.5 K/uL   RBC 3.38 (L) 4.22 - 5.81 MIL/uL   Hemoglobin 10.5 (L) 13.0 - 17.0 g/dL   HCT 30.8 (L) 65.7 - 84.6 %   MCV 92.9 80.0 - 100.0 fL   MCH 31.1 26.0 - 34.0 pg   MCHC 33.4 30.0 - 36.0 g/dL   RDW 96.2 95.2 - 84.1 %   Platelets 128 (L) 150 - 400 K/uL   nRBC 0.0 0.0 - 0.2 %    Comment: Performed at [facility], [address] [place], [place] [address]  Protime-INR     Status: Abnormal   Collection Time: 05/19/23  4:30 AM  Result Value Ref Range   Prothrombin Time 23.9 (H) 11.4 - 15.2 seconds   INR 2.1 (H) 0.8 - 1.2    Comment: (NOTE) INR goal varies based on device and disease states. Performed at [facility], [address], [place], [place] [address]   Hepatic function panel     Status: Abnormal   Collection Time: 05/19/23  4:30 AM  Result Value Ref Range   Total Protein 4.6 (L) 6.5 - 8.1 g/dL   Albumin 2.3 (L) 3.5 - 5.0 g/dL   AST 536 (H) 15 - 41 U/L   ALT 1,333 (H) 0 - 44 U/L  Alkaline Phosphatase 162 (H) 38 - 126 U/L   Total Bilirubin 4.1 (H) 0.3 - 1.2 mg/dL   Bilirubin, Direct 2.0 (H) 0.0 - 0.2 mg/dL   Indirect Bilirubin 2.1 (H) 0.3 - 0.9 mg/dL    Comment: Performed at [facility], [address] [place], [place] [address]  Hemoglobin A1c     Status: Abnormal   Collection Time: 05/19/23   4:30 AM  Result Value Ref Range   Hgb A1c MFr Bld 6.0 (H) 4.8 - 5.6 %    Comment: (NOTE) Pre diabetes:          5.7%-6.4%  Diabetes:              >6.4%  Glycemic control for   <7.0% adults with diabetes    Mean Plasma Glucose 125.5 mg/dL    Comment: Performed at [facility], [address], [place], [place] [address]  Glucose, capillary     Status: None   Collection Time: 05/19/23  8:52 AM  Result Value Ref Range   Glucose-Capillary 87 70 - 99 mg/dL    Comment: Glucose reference range applies only to samples taken after fasting for at least 8 hours.  Cooxemetry Panel (carboxy, met, total hgb, O2 sat)     Status: Abnormal   Collection Time: 05/19/23 11:42 AM  Result Value Ref Range   Total hemoglobin 11.4 (L) 12.0 - 16.0 g/dL   O2 Saturation 82.9 %   Carboxyhemoglobin 1.9 (H) 0.5 - 1.5 %   Methemoglobin <0.7 0.0 - 1.5 %    Comment: Performed at [facility], [address], [place], [place] [address]  Glucose, capillary     Status: None   Collection Time: 05/19/23 11:55 AM  Result Value Ref Range   Glucose-Capillary 93 70 - 99 mg/dL    Comment: Glucose reference range applies only to samples taken after fasting for at least 8 hours.  Glucose, capillary     Status: Abnormal   Collection Time: 05/19/23  4:27 PM  Result Value Ref Range   Glucose-Capillary 148 (H) 70 - 99 mg/dL    Comment: Glucose reference range applies only to samples taken after fasting for at least 8 hours.  APTT     Status: Abnormal   Collection Time: 05/19/23  4:47 PM  Result Value Ref Range   aPTT >200 (HH) 24 - 36 seconds    Comment:        IF BASELINE aPTT IS ELEVATED, SUGGEST PATIENT RISK ASSESSMENT BE USED TO DETERMINE APPROPRIATE ANTICOAGULANT THERAPY. REPEATED TO VERIFY CRITICAL RESULT CALLED TO, READ BACK BY AND VERIFIED WITH: K.MCCOLLUM RN 05/19/2023 1753 BNUNNERY CREDITTED SPECIMEN CONTAMINATED, UNABLE TO PERFORM TEST(S). Performed at [facility], [address]  [place], [place] [address]   Rapid urine drug screen (hospital performed)     Status: None   Collection Time: 05/19/23  6:06 PM  Result Value Ref Range   Opiates NONE DETECTED NONE DETECTED   Cocaine NONE DETECTED NONE DETECTED   Benzodiazepines NONE DETECTED NONE DETECTED   Amphetamines NONE DETECTED NONE DETECTED   Tetrahydrocannabinol NONE DETECTED NONE DETECTED   Barbiturates NONE DETECTED NONE DETECTED    Comment: (NOTE) DRUG SCREEN FOR MEDICAL PURPOSES ONLY.  IF CONFIRMATION IS NEEDED FOR ANY PURPOSE, NOTIFY LAB WITHIN 5 DAYS.  LOWEST DETECTABLE LIMITS FOR URINE DRUG SCREEN Drug Class  Cutoff (ng/mL) Amphetamine and metabolites    1000 Barbiturate and metabolites    200 Benzodiazepine                 200 Opiates and metabolites        300 Cocaine and metabolites        300 THC                            50 Performed at [facility], [address], [place], [place] [address]   APTT     Status: Abnormal   Collection Time: 05/19/23  6:06 PM  Result Value Ref Range   aPTT 46 (H) 24 - 36 seconds    Comment:        IF BASELINE aPTT IS ELEVATED, SUGGEST PATIENT RISK ASSESSMENT BE USED TO DETERMINE APPROPRIATE ANTICOAGULANT THERAPY. RESULTS VERIFIED VIA RECOLLECT Performed at [facility], [address] [place], [place] [address]   Glucose, capillary     Status: Abnormal   Collection Time: 05/19/23  9:07 PM  Result Value Ref Range   Glucose-Capillary 110 (H) 70 - 99 mg/dL    Comment: Glucose reference range applies only to samples taken after fasting for at least 8 hours.  I-STAT 7, (LYTES, BLD GAS, ICA, H+H)     Status: Abnormal   Collection Time: 05/19/23 11:25 PM  Result Value Ref Range   pH, Arterial 7.471 (H) 7.35 - 7.45   pCO2 arterial 31.3 (L) 32 - 48 mmHg   pO2, Arterial 92 83 - 108 mmHg   Bicarbonate 22.8 20.0 - 28.0 mmol/L   TCO2 24 22 - 32 mmol/L   O2 Saturation 98 %   Acid-Base Excess 0.0 0.0 - 2.0 mmol/L   Sodium 131 (L)  135 - 145 mmol/L   Potassium 4.3 3.5 - 5.1 mmol/L   Calcium, Ion 1.10 (L) 1.15 - 1.40 mmol/L   HCT 32.0 (L) 39.0 - 52.0 %   Hemoglobin 10.9 (L) 13.0 - 17.0 g/dL   Patient temperature 09.8 F    Collection site RADIAL, ALLEN'S TEST ACCEPTABLE    Drawn by HIDE    Sample type ARTERIAL   APTT     Status: Abnormal   Collection Time: 05/20/23 12:12 AM  Result Value Ref Range   aPTT 49 (H) 24 - 36 seconds    Comment:        IF BASELINE aPTT IS ELEVATED, SUGGEST PATIENT RISK ASSESSMENT BE USED TO DETERMINE APPROPRIATE ANTICOAGULANT THERAPY. Performed at [facility], [address], [place], [place] [address]   Basic metabolic panel     Status: Abnormal   Collection Time: 05/20/23  4:21 AM  Result Value Ref Range   Sodium 130 (L) 135 - 145 mmol/L   Potassium 4.3 3.5 - 5.1 mmol/L   Chloride 95 (L) 98 - 111 mmol/L   CO2 24 22 - 32 mmol/L   Glucose, Bld 199 (H) 70 - 99 mg/dL    Comment: Glucose reference range applies only to samples taken after fasting for at least 8 hours.   BUN 38 (H) 8 - 23 mg/dL   Creatinine, Ser 7.82 (H) 0.61 - 1.24 mg/dL   Calcium 7.6 (L) 8.9 - 10.3 mg/dL   GFR, Estimated 43 (L) >60 mL/min    Comment: (NOTE) Calculated using the CKD-EPI Creatinine Equation (2021)    Anion gap 11 5 - 15    Comment:  Performed at [facility], [address], [place], [place] [address]  Magnesium     Status: None   Collection Time: 05/20/23  4:21 AM  Result Value Ref Range   Magnesium 2.0 1.7 - 2.4 mg/dL    Comment: Performed at [facility], [address], [place], [place] [address]  Cooxemetry Panel (carboxy, met, total hgb, O2 sat)     Status: Abnormal   Collection Time: 05/20/23  4:21 AM  Result Value Ref Range   Total hemoglobin 11.2 (L) 12.0 - 16.0 g/dL   O2 Saturation 09.8 %   Carboxyhemoglobin 2.7 (H) 0.5 - 1.5 %   Methemoglobin 0.9 0.0 - 1.5 %    Comment: Performed at [facility], [address], [place], [place] [address]  CBC     Status:  Abnormal   Collection Time: 05/20/23  4:21 AM  Result Value Ref Range   WBC 10.4 4.0 - 10.5 K/uL   RBC 3.39 (L) 4.22 - 5.81 MIL/uL   Hemoglobin 10.9 (L) 13.0 - 17.0 g/dL   HCT 78.2 (L) 95.6 - 21.3 %   MCV 95.3 80.0 - 100.0 fL   MCH 32.2 26.0 - 34.0 pg   MCHC 33.7 30.0 - 36.0 g/dL   RDW 08.6 57.8 - 46.9 %   Platelets 125 (L) 150 - 400 K/uL   nRBC 0.0 0.0 - 0.2 %    Comment: Performed at [facility], [address], [place], [place] [address]  Protime-INR     Status: Abnormal   Collection Time: 05/20/23  4:21 AM  Result Value Ref Range   Prothrombin Time 19.3 (H) 11.4 - 15.2 seconds   INR 1.6 (H) 0.8 - 1.2    Comment: (NOTE) INR goal varies based on device and disease states. Performed at [facility], [address] [place], [place] [address]   APTT     Status: Abnormal   Collection Time: 05/20/23  4:21 AM  Result Value Ref Range   aPTT 51 (H) 24 - 36 seconds    Comment:        IF BASELINE aPTT IS ELEVATED, SUGGEST PATIENT RISK ASSESSMENT BE USED TO DETERMINE APPROPRIATE ANTICOAGULANT THERAPY. Performed at [facility], [address], [place], [place] [address]   Lactate dehydrogenase     Status: Abnormal   Collection Time: 05/20/23  4:21 AM  Result Value Ref Range   LDH 197 (H) 98 - 192 U/L    Comment: Performed at [facility], [address], [place], [place] [address]  Antithrombin III     Status: Abnormal   Collection Time: 05/20/23  4:21 AM  Result Value Ref Range   AntiThromb III Func 46 (L) 75 - 120 %    Comment: Performed at [facility], [address], [place], [place] [address]  Factor 5 leiden     Status: None   Collection Time: 05/20/23  4:21 AM  Result Value Ref Range   Recommendations-F5LEID: Comment     Comment: (NOTE) Result: c.1601G>A (p.Arg534Gln) - Not Detected This result is not associated with an increased risk for venous thromboembolism. See Additional Clinical Information and Comments. Additional Clinical  Information: Venous thromboembolism is a multifactorial disease influenced by genetic, environmental, and circumstantial risk factors. The c.1601G>A (p. Arg534Gln) variant in the F5 gene, commonly referred to as Factor V Leiden, is a genetic risk factor for venous thromboembolism. Heterozygous carriers of this variant  have a 6- to 8- fold increased risk for venous thromboembolism. Individuals homozygous for this variant (ie, with a copy of the variant on each chromosome) have an approximately 80-fold increased risk for venous thromboembolism. Individuals who carry both a c.*97G>A variant in the F2 gene and Factor V Leiden have an approximately 20-fold increased risk for venous thromboembolism. Risks are likely to be even higher in more complex genotype combinations in volving the F2 c.*97G>A variant and Factor V Leiden (PMID: 16109604). Additional risk factors include but are not limited to: deficiency of protein C, protein S, or antithrombin III, age, male sex, personal or family history of deep vein thromboembolism, smoking, surgery, prolonged immobilization, malignant neoplasm, tamoxifen treatment, raloxifene treatment, oral contraceptive use, hormone replacement therapy, and pregnancy. Management of thrombotic risk and thrombotic events should follow established guidelines and fit the clinical circumstance. This result cannot predict the occurrence or recurrence of a thrombotic event. Comment: Genetic counseling is recommended to discuss the potential clinical implications of positive results, as well as recommendations for testing family members. Genetic Coordinators are available for health care providers to discuss results at [phone]). Test Details: Variant Analyzed: c.1601G>A (p. Arg534Gln), referred to as Fact or V Leiden Methods/Limitations: DNA analysis of the F5 gene (NM_000130.5) was performed by PCR amplification followed by restriction enzyme analysis.  The diagnostic sensitivity is >99%. Results must be combined with clinical information for the most accurate interpretation. Molecular- based testing is highly accurate, but as in any laboratory test, diagnostic errors may occur. False positive or false negative results may occur for reasons that include genetic variants, blood transfusions, bone marrow transplantation, somatic or tissue-specific mosaicism, mislabeled samples, or erroneous representation of family relationships. This test was developed and its performance characteristics determined by [facility]. It has not been cleared or approved by the Food and Drug Administration. References: [facility], Valentina Lucks [facility]; [facility] Professional Practice and Guidelines Committee. Addendum: [employer] of Medical Genetics consensus statement on fac tor V Leiden mutation testing. Genet Med. 2021 Mar 5. doi: 81.1914/N82956-213- 01108-x. PMID: 08657846. Cherrie Gauze. Factor V Leiden Thrombophilia. 1999 May 14 (Updated 2018 Jan 4). In: Bufford Buttner, Ardinger HH, Pagon RA, et al., editors. GeneReviews(R) (Internet). [address] ([place]): [place], [place]; 9629-5284. Available from: [email or web address].org/ Nita Sickle, Blanca Friend, Alvie Heidelberg CS; [facility] Laboratory Quality Assurance Committee. Venous thromboembolism laboratory testing (factor V Leiden and factor II c.*97G>A), 2018 update: a technical standard of the [employer] of [employer] and [employer] ([facility]). Genet Med. 2018 Dec;20(12):1489-1498. doi: 10.1038/[id number]-z. Epub 2018 Oct 5. PMID: 13244010.    Reviewed By: Comment     Comment: (NOTE) Technical Component performed at [employer] [facility] Professional Component performed by: [employer] Lowella Dell, Ph.D., [facility] Director, Molecular Genetics [address] [place] [address] Performed At: [facility] [address] [place], [place] [id number] Maurine Simmering MDPhD [id number]   Hepatitis B core antibody, total     Status: None   Collection Time: 05/20/23  4:21 AM  Result Value Ref Range   Hep B Core Total Ab NON REACTIVE NON REACTIVE    Comment: Performed at [facility], [address] [place], [place] [address]  Hepatitis B surface antibody,qualitative     Status: None   Collection Time: 05/20/23  4:21 AM  Result Value Ref Range   Hep B S Ab NON REACTIVE NON REACTIVE    Comment: (NOTE)  Inconsistent with immunity, less than 10 mIU/mL.  Performed at [facility], [address], [place], [place] [address]   Hepatitis B surface antibody,quantitative     Status: Abnormal   Collection Time: 05/20/23  4:21 AM  Result Value Ref Range   Hep B S AB Quant (Post) <3.5 (L) Immunity>10 mIU/mL    Comment: (NOTE)  Status of Immunity                     Anti-HBs Level  ------------------                     -------------- Inconsistent with Immunity                  0.0 - 10.0 Consistent with Immunity                         >10.0 Performed At: [facility] [address] [place], [place] [id number] Jolene Schimke MD MW:[id number]   Hepatitis B surface antigen     Status: None   Collection Time: 05/20/23  4:21 AM  Result Value Ref Range   Hepatitis B Surface Ag NON REACTIVE NON REACTIVE    Comment: Performed at [facility], [address] [place], [place] [address]  Lupus anticoagulant panel     Status: Abnormal   Collection Time: 05/20/23  4:21 AM  Result Value Ref Range   PTT Lupus Anticoagulant 55.0 (H) 0.0 - 43.5 sec   DRVVT 200.4 (H) 0.0 - 47.0 sec   Lupus Anticoag Interp No reportable result     Comment: (NOTE) Performed At: [facility] [address] [place], [place] [id number] Jolene Schimke MD FI:[id number]   Prealbumin     Status: Abnormal   Collection Time: 05/20/23  4:21 AM  Result Value Ref Range   Prealbumin 6 (L) 18 - 38 mg/dL     Comment: Performed at [facility], [address], [place], [place] [address]  PSA     Status: None   Collection Time: 05/20/23  4:21 AM  Result Value Ref Range   Prostatic Specific Antigen 0.94 0.00 - 4.00 ng/mL    Comment: (NOTE) While PSA levels of <=4.00 ng/ml are reported as reference range, some men with levels below 4.00 ng/ml can have prostate cancer and many men with PSA above 4.00 ng/ml do not have prostate cancer.  Other tests such as free PSA, age specific reference ranges, PSA velocity and PSA doubling time may be helpful especially in men less than 8 years old. Performed at [facility], [address], [place], [place] [address]   T4, free     Status: Abnormal   Collection Time: 05/20/23  4:21 AM  Result Value Ref Range   Free T4 1.36 (H) 0.61 - 1.12 ng/dL    Comment: (NOTE) Biotin ingestion may interfere with free T4 tests. If the results are inconsistent with the TSH level, previous test results, or the clinical presentation, then consider biotin interference. If needed, order repeat testing after stopping biotin. Performed at [facility], [address] [place], [place] [address]   Uric acid     Status: None   Collection Time: 05/20/23  4:21 AM  Result Value Ref Range   Uric Acid, Serum 5.5 3.7 - 8.6 mg/dL    Comment: HEMOLYSIS AT THIS LEVEL MAY AFFECT RESULT Performed at [facility],  [address], [place], [place] [address]   HIV Antibody (routine testing w rflx)     Status: None   Collection Time: 05/20/23  4:21 AM  Result Value Ref Range   HIV Screen 4th Generation wRfx Non Reactive Non Reactive    Comment: Performed at [facility], [address], [place], [place] [address]  PTT-LA Mix     Status: Abnormal   Collection Time: 05/20/23  4:21 AM  Result Value Ref Range   PTT-LA Mix 52.0 (H) 0.0 - 40.5 sec    Comment: (NOTE) Performed At: [facility] [address] [place], [place] [id number] Jolene Schimke  MD [id number]   Hexagonal Phase Phospholipid     Status: None   Collection Time: 05/20/23  4:21 AM  Result Value Ref Range   Hexagonal Phase Phospholipid 5 0 - 11 sec    Comment: (NOTE) Performed At: [facility] [address] [place], [place] [id number] Jolene Schimke MD IH:[id number]   dRVVT Mix     Status: Abnormal   Collection Time: 05/20/23  4:21 AM  Result Value Ref Range   dRVVT Mix 69.6 (H) 0.0 - 40.4 sec    Comment: (NOTE) Performed At: [facility] [address] [place], [place] [id number] Jolene Schimke MD [id number]   dRVVT Confirm     Status: None   Collection Time: 05/20/23  4:21 AM  Result Value Ref Range   dRVVT Confirm No reportable result 0.8 - 1.2 ratio    Comment: (NOTE) Performed At: [facility] [address] [place], [place] [id number] Jolene Schimke MD [id number]   ABO/Rh     Status: None   Collection Time: 05/20/23  4:39 AM  Result Value Ref Range   ABO/RH(D)      A POS Performed at [facility], [address], [place], [place] [address]   Glucose, capillary     Status: Abnormal   Collection Time: 05/20/23  8:29 AM  Result Value Ref Range   Glucose-Capillary 167 (H) 70 - 99 mg/dL    Comment: Glucose reference range applies only to samples taken after fasting for at least 8 hours.  Glucose, capillary     Status: Abnormal   Collection Time: 05/20/23 12:00 PM  Result Value Ref Range   Glucose-Capillary 144 (H) 70 - 99 mg/dL    Comment: Glucose reference range applies only to samples taken after fasting for at least 8 hours.  Pulmonary Function Test     Status: None   Collection Time: 05/20/23 12:04 PM  Result Value Ref Range   FVC-Pre 3.28 L   FVC-%Pred-Pre 83 %   FEV1-Pre 2.11 L   FEV1-%Pred-Pre 75 %   FEV6-Pre 3.24 L   FEV6-%Pred-Pre 88 %   Pre FEV1/FVC ratio 64 %   FEV1FVC-%Pred-Pre 88 %   Pre FEV6/FVC Ratio 99 %   FEV6FVC-%Pred-Pre 105 %   FEF 25-75 Pre 1.23 L/sec   FEF2575-%Pred-Pre 60 %    RV 2.01 L   RV % pred 82 %   TLC 5.23 L   TLC % pred 78 %   DLCO unc 9.48 ml/min/mmHg   DLCO unc % pred 40 %   DLCO cor 9.83 ml/min/mmHg   DLCO cor % pred 41 %   DL/VA 2.83 ml/min/mmHg/L   DL/VA % pred 57 %  VAS US DOPPLER PRE VAD     Status: None   Collection Time: 05/20/23  1:45 PM  Result Value Ref Range  Right ABI 1.44    Left ABI 1.27   Glucose, capillary     Status: Abnormal   Collection Time: 05/20/23  4:10 PM  Result Value Ref Range   Glucose-Capillary 126 (H) 70 - 99 mg/dL    Comment: Glucose reference range applies only to samples taken after fasting for at least 8 hours.  APTT     Status: Abnormal   Collection Time: 05/20/23  4:39 PM  Result Value Ref Range   aPTT 40 (H) 24 - 36 seconds    Comment:        IF BASELINE aPTT IS ELEVATED, SUGGEST PATIENT RISK ASSESSMENT BE USED TO DETERMINE APPROPRIATE ANTICOAGULANT THERAPY. Performed at [facility], [address] [place], [place] [address]   Glucose, capillary     Status: Abnormal   Collection Time: 05/20/23  9:03 PM  Result Value Ref Range   Glucose-Capillary 122 (H) 70 - 99 mg/dL    Comment: Glucose reference range applies only to samples taken after fasting for at least 8 hours.  APTT     Status: Abnormal   Collection Time: 05/20/23 11:41 PM  Result Value Ref Range   aPTT 45 (H) 24 - 36 seconds    Comment:        IF BASELINE aPTT IS ELEVATED, SUGGEST PATIENT RISK ASSESSMENT BE USED TO DETERMINE APPROPRIATE ANTICOAGULANT THERAPY. Performed at [facility], [address] [place], [place] [address]   Cooxemetry Panel (carboxy, met, total hgb, O2 sat)     Status: Abnormal   Collection Time: 05/21/23  3:19 AM  Result Value Ref Range   Total hemoglobin 10.6 (L) 12.0 - 16.0 g/dL   O2 Saturation 62.1 %   Carboxyhemoglobin 2.1 (H) 0.5 - 1.5 %   Methemoglobin <0.7 0.0 - 1.5 %    Comment: Performed at [facility], [address] [name], [place] [address]  Basic metabolic panel      Status: Abnormal   Collection Time: 05/21/23  3:21 AM  Result Value Ref Range   Sodium 131 (L) 135 - 145 mmol/L   Potassium 3.7 3.5 - 5.1 mmol/L   Chloride 100 98 - 111 mmol/L   CO2 25 22 - 32 mmol/L   Glucose, Bld 122 (H) 70 - 99 mg/dL    Comment: Glucose reference range applies only to samples taken after fasting for at least 8 hours.   BUN 36 (H) 8 - 23 mg/dL   Creatinine, Ser 7.84 (H) 0.61 - 1.24 mg/dL   Calcium 7.6 (L) 8.9 - 10.3 mg/dL   GFR, Estimated 44 (L) >60 mL/min    Comment: (NOTE) Calculated using the CKD-EPI Creatinine Equation (2021)    Anion gap 6 5 - 15    Comment: Performed at [facility], [address] [place], [place] [address]  CBC     Status: Abnormal   Collection Time: 05/21/23  3:21 AM  Result Value Ref Range   WBC 8.4 4.0 - 10.5 K/uL   RBC 3.25 (L) 4.22 - 5.81 MIL/uL   Hemoglobin 10.3 (L) 13.0 - 17.0 g/dL   HCT 52.8 (L) 41.3 - 24.4 %   MCV 94.8 80.0 - 100.0 fL   MCH 31.7 26.0 - 34.0 pg   MCHC 33.4 30.0 - 36.0 g/dL   RDW 01.0 27.2 - 53.6 %   Platelets 120 (L) 150 - 400 K/uL   nRBC 0.0 0.0 - 0.2 %    Comment: Performed at  [facility], [address] [place] [place], [place] [address]  Protime-INR     Status: Abnormal   Collection Time: 05/21/23  3:21 AM  Result Value Ref Range   Prothrombin Time 19.1 (H) 11.4 - 15.2 seconds   INR 1.6 (H) 0.8 - 1.2    Comment: (NOTE) INR goal varies based on device and disease states. Performed at [facility], [address], [place], [place] [address]   APTT     Status: Abnormal   Collection Time: 05/21/23  3:21 AM  Result Value Ref Range   aPTT 48 (H) 24 - 36 seconds    Comment:        IF BASELINE aPTT IS ELEVATED, SUGGEST PATIENT RISK ASSESSMENT BE USED TO DETERMINE APPROPRIATE ANTICOAGULANT THERAPY. Performed at [facility], [address], [place], [place] [address]   Glucose, capillary     Status: Abnormal   Collection Time: 05/21/23  6:29 AM  Result Value Ref Range    Glucose-Capillary 134 (H) 70 - 99 mg/dL    Comment: Glucose reference range applies only to samples taken after fasting for at least 8 hours.  Miscellaneous [facility] test (send-out)     Status: None   Collection Time: 05/21/23  9:25 AM  Result Value Ref Range   [facility] test code [id number]    [facility] test name ALPHA GAL IGE PANEL     Comment: CORRECTED ON 10/03 AT 9147: PREVIOUSLY REPORTED AS SERUM   Source ([facility]) SERUM     Comment: Performed at [facility], [address], [place], [place] [address] CORRECTED ON 10/03 AT 2130: PREVIOUSLY REPORTED AS ALPHA GAL IGE PANEL    Misc [facility] result COMMENT     Comment: (NOTE) Test Ordered: 650003 Alpha-Gal IgE Panel Class Description              Comment                   BN      Levels of Specific IgE       Class  Description of Class    ---------------------------  -----  --------------------                   < 0.10         0         Negative           0.10 -    0.31         0/I       Equivocal/Low           0.32 -    0.55         I         Low           0.56 -    1.40         II        Moderate           1.41 -    3.90         III       High           3.91 -   19.00         IV        Very High          19.01 -  100.00         V  Very High                  >100.00         VI        Very High Immunoglobulin E, Total        1512        [H ] IU/mL    BN     Reference Range: 6-495                                 F026-IgE Pork                  5.36        Naevius.Petit ] kU/L     BN     Reference Range: Class IV                              F027-IgE Beef                  30.50       Naevius.Petit ] kU/L     BN     Reference Range: Class V                                F088-IgE Lamb                  6.09        Naevius.Petit ] kU/L     BN     Reference Range: Class IV                              O215-IgE Alpha-Gal             >100        Naevius.Petit ] kU/L     BN     Reference Range: Class VI                              Performed At: [facility] [place] [address] [place], [place] [id number] Jolene Schimke MD [id number]   APTT     Status: Abnormal   Collection Time: 05/21/23  2:42 PM  Result Value Ref Range   aPTT 48 (H) 24 - 36 seconds    Comment:        IF BASELINE aPTT IS ELEVATED, SUGGEST PATIENT RISK ASSESSMENT BE USED TO DETERMINE APPROPRIATE ANTICOAGULANT THERAPY. Performed at [facility], [address], [place], [place] [address]   APTT     Status: Abnormal   Collection Time: 05/21/23  6:39 PM  Result Value Ref Range   aPTT 44 (H) 24 - 36 seconds    Comment:        IF BASELINE aPTT IS ELEVATED, SUGGEST PATIENT RISK ASSESSMENT BE USED TO DETERMINE APPROPRIATE ANTICOAGULANT THERAPY. Performed at [facility], [address], [place], [place] [address]   APTT     Status: Abnormal   Collection Time: 05/21/23 10:05 PM  Result Value Ref Range   aPTT 40 (H) 24 - 36 seconds    Comment:        IF BASELINE aPTT IS ELEVATED, SUGGEST PATIENT RISK ASSESSMENT BE USED TO DETERMINE APPROPRIATE ANTICOAGULANT  THERAPY. Performed at [facility], [address], [place], [place] [address]   Basic metabolic panel     Status: Abnormal   Collection Time: 05/22/23  4:50 AM  Result Value Ref Range   Sodium 131 (L) 135 - 145 mmol/L   Potassium 4.3 3.5 - 5.1 mmol/L   Chloride 99 98 - 111 mmol/L   CO2 23 22 - 32 mmol/L   Glucose, Bld 100 (H) 70 - 99 mg/dL    Comment: Glucose reference range applies only to samples taken after fasting for at least 8 hours.   BUN 33 (H) 8 - 23 mg/dL   Creatinine, Ser 8.46 (H) 0.61 - 1.24 mg/dL   Calcium 7.7 (L) 8.9 - 10.3 mg/dL   GFR, Estimated 51 (L) >60 mL/min    Comment: (NOTE) Calculated using the CKD-EPI Creatinine Equation (2021)    Anion gap 9 5 - 15    Comment: Performed at [facility], [address], [place], [place] [address]  Cooxemetry Panel (carboxy, met, total hgb, O2 sat)     Status: Abnormal   Collection Time: 05/22/23  4:50 AM  Result Value Ref Range    Total hemoglobin 11.0 (L) 12.0 - 16.0 g/dL   O2 Saturation 28.4 %   Carboxyhemoglobin 2.8 (H) 0.5 - 1.5 %   Methemoglobin <0.7 0.0 - 1.5 %    Comment: Performed at [facility], [address] [place], [place] [address]  CBC     Status: Abnormal   Collection Time: 05/22/23  4:50 AM  Result Value Ref Range   WBC 7.0 4.0 - 10.5 K/uL   RBC 3.36 (L) 4.22 - 5.81 MIL/uL   Hemoglobin 10.5 (L) 13.0 - 17.0 g/dL   HCT 01.0 (L) 27.2 - 53.6 %   MCV 95.2 80.0 - 100.0 fL   MCH 31.3 26.0 - 34.0 pg   MCHC 32.8 30.0 - 36.0 g/dL   RDW 64.4 03.4 - 74.2 %   Platelets 125 (L) 150 - 400 K/uL   nRBC 0.0 0.0 - 0.2 %    Comment: Performed at [facility], [address] [place], [place] [address]  Protime-INR     Status: Abnormal   Collection Time: 05/22/23  4:50 AM  Result Value Ref Range   Prothrombin Time 18.2 (H) 11.4 - 15.2 seconds   INR 1.5 (H) 0.8 - 1.2    Comment: (NOTE) INR goal varies based on device and disease states. Performed at [facility], [address] [place], [place] [address]   APTT     Status: Abnormal   Collection Time: 05/22/23  4:50 AM  Result Value Ref Range   aPTT 41 (H) 24 - 36 seconds    Comment:        IF BASELINE aPTT IS ELEVATED, SUGGEST PATIENT RISK ASSESSMENT BE USED TO DETERMINE APPROPRIATE ANTICOAGULANT THERAPY. Performed at [facility], [address] [place], [place] [address]   APTT     Status: Abnormal   Collection Time: 05/22/23  9:59 AM  Result Value Ref Range   aPTT 39 (H) 24 - 36 seconds    Comment:        IF BASELINE aPTT IS ELEVATED, SUGGEST PATIENT RISK ASSESSMENT BE USED TO DETERMINE APPROPRIATE ANTICOAGULANT THERAPY. Performed at [facility], [address] [place], [place] [address]   Magnesium     Status: None   Collection Time: 05/22/23  9:59 AM  Result Value Ref Range   Magnesium 2.1 1.7 - 2.4 mg/dL    Comment: Performed at [facility], [address], [place], [place] [address]  Troponin  I (High Sensitivity)     Status: Abnormal   Collection Time: 05/22/23  8:36 PM  Result Value Ref Range   Troponin I (High Sensitivity) 22 (H) <18 ng/L    Comment: (NOTE) Elevated high sensitivity troponin I (hsTnI) values and significant  changes across serial measurements may suggest ACS but many other  chronic and acute conditions are known to elevate hsTnI results.  Refer to the "Links" section for chest pain algorithms and additional  guidance. Performed at [facility], [address] [place], [place] [address]   Cooxemetry Panel (carboxy, met, total hgb, O2 sat)     Status: Abnormal   Collection Time: 05/23/23  5:40 AM  Result Value Ref Range   Total hemoglobin 10.6 (L) 12.0 - 16.0 g/dL   O2 Saturation 13.0 %   Carboxyhemoglobin 2.9 (H) 0.5 - 1.5 %   Methemoglobin <0.7 0.0 - 1.5 %    Comment: Performed at [facility], [address] [place], [place] [address]  CBC     Status: Abnormal   Collection Time: 05/23/23  5:40 AM  Result Value Ref Range   WBC 8.5 4.0 - 10.5 K/uL   RBC 3.41 (L) 4.22 - 5.81 MIL/uL   Hemoglobin 10.9 (L) 13.0 - 17.0 g/dL   HCT 46.9 (L) 62.9 - 52.8 %   MCV 98.2 80.0 - 100.0 fL   MCH 32.0 26.0 - 34.0 pg   MCHC 32.5 30.0 - 36.0 g/dL   RDW 41.3 24.4 - 01.0 %   Platelets 149 (L) 150 - 400 K/uL   nRBC 0.0 0.0 - 0.2 %    Comment: Performed at [facility], [address] [place], [place] [address]  Basic metabolic panel     Status: Abnormal   Collection Time: 05/23/23  2:40 PM  Result Value Ref Range   Sodium 130 (L) 135 - 145 mmol/L   Potassium 4.8 3.5 - 5.1 mmol/L   Chloride 101 98 - 111 mmol/L   CO2 24 22 - 32 mmol/L   Glucose, Bld 119 (H) 70 - 99 mg/dL    Comment: Glucose reference range applies only to samples taken after fasting for at least 8 hours.   BUN 29 (H) 8 - 23 mg/dL   Creatinine, Ser 6.64 (H) 0.61 - 1.24 mg/dL   Calcium 7.6 (L) 8.9 - 10.3 mg/dL   GFR, Estimated 56 (L) >60 mL/min    Comment: (NOTE) Calculated  using the CKD-EPI Creatinine Equation (2021)    Anion gap 5 5 - 15    Comment: Performed at [facility], [address] [place], [place] [address]  Cooxemetry Panel (carboxy, met, total hgb, O2 sat)     Status: Abnormal   Collection Time: 05/24/23  2:51 AM  Result Value Ref Range   Total hemoglobin 11.0 (L) 12.0 - 16.0 g/dL   O2 Saturation 42.5 %   Carboxyhemoglobin 2.9 (H) 0.5 - 1.5 %   Methemoglobin 0.7 0.0 - 1.5 %    Comment: Performed at [facility], [address] [place], [place] [address]  CBC     Status: Abnormal   Collection Time: 05/24/23  3:51 AM  Result Value Ref Range   WBC 8.4 4.0 - 10.5 K/uL   RBC 3.38 (L)  4.22 - 5.81 MIL/uL   Hemoglobin 10.5 (L) 13.0 - 17.0 g/dL   HCT 13.0 (L) 86.5 - 78.4 %   MCV 96.4 80.0 - 100.0 fL   MCH 31.1 26.0 - 34.0 pg   MCHC 32.2 30.0 - 36.0 g/dL   RDW 69.6 29.5 - 28.4 %   Platelets 176 150 - 400 K/uL   nRBC 0.0 0.0 - 0.2 %    Comment: Performed at [facility], [address], [place], [place] [address]  Comprehensive metabolic panel     Status: Abnormal   Collection Time: 06/01/23 12:10 PM  Result Value Ref Range   Sodium 132 (L) 135 - 145 mmol/L   Potassium 4.9 3.5 - 5.1 mmol/L   Chloride 97 (L) 98 - 111 mmol/L   CO2 24 22 - 32 mmol/L   Glucose, Bld 100 (H) 70 - 99 mg/dL    Comment: Glucose reference range applies only to samples taken after fasting for at least 8 hours.   BUN 43 (H) 8 - 23 mg/dL   Creatinine, Ser 0.10 (H) 0.61 - 1.24 mg/dL   Calcium 9.0 8.9 - 27.2 mg/dL   Total Protein 6.3 (L) 6.5 - 8.1 g/dL   Albumin 2.9 (L) 3.5 - 5.0 g/dL   AST 75 (H) 15 - 41 U/L   ALT 94 (H) 0 - 44 U/L   Alkaline Phosphatase 107 38 - 126 U/L   Total Bilirubin 1.8 (H) 0.3 - 1.2 mg/dL   GFR, Estimated 43 (L) >60 mL/min    Comment: (NOTE) Calculated using the CKD-EPI Creatinine Equation (2021)    Anion gap 11 5 - 15    Comment: Performed at [facility], [address] [place], [place] [address]  B Nat Peptide      Status: Abnormal   Collection Time: 06/01/23 12:10 PM  Result Value Ref Range   B Natriuretic Peptide 790.5 (H) 0.0 - 100.0 pg/mL    Comment: Performed at [facility], [address], [place], [place] [address]  Digoxin level     Status: None   Collection Time: 06/01/23 12:10 PM  Result Value Ref Range   Digoxin Level 1.8 0.8 - 2.0 ng/mL    Comment: Performed at [facility], [address] [place], [place] [address]  B Nat Peptide     Status: Abnormal   Collection Time: 07/02/23  9:46 AM  Result Value Ref Range   B Natriuretic Peptide 1,098.6 (H) 0.0 - 100.0 pg/mL    Comment: Performed at [facility], [address] [place], [place] [address]  Prealbumin     Status: Abnormal   Collection Time: 07/02/23  9:46 AM  Result Value Ref Range   Prealbumin 16 (L) 18 - 38 mg/dL    Comment: Performed at [facility], [address], [place], [place] [address]  Digoxin level     Status: Abnormal   Collection Time: 07/02/23  9:46 AM  Result Value Ref Range   Digoxin Level 2.8 (HH) 0.8 - 2.0 ng/mL    Comment: CRITICAL RESULT CALLED TO, READ BACK BY AND VERIFIED WITH J. Bullins RN 07/02/23 @1152  Dabdee,T. Performed at [facility], [address], [place], [place] [address]   Comprehensive metabolic panel     Status: Abnormal   Collection Time: 07/02/23  9:46 AM  Result Value Ref Range   Sodium 126 (L) 135 - 145 mmol/L   Potassium 5.0 3.5 -  5.1 mmol/L   Chloride 96 (L) 98 - 111 mmol/L   CO2 21 (L) 22 - 32 mmol/L   Glucose, Bld 116 (H) 70 - 99 mg/dL    Comment: Glucose reference range applies only to samples taken after fasting for at least 8 hours.   BUN 54 (H) 8 - 23 mg/dL   Creatinine, Ser 1.61 (H) 0.61 - 1.24 mg/dL   Calcium 9.4 8.9 - 09.6 mg/dL   Total Protein 6.7 6.5 - 8.1 g/dL   Albumin 2.5 (L) 3.5 - 5.0 g/dL   AST 45 (H) 15 - 41 U/L   ALT 70 (H) 0 - 44 U/L   Alkaline Phosphatase 107 38 - 126 U/L   Total Bilirubin 1.2 (H) <1.2 mg/dL   GFR,  Estimated 33 (L) >60 mL/min    Comment: (NOTE) Calculated using the CKD-EPI Creatinine Equation (2021)    Anion gap 9 5 - 15    Comment: Performed at [facility], [address] [place], [place] [address]  CBC     Status: Abnormal   Collection Time: 07/02/23  9:46 AM  Result Value Ref Range   WBC 13.0 (H) 4.0 - 10.5 K/uL   RBC 4.42 4.22 - 5.81 MIL/uL   Hemoglobin 13.4 13.0 - 17.0 g/dL   HCT 98.1 19.1 - 47.8 %   MCV 92.1 80.0 - 100.0 fL   MCH 30.3 26.0 - 34.0 pg   MCHC 32.9 30.0 - 36.0 g/dL   RDW 29.5 62.1 - 30.8 %   Platelets 322 150 - 400 K/uL   nRBC 0.0 0.0 - 0.2 %    Comment: Performed at [facility], [address] [place], [place] [address]  CK     Status: Abnormal   Collection Time: 07/02/23  9:46 AM  Result Value Ref Range   Total CK 28 (L) 49 - 397 U/L    Comment: Performed at [facility], [address], [place], [place] [address]  Glucose, capillary     Status: None   Collection Time: 07/07/23  6:24 AM  Result Value Ref Range   Glucose-Capillary 95 70 - 99 mg/dL    Comment: Glucose reference range applies only to samples taken after fasting for at least 8 hours.   Comment 1 Notify RN    Comment 2 Document in Chart   POCT I-Stat EG7     Status: Abnormal   Collection Time: 07/07/23  8:17 AM  Result Value Ref Range   pH, Ven 7.420 7.25 - 7.43   pCO2, Ven 36.5 (L) 44 - 60 mmHg   pO2, Ven 34 32 - 45 mmHg   Bicarbonate 23.7 20.0 - 28.0 mmol/L   TCO2 25 22 - 32 mmol/L   O2 Saturation 67 %   Acid-base deficit 1.0 0.0 - 2.0 mmol/L   Sodium 133 (L) 135 - 145 mmol/L   Potassium 4.5 3.5 - 5.1 mmol/L   Calcium, Ion 1.29 1.15 - 1.40 mmol/L   HCT 34.0 (L) 39.0 - 52.0 %   Hemoglobin 11.6 (L) 13.0 - 17.0 g/dL   Sample type VENOUS    Comment NOTIFIED PHYSICIAN   POCT I-Stat EG7     Status: Abnormal   Collection Time: 07/07/23  8:17 AM  Result Value Ref Range   pH, Ven 7.427 7.25 - 7.43   pCO2, Ven 36.0 (L) 44 - 60 mmHg   pO2, Ven 35 32 - 45 mmHg    Bicarbonate 23.8 20.0 -  28.0 mmol/L   TCO2 25 22 - 32 mmol/L   O2 Saturation 69 %   Acid-Base Excess 0.0 0.0 - 2.0 mmol/L   Sodium 133 (L) 135 - 145 mmol/L   Potassium 4.5 3.5 - 5.1 mmol/L   Calcium, Ion 1.29 1.15 - 1.40 mmol/L   HCT 33.0 (L) 39.0 - 52.0 %   Hemoglobin 11.2 (L) 13.0 - 17.0 g/dL   Sample type VENOUS    Comment NOTIFIED PHYSICIAN   CBC with Differential/Platelet     Status: Abnormal   Collection Time: 07/07/23  9:04 AM  Result Value Ref Range   WBC 10.9 (H) 4.0 - 10.5 K/uL   RBC 3.92 (L) 4.22 - 5.81 MIL/uL   Hemoglobin 12.1 (L) 13.0 - 17.0 g/dL   HCT 40.9 (L) 81.1 - 91.4 %   MCV 93.6 80.0 - 100.0 fL   MCH 30.9 26.0 - 34.0 pg   MCHC 33.0 30.0 - 36.0 g/dL   RDW 78.2 95.6 - 21.3 %   Platelets 225 150 - 400 K/uL   nRBC 0.0 0.0 - 0.2 %   Neutrophils Relative % 75 %   Neutro Abs 8.3 (H) 1.7 - 7.7 K/uL   Lymphocytes Relative 9 %   Lymphs Abs 0.9 0.7 - 4.0 K/uL   Monocytes Relative 9 %   Monocytes Absolute 1.0 0.1 - 1.0 K/uL   Eosinophils Relative 4 %   Eosinophils Absolute 0.5 0.0 - 0.5 K/uL   Basophils Relative 1 %   Basophils Absolute 0.1 0.0 - 0.1 K/uL   Immature Granulocytes 2 %   Abs Immature Granulocytes 0.18 (H) 0.00 - 0.07 K/uL    Comment: Performed at [facility], [address], [place], [place] [address]  Basic metabolic panel     Status: Abnormal   Collection Time: 07/07/23  9:04 AM  Result Value Ref Range   Sodium 131 (L) 135 - 145 mmol/L   Potassium 4.5 3.5 - 5.1 mmol/L   Chloride 102 98 - 111 mmol/L   CO2 24 22 - 32 mmol/L   Glucose, Bld 93 70 - 99 mg/dL    Comment: Glucose reference range applies only to samples taken after fasting for at least 8 hours.   BUN 35 (H) 8 - 23 mg/dL   Creatinine, Ser 8.46 (H) 0.61 - 1.24 mg/dL   Calcium 9.0 8.9 - 96.2 mg/dL   GFR, Estimated 52 (L) >60 mL/min    Comment: (NOTE) Calculated using the CKD-EPI Creatinine Equation (2021)    Anion gap 5 5 - 15    Comment: Performed at [facility],  [address] [place], [place] [address]  Procalcitonin     Status: None   Collection Time: 07/07/23  9:04 AM  Result Value Ref Range   Procalcitonin 0.21 ng/mL    Comment:        Interpretation: PCT (Procalcitonin) <= 0.5 ng/mL: Systemic infection (sepsis) is not likely. Local bacterial infection is possible. (NOTE)       Sepsis PCT Algorithm           Lower Respiratory Tract                                      Infection PCT Algorithm    ----------------------------     ----------------------------         PCT < 0.25 ng/mL  PCT < 0.10 ng/mL          Strongly encourage             Strongly discourage   discontinuation of antibiotics    initiation of antibiotics    ----------------------------     -----------------------------       PCT 0.25 - 0.50 ng/mL            PCT 0.10 - 0.25 ng/mL               OR       >80% decrease in PCT            Discourage initiation of                                            antibiotics      Encourage discontinuation           of antibiotics    ----------------------------     -----------------------------         PCT >= 0.50 ng/mL              PCT 0.26 - 0.50 ng/mL               AND        <80% decrease in PCT             Encourage initiation of                                             antibiotics       Encourage continuation           of antibiotics    ----------------------------     -----------------------------        PCT >= 0.50 ng/mL                  PCT > 0.50 ng/mL               AND         increase in PCT                  Strongly encourage                                      initiation of antibiotics    Strongly encourage escalation           of antibiotics                                     -----------------------------                                           PCT <= 0.25 ng/mL                                                 OR                                        >  80% decrease in PCT                                       Discontinue / Do not initiate                                             antibiotics  Performed at [facility], [address], [place], [place] [address]   CK     Status: Abnormal   Collection Time: 07/07/23  9:04 AM  Result Value Ref Range   Total CK 14 (L) 49 - 397 U/L    Comment: Performed at [facility], [address], [place], [place] [address]  Magnesium     Status: None   Collection Time: 07/07/23  9:04 AM  Result Value Ref Range   Magnesium 2.0 1.7 - 2.4 mg/dL    Comment: Performed at [facility], [address] [place], [place] [address]  Brain natriuretic peptide     Status: Abnormal   Collection Time: 07/07/23  9:56 AM  Result Value Ref Range   B Natriuretic Peptide 378.2 (H) 0.0 - 100.0 pg/mL    Comment: Performed at [facility], [address] [place], [place] [address]    Assessment/Plan:  Pain in limb Negative DVT study.  ABIs done today. His ABIs are falsely elevated at >1.3 for medial calcification, but he has triphasic waveforms and normal digital pressures and waveforms consistent with no significant arterial insufficiency.   It does not appear as if his lower extremity symptoms are primarily of a vascular origin.  If this were unilateral, the description is similar to a ruptured Baker's cyst.  That would be uncommon to happen simultaneously bilaterally.  He may just have neurogenic pain from a lumbosacral source.  No further vascular workup is planned at this time.  CKD (chronic kidney disease) stage 3, GFR 30-59 ml/min (HCC) Gently hydrate and limit contrast with any angiographic procedures.  Benign essential hypertension blood pressure control important in reducing the progression of atherosclerotic disease and aneurysmal growth. On appropriate oral medications.   Cardiomyopathy, nonischemic (HCC) Poor cardiac perfusion can worsen lower extremity perfusion.      Festus Barren 07/21/2023, 2:00 PM   This note  was created with Dragon medical transcription system.  Any errors from dictation are unintentional.

## 2023-07-21 NOTE — Assessment & Plan Note (Signed)
Gently hydrate and limit contrast with any angiographic procedures.

## 2023-07-21 NOTE — Assessment & Plan Note (Signed)
blood pressure control important in reducing the progression of atherosclerotic disease and aneurysmal growth. On appropriate oral medications.  

## 2023-07-21 NOTE — Assessment & Plan Note (Addendum)
Negative DVT study.  ABIs done today. His ABIs are falsely elevated at >1.3 for medial calcification, but he has triphasic waveforms and normal digital pressures and waveforms consistent with no significant arterial insufficiency.   It does not appear as if his lower extremity symptoms are primarily of a vascular origin.  If this were unilateral, the description is similar to a ruptured Baker's cyst.  That would be uncommon to happen simultaneously bilaterally.  He may just have neurogenic pain from a lumbosacral source.  No further vascular workup is planned at this time.

## 2023-07-21 NOTE — Assessment & Plan Note (Signed)
Poor cardiac perfusion can worsen lower extremity perfusion.

## 2023-07-24 ENCOUNTER — Other Ambulatory Visit (HOSPITAL_COMMUNITY): Payer: Self-pay | Admitting: Cardiology

## 2023-07-28 LAB — VAS US ABI WITH/WO TBI
Left ABI: >1
Right ABI: >1

## 2023-08-05 NOTE — Progress Notes (Signed)
HPI: FU nonischemic cardiomyopathy. Cardiac cath at [facility] in 2004 showed normal coronaries and EF 24; endocardial biopsy unrevealing. Note, his cardiomyopathy is felt possibly secondary to ventricular ectopy in the past and he is on amiodarone. Previous holter monitor in Dec 2012 showed frequent PVCs and nonsustained ventricular tachycardia. He also has had a previous ICD (generator removed in February of 2014 as his device had reached ERI and his LV function had improved). Abd ultrasound 1/15 showed no aneurysm. Nuclear study October 2022 showed ejection fraction 39%, prior inferior infarct but no ischemia.  Chest CT March 2024 showed 8 mm right lower lobe nodule and follow-up recommended 12 months, 4.1 cm ascending thoracic aortic aneurysm. Diagnosed with new onset atrial fibrillation July 2024.  He was started on anticoagulation and underwent elective cardioversion April 07, 2023 but did not hold sinus.  Abdominal CT October 2024 showed hyperdense lesion in the left kidney likely representing a cyst but ultrasound recommended.  Patient admitted September 2024 with decompensated congestive heart failure and acute renal insufficiency requiring inotropes.  He was being assessed for LVAD placement.  Echocardiogram September 2024 showed ejection fraction less than 20%, severe left ventricular enlargement, mild left ventricular hypertrophy, mild right ventricular enlargement, mild RV dysfunction, mild left atrial enlargement, mild mitral regurgitation, mild aortic insufficiency.  Had TEE guided cardioversion of atrial flutter October 2024.  Right heart catheterization November 2024 showed PA pressure 32/13, pulmonary capillary wedge pressure 3 and mildly elevated PVR.  ABIs December 2024 showed normal toe brachial index on the right and left.  Since last seen, he feels well today.  He denies dyspnea, chest pain, palpitations, syncope or pedal edema.  Current Outpatient Medications  Medication Sig Dispense  Refill   amiodarone (PACERONE) 200 MG tablet Take 2 tablets (400 mg total) by mouth daily. (Patient taking differently: Take 200 mg by mouth 2 (two) times daily.) 90 tablet 3   apixaban (ELIQUIS) 5 MG TABS tablet Take 1 tablet (5 mg total) by mouth 2 (two) times daily. 60 tablet 2   atorvastatin (LIPITOR) 10 MG tablet Take 1 tablet (10 mg total) by mouth every other day. (Patient taking differently: Take 10 mg by mouth every other day. At night) 90 tablet 1   atorvastatin (LIPITOR) 20 MG tablet Take 1 tablet by mouth daily.     cetirizine (ZYRTEC) 10 MG tablet Take 10 mg by mouth daily as needed for allergies.     empagliflozin (JARDIANCE) 10 MG TABS tablet Take 1 tablet (10 mg total) by mouth daily. 30 tablet 2   EPINEPHrine 0.3 mg/0.3 mL IJ SOAJ injection Inject 0.3 mg into the muscle as needed for anaphylaxis.     famotidine (PEPCID) 20 MG tablet Take 20 mg by mouth daily.     feeding supplement (ENSURE ENLIVE / ENSURE PLUS) LIQD Take 237 mLs by mouth 2 (two) times daily between meals.     furosemide (LASIX) 20 MG tablet Take 1 tablet (20 mg total) by mouth daily as needed. 30 tablet 1   losartan (COZAAR) 25 MG tablet Take 0.5 tablets (12.5 mg total) by mouth at bedtime. 30 tablet 2   mirtazapine (REMERON) 15 MG tablet Take 1 tablet (15 mg total) by mouth at bedtime. 30 tablet 3   No current facility-administered medications for this visit.     Past Medical History:  Diagnosis Date   Abnormal chest CT    Anaphylactic reaction    Anxiety    Asthma    Benign  hypertension    Cardiomyopathy    Cardiomyopathy, idiopathic (HCC)    CHF (congestive heart failure) (HCC)    Chronic renal insufficiency    With creatinine 1.4   Complication of anesthesia    Drug-induced urticaria    FH: thoracic aortic aneurysm    GERD (gastroesophageal reflux disease)    History of colon polyps    Hives    Long Hx of hives   Hyperlipidemia    Hyperlipidemia    IMPLANTATION OF DEFIBRILLATOR, HX OF  02/15/2009   Qualifier: Diagnosis of  By: Jens Som, MD, Lyn Hollingshead    NSVT (nonsustained ventricular tachycardia) (HCC)    Osteoarthritis    Osteoarthrosis, unspecified whether generalized or localized, lower leg    Osteoporosis    Oxygen deficiency    Psoriasis    PVC (premature ventricular contraction)    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 CATH LAB;  Service: Cardiovascular;  Laterality: N/A;   CARDIAC CATHETERIZATION     CARDIAC DEFIBRILLATOR REMOVAL  2014   CARDIOVERSION N/A 04/07/2023   Procedure: CARDIOVERSION;  Surgeon: Chilton Si, MD;  Location: [facility] INVASIVE CV LAB;  Service: Cardiovascular;  Laterality: N/A;   CATARACT EXTRACTION     CATARACT EXTRACTION W/PHACO Left 03/12/2016   Procedure: CATARACT EXTRACTION PHACO AND INTRAOCULAR LENS PLACEMENT (IOC);  Surgeon: Sallee Lange, MD;  Location: [facility] ORS;  Service: Ophthalmology;  Laterality: Left;  [place] 01:32 AP% 25.7 CDE 41.33 fluid pack lot # [id number]   COLONOSCOPY WITH PROPOFOL N/A 04/26/2019   Procedure: COLONOSCOPY WITH PROPOFOL;  Surgeon: Christena Deem, MD;  Location: [facility] ENDOSCOPY;  Service: Endoscopy;  Laterality: N/A;   ESOPHAGOGASTRODUODENOSCOPY     EYE SURGERY     Implantation of dual-chamber ICD.     [employer] DDD-ICD 1/07   JOINT REPLACEMENT Right 08/16/2009   KNEE ARTHROPLASTY Left 2006   KNEE ARTHROPLASTY Right 12/10   x2   LAPAROSCOPIC APPENDECTOMY N/A 08/12/2018   Procedure: APPENDECTOMY LAPAROSCOPIC;  Surgeon: Henrene Dodge, MD;  Location: [facility] ORS;  Service: General;  Laterality: N/A;   RIGHT HEART CATH N/A 07/07/2023   Procedure: RIGHT HEART CATH;  Surgeon: [name], DO;  Location: [facility] INVASIVE CV LAB;  Service: Cardiovascular;  Laterality: N/A;   TONSILLECTOMY       Social History   Socioeconomic History   Marital status: Married    Spouse name: Not on file   Number of children: Not on file   Years of education: Not on file   Highest education level: Not on file  Occupational History   Not on file  Tobacco Use   Smoking status: Former    Current packs/day: 0.00    Average packs/day: 1 pack/day for 10.0 years (10.0 ttl pk-yrs)    Types: Cigarettes    Start date: 12/30/1960    Quit date: 12/31/1970    Years since quitting: 52.6    Passive exposure: Past   Smokeless tobacco: Never  Vaping Use   Vaping status: Never Used  Substance and Sexual Activity   Alcohol use: Yes    Alcohol/week: 0.0 standard drinks of alcohol    Comment: 2 DRINKS PER DAY, none this week (7/26)   Drug use: Never   Sexual activity: Not Currently  Other Topics Concern   Not on file  Social History Narrative   Not on file   Social Drivers of Health   Financial Resource Strain: Low Risk  (07/13/2023)   Received from [facility]   Overall Financial Resource Strain (CARDIA)    Difficulty of Paying Living Expenses: Not hard at all  Food Insecurity: No Food Insecurity (07/13/2023)   Received from [facility]   Hunger Vital Sign    Worried About Running Out of Food in the Last Year: Never true    Ran Out of Food in the Last Year: Never true  Transportation Needs: No Transportation Needs (07/13/2023)   Received from [facility] - Transportation    In the past 12 months, has lack of transportation kept you from medical appointments or from getting medications?: No    Lack of Transportation (Non-Medical): No  Physical Activity: Unknown (08/12/2018)   Exercise Vital Sign    Days of Exercise per Week: Patient declined    Minutes of Exercise per Session: Patient declined  Stress: No Stress Concern Present (08/12/2018)   [employer] of Occupational Health - Occupational Stress Questionnaire     Feeling of Stress : Not at all  Social Connections: Unknown (08/12/2018)   Social Connection and Isolation Panel [NHANES]    Frequency of Communication with Friends and Family: Patient declined    Frequency of Social Gatherings with Friends and Family: Patient declined    Attends Religious Services: Patient declined    Database administrator or Organizations: Patient declined    Attends Banker Meetings: Patient declined    Marital Status: Patient declined  Intimate Partner Violence: Not At Risk (05/16/2023)   Humiliation, Afraid, Rape, and Kick questionnaire    Fear of Current or Ex-Partner: No    Emotionally Abused: No    Physically Abused: No    Sexually Abused: No    Family History  Problem Relation Age of Onset   Cancer Mother    Lung cancer Mother    Heart attack Father     ROS: no fevers or chills, productive cough, hemoptysis, dysphasia, odynophagia, melena, hematochezia, dysuria, hematuria, rash, seizure activity, orthopnea, PND, pedal edema, claudication. Remaining systems are negative.  Physical Exam: Well-developed well-nourished in no acute distress.  Skin is warm and dry.  HEENT is normal.  Neck is supple.  Chest is clear to auscultation with normal expansion.  Cardiovascular exam is regular rate and rhythm.  Abdominal exam nontender or distended. No masses palpated. Extremities show no edema. neuro grossly intact  EKG Interpretation Date/Time:  Thursday August 13 2023 10:17:25 EST Ventricular Rate:  76 PR Interval:  258 QRS Duration:  118 QT Interval:  480 QTC Calculation: 540 R Axis:   -16  Text Interpretation: Sinus rhythm with 1st degree A-V block Non-specific intra-ventricular conduction delay Nonspecific ST and T wave abnormality Prolonged QT When compared with ECG of 01-Jun-2023 12:14, Non-specific intra-ventricular conduction delay has replaced Incomplete left bundle branch block Nonspecific T wave abnormality, worse in Lateral leads  QT has lengthened Confirmed by Olga Millers ([id number]) on 08/13/2023 10:18:09 AM     A/P  1 chronic systolic congestive heart failure/nonischemic cardiomyopathy-patient with significant deterioration in August requiring inotropes and also required evaluation with advanced heart failure clinic. Will continue losartan 12.5 mg daily.  I considered Entresto today but apparently his blood pressure was not tolerating losartan 25 mg daily and the dose was reduced to  12.5 mg daily.  He is not on a beta-blocker in the setting of acute CHF.  Will consider low-dose Coreg in the future.  Continue Jardiance.  He states advanced CHF discontinued his spironolactone and made his Lasix as needed.  He is being considered for left ventricular assist device but is hesitant as he feels much improved.  I think his atrial arrhythmias contributed significantly to his recent deterioration.  Hopefully he will maintain sinus rhythm on higher dose amiodarone.  2 atrial fibrillation/flutter-patient is status post TEE guided cardioversion.  He remains in sinus rhythm.  Continue amiodarone and apixaban.  Patient seen by Dr. Lalla Brothers and felt not to be a candidate for atrial fibrillation ablation.  3 chronic stage III kidney disease-will continue to follow renal function.  4 pain in lower extremities-venous Doppler showed no DVT and ABIs did not suggest significant arterial insufficiency (he was evaluated by vascular surgery).  No further workup recommended.  5 hyperlipidemia-continue statin.  6 PVCs-felt to be contributing to cardiomyopathy in the past though not clear.  Continue amiodarone.  7 thoracic aortic aneurysm-plan follow-up CTA March 2025.  8 history of lung nodule-plan follow-up CT March 2025.  9 hyperdense lesion in the left kidney-we will arrange ultrasound to further assess.  Olga Millers, MD

## 2023-08-13 ENCOUNTER — Ambulatory Visit: Payer: Medicare Other | Attending: Cardiology | Admitting: Cardiology

## 2023-08-13 ENCOUNTER — Encounter: Payer: Self-pay | Admitting: Cardiology

## 2023-08-13 VITALS — BP 126/60 | HR 81 | Ht 68.0 in | Wt 157.0 lb

## 2023-08-13 DIAGNOSIS — I10 Essential (primary) hypertension: Secondary | ICD-10-CM

## 2023-08-13 DIAGNOSIS — I700 Atherosclerosis of aorta: Secondary | ICD-10-CM | POA: Diagnosis not present

## 2023-08-13 DIAGNOSIS — I493 Ventricular premature depolarization: Secondary | ICD-10-CM | POA: Diagnosis not present

## 2023-08-13 DIAGNOSIS — N289 Disorder of kidney and ureter, unspecified: Secondary | ICD-10-CM

## 2023-08-13 DIAGNOSIS — I428 Other cardiomyopathies: Secondary | ICD-10-CM

## 2023-08-13 MED ORDER — LOSARTAN POTASSIUM 25 MG PO TABS: 12.5000 mg | ORAL_TABLET | Freq: Every day | ORAL | 3 refills | Status: DC

## 2023-08-13 NOTE — Patient Instructions (Signed)
    Testing/Procedures:  [facility] KIDNEY AT [facility] -[address]   Follow-Up: At [facility], you and your health needs are our priority.  As part of our continuing mission to provide you with exceptional heart care, we have created designated Provider Care Teams.  These Care Teams include your primary Cardiologist (physician) and Advanced Practice Providers (APPs -  Physician Assistants and Nurse Practitioners) who all work together to provide you with the care you need, when you need it.  We recommend signing up for the patient portal called "MyChart".  Sign up information is provided on this After Visit Summary.  MyChart is used to connect with patients for Virtual Visits (Telemedicine).  Patients are able to view lab/test results, encounter notes, upcoming appointments, etc.  Non-urgent messages can be sent to your provider as well.   To learn more about what you can do with MyChart, go to [email or web address].    Your next appointment:   3 month(s)  Provider:   Olga Millers, MD

## 2023-08-18 ENCOUNTER — Other Ambulatory Visit: Payer: Self-pay | Admitting: Cardiology

## 2023-08-20 ENCOUNTER — Ambulatory Visit
Admission: RE | Admit: 2023-08-20 | Discharge: 2023-08-20 | Disposition: A | Discharge status: Home / Self Care | Payer: Medicare Other | Source: Ambulatory Visit | Attending: Cardiology | Admitting: Cardiology

## 2023-08-20 DIAGNOSIS — N289 Disorder of kidney and ureter, unspecified: Secondary | ICD-10-CM

## 2023-08-24 ENCOUNTER — Other Ambulatory Visit: Payer: Self-pay | Admitting: *Deleted

## 2023-08-24 ENCOUNTER — Encounter: Payer: Self-pay | Admitting: *Deleted

## 2023-08-24 DIAGNOSIS — N2889 Other specified disorders of kidney and ureter: Secondary | ICD-10-CM

## 2023-08-26 ENCOUNTER — Other Ambulatory Visit: Payer: Self-pay | Admitting: *Deleted

## 2023-08-26 DIAGNOSIS — N2889 Other specified disorders of kidney and ureter: Secondary | ICD-10-CM

## 2023-08-31 ENCOUNTER — Ambulatory Visit: Payer: Medicare Other | Attending: Cardiology | Admitting: Cardiology

## 2023-08-31 ENCOUNTER — Encounter: Payer: Self-pay | Admitting: Cardiology

## 2023-08-31 VITALS — BP 133/62 | HR 76 | Resp 16 | Wt 166.2 lb

## 2023-08-31 DIAGNOSIS — N183 Chronic kidney disease, stage 3 unspecified: Secondary | ICD-10-CM | POA: Diagnosis not present

## 2023-08-31 DIAGNOSIS — I5022 Chronic systolic (congestive) heart failure: Secondary | ICD-10-CM

## 2023-08-31 DIAGNOSIS — M5134 Other intervertebral disc degeneration, thoracic region: Secondary | ICD-10-CM | POA: Diagnosis not present

## 2023-08-31 DIAGNOSIS — Z91018 Allergy to other foods: Secondary | ICD-10-CM | POA: Diagnosis not present

## 2023-08-31 MED ORDER — LOSARTAN POTASSIUM 25 MG PO TABS: 25.0000 mg | ORAL_TABLET | Freq: Every day | ORAL | 3 refills | Status: DC

## 2023-08-31 NOTE — Patient Instructions (Signed)
 Great to see you today!!!  Medication Changes:  INCREASE Losartan  to 25 mg (1 tab) Daily at bedtime  Lab Work:  Labs done today, your results will be available in MyChart, we will contact you for abnormal readings.   Testing/Procedures:  Your physician has requested that you have an echocardiogram. Echocardiography is a painless test that uses sound waves to create images of your heart. It provides your doctor with information about the size and shape of your heart and how well your heart's chambers and valves are working. This procedure takes approximately one hour. There are no restrictions for this procedure. Please do NOT wear cologne, perfume, aftershave, or lotions (deodorant is allowed). Please arrive 15 minutes prior to your appointment time.  Please note: We ask at that you not bring children with you during ultrasound (echo/ vascular) testing. Due to room size and safety concerns, children are not allowed in the ultrasound rooms during exams. Our front office staff cannot provide observation of children in our lobby area while testing is being conducted. An adult accompanying a patient to their appointment will only be allowed in the ultrasound room at the discretion of the ultrasound technician under special circumstances. We apologize for any inconvenience.   Referrals:  none  Special Instructions // Education:  Do the following things EVERYDAY: Weigh yourself in the morning before breakfast. Write it down and keep it in a log. Take your medicines as prescribed Eat low salt foods--Limit salt (sodium) to 2000 mg per day.  Stay as active as you can everyday Limit all fluids for the day to less than 2 liters   Follow-Up in: 1 month with an echocardiogram    If you have any questions or concerns before your next appointment please send us  a message through mychart or call our office at [phone] Monday-Friday 8 am-5 pm.   If you have an urgent need after hours on  the weekend please call your Primary Cardiologist or the Advanced Heart Failure Clinic in [place] at [phone].   At the [facility], you and your health needs are our priority. We have a designated team specialized in the treatment of Heart Failure. This Care Team includes your primary Heart Failure Specialized Cardiologist (physician), Advanced Practice Providers (APPs- Physician Assistants and Nurse Practitioners), and Pharmacist who all work together to provide you with the care you need, when you need it.   You may see any of the following providers on your designated Care Team at your next follow up:  Dr. Toribio Fuel Dr. Ezra Shuck Dr. Ria Commander Dr. Odis Brownie Greig Mosses, NP Caffie Shed, [place] [address] [place], [place] Beckey Coe, NP Jordan Lee, NP Ellouise Class, NP Jaun Bash, PharmD

## 2023-08-31 NOTE — Progress Notes (Signed)
 ADVANCED HEART FAILURE CLINIC NOTE  Referring Physician: Cleotilde Oneil FALCON, MD  Primary Care: Alan Oneil FALCON, MD Primary Cardiologist:  HPI: [name] is a 79 y.o. 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  [name] now has been followed by [name] for several years now.  He was diagnosed with heart failure at [facility] in 2004.  At that time he had an EF of 25% and also had an endomyocardial biopsy that was negative.  It was felt to be secondary to ventricular ectopy and he was subsequently started on amiodarone .  By 2019-2021 he had improvement in his EF to 35 to 40% however echocardiograms dating back to 2022 with reduction in EF to 25%.  Despite this he remained fairly well compensated and active.  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  Around August 2024 he began to have atrial tachyarrhythmias (atrial fibrillation versus flutter) requiring cardioversion.  He had recurrent atrial flutter/fibrillation leading to admission for decompensated heart failure in October 2024 with LFTs reaching the thousands and AKI with serum creatinine of 4.5.  He was started on inotropes, diuresed and eventually weaned off inotropes and LVAD evaluation was started.    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  Workup at that time and workup several months later in September 2020 consistent with alpha gal with IgE titer greater than 100.   History - Significant improvement since his last appointment; he is gaining weight, ambulating without difficulty, no longer has PND/orthopnea. Remarkable improvement.   Activity level/exercise tolerance:  NYHA IIB-III Orthopnea:  Sleeps on 2 pillows Paroxysmal noctural dyspnea:  no Chest pain/pressure:  no Orthostatic  lightheadedness:  no Palpitations:  no Lower extremity edema:  No Presyncope/syncope:  no Cough:  yes  Past Medical History:  Diagnosis Date   Abnormal chest CT    Anaphylactic reaction    Anxiety    Asthma    Benign hypertension    Cardiomyopathy    Cardiomyopathy, idiopathic (HCC)    CHF (congestive heart failure) (HCC)    Chronic renal insufficiency    With creatinine 1.4   Complication of anesthesia    Drug-induced urticaria    FH: thoracic aortic aneurysm    GERD (gastroesophageal reflux disease)    History of colon polyps    Hives    Long Hx of hives   Hyperlipidemia    Hyperlipidemia    IMPLANTATION OF DEFIBRILLATOR, HX OF 02/15/2009   Qualifier: Diagnosis of  By: Pietro, MD, [name]    NSVT (nonsustained ventricular tachycardia) (HCC)    Osteoarthritis    Osteoarthrosis, unspecified whether generalized or localized, lower leg    Osteoporosis    Oxygen  deficiency    Psoriasis    PVC (premature ventricular contraction)    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 (Patient taking differently: Take 200 mg by mouth 2 (two) times daily.) 90 tablet 3   apixaban  (ELIQUIS ) 5 MG TABS tablet Take 1 tablet (5 mg total) by mouth 2 (two) times daily. 60 tablet 2   atorvastatin  (LIPITOR) 10 MG tablet Take 1 tablet (10 mg total) by mouth every other day. (Patient  taking differently: Take 10 mg by mouth every other day. At night) 90 tablet 1   cetirizine (ZYRTEC) 10 MG tablet Take 10 mg by mouth daily as needed for allergies.     empagliflozin  (JARDIANCE ) 10 MG TABS tablet Take 1 tablet (10 mg total) by mouth daily. 30 tablet 2   EPINEPHrine  0.3 mg/0.3 mL IJ SOAJ injection Inject 0.3 mg into the muscle as needed for anaphylaxis.     famotidine  (PEPCID ) 20 MG tablet Take 20 mg by mouth daily.     feeding supplement  (ENSURE ENLIVE / ENSURE PLUS) LIQD Take 237 mLs by mouth 2 (two) times daily between meals.     furosemide  (LASIX ) 20 MG tablet Take 1 tablet (20 mg total) by mouth daily as needed. 30 tablet 1   mirtazapine  (REMERON ) 15 MG tablet Take 1 tablet (15 mg total) by mouth at bedtime. 30 tablet 3   losartan  (COZAAR ) 25 MG tablet Take 1 tablet (25 mg total) by mouth at bedtime. 90 tablet 3   No current facility-administered medications for this visit.    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   Pork-Derived Products Anaphylaxis   Ace Inhibitors Other (See Comments)    Other Reaction: Other reaction/ Urticaria secondary to COX-1 inhibitors Angioedema   Aspirin  Other (See Comments)    Unknown (reaction was over 40 years ago)   Bacid Other (See Comments)    Unknown reaction   Indocin [Indomethacin] Other (See Comments)    Unknown reaction.   Lisinopril Swelling and Other (See Comments)   Mobic [Meloxicam] Other (See Comments)    Unknown reaction   Naproxen Other (See Comments)    Unknown reaction   Nsaids Other (See Comments)    Unknown reaction      Social History   Socioeconomic History   Marital status: Married    Spouse name: Not on file   Number of children: Not on file   Years of education: Not on file   Highest education level: Not on file  Occupational History   Not on file  Tobacco Use   Smoking status: Former    Current packs/day: 0.00    Average packs/day: 1 pack/day for 10.0 years (10.0 ttl pk-yrs)    Types: Cigarettes    Start date: 12/30/1960    Quit date: 12/31/1970    Years since quitting: 52.7    Passive exposure: Past   Smokeless tobacco: Never  Vaping Use   Vaping status: Never Used  Substance and Sexual Activity   Alcohol use: Yes    Alcohol/week: 0.0 standard drinks of alcohol    Comment: 2 DRINKS PER DAY, none this week (7/26)   Drug use: Never   Sexual activity: Not Currently  Other Topics Concern    Not on file  Social History Narrative   Not on file   Social Drivers of Health   Financial Resource Strain: Low Risk  (07/13/2023)   Received from [facility]   Overall Financial Resource Strain (CARDIA)    Difficulty of Paying Living Expenses: Not hard at all  Food Insecurity: No Food Insecurity (07/13/2023)   Received from [facility]   Hunger Vital Sign    Worried About Running Out of Food in the Last Year: Never true    Ran Out of Food in the Last Year: Never true  Transportation Needs: No Transportation Needs (07/13/2023)   Received from [facility]  PRAPARE - Transportation    In the past 12 months, has lack of transportation kept you from medical appointments or from getting medications?: No    Lack of Transportation (Non-Medical): No  Physical Activity: Unknown (08/12/2018)   Exercise Vital Sign    Days of Exercise per Week: Patient declined    Minutes of Exercise per Session: Patient declined  Stress: No Stress Concern Present (08/12/2018)   [employer] of Occupational Health - Occupational Stress Questionnaire    Feeling of Stress : Not at all  Social Connections: Unknown (08/12/2018)   Social Connection and Isolation Panel [NHANES]    Frequency of Communication with Friends and Family: Patient declined    Frequency of Social Gatherings with Friends and Family: Patient declined    Attends Religious Services: Patient declined    Database Administrator or Organizations: Patient declined    Attends Banker Meetings: Patient declined    Marital Status: Patient declined  Intimate Partner Violence: Not At Risk (05/16/2023)   Humiliation, Afraid, Rape, and Kick questionnaire    Fear of Current or Ex-Partner: No    Emotionally Abused: No    Physically Abused: No    Sexually Abused: No      Family History  Problem Relation Age of Onset   Cancer Mother    Lung cancer Mother    Heart attack  Father     PHYSICAL EXAM: Vitals:   08/31/23 1339  BP: 133/62  Pulse: 76  Resp: 16  SpO2: 99%   GENERAL: Well nourished, well developed, and in no apparent distress at rest.  HEENT: There is no scleral icterus.  The mucous membranes are pink and moist.   CHEST: There are no chest wall deformities. There is no chest wall tenderness. Respirations are unlabored.  Lungs- CTA B/L CARDIAC:  JVP: 7 cm          Normal rate with regular rhythm. Soft systolic murmur.  Pulses are 2+ and symmetrical in upper and lower extremities. No edema.  ABDOMEN: Soft, non-tender, non-distended. There are normal bowel sounds.  EXTREMITIES: Warm and well perfused.  NEUROLOGIC: Patient is oriented x3 with no obvious focal neurologic deficits.  PSYCH: Patients affect is appropriate SKIN: Warm and dry; no lesions or wounds.     DATA REVIEW  ECG: 06/01/23: NSR as per my personal interpretation  ECHO: TEE (05/19/23): Severely reduced LVEF, mild AI.  05/18/23: LVEF <20%, mildly reduced RV function.  11/11/22: LVEF 25%-30%, normal RV function 04/26/23: LVEF 25%-30%, normal RV function 06/05/23: LVEF 35%-40%, normal RV function   ASSESSMENT & PLAN:  Heart failure with reduced ejection fraction Etiology of HF: Nonischemic dilated cardiomyopathy.  As noted above diagnosed in 2004 with negative endomyocardial biopsy at [facility].  LGE not consistent with any type of infiltrative cardiomyopathy. NYHA class / AHA Stage:IV Volume status & Diuretics: euvolemic, continue lasix  PRN only.  Vasodilators: increase losartan  to 25mg  daily Beta-Blocker:holding due to low output heart failure; continue digoxin . Repeat digoxin  level at follow up; no symptoms of dig toxicity.  FMJ:rnwupwlz spironolactone  25mg  daily Cardiometabolic:jardiance  10mg  daily Devices therapies & Valvulopathies:Primary prevention ICD; Dr. Danelle following Advanced therapies:Undergoing LVAD evaluation; current hurdles to LVAD implant are  malnutrition/fraility & alpha gal. I have reached out to allergy/immunology, [facility], [facility] & other academic institutes. We will plan on either heparin desensitization or Xolair prior to implant. Also we will pre-medicate with H1/H2 blockers + IV steroids prior to use of heparin.  07/02/23: seen by allergy/immunology; if he  were to proceed with LVAD we have a plan in place for heparin desensitization. Unfortunately, remains too frail for VAD placement. After extensive discussion and concern for low output heart failure will plan on milrinone  initiation with [facility] next week followed by inpatient admission.  1.13.24: doing remarkably well today.   2. 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  - 05/21/23: IgE total 1512, IgE pork 5.36, IgE Beef 30.50, alpha-gal >100 - discussed with multiple academic centers; see above.  - seen by Allergy/Immunology; will plan on hep desensitization  if we proceed with VAD  3. Atrial fibrillation/flutter - DCCV during admission  - Diagnosed initially in July 2024 now s/p x2 DCCV  - EP following - Continue apixaban  5mg  BID - NSR - Continue amiodarone  400mg  daily for the time being; CMP today to monitor LFTs. Will decrease to 200mg  daily at follow up.   4. Reduced DLCO - Severely reduced DLCO by PFTs on 05/20/23; based on chart review he was fairly euvolemic on the day of his PFTs.  - CT chest from 05/19/23 without severe parenchymal lung disease - Continue to follow; doing very well symptomatically.   5. CKD  - Baseline sCr 1.6 - Repeat labs today.   5. Malnutrition/cardiac cachexia - Resolved; doing very well.   6. Lower extremity cramping - Normal ABIs; followed by vascular surgery.  - Likely 2/2 spinal radiculopathy.   7. Failure to thrive/anxiety - continue remeron  15mg  qHS  I spent 35 minutes caring for this patient today including face to face time, ordering and reviewing  labs, reviewing records from [name] (08/13/23), vascular surgery (07/21/23), seeing the patient, documenting in the record, and arranging follow ups.   Anglia Blakley Advanced Heart Failure Mechanical Circulatory Support

## 2023-09-01 ENCOUNTER — Encounter: Payer: Self-pay | Admitting: Cardiology

## 2023-09-01 LAB — COMPREHENSIVE METABOLIC PANEL
ALT: 32 [IU]/L (ref 0–44)
AST: 35 [IU]/L (ref 0–40)
Albumin: 3.4 g/dL — ABNORMAL LOW (ref 3.8–4.8)
Alkaline Phosphatase: 77 [IU]/L (ref 44–121)
BUN/Creatinine Ratio: 15 (ref 10–24)
BUN: 21 mg/dL (ref 8–27)
Bilirubin Total: 0.4 mg/dL (ref 0.0–1.2)
CO2: 25 mmol/L (ref 20–29)
Calcium: 8.7 mg/dL (ref 8.6–10.2)
Chloride: 105 mmol/L (ref 96–106)
Creatinine, Ser: 1.37 mg/dL — ABNORMAL HIGH (ref 0.76–1.27)
Globulin, Total: 2.6 g/dL (ref 1.5–4.5)
Glucose: 113 mg/dL — ABNORMAL HIGH (ref 70–99)
Potassium: 4.2 mmol/L (ref 3.5–5.2)
Sodium: 141 mmol/L (ref 134–144)
Total Protein: 6 g/dL (ref 6.0–8.5)
eGFR: 54 mL/min/{1.73_m2} — ABNORMAL LOW (ref >59)

## 2023-09-01 LAB — BRAIN NATRIURETIC PEPTIDE: BNP: 521.7 pg/mL — ABNORMAL HIGH (ref 0.0–100.0)

## 2023-09-02 ENCOUNTER — Inpatient Hospital Stay: Admission: RE | Admit: 2023-09-02 | Payer: Medicare Other | Source: Ambulatory Visit

## 2023-09-09 ENCOUNTER — Ambulatory Visit
Admission: RE | Admit: 2023-09-09 | Discharge: 2023-09-09 | Disposition: A | Discharge status: Home / Self Care | Payer: Medicare Other | Source: Ambulatory Visit | Attending: Cardiology | Admitting: Cardiology

## 2023-09-09 DIAGNOSIS — N2889 Other specified disorders of kidney and ureter: Secondary | ICD-10-CM

## 2023-09-09 MED ORDER — IOPAMIDOL (ISOVUE-370) INJECTION 76%
80.0000 mL | Freq: Once | INTRAVENOUS | Status: AC | PRN
  Administered 2023-09-09: 80 mL via INTRAVENOUS

## 2023-09-17 ENCOUNTER — Encounter (HOSPITAL_BASED_OUTPATIENT_CLINIC_OR_DEPARTMENT_OTHER): Payer: Self-pay | Admitting: *Deleted

## 2023-09-21 ENCOUNTER — Other Ambulatory Visit: Payer: Self-pay | Admitting: *Deleted

## 2023-09-21 DIAGNOSIS — R918 Other nonspecific abnormal finding of lung field: Secondary | ICD-10-CM

## 2023-10-04 ENCOUNTER — Encounter: Payer: Self-pay | Admitting: Cardiology

## 2023-10-09 ENCOUNTER — Telehealth: Payer: Self-pay | Admitting: Cardiology

## 2023-10-09 NOTE — Telephone Encounter (Signed)
 Pt confirmed appt for 10/12/23

## 2023-10-12 ENCOUNTER — Ambulatory Visit
Admission: RE | Admit: 2023-10-12 | Discharge: 2023-10-12 | Disposition: A | Discharge status: Home / Self Care | Payer: Medicare Other | Source: Ambulatory Visit | Attending: Cardiology

## 2023-10-12 ENCOUNTER — Other Ambulatory Visit
Admission: RE | Admit: 2023-10-12 | Discharge: 2023-10-12 | Disposition: A | Discharge status: Home / Self Care | Payer: Medicare Other | Source: Ambulatory Visit | Attending: Cardiology | Admitting: Cardiology

## 2023-10-12 ENCOUNTER — Ambulatory Visit (HOSPITAL_BASED_OUTPATIENT_CLINIC_OR_DEPARTMENT_OTHER): Payer: Medicare Other | Admitting: Cardiology

## 2023-10-12 VITALS — HR 66 | Wt 171.0 lb

## 2023-10-12 DIAGNOSIS — Z7984 Long term (current) use of oral hypoglycemic drugs: Secondary | ICD-10-CM | POA: Insufficient documentation

## 2023-10-12 DIAGNOSIS — I712 Thoracic aortic aneurysm, without rupture, unspecified: Secondary | ICD-10-CM | POA: Diagnosis not present

## 2023-10-12 DIAGNOSIS — I5022 Chronic systolic (congestive) heart failure: Secondary | ICD-10-CM | POA: Diagnosis not present

## 2023-10-12 DIAGNOSIS — F419 Anxiety disorder, unspecified: Secondary | ICD-10-CM | POA: Insufficient documentation

## 2023-10-12 DIAGNOSIS — I4892 Unspecified atrial flutter: Secondary | ICD-10-CM | POA: Insufficient documentation

## 2023-10-12 DIAGNOSIS — Z5181 Encounter for therapeutic drug level monitoring: Secondary | ICD-10-CM | POA: Diagnosis not present

## 2023-10-12 DIAGNOSIS — I480 Paroxysmal atrial fibrillation: Secondary | ICD-10-CM | POA: Diagnosis not present

## 2023-10-12 DIAGNOSIS — E88A Wasting disease (syndrome) due to underlying condition: Secondary | ICD-10-CM | POA: Insufficient documentation

## 2023-10-12 DIAGNOSIS — I130 Hypertensive heart and chronic kidney disease with heart failure and stage 1 through stage 4 chronic kidney disease, or unspecified chronic kidney disease: Secondary | ICD-10-CM | POA: Diagnosis not present

## 2023-10-12 DIAGNOSIS — Z7901 Long term (current) use of anticoagulants: Secondary | ICD-10-CM | POA: Insufficient documentation

## 2023-10-12 DIAGNOSIS — Z79899 Other long term (current) drug therapy: Secondary | ICD-10-CM | POA: Insufficient documentation

## 2023-10-12 DIAGNOSIS — Z91018 Allergy to other foods: Secondary | ICD-10-CM

## 2023-10-12 DIAGNOSIS — R627 Adult failure to thrive: Secondary | ICD-10-CM | POA: Diagnosis not present

## 2023-10-12 DIAGNOSIS — E46 Unspecified protein-calorie malnutrition: Secondary | ICD-10-CM | POA: Diagnosis not present

## 2023-10-12 DIAGNOSIS — R54 Age-related physical debility: Secondary | ICD-10-CM | POA: Insufficient documentation

## 2023-10-12 DIAGNOSIS — N189 Chronic kidney disease, unspecified: Secondary | ICD-10-CM | POA: Diagnosis not present

## 2023-10-12 DIAGNOSIS — I420 Dilated cardiomyopathy: Secondary | ICD-10-CM | POA: Diagnosis not present

## 2023-10-12 DIAGNOSIS — E785 Hyperlipidemia, unspecified: Secondary | ICD-10-CM | POA: Diagnosis not present

## 2023-10-12 LAB — COMPREHENSIVE METABOLIC PANEL
ALT: 58 U/L — ABNORMAL HIGH (ref 0–44)
AST: 53 U/L — ABNORMAL HIGH (ref 15–41)
Albumin: 3.2 g/dL — ABNORMAL LOW (ref 3.5–5.0)
Alkaline Phosphatase: 49 U/L (ref 38–126)
Anion gap: 10 (ref 5–15)
BUN: 22 mg/dL (ref 8–23)
CO2: 25 mmol/L (ref 22–32)
Calcium: 8.6 mg/dL — ABNORMAL LOW (ref 8.9–10.3)
Chloride: 100 mmol/L (ref 98–111)
Creatinine, Ser: 1.45 mg/dL — ABNORMAL HIGH (ref 0.61–1.24)
GFR, Estimated: 50 mL/min — ABNORMAL LOW (ref >60)
Glucose, Bld: 88 mg/dL (ref 70–99)
Potassium: 4.1 mmol/L (ref 3.5–5.1)
Sodium: 135 mmol/L (ref 135–145)
Total Bilirubin: 0.5 mg/dL (ref 0.0–1.2)
Total Protein: 6.2 g/dL — ABNORMAL LOW (ref 6.5–8.1)

## 2023-10-12 LAB — BRAIN NATRIURETIC PEPTIDE: B Natriuretic Peptide: 847.7 pg/mL — ABNORMAL HIGH (ref 0.0–100.0)

## 2023-10-12 LAB — TSH: TSH: 6.173 u[IU]/mL — ABNORMAL HIGH (ref 0.350–4.500)

## 2023-10-12 MED ORDER — AMIODARONE HCL 200 MG PO TABS: 200.0000 mg | ORAL_TABLET | Freq: Every day | ORAL | 3 refills | Status: AC

## 2023-10-12 NOTE — Progress Notes (Signed)
 ADVANCED HEART FAILURE CLINIC NOTE  Referring Physician: [name], MD  Primary Care: [name], MD Primary Cardiologist:  HPI: JAKING THAYER is a 78 y.o. 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  [name] now has been followed by [name] for several years now.  He was diagnosed with heart failure at [facility] in 2004.  At that time he had an EF of 25% and also had an endomyocardial biopsy that was negative.  It was felt to be secondary to ventricular ectopy and he was subsequently started on amiodarone.  By 2019-2021 he had improvement in his EF to 35 to 40% however echocardiograms dating back to 2022 with reduction in EF to 25%.  Despite this he remained fairly well compensated and active.  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  Around August 2024 he began to have atrial tachyarrhythmias (atrial fibrillation versus flutter) requiring cardioversion.  He had recurrent atrial flutter/fibrillation leading to admission for decompensated heart failure in October 2024 with LFTs reaching the thousands and AKI with serum creatinine of 4.5.  He was started on inotropes, diuresed and eventually weaned off inotropes and LVAD evaluation was started.    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  Workup at that time and workup several months later in September 2020 consistent with alpha gal with IgE titer greater than 100.   History - Continues to make significant strides with functional status; nutrition; now NYHA IIB-III at worst. He has gained over 10lbs (previously cachectic).  - Complaint with all medications - Only complaints today are intermittent vertigo like symptoms that are not associated with hypotension and improve with  meclizine/dramamine.   Activity level/exercise tolerance:  NYHA IIB-III Orthopnea:  Sleeps on 2 pillows Paroxysmal noctural dyspnea:  no Chest pain/pressure:  no Orthostatic lightheadedness:  no Palpitations:  no Lower extremity edema:  No Presyncope/syncope:  no Cough:  yes Current Outpatient Medications  Medication Sig Dispense Refill   amiodarone (PACERONE) 200 MG tablet Take 2 tablets (400 mg total) by mouth daily. (Patient taking differently: Take 200 mg by mouth 2 (two) times daily.) 90 tablet 3   apixaban (ELIQUIS) 5 MG TABS tablet Take 1 tablet (5 mg total) by mouth 2 (two) times daily. 60 tablet 2   atorvastatin (LIPITOR) 10 MG tablet Take 1 tablet (10 mg total) by mouth every other day. (Patient taking differently: Take 10 mg by mouth every other day. At night) 90 tablet 1   cetirizine (ZYRTEC) 10 MG tablet Take 10 mg by mouth daily as needed for allergies.     empagliflozin (JARDIANCE) 10 MG TABS tablet Take 1 tablet (10 mg total) by mouth daily. 30 tablet 2   EPINEPHrine 0.3 mg/0.3 mL IJ SOAJ injection Inject 0.3 mg into the muscle as needed for anaphylaxis.     famotidine (PEPCID) 20 MG tablet Take 20 mg by mouth daily.     feeding supplement (ENSURE ENLIVE / ENSURE PLUS) LIQD Take 237 mLs by mouth 2 (two) times daily between meals.     furosemide (LASIX) 20 MG tablet Take 1 tablet (20 mg total) by mouth daily as needed. 30 tablet 1   losartan (COZAAR) 25 MG tablet Take 1 tablet (25 mg total) by mouth at bedtime. 90 tablet 3   mirtazapine (REMERON) 15 MG  tablet Take 1 tablet (15 mg total) by mouth at bedtime. (Patient not taking: Reported on 10/12/2023) 30 tablet 3   No current facility-administered medications for this visit.   PHYSICAL EXAM: Vitals:   10/12/23 1055  Pulse: 66  SpO2: 100%   GENERAL: NAD Lungs- CTA CARDIAC:  JVP: 6 cm          Normal rate with regular rhythm. No murmur.  Pulses 2+. No edema.  ABDOMEN: Soft, non-tender, non-distended.  EXTREMITIES:  Warm and well perfused.  NEUROLOGIC: No obvious FND   DATA REVIEW  ECG: 06/01/23: NSR as per my personal interpretation  ECHO: TEE (05/19/23): Severely reduced LVEF, mild AI.  05/18/23: LVEF <20%, mildly reduced RV function.  11/11/22: LVEF 25%-30%, normal RV function 04/26/23: LVEF 25%-30%, normal RV function 06/05/23: LVEF 35%-40%, normal RV function 10/12/23: LVEF 35%-40%, normal RV function.   ASSESSMENT & PLAN:  Heart failure with reduced ejection fraction Etiology of HF: Nonischemic dilated cardiomyopathy.  As noted above diagnosed in 2004 with negative endomyocardial biopsy at [facility].  LGE not consistent with any type of infiltrative cardiomyopathy. NYHA class / AHA Stage:IV Volume status & Diuretics: euvolemic, continue lasix PRN only.  Vasodilators: continue losartan 25mg  daily; will increase at follow up.  Beta-Blocker:holding due to low output heart failure; continue digoxin. Repeat digoxin level. Plan to wean off at follow up and start beta blocker.  ZOX:[id number] spironolactone 25mg  daily Cardiometabolic:jardiance 10mg  daily Devices therapies & Valvulopathies:Primary prevention ICD; Dr. Sharlot Gowda following Advanced therapies:Undergoing LVAD evaluation; current hurdles to LVAD implant are malnutrition/fraility & alpha gal. I have reached out to allergy/immunology, [facility], [facility] & other academic institutes. We will plan on either heparin desensitization or Xolair prior to implant. Also we will pre-medicate with H1/H2 blockers + IV steroids prior to use of heparin.  07/02/23: seen by allergy/immunology; if he were to proceed with LVAD we have a plan in place for heparin desensitization. Unfortunately, remains too frail for VAD placement. After extensive discussion and concern for low output heart failure will plan on milrinone initiation with [facility] next week followed by inpatient admission.  1.13.24: doing remarkably well today.  10/12/23: Continuing to make significant strides; LVEF now 35%.    2. 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  - 05/21/23: IgE total 1512, IgE pork 5.36, IgE Beef 30.50, alpha-gal >100 - discussed with multiple academic centers; see above.  - seen by Allergy/Immunology; will plan on hep desensitization  if we proceed with VAD - At this time no plan to proceed with LVAD due to improvement in LVEF, high risk surgery in the setting of alpha gal.   3. Atrial fibrillation/flutter - DCCV during admission  - Diagnosed initially in July 2024 now s/p x2 DCCV  - EP following - Continue apixaban 5mg  BID - NSR - Reduce amiodarone to 200mg  daily; repeat CMP/TSH today.   4. Reduced DLCO - Severely reduced DLCO by PFTs on 05/20/23; based on chart review he was fairly euvolemic on the day of his PFTs.  - CT chest from 05/19/23 without severe parenchymal lung disease - Continue to follow; doing very well symptomatically.   5. CKD  - Baseline sCr 1.6 - Repeat labs today.   5. Malnutrition/cardiac cachexia - Resolved; doing very well.   6. Lower extremity cramping - Normal ABIs; followed by vascular surgery.  - Likely 2/2 spinal radiculopathy.   7. Failure to thrive/anxiety -  continue remeron 15mg  qHS    Judaea Burgoon Advanced Heart Failure Mechanical Circulatory Support

## 2023-10-12 NOTE — Patient Instructions (Addendum)
 DECREASE YOUR AMIODARONE TO 200 MG DAILY   Go over to the [facility]. Go pass the gift shop and have your blood work completed.  We will only call you if the results are abnormal or if the provider would like to make medication changes.   Our Doctors' schedules are NOT open yet for 6 weeks. We will place you on our recall list. Once they are available, we will call you to schedule your follow up appointment.

## 2023-10-12 NOTE — Progress Notes (Signed)
*  PRELIMINARY RESULTS* Echocardiogram 2D Echocardiogram has been performed.  [name] 10/12/2023, 10:20 AM

## 2023-10-13 LAB — ECHOCARDIOGRAM COMPLETE
AR max vel: 1.86 cm2
AV Area VTI: 2.07 cm2
AV Area mean vel: 1.6 cm2
AV Mean grad: 4.7 mmHg
AV Peak grad: 9 mmHg
Ao pk vel: 1.5 m/s
Area-P 1/2: 3.11 cm2
Calc EF: 38 %
P 1/2 time: 542 ms
S' Lateral: 5.7 cm
Single Plane A2C EF: 40.9 %
Single Plane A4C EF: 38.7 %

## 2023-10-30 ENCOUNTER — Ambulatory Visit
Admission: RE | Admit: 2023-10-30 | Discharge: 2023-10-30 | Disposition: A | Discharge status: Home / Self Care | Payer: Medicare Other | Source: Ambulatory Visit | Attending: Cardiology | Admitting: Cardiology

## 2023-10-30 DIAGNOSIS — R918 Other nonspecific abnormal finding of lung field: Secondary | ICD-10-CM

## 2023-10-30 NOTE — Progress Notes (Signed)
 HPI: FU nonischemic cardiomyopathy. Cardiac cath at [facility] in 2004 showed normal coronaries and EF 24; endocardial biopsy unrevealing. Note, his cardiomyopathy is felt possibly secondary to ventricular ectopy in the past and he is on amiodarone. Previous holter monitor in Dec 2012 showed frequent PVCs and nonsustained ventricular tachycardia. He also has had a previous ICD (generator removed in February of 2014 as his device had reached ERI and his LV function had improved). Abd ultrasound 1/15 showed no aneurysm. Nuclear study October 2022 showed ejection fraction 39%, prior inferior infarct but no ischemia.  Chest CT March 2024 showed 8 mm right lower lobe nodule and follow-up recommended 12 months, 4.1 cm ascending thoracic aortic aneurysm. Diagnosed with new onset atrial fibrillation July 2024.  He was started on anticoagulation and underwent elective cardioversion April 07, 2023 but did not hold sinus.  Patient admitted September 2024 with decompensated congestive heart failure and acute renal insufficiency requiring inotropes.  He was being assessed for LVAD placement.  Had TEE guided cardioversion of atrial flutter October 2024.  Right heart catheterization November 2024 showed PA pressure 32/13, pulmonary capillary wedge pressure 3 and mildly elevated PVR.  ABIs December 2024 showed normal toe brachial index on the right and left.  Echocardiogram February 2025 showed ejection fraction 30 to 35%, severe left ventricular enlargement, grade 1 diastolic dysfunction, mild to moderate mitral regurgitation, mild to moderate aortic insufficiency and mildly dilated aortic root at 43 mm.  Follow-up chest CT March 2025 with results pending. Since last seen, he denies dyspnea, chest pain, palpitations or syncope.  His systolic blood pressure is ranging 130-140.  Current Outpatient Medications  Medication Sig Dispense Refill   amiodarone (PACERONE) 200 MG tablet Take 1 tablet (200 mg total) by mouth daily. 90  tablet 3   apixaban (ELIQUIS) 5 MG TABS tablet Take 1 tablet (5 mg total) by mouth 2 (two) times daily. 60 tablet 2   atorvastatin (LIPITOR) 10 MG tablet Take 1 tablet (10 mg total) by mouth every other day. (Patient taking differently: Take 10 mg by mouth every other day. At night) 90 tablet 1   azelastine (ASTELIN) 0.1 % nasal spray Place 1 spray into both nostrils daily. Use in each nostril as directed     cetirizine (ZYRTEC) 10 MG tablet Take 10 mg by mouth daily as needed for allergies.     empagliflozin (JARDIANCE) 10 MG TABS tablet Take 1 tablet (10 mg total) by mouth daily. 30 tablet 2   EPINEPHrine 0.3 mg/0.3 mL IJ SOAJ injection Inject 0.3 mg into the muscle as needed for anaphylaxis.     famotidine (PEPCID) 20 MG tablet Take 20 mg by mouth daily.     feeding supplement (ENSURE ENLIVE / ENSURE PLUS) LIQD Take 237 mLs by mouth 2 (two) times daily between meals.     furosemide (LASIX) 20 MG tablet Take 1 tablet (20 mg total) by mouth daily as needed. 30 tablet 1   losartan (COZAAR) 25 MG tablet Take 1 tablet (25 mg total) by mouth at bedtime. 90 tablet 3   mirtazapine (REMERON) 15 MG tablet Take 1 tablet (15 mg total) by mouth at bedtime. (Patient not taking: Reported on 10/12/2023) 30 tablet 3   No current facility-administered medications for this visit.     Past Medical History:  Diagnosis Date   Abnormal chest CT    Anaphylactic reaction    Anxiety    Asthma    Benign hypertension    Cardiomyopathy  Cardiomyopathy, idiopathic (HCC)    CHF (congestive heart failure) (HCC)    Chronic renal insufficiency    With creatinine 1.4   Complication of anesthesia    Drug-induced urticaria    FH: thoracic aortic aneurysm    GERD (gastroesophageal reflux disease)    History of colon polyps    Hives    Long Hx of hives   Hyperlipidemia    Hyperlipidemia    IMPLANTATION OF DEFIBRILLATOR, HX OF 02/15/2009   Qualifier: Diagnosis of  By: Jens Som, MD, Lyn Hollingshead    NSVT  (nonsustained ventricular tachycardia) (HCC)    Osteoarthritis    Osteoarthrosis, unspecified whether generalized or localized, lower leg    Osteoporosis    Oxygen deficiency    Psoriasis    PVC (premature ventricular contraction)    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    Past Surgical History:  Procedure Laterality Date   APPENDECTOMY     BIV ICD GENERTAOR CHANGE OUT N/A 10/08/2012   Procedure: BIV ICD GENERTAOR CHANGE OUT;  Surgeon: Marinus Maw, MD;  Location: [facility] CATH LAB;  Service: Cardiovascular;  Laterality: N/A;   CARDIAC CATHETERIZATION     CARDIAC DEFIBRILLATOR REMOVAL  2014   CARDIOVERSION N/A 04/07/2023   Procedure: CARDIOVERSION;  Surgeon: Chilton Si, MD;  Location: [facility] INVASIVE CV LAB;  Service: Cardiovascular;  Laterality: N/A;   CATARACT EXTRACTION     CATARACT EXTRACTION W/PHACO Left 03/12/2016   Procedure: CATARACT EXTRACTION PHACO AND INTRAOCULAR LENS PLACEMENT (IOC);  Surgeon: Sallee Lange, MD;  Location: [facility] ORS;  Service: Ophthalmology;  Laterality: Left;  [place] 01:32 AP% 25.7 CDE 41.33 fluid pack lot # [id number]   COLONOSCOPY WITH PROPOFOL N/A 04/26/2019   Procedure: COLONOSCOPY WITH PROPOFOL;  Surgeon: Christena Deem, MD;  Location: [facility] ENDOSCOPY;  Service: Endoscopy;  Laterality: N/A;   ESOPHAGOGASTRODUODENOSCOPY     EYE SURGERY     Implantation of dual-chamber ICD.     St[employer] Jude DDD-ICD 1/07   JOINT REPLACEMENT Right 08/16/2009   KNEE ARTHROPLASTY Left 2006   KNEE ARTHROPLASTY Right 12/10   x2   LAPAROSCOPIC APPENDECTOMY N/A 08/12/2018   Procedure: APPENDECTOMY LAPAROSCOPIC;  Surgeon: Henrene Dodge, MD;  Location: [facility] ORS;  Service: General;  Laterality: N/A;   RIGHT HEART CATH N/A 07/07/2023   Procedure: RIGHT HEART CATH;  Surgeon: [name], DO;  Location: [facility] INVASIVE CV LAB;  Service: Cardiovascular;  Laterality: N/A;   TONSILLECTOMY      Social History   Socioeconomic History   Marital status: Married    Spouse name:  Not on file   Number of children: Not on file   Years of education: Not on file   Highest education level: Not on file  Occupational History   Not on file  Tobacco Use   Smoking status: Former    Current packs/day: 0.00    Average packs/day: 1 pack/day for 10.0 years (10.0 ttl pk-yrs)    Types: Cigarettes    Start date: 12/30/1960    Quit date: 12/31/1970    Years since quitting: 52.8    Passive exposure: Past   Smokeless tobacco: Never  Vaping Use   Vaping status: Never Used  Substance and Sexual Activity   Alcohol use: Yes    Alcohol/week: 0.0 standard drinks of alcohol    Comment: 2 DRINKS PER DAY, none this week (7/26)   Drug use: Never   Sexual activity: Not Currently  Other Topics Concern   Not on file  Social History Narrative   Not on file   Social Drivers of Health   Financial Resource Strain: Low Risk  (07/13/2023)   Received from [facility]   Overall Financial Resource Strain (CARDIA)    Difficulty of Paying Living Expenses: Not hard at all  Food Insecurity: No Food Insecurity (07/13/2023)   Received from [facility]   Hunger Vital Sign    Worried About Running Out of Food in the Last Year: Never true    Ran Out of Food in the Last Year: Never true  Transportation Needs: No Transportation Needs (07/13/2023)   Received from [facility]    In the past 12 months, has lack of transportation kept you from medical appointments or from getting medications?: No    Lack of Transportation (Non-Medical): No  Physical Activity: Unknown (08/12/2018)   Exercise Vital Sign    Days of Exercise per Week: Patient declined    Minutes of Exercise per Session: Patient declined  Stress: No Stress Concern Present (08/12/2018)   [employer] of Occupational Health - Occupational Stress Questionnaire    Feeling of Stress : Not at all  Social Connections: Unknown (08/12/2018)   Social Connection  and Isolation Panel [NHANES]    Frequency of Communication with Friends and Family: Patient declined    Frequency of Social Gatherings with Friends and Family: Patient declined    Attends Religious Services: Patient declined    Database administrator or Organizations: Patient declined    Attends Banker Meetings: Patient declined    Marital Status: Patient declined  Intimate Partner Violence: Not At Risk (05/16/2023)   Humiliation, Afraid, Rape, and Kick questionnaire    Fear of Current or Ex-Partner: No    Emotionally Abused: No    Physically Abused: No    Sexually Abused: No    Family History  Problem Relation Age of Onset   Cancer Mother    Lung cancer Mother    Heart attack Father     ROS: no fevers or chills, productive cough, hemoptysis, dysphasia, odynophagia, melena, hematochezia, dysuria, hematuria, rash, seizure activity, orthopnea, PND, pedal edema, claudication. Remaining systems are negative.  Physical Exam: Well-developed well-nourished in no acute distress.  Skin is warm and dry.  HEENT is normal.  Neck is supple.  Chest is clear to auscultation with normal expansion.  Cardiovascular exam is regular rate and rhythm.  Abdominal exam nontender or distended. No masses palpated. Extremities show no edema. neuro grossly intact  EKG Interpretation Date/Time:  Wednesday November 11 2023 09:49:45 EDT Ventricular Rate:  67 PR Interval:  248 QRS Duration:  114 QT Interval:  448 QTC Calculation: 473 R Axis:   14  Text Interpretation: Sinus rhythm with 1st degree A-V block with occasional Premature ventricular complexes Nonspecific ST changes Confirmed by Olga Millers ([id number]) on 11/11/2023 9:52:54 AM    A/P  1 chronic systolic congestive heart failure/nonischemic cardiomyopathy-patient is euvolemic on examination today.  His blood pressure had previously been an issue but it has improved now that he is maintaining sinus rhythm.  Discontinue losartan and  begin Entresto 24/26 twice daily.  Check potassium and renal function in 1 week.  He is scheduled to follow-up with the CHF clinic and if his blood pressure allows would begin low-dose carvedilol and spironolactone at that time.  Continue Jardiance.   2 history of atrial fibrillation/flutter-patient remains in sinus rhythm.  Continue amiodarone and apixaban.  Previously seen by Dr. Lalla Brothers and felt not to be a candidate for atrial fibrillation ablation.  3 hyperlipidemia-continue statin.  4 history of PVCs-continue amiodarone.  This was felt possibly contributing to cardiomyopathy in the past.  5 thoracic aortic aneurysm-follow-up chest CT done on March 14 is pending.  6 history of lung nodule-follow-up chest CT done March 14 is pending.  7 chronic stage III kidney disease-continue to follow renal function.  Olga Millers, MD

## 2023-11-04 ENCOUNTER — Ambulatory Visit: Admission: EM | Admit: 2023-11-04 | Discharge: 2023-11-04 | Disposition: A | Discharge status: Home / Self Care

## 2023-11-04 NOTE — ED Notes (Signed)
 Patient made aware we did not have xray on site, did not want to stay

## 2023-11-11 ENCOUNTER — Encounter: Payer: Self-pay | Admitting: Cardiology

## 2023-11-11 ENCOUNTER — Ambulatory Visit: Payer: Medicare Other | Attending: Cardiology | Admitting: Cardiology

## 2023-11-11 VITALS — BP 130/72 | HR 67 | Ht 68.0 in | Wt 172.8 lb

## 2023-11-11 DIAGNOSIS — I428 Other cardiomyopathies: Secondary | ICD-10-CM

## 2023-11-11 DIAGNOSIS — I5022 Chronic systolic (congestive) heart failure: Secondary | ICD-10-CM | POA: Diagnosis not present

## 2023-11-11 DIAGNOSIS — I480 Paroxysmal atrial fibrillation: Secondary | ICD-10-CM

## 2023-11-11 DIAGNOSIS — N183 Chronic kidney disease, stage 3 unspecified: Secondary | ICD-10-CM

## 2023-11-11 MED ORDER — SACUBITRIL-VALSARTAN 24-26 MG PO TABS: 1.0000 | ORAL_TABLET | Freq: Two times a day (BID) | ORAL | 11 refills | Status: DC

## 2023-11-11 NOTE — Patient Instructions (Signed)
 Medication Instructions:   STOP LOSARTAN  START ENTRESTO 24/26 MG ONE TABLET TWICE DAILY  *If you need a refill on your cardiac medications before your next appointment, please call your pharmacy*   Lab Work:  Your physician recommends that you return for lab work in: ONE WEEK-DO NOT NEED TO FAST  If you have labs (blood work) drawn today and your tests are completely normal, you will receive your results only by: MyChart Message (if you have MyChart) OR A paper copy in the mail If you have any lab test that is abnormal or we need to change your treatment, we will call you to review the results.   Follow-Up: At [facility], you and your health needs are our priority.  As part of our continuing mission to provide you with exceptional heart care, we have created designated Provider Care Teams.  These Care Teams include your primary Cardiologist (physician) and Advanced Practice Providers (APPs -  Physician Assistants and Nurse Practitioners) who all work together to provide you with the care you need, when you need it.   Your next appointment:   6 month(s)  Provider:   Olga Millers, MD

## 2023-11-12 ENCOUNTER — Ambulatory Visit (HOSPITAL_COMMUNITY): Payer: Medicare Other | Admitting: Internal Medicine

## 2023-11-17 ENCOUNTER — Encounter: Payer: Self-pay | Admitting: Cardiology

## 2023-11-17 ENCOUNTER — Encounter: Payer: Self-pay | Admitting: *Deleted

## 2023-11-19 LAB — BASIC METABOLIC PANEL WITH GFR
BUN/Creatinine Ratio: 13 (ref 10–24)
BUN: 19 mg/dL (ref 8–27)
CO2: 24 mmol/L (ref 20–29)
Calcium: 9 mg/dL (ref 8.6–10.2)
Chloride: 106 mmol/L (ref 96–106)
Creatinine, Ser: 1.52 mg/dL — ABNORMAL HIGH (ref 0.76–1.27)
Glucose: 137 mg/dL — ABNORMAL HIGH (ref 70–99)
Potassium: 4.2 mmol/L (ref 3.5–5.2)
Sodium: 143 mmol/L (ref 134–144)
eGFR: 47 mL/min/{1.73_m2} — ABNORMAL LOW (ref >59)

## 2023-11-20 ENCOUNTER — Encounter: Payer: Self-pay | Admitting: *Deleted

## 2023-12-01 ENCOUNTER — Telehealth: Payer: Self-pay | Admitting: Cardiology

## 2023-12-01 NOTE — Telephone Encounter (Signed)
 Called to r/s appt on 12/04/23

## 2023-12-03 ENCOUNTER — Encounter: Payer: Self-pay | Admitting: Cardiology

## 2023-12-03 DIAGNOSIS — I5022 Chronic systolic (congestive) heart failure: Secondary | ICD-10-CM

## 2023-12-03 NOTE — Telephone Encounter (Signed)
 RN called and spoke to patient .  Patient states he not in distress. He states he feels the abnormal rhythm  in  his carotids when they occur. He states heart is not fast but did not give RN a actual number.  Patient's is diagnosis with  persistent atrial fibrillation. Patient is taking Amiodarone 200 mg daily . Eliquis 5 mg twice a day .   Offered a next available appointment on 12/21/23 with APP.  Patient states he has an appointment with Heart failure clinic on 12/22/23 Dr Bruce Caper in [place].   RN informed patient to keep appointment. Reassured patient he was taking the appropriate medications.   Patient asked should he increase Amiodarone back to 200 mg twice a day.  RN informed patient will have to defer to a provider to make that discussion . Patient aware will send message and call him back with a response.

## 2023-12-04 ENCOUNTER — Inpatient Hospital Stay (HOSPITAL_COMMUNITY)
Admission: RE | Admit: 2023-12-04 | Discharge: 2023-12-04 | Disposition: A | Discharge status: Home / Self Care | Source: Ambulatory Visit | Attending: Cardiology | Admitting: Cardiology

## 2023-12-04 ENCOUNTER — Other Ambulatory Visit (HOSPITAL_COMMUNITY): Payer: Self-pay | Admitting: Cardiology

## 2023-12-04 ENCOUNTER — Encounter: Admitting: Cardiology

## 2023-12-04 DIAGNOSIS — I493 Ventricular premature depolarization: Secondary | ICD-10-CM

## 2023-12-04 NOTE — Addendum Note (Signed)
 Addended by: Temiloluwa Recchia A on: 12/04/2023 03:09 PM   Modules accepted: Orders

## 2023-12-21 ENCOUNTER — Telehealth: Payer: Self-pay | Admitting: Cardiology

## 2023-12-21 NOTE — Telephone Encounter (Signed)
 Called to confirm/remind patient of their appointment at the [facility] on 12/22/23.   Appointment:   [] Confirmed  [x] Left mess   [] No answer/No voice mail  [] VM Full/unable to leave message  [] Phone not in service  Patient reminded to bring all medications and/or complete list.  Confirmed patient has transportation. Gave directions, instructed to utilize valet parking.

## 2023-12-22 ENCOUNTER — Other Ambulatory Visit
Admission: RE | Admit: 2023-12-22 | Discharge: 2023-12-22 | Disposition: A | Discharge status: Home / Self Care | Source: Ambulatory Visit | Attending: Internal Medicine | Admitting: Internal Medicine

## 2023-12-22 ENCOUNTER — Encounter: Payer: Self-pay | Admitting: Cardiology

## 2023-12-22 ENCOUNTER — Ambulatory Visit (HOSPITAL_BASED_OUTPATIENT_CLINIC_OR_DEPARTMENT_OTHER): Admitting: Cardiology

## 2023-12-22 VITALS — BP 125/60 | HR 70 | Wt 173.0 lb

## 2023-12-22 DIAGNOSIS — I5023 Acute on chronic systolic (congestive) heart failure: Secondary | ICD-10-CM | POA: Diagnosis present

## 2023-12-22 DIAGNOSIS — N189 Chronic kidney disease, unspecified: Secondary | ICD-10-CM | POA: Diagnosis not present

## 2023-12-22 DIAGNOSIS — Z79899 Other long term (current) drug therapy: Secondary | ICD-10-CM | POA: Diagnosis not present

## 2023-12-22 DIAGNOSIS — R627 Adult failure to thrive: Secondary | ICD-10-CM | POA: Insufficient documentation

## 2023-12-22 DIAGNOSIS — I5022 Chronic systolic (congestive) heart failure: Secondary | ICD-10-CM

## 2023-12-22 DIAGNOSIS — R54 Age-related physical debility: Secondary | ICD-10-CM | POA: Insufficient documentation

## 2023-12-22 DIAGNOSIS — I712 Thoracic aortic aneurysm, without rupture, unspecified: Secondary | ICD-10-CM | POA: Insufficient documentation

## 2023-12-22 DIAGNOSIS — Z7901 Long term (current) use of anticoagulants: Secondary | ICD-10-CM | POA: Insufficient documentation

## 2023-12-22 DIAGNOSIS — I420 Dilated cardiomyopathy: Secondary | ICD-10-CM | POA: Diagnosis not present

## 2023-12-22 DIAGNOSIS — I480 Paroxysmal atrial fibrillation: Secondary | ICD-10-CM | POA: Diagnosis not present

## 2023-12-22 DIAGNOSIS — I493 Ventricular premature depolarization: Secondary | ICD-10-CM

## 2023-12-22 DIAGNOSIS — I130 Hypertensive heart and chronic kidney disease with heart failure and stage 1 through stage 4 chronic kidney disease, or unspecified chronic kidney disease: Secondary | ICD-10-CM | POA: Insufficient documentation

## 2023-12-22 DIAGNOSIS — Z91018 Allergy to other foods: Secondary | ICD-10-CM

## 2023-12-22 DIAGNOSIS — F419 Anxiety disorder, unspecified: Secondary | ICD-10-CM | POA: Insufficient documentation

## 2023-12-22 DIAGNOSIS — N183 Chronic kidney disease, stage 3 unspecified: Secondary | ICD-10-CM

## 2023-12-22 LAB — BASIC METABOLIC PANEL WITH GFR
Anion gap: 9 (ref 5–15)
BUN: 22 mg/dL (ref 8–23)
CO2: 26 mmol/L (ref 22–32)
Calcium: 8.8 mg/dL — ABNORMAL LOW (ref 8.9–10.3)
Chloride: 106 mmol/L (ref 98–111)
Creatinine, Ser: 1.49 mg/dL — ABNORMAL HIGH (ref 0.61–1.24)
GFR, Estimated: 48 mL/min — ABNORMAL LOW (ref >60)
Glucose, Bld: 102 mg/dL — ABNORMAL HIGH (ref 70–99)
Potassium: 3.4 mmol/L — ABNORMAL LOW (ref 3.5–5.1)
Sodium: 141 mmol/L (ref 135–145)

## 2023-12-22 LAB — BRAIN NATRIURETIC PEPTIDE: B Natriuretic Peptide: 770.3 pg/mL — ABNORMAL HIGH (ref 0.0–100.0)

## 2023-12-22 MED ORDER — SPIRONOLACTONE 25 MG PO TABS: 12.5000 mg | ORAL_TABLET | Freq: Every day | ORAL | 3 refills | Status: DC

## 2023-12-22 MED ORDER — CARVEDILOL 3.125 MG PO TABS: 3.1250 mg | ORAL_TABLET | Freq: Two times a day (BID) | ORAL | 3 refills | Status: DC

## 2023-12-22 NOTE — Progress Notes (Signed)
 ADVANCED HEART FAILURE CLINIC NOTE  Referring Physician: Sari Cunning, MD  Primary Care: Sari Cunning, MD Primary Cardiologist:  HPI: [name] is a 78 y.o. 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  Mr. Niederer now has been followed by Dr. Audery Blazing for several years now.  He was diagnosed with heart failure at [facility] in 2004.  At that time he had an EF of 25% and also had an endomyocardial biopsy that was negative.  It was felt to be secondary to ventricular ectopy and he was subsequently started on amiodarone .  By 2019-2021 he had improvement in his EF to 35 to 40% however echocardiograms dating back to 2022 with reduction in EF to 25%.  Despite this he remained fairly well compensated and active.  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  Around August 2024 he began to have atrial tachyarrhythmias (atrial fibrillation versus flutter) requiring cardioversion.  He had recurrent atrial flutter/fibrillation leading to admission for decompensated heart failure in October 2024 with LFTs reaching the thousands and AKI with serum creatinine of 4.5.  He was started on inotropes, diuresed and eventually weaned off inotropes and LVAD evaluation was started.    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  Workup at that time and workup several months later in September 2020 consistent with alpha gal with IgE titer greater than 100.   History Today he returns for AHF follow up. Overall feeling very good. Denies palpitations, CP, dizziness, edema, or PND/Orthopnea. SOB yesterday while fertilizing his yard up a hill, resolved within 5 minutes. Appetite ok, he watches what he eats at home. No fever or chills. Weight at home 164-168 pounds. Taking all medications. Denies ETOH, tobacco or drug use.  SBP at home 130s.   Zio sent earlier last month 2/2 "irregular heart rhythm on home monitor". Zio showed HR 53-113. Avg HR 66 bpm. 2 SVT run (longest 7 beats). <1% SVE  Activity level/exercise tolerance:  NYHA IIB-III Orthopnea:  Sleeps on 2 pillows Paroxysmal noctural dyspnea:  no Chest pain/pressure:  no Orthostatic lightheadedness:  no Palpitations:  no Lower extremity edema:  No Presyncope/syncope:  no Cough:  yes Current Outpatient Medications  Medication Sig Dispense Refill   amiodarone  (PACERONE ) 200 MG tablet Take 1 tablet (200 mg total) by mouth daily. 90 tablet 3   apixaban  (ELIQUIS ) 5 MG TABS tablet Take 1 tablet (5 mg total) by mouth 2 (two) times daily. 60 tablet 2   atorvastatin  (LIPITOR) 10 MG tablet Take 1 tablet (10 mg total) by mouth every other day. (Patient taking differently: Take 10 mg by mouth every other day. At night) 90 tablet 1   carvedilol  (COREG ) 3.125 MG tablet Take 1 tablet (3.125 mg total) by mouth 2 (two) times daily. 180 tablet 3   empagliflozin  (JARDIANCE ) 10 MG TABS tablet Take 1 tablet (10 mg total) by mouth daily. 30 tablet 2   EPINEPHrine  0.3 mg/0.3 mL IJ SOAJ injection Inject 0.3 mg into the muscle as needed for anaphylaxis.     famotidine  (PEPCID ) 20 MG tablet Take 20 mg by mouth daily.     feeding supplement (ENSURE ENLIVE / ENSURE PLUS) LIQD Take 237 mLs by mouth 2 (two) times daily between meals.     furosemide  (LASIX ) 20 MG tablet Take 1 tablet (20 mg total) by mouth daily as  needed. 30 tablet 1   sacubitril -valsartan  (ENTRESTO ) 24-26 MG Take 1 tablet by mouth 2 (two) times daily. 60 tablet 11   spironolactone  (ALDACTONE ) 25 MG tablet Take 0.5 tablets (12.5 mg total) by mouth daily. 45 tablet 3   No current facility-administered medications for this visit.   PHYSICAL EXAM: Vitals:   12/22/23 1546  BP: 125/60  Pulse: 70  SpO2: 99%   General:  well appearing.  No respiratory difficulty. Walked into clinic.  Neck: supple. JVD ~7 cm.   Cor: PMI nondisplaced. Regular rate & rhythm. No rubs, gallops or murmurs. Lungs: clear Extremities: no cyanosis, clubbing, rash, edema  Neuro: alert & oriented x 3. Moves all 4 extremities w/o difficulty. Affect pleasant.   Wt Readings from Last 3 Encounters:  12/22/23 173 lb (78.5 kg)  11/11/23 172 lb 12.8 oz (78.4 kg)  10/12/23 171 lb (77.6 kg)    DATA REVIEW  ECG: NSR with 1AVB 68 bpm 12/22/23 (Personally reviewed)    ECHO: TEE (05/19/23): Severely reduced LVEF, mild AI.  05/18/23: LVEF <20%, mildly reduced RV function.  11/11/22: LVEF 25%-30%, normal RV function 04/26/23: LVEF 25%-30%, normal RV function 06/05/23: LVEF 35%-40%, normal RV function 10/12/23: LVEF 35%-40%, normal RV function.   RHC 11/24: RA 1, PA 32/13 (20), Fick CO/CI 4.6/2.6. TD CO/CI 5.7/3.2. PAPi >10.   ASSESSMENT & PLAN: Heart failure with reduced ejection fraction Etiology of HF: Nonischemic dilated cardiomyopathy.  As noted above diagnosed in 2004 with negative endomyocardial biopsy at [facility].  LGE not consistent with any type of infiltrative cardiomyopathy. NYHA class / AHA Stage: II-IIIa Volume status & Diuretics: euvolemic, continue lasix  PRN only.  Vasodilators: continue Entresto  24-26 mg BID, recently started Beta-Blocker:Now off digoxin . Start coreg  3.125 mg BID  MRA:Restart spiro 12.5mg  daily. Labs today. Repeat in ~10 days Cardiometabolic:Continue jardiance  10mg  daily Devices therapies & Valvulopathies:Primary prevention ICD; Dr. Pete Brand following Advanced therapies:Undergoing LVAD evaluation; current hurdles to LVAD implant are malnutrition/fraility & alpha gal. Have reached out to allergy/immunology, [facility], [facility] & other academic institutes. We will plan on either heparin desensitization or Xolair prior to implant. Also we will pre-medicate with H1/H2 blockers + IV steroids prior to use of heparin.  07/02/23: seen by allergy/immunology; if he were to proceed with LVAD we have a plan in place for heparin  desensitization. 1.13.24: Doing remarkably well.  10/12/23: Continuing to make significant strides; LVEF now 35%.  5/25: Doing very well. No complaints.   2. 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  - 05/21/23: IgE total 1512, IgE pork 5.36, IgE Beef 30.50, alpha-gal >100 - discussed with multiple academic centers; see above.  - seen by Allergy/Immunology; will plan on hep desensitization  if we proceed with VAD - At this time no plan to proceed with LVAD due to improvement in LVEF, high risk surgery in the setting of alpha gal.   3. Atrial fibrillation/flutter - DCCV during admission  - Diagnosed initially in July 2024 now s/p x2 DCCV  - EP following - Zio sent earlier last month 2/2 "irregular heart rhythm on home monitor". Zio showed HR 53-113. Avg HR 66 bpm. 2 SVT run (longest 7 beats). <1% SVE - Continue apixaban  5mg  BID - Continue amiodarone  to 200mg  daily. TSH and LFTs stable 3/25  4. Reduced DLCO - Severely reduced DLCO by PFTs on 05/20/23; based on chart review he was fairly euvolemic on the day  of his PFTs.  - CT chest from 05/19/23 without severe parenchymal lung disease - Continue to follow; doing very well symptomatically.   5. CKD  - Baseline sCr 1.6 - Repeat labs today.   5. Malnutrition/cardiac cachexia - Resolved; doing very well.   6. Lower extremity cramping - Normal ABIs; followed by vascular surgery.  - Likely 2/2 spinal radiculopathy.   7. Failure to thrive/anxiety - continue remeron  15mg  qHS  Follow up in 2 months with Dr. Bruce Caper.   Sheryl Donna AGACNP-BC  [facility]

## 2023-12-22 NOTE — Patient Instructions (Signed)
 Medication Changes:  START Carvedilol  3.125mg  (1 tab) two times daily  START Spironolactone  12.5mg  (1/2 tab) daily  Lab Work:  Go over to the [facility]. Go pass the gift shop and have your blood work completed.  PLEASE HAVE BLOOD WORK DONE TODAY AND AGAIN IN 1 WEEK.  We will only call you if the results are abnormal or if the provider would like to make medication changes.   Follow-Up in: Please follow up with the [facility] Clinic in 2 months with Dr. Bruce Caper.  At the [facility], you and your health needs are our priority. We have a designated team specialized in the treatment of Heart Failure. This Care Team includes your primary Heart Failure Specialized Cardiologist (physician), Advanced Practice Providers (APPs- Physician Assistants and Nurse Practitioners), and Pharmacist who all work together to provide you with the care you need, when you need it.   You may see any of the following providers on your designated Care Team at your next follow up:  Dr. Jules Oar Dr. Peder Bourdon Dr. Alwin Baars Dr. Judyth Nunnery Shawnee Dellen, FNP Bevely Brush, RPH-CPP  Please be sure to bring in all your medications bottles to every appointment.   Need to Contact Us :  If you have any questions or concerns before your next appointment please send us  a message through [place] or call our office at [phone].    TO LEAVE A MESSAGE FOR THE NURSE SELECT OPTION 2, PLEASE LEAVE A MESSAGE INCLUDING: YOUR NAME DATE OF BIRTH CALL BACK NUMBER REASON FOR CALL**this is important as we prioritize the call backs  YOU WILL RECEIVE A CALL BACK THE SAME DAY AS LONG AS YOU CALL BEFORE 4:00 PM

## 2023-12-25 ENCOUNTER — Other Ambulatory Visit: Payer: Self-pay | Admitting: Cardiology

## 2023-12-29 ENCOUNTER — Ambulatory Visit (HOSPITAL_COMMUNITY): Payer: Self-pay | Admitting: Internal Medicine

## 2023-12-29 ENCOUNTER — Other Ambulatory Visit
Admission: RE | Admit: 2023-12-29 | Discharge: 2023-12-29 | Disposition: A | Discharge status: Home / Self Care | Attending: Internal Medicine | Admitting: Internal Medicine

## 2023-12-29 DIAGNOSIS — I5022 Chronic systolic (congestive) heart failure: Secondary | ICD-10-CM | POA: Insufficient documentation

## 2023-12-29 LAB — BASIC METABOLIC PANEL WITH GFR
Anion gap: 6 (ref 5–15)
BUN: 20 mg/dL (ref 8–23)
CO2: 28 mmol/L (ref 22–32)
Calcium: 9 mg/dL (ref 8.9–10.3)
Chloride: 103 mmol/L (ref 98–111)
Creatinine, Ser: 1.9 mg/dL — ABNORMAL HIGH (ref 0.61–1.24)
GFR, Estimated: 36 mL/min — ABNORMAL LOW (ref >60)
Glucose, Bld: 107 mg/dL — ABNORMAL HIGH (ref 70–99)
Potassium: 4.4 mmol/L (ref 3.5–5.1)
Sodium: 137 mmol/L (ref 135–145)

## 2023-12-29 NOTE — Telephone Encounter (Signed)
 Spoke with patient regarding the following results. Patient made aware and patient verbalized understanding.   Patient reports that he will go to [facility] where he went today to have labs drawn in 10 days. Lab orders placed.   Advised patient to call back to office with any issues, questions, or concerns. Patient verbalized understanding.

## 2023-12-29 NOTE — Addendum Note (Signed)
 Encounter addended by: Stan Eans, RN on: 12/29/2023 12:18 PM  Actions taken: Imaging Exam ended

## 2023-12-29 NOTE — Telephone Encounter (Signed)
-----   Message from Sheryl Donna sent at 12/29/2023  1:24 PM EDT ----- Slight increase in renal function. Order BMET and BNP in ~10 days.

## 2024-01-05 ENCOUNTER — Encounter (INDEPENDENT_AMBULATORY_CARE_PROVIDER_SITE_OTHER): Payer: Self-pay

## 2024-01-08 ENCOUNTER — Other Ambulatory Visit
Admission: RE | Admit: 2024-01-08 | Discharge: 2024-01-08 | Disposition: A | Discharge status: Home / Self Care | Attending: Internal Medicine | Admitting: Internal Medicine

## 2024-01-08 DIAGNOSIS — I5022 Chronic systolic (congestive) heart failure: Secondary | ICD-10-CM | POA: Insufficient documentation

## 2024-01-08 LAB — BASIC METABOLIC PANEL WITH GFR
Anion gap: 12 (ref 5–15)
BUN: 18 mg/dL (ref 8–23)
CO2: 23 mmol/L (ref 22–32)
Calcium: 9 mg/dL (ref 8.9–10.3)
Chloride: 101 mmol/L (ref 98–111)
Creatinine, Ser: 1.86 mg/dL — ABNORMAL HIGH (ref 0.61–1.24)
GFR, Estimated: 37 mL/min — ABNORMAL LOW (ref >60)
Glucose, Bld: 103 mg/dL — ABNORMAL HIGH (ref 70–99)
Potassium: 4.2 mmol/L (ref 3.5–5.1)
Sodium: 136 mmol/L (ref 135–145)

## 2024-01-08 LAB — BRAIN NATRIURETIC PEPTIDE: B Natriuretic Peptide: 584.3 pg/mL — ABNORMAL HIGH (ref 0.0–100.0)

## 2024-01-12 ENCOUNTER — Ambulatory Visit (HOSPITAL_COMMUNITY): Payer: Self-pay | Admitting: Internal Medicine

## 2024-02-09 NOTE — Progress Notes (Signed)
 Rheumatology Follow Up Note  Chief Complaint  Patient presents with  . Rheumatoid factor positive      Subjective:HPI  [name] is a 78 y.o. male is here today for follow up of chronic urticaria, psoriasis, positive RF. The patient's allergies, current medications, past family history, past medical history, past social history, past surgical history and problem list were reviewed and updated as appropriate.   He was diagnosed with heart failure since last evaluation. He is well over all. He does get some lower back pains.   He is followed by Dr.Sharma. He is on mometasone for chronic urticaria. He has chronic psoriasis. This is stable. He denies any pain of the hand joints. He has no joint swelling. He is not taking cetirizine any more.    Review of Systems:   Review of Systems  Constitutional:  Negative for fatigue.  HENT:  Negative for mouth sores and trouble swallowing.        Neg: Dry Mouth  Eyes:  Negative for redness.       Neg: Dry Eyes  Respiratory:  Negative for cough and shortness of breath.   Cardiovascular:  Negative for chest pain and leg swelling.  Gastrointestinal:  Negative for constipation, diarrhea and nausea.  Endocrine: Negative for cold intolerance and heat intolerance.  Genitourinary:  Negative for hematuria.  Musculoskeletal:        Per HPI  Skin:  Positive for rash. Negative for color change.  Neurological:  Negative for dizziness, weakness, numbness and headaches.  Hematological:  Does not bruise/bleed easily.  Psychiatric/Behavioral:  Negative for dysphoric mood and sleep disturbance. The patient is not nervous/anxious.   All other systems reviewed and are negative.  Objective:  Vitals:   02/09/24 0806  BP: 112/68  Temp: 36.5 C (97.7 F)  TempSrc: Temporal  Weight: 78 kg (172 lb)  Height: 174 cm (5' 8.5)  PainSc:   5     Length of Stiffness: few minutes  GEN - Pleasant, No Apparent Distress  HEENT - normocephalic and atraumatic.  Conjunctiva Clear.   Neck - supple with no adenopathy or thyromegaly.   C spine with full range of motion. Heart - regular rate and rhythm, No murmurs/gallops/rub, Nml S1S2 Lungs - clear to auscultation in all fields. Extremities - there is no cyanosis; trace edema.  Neurological - alert and oriented.  Spine - no paraspinal tenderness; no L spine tenderness or SI joint tenderness Skin - Right elbow and left leg with psoriasis MSK - The following joints were examined bilaterally: Hands, Wrists, Elbows, Shoulders, Metatarsals, Ankles, Knees and Hips; they were normal apart from what is noted.   100% Fist Formation Right Knee Replacement  No Synovitis or Dactylitis No Tender Point  Labs/Imaging Reviewed in EMR  Cr 1.5, AST 19, ALT 14 Normal CBC, TSH Vitamin B12 > 1500  Profile DEC, 2020 C1 esterase functional is elevated  Normal complement C3-C4. Normal serum immunoglobulin IgA E G,M normal HCV antibody, normal CCP normal total CH 50 RF elevation 17.5, normal anti CCP, negative ANA.   Assessment and Plan   Diagnoses and all orders for this visit:  Rheumatoid factor positive  Psoriasis  Chronic urticaria -     mometasone (ELOCON) 0.1 % cream; Apply topically once daily as needed   -- Continue to see Dr.Sharma for urticaria. Since he has made dietary changes, this has improved significantly. -- The psoriasis is stable. The topical triamcinolone will help.  -- He also has positive RF without  active inflammation of the joints.  -- Continue to monitor for now   Return in about 1 year (around 02/08/2025) for Routine Follow Up.   All new prescription medications, changes in current prescription dosages, and sample medications were discussed with the patient, including patient education, medication name, use, dosage, potential side effects, drug interactions, consequences of not using/taking, and special instructions.  Patient expressed understanding.  No barriers to  adherence.   I appreciate the opportunity to participate in the care of [name]. Please do not hesitate to contact me with any questions or concerns that may arise in regards to the patient's rheumatologic disease.   I personally performed the service. (TP)  Alan LOREE BLANCH, MD

## 2024-02-17 ENCOUNTER — Other Ambulatory Visit
Admission: RE | Admit: 2024-02-17 | Discharge: 2024-02-17 | Disposition: A | Discharge status: Home / Self Care | Source: Ambulatory Visit | Attending: Cardiology | Admitting: Cardiology

## 2024-02-17 ENCOUNTER — Encounter: Payer: Self-pay | Admitting: Cardiology

## 2024-02-17 ENCOUNTER — Ambulatory Visit: Admitting: Cardiology

## 2024-02-17 VITALS — BP 111/65 | HR 57 | Wt 165.4 lb

## 2024-02-17 DIAGNOSIS — I480 Paroxysmal atrial fibrillation: Secondary | ICD-10-CM

## 2024-02-17 DIAGNOSIS — N183 Chronic kidney disease, stage 3 unspecified: Secondary | ICD-10-CM | POA: Diagnosis not present

## 2024-02-17 DIAGNOSIS — Z91018 Allergy to other foods: Secondary | ICD-10-CM | POA: Diagnosis not present

## 2024-02-17 DIAGNOSIS — I5022 Chronic systolic (congestive) heart failure: Secondary | ICD-10-CM

## 2024-02-17 LAB — BRAIN NATRIURETIC PEPTIDE: B Natriuretic Peptide: 484.8 pg/mL — ABNORMAL HIGH (ref 0.0–100.0)

## 2024-02-17 LAB — BASIC METABOLIC PANEL WITH GFR
Anion gap: 6 (ref 5–15)
BUN: 18 mg/dL (ref 8–23)
CO2: 27 mmol/L (ref 22–32)
Calcium: 8.6 mg/dL — ABNORMAL LOW (ref 8.9–10.3)
Chloride: 108 mmol/L (ref 98–111)
Creatinine, Ser: 1.66 mg/dL — ABNORMAL HIGH (ref 0.61–1.24)
GFR, Estimated: 42 mL/min — ABNORMAL LOW (ref >60)
Glucose, Bld: 102 mg/dL — ABNORMAL HIGH (ref 70–99)
Potassium: 4.3 mmol/L (ref 3.5–5.1)
Sodium: 141 mmol/L (ref 135–145)

## 2024-02-17 NOTE — Addendum Note (Signed)
 Addended by: SHARL GRATE A on: 02/17/2024 02:05 PM   Modules accepted: Orders

## 2024-02-17 NOTE — Progress Notes (Signed)
 [facility]  Referring Physician: Cleotilde Oneil FALCON, MD  Primary Care: Alan Oneil FALCON, MD Primary Cardiologist:  HPI: [name] is a 78 y.o. 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  [name] now has been followed by Dr. Pietro for several years now.  He was diagnosed with heart failure at [facility] in 2004.  At that time he had an EF of 25% and also had an endomyocardial biopsy that was negative.  It was felt to be secondary to ventricular ectopy and he was subsequently started on amiodarone .  By 2019-2021 he had improvement in his EF to 35 to 40% however echocardiograms dating back to 2022 with reduction in EF to 25%.  Despite this he remained fairly well compensated and active.  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  Around August 2024 he began to have atrial tachyarrhythmias (atrial fibrillation versus flutter) requiring cardioversion.  He had recurrent atrial flutter/fibrillation leading to admission for decompensated heart failure in October 2024 with LFTs reaching the thousands and AKI with serum creatinine of 4.5.  He was started on inotropes, diuresed and eventually weaned off inotropes and LVAD evaluation was started.    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  Workup at that time and workup several months later in September 2020 consistent with alpha gal with IgE titer greater than 100.   History - He has been doing very well from a functional standpoint. He monitors his weights daily. Log today demonstrates BP 107/58-124/67; his HR is generally low in the 60s. Weight has been stable in the 161-164 range.   Current Outpatient Medications  Medication Sig Dispense Refill   amiodarone  (PACERONE ) 200 MG tablet Take 1 tablet (200 mg total) by mouth  daily. 90 tablet 3   apixaban  (ELIQUIS ) 5 MG TABS tablet Take 1 tablet (5 mg total) by mouth 2 (two) times daily. 60 tablet 2   atorvastatin  (LIPITOR) 10 MG tablet Take 1 tablet (10 mg total) by mouth every other day. (Patient taking differently: Take 10 mg by mouth every other day. At night) 90 tablet 1   carvedilol  (COREG ) 3.125 MG tablet Take 1 tablet (3.125 mg total) by mouth 2 (two) times daily. 180 tablet 3   empagliflozin  (JARDIANCE ) 10 MG TABS tablet Take 1 tablet (10 mg total) by mouth daily. 30 tablet 2   EPINEPHrine  0.3 mg/0.3 mL IJ SOAJ injection Inject 0.3 mg into the muscle as needed for anaphylaxis.     famotidine  (PEPCID ) 20 MG tablet Take 20 mg by mouth daily.     feeding supplement (ENSURE ENLIVE / ENSURE PLUS) LIQD Take 237 mLs by mouth 2 (two) times daily between meals.     furosemide  (LASIX ) 20 MG tablet Take 1 tablet (20 mg total) by mouth daily as needed. 30 tablet 1   sacubitril -valsartan  (ENTRESTO ) 24-26 MG Take 1 tablet by mouth 2 (two) times daily. 60 tablet 11   spironolactone  (ALDACTONE ) 25 MG tablet Take 0.5 tablets (12.5 mg total) by mouth daily. 45 tablet 3   No current facility-administered medications for this visit.   PHYSICAL EXAM: Vitals:   02/17/24 1330  BP: 111/65  Pulse: (!) 57  SpO2: 100%   GENERAL: NAD Lungs- CTA CARDIAC:  JVP: 6 cm          Normal rate with regular  rhythm. no murmur.  Pulses 2+. no edema.  ABDOMEN: Soft, non-tender, non-distended.  EXTREMITIES: distal extremities are cool to touch but no signs of low output heart failure.  NEUROLOGIC: No obvious FND   Wt Readings from Last 3 Encounters:  02/17/24 165 lb 6.4 oz (75 kg)  12/22/23 173 lb (78.5 kg)  11/11/23 172 lb 12.8 oz (78.4 kg)    DATA REVIEW  ECG: NSR with 1AVB 68 bpm 12/22/23 (Personally reviewed)    ECHO: TEE (05/19/23): Severely reduced LVEF, mild AI.  05/18/23: LVEF <20%, mildly reduced RV function.  11/11/22: LVEF 25%-30%, normal RV function 04/26/23: LVEF  25%-30%, normal RV function 06/05/23: LVEF 35%-40%, normal RV function 10/12/23: LVEF 35%-40%, normal RV function.   RHC 11/24: RA 1, PA 32/13 (20), Fick CO/CI 4.6/2.6. TD CO/CI 5.7/3.2. PAPi >10.   ASSESSMENT & PLAN: Heart failure with reduced ejection fraction Etiology of HF: Nonischemic dilated cardiomyopathy.  As noted above diagnosed in 2004 with negative endomyocardial biopsy at [facility].  LGE not consistent with any type of infiltrative cardiomyopathy. NYHA class / AHA Stage: IIA;  Volume status & Diuretics: euvolemic, continue lasix  PRN only.  Vasodilators: He has a persistent cough that has not resolved now leading to dry heaves; it can happen once daily. At this time he would like to hold off on making any changes; continue Entresto  24/26mg  BID. Beta-Blocker:Now off digoxin . Continue coreg  3.125 mg BID  FMJ:Mzdujmu spiro 12.5mg  daily. Labs today. Repeat in ~10 days Cardiometabolic:Continue jardiance  10mg  daily Devices therapies & Valvulopathies:Primary prevention ICD; Dr. Danelle following Advanced therapies:Undergoing LVAD evaluation; current hurdles to LVAD implant are malnutrition/fraility & alpha gal. Have reached out to allergy/immunology, [facility], [facility] & other academic institutes. We will plan on either heparin desensitization or Xolair prior to implant. Also we will pre-medicate with H1/H2 blockers + IV steroids prior to use of heparin.  07/02/23: seen by allergy/immunology; if he were to proceed with LVAD we have a plan in place for heparin desensitization. 1.13.24: Doing remarkably well.  10/12/23: Continuing to make significant strides; LVEF now 35%.  5/25: Doing very well. No complaints.   2. 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  - 05/21/23: IgE total 1512, IgE pork 5.36, IgE Beef 30.50, alpha-gal >100 - discussed with multiple academic centers; see above.  - seen by Allergy/Immunology; will plan  on hep desensitization  if we proceed with VAD - At this time no plan to proceed with LVAD due to improvement in LVEF, high risk surgery in the setting of alpha gal.   3. Atrial fibrillation/flutter - DCCV during admission  - Diagnosed initially in July 2024 now s/p x2 DCCV  - EP following - Zio sent earlier last month 2/2 irregular heart rhythm on home monitor. Zio showed HR 53-113. Avg HR 66 bpm. 2 SVT run (longest 7 beats). <1% SVE - Continue apixaban  5mg  BID - Continue amiodarone  to 200mg  daily. TSH and LFTs stable 3/25  4. Reduced DLCO - Severely reduced DLCO by PFTs on 05/20/23; based on chart review he was fairly euvolemic on the day of his PFTs.  - CT chest from 05/19/23 without severe parenchymal lung disease - Continue to follow; doing very well symptomatically.   5. CKD  - sCr 1.86 on 01/08/24.   5. Malnutrition/cardiac cachexia - Resolved; doing very well.   6. Lower extremity cramping - Normal ABIs; followed by vascular surgery.  -  Likely 2/2 spinal radiculopathy.   7. Failure to thrive/anxiety - resolved; now off remeron .   I spent 35 minutes caring for this patient today including face to face time, ordering and reviewing labs, reviewing records noted above, seeing the patient, documenting in the record, and arranging follow ups.   Ria Commander, DO [facility]

## 2024-02-17 NOTE — Patient Instructions (Signed)
 Medication Changes:  No medication changes today!  Lab Work: Go over to the [facility]. Go pass the gift shop and have your blood work completed.  We will only call you if the results are abnormal or if the provider would like to make medication changes.   Testing/Procedures:  Your physician has requested that you have an echocardiogram. Echocardiography is a painless test that uses sound waves to create images of your heart. It provides your doctor with information about the size and shape of your heart and how well your heart's chambers and valves are working. This procedure takes approximately one hour. There are no restrictions for this procedure. Please do NOT wear cologne, perfume, aftershave, or lotions (deodorant is allowed). Please arrive 15 minutes prior to your appointment time.  Please note: We ask at that you not bring children with you during ultrasound (echo/ vascular) testing. Due to room size and safety concerns, children are not allowed in the ultrasound rooms during exams. Our front office staff cannot provide observation of children in our lobby area while testing is being conducted. An adult accompanying a patient to their appointment will only be allowed in the ultrasound room at the discretion of the ultrasound technician under special circumstances. We apologize for any inconvenience.   Follow-Up in: Please follow up with the [facility] in 4 months with Dr. Gardenia with an echo.  At the [facility], you and your health needs are our priority. We have a designated team specialized in the treatment of Heart Failure. This Care Team includes your primary Heart Failure Specialized Cardiologist (physician), Advanced Practice Providers (APPs- Physician Assistants and Nurse Practitioners), and Pharmacist who all work together to provide you with the care you need, when you need it.   You may see any of the following providers on your  designated Care Team at your next follow up:  Dr. Toribio Fuel Dr. Ezra Shuck Dr. Ria Gardenia Dr. Odis Brownie Ellouise Class, FNP Jaun Bash, RPH-CPP  Please be sure to bring in all your medications bottles to every appointment.   Need to Contact Us :  If you have any questions or concerns before your next appointment please send us  a message through [place] [facility] or call our office at [phone].    TO LEAVE A MESSAGE FOR THE NURSE SELECT OPTION 2, PLEASE LEAVE A MESSAGE INCLUDING: YOUR NAME DATE OF BIRTH CALL BACK NUMBER REASON FOR CALL**this is important as we prioritize the call backs  YOU WILL RECEIVE A CALL BACK THE SAME DAY AS LONG AS YOU CALL BEFORE 4:00 PM

## 2024-02-18 ENCOUNTER — Telehealth: Payer: Self-pay

## 2024-02-18 NOTE — Telephone Encounter (Signed)
 Called and informed the pt of the time, date, and location of echocardiogram. Pt agreeable. No further questions at this time.

## 2024-03-15 ENCOUNTER — Telehealth: Payer: Self-pay

## 2024-03-15 ENCOUNTER — Encounter: Payer: Self-pay | Admitting: Cardiology

## 2024-03-15 MED ORDER — LOSARTAN POTASSIUM 25 MG PO TABS: 12.5000 mg | ORAL_TABLET | Freq: Every day | ORAL | 1 refills | Status: DC

## 2024-03-15 NOTE — Telephone Encounter (Signed)
 Spoke to pt and he is agreeable to stopping entresto  and start losartan  12.5mg . Advised to call us  if bp issues do not resolve.

## 2024-03-18 ENCOUNTER — Encounter: Payer: Self-pay | Admitting: Cardiology

## 2024-03-21 NOTE — Progress Notes (Unsigned)
 HPI: FU nonischemic cardiomyopathy. Cardiac cath at [facility] in 2004 showed normal coronaries and EF 24; endocardial biopsy unrevealing. Note, his cardiomyopathy is felt possibly secondary to ventricular ectopy in the past and he is on amiodarone . Previous holter monitor in Dec 2012 showed frequent PVCs and nonsustained ventricular tachycardia. He also has had a previous ICD (generator removed in February of 2014 as his device had reached ERI and his LV function had improved). Abd ultrasound 1/15 showed no aneurysm. Nuclear study October 2022 showed ejection fraction 39%, prior inferior infarct but no ischemia.  Diagnosed with new onset atrial fibrillation July 2024.  He was started on anticoagulation and underwent elective cardioversion April 07, 2023 but did not hold sinus.  Patient admitted September 2024 with decompensated congestive heart failure and acute renal insufficiency requiring inotropes.  He was being assessed for LVAD placement.  Had TEE guided cardioversion of atrial flutter October 2024.  Right heart catheterization November 2024 showed PA pressure 32/13, pulmonary capillary wedge pressure 3 and mildly elevated PVR.  ABIs December 2024 showed normal toe brachial index on the right and left.  Echocardiogram February 2025 showed ejection fraction 30 to 35%, severe left ventricular enlargement, grade 1 diastolic dysfunction, mild to moderate mitral regurgitation, mild to moderate aortic insufficiency and mildly dilated aortic root at 43 mm.  Follow-up chest CT March 2025 showed stable 11 x 7 mm subpleural nodule at the right lung base with no follow-up recommended.  Monitor May 2025 showed sinus rhythm with PACs, short runs of SVT, occasional PVCs and rare couplet.  Since last seen, he has dyspnea with vigorous activities.  No orthopnea, PND, pedal edema, chest pain, palpitations or syncope.  No bleeding.  He does complain of a cough which is chronic.  He also has dizziness with  standing.  Current Outpatient Medications  Medication Sig Dispense Refill   amiodarone  (PACERONE ) 200 MG tablet Take 1 tablet (200 mg total) by mouth daily. 90 tablet 3   apixaban  (ELIQUIS ) 5 MG TABS tablet Take 1 tablet (5 mg total) by mouth 2 (two) times daily. 60 tablet 2   atorvastatin  (LIPITOR) 10 MG tablet Take 1 tablet (10 mg total) by mouth every other day. (Patient taking differently: Take 10 mg by mouth every other day. At night) 90 tablet 1   carvedilol  (COREG ) 3.125 MG tablet Take 1 tablet (3.125 mg total) by mouth 2 (two) times daily. 180 tablet 3   empagliflozin  (JARDIANCE ) 10 MG TABS tablet Take 1 tablet (10 mg total) by mouth daily. 30 tablet 2   EPINEPHrine  0.3 mg/0.3 mL IJ SOAJ injection Inject 0.3 mg into the muscle as needed for anaphylaxis.     famotidine  (PEPCID ) 20 MG tablet Take 20 mg by mouth daily.     feeding supplement (ENSURE ENLIVE / ENSURE PLUS) LIQD Take 237 mLs by mouth 2 (two) times daily between meals.     furosemide  (LASIX ) 20 MG tablet Take 1 tablet (20 mg total) by mouth daily as needed. 30 tablet 1   spironolactone  (ALDACTONE ) 25 MG tablet Take 0.5 tablets (12.5 mg total) by mouth daily. 45 tablet 3   No current facility-administered medications for this visit.     Past Medical History:  Diagnosis Date   Abnormal chest CT    Anaphylactic reaction    Anxiety    Asthma    Benign hypertension    Cardiomyopathy    Cardiomyopathy, idiopathic (HCC)    CHF (congestive heart failure) (HCC)  Chronic renal insufficiency    With creatinine 1.4   Complication of anesthesia    Drug-induced urticaria    FH: thoracic aortic aneurysm    GERD (gastroesophageal reflux disease)    History of colon polyps    Hives    Long Hx of hives   Hyperlipidemia    Hyperlipidemia    IMPLANTATION OF DEFIBRILLATOR, HX OF 02/15/2009   Qualifier: Diagnosis of  By: Pietro, MD, CODY Redell Dimes    NSVT (nonsustained ventricular tachycardia) (HCC)    Osteoarthritis     Osteoarthrosis, unspecified whether generalized or localized, lower leg    Osteoporosis    Oxygen  deficiency    Psoriasis    PVC (premature ventricular contraction)    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    Past Surgical History:  Procedure Laterality Date   APPENDECTOMY     BIV ICD GENERTAOR CHANGE OUT N/A 10/08/2012   Procedure: BIV ICD GENERTAOR CHANGE OUT;  Surgeon: Danelle LELON Birmingham, MD;  Location: [facility] CATH LAB;  Service: Cardiovascular;  Laterality: N/A;   CARDIAC CATHETERIZATION     CARDIAC DEFIBRILLATOR REMOVAL  2014   CARDIOVERSION N/A 04/07/2023   Procedure: CARDIOVERSION;  Surgeon: Raford Riggs, MD;  Location: [facility] INVASIVE CV LAB;  Service: Cardiovascular;  Laterality: N/A;   CATARACT EXTRACTION     CATARACT EXTRACTION W/PHACO Left 03/12/2016   Procedure: CATARACT EXTRACTION PHACO AND INTRAOCULAR LENS PLACEMENT (IOC);  Surgeon: Shivansh Dingeldein, MD;  Location: [facility] ORS;  Service: Ophthalmology;  Laterality: Left;  US  01:32 AP% 25.7 CDE 41.33 fluid pack lot # [id number]   COLONOSCOPY WITH PROPOFOL  N/A 04/26/2019   Procedure: COLONOSCOPY WITH PROPOFOL ;  Surgeon: Gaylyn Gladis PENNER, MD;  Location: [facility] ENDOSCOPY;  Service: Endoscopy;  Laterality: N/A;   ESOPHAGOGASTRODUODENOSCOPY     EYE SURGERY     Implantation of dual-chamber ICD.     St. Jude DDD-ICD 1/07   JOINT REPLACEMENT Right 08/16/2009   KNEE ARTHROPLASTY Left 2006   KNEE ARTHROPLASTY Right 12/10   x2   LAPAROSCOPIC APPENDECTOMY N/A 08/12/2018   Procedure: APPENDECTOMY LAPAROSCOPIC;  Surgeon: Desiderio Schanz, MD;  Location: [facility] ORS;  Service: General;  Laterality: N/A;   RIGHT HEART CATH N/A 07/07/2023   Procedure: RIGHT HEART CATH;  Surgeon: Gardenia Led, DO;  Location: [facility] INVASIVE CV LAB;  Service: Cardiovascular;  Laterality: N/A;   TONSILLECTOMY      Social History   Socioeconomic History   Marital status: Married    Spouse name: Not on file   Number of children: Not on file   Years of  education: Not on file   Highest education level: Not on file  Occupational History   Not on file  Tobacco Use   Smoking status: Former    Current packs/day: 0.00    Average packs/day: 1 pack/day for 10.0 years (10.0 ttl pk-yrs)    Types: Cigarettes    Start date: 12/30/1960    Quit date: 12/31/1970    Years since quitting: 53.2    Passive exposure: Past   Smokeless tobacco: Never  Vaping Use   Vaping status: Never Used  Substance and Sexual Activity   Alcohol use: Yes    Alcohol/week: 0.0 standard drinks of alcohol    Comment: 2 DRINKS PER DAY, none this week (7/26)   Drug use: Never   Sexual activity: Not Currently  Other Topics Concern   Not on file  Social History Narrative   Not on file   Social Drivers of  Health   Financial Resource Strain: Low Risk  (11/12/2023)   Received from [facility]   Overall Financial Resource Strain (CARDIA)    Difficulty of Paying Living Expenses: Not hard at all  Food Insecurity: No Food Insecurity (11/12/2023)   Received from [facility]   Hunger Vital Sign    Within the past 12 months, you worried that your food would run out before you got the money to buy more.: Never true    Within the past 12 months, the food you bought just didn't last and you didn't have money to get more.: Never true  Transportation Needs: No Transportation Needs (11/12/2023)   Received from [facility] - Transportation    In the past 12 months, has lack of transportation kept you from medical appointments or from getting medications?: No    Lack of Transportation (Non-Medical): No  Physical Activity: Unknown (08/12/2018)   Exercise Vital Sign    Days of Exercise per Week: Patient declined    Minutes of Exercise per Session: Patient declined  Stress: No Stress Concern Present (08/12/2018)   [employer] of Occupational Health - Occupational Stress Questionnaire    Feeling of Stress : Not at all   Social Connections: Unknown (08/12/2018)   Social Connection and Isolation Panel    Frequency of Communication with Friends and Family: Patient declined    Frequency of Social Gatherings with Friends and Family: Patient declined    Attends Religious Services: Patient declined    Database administrator or Organizations: Patient declined    Attends Banker Meetings: Patient declined    Marital Status: Patient declined  Intimate Partner Violence: Not At Risk (05/16/2023)   Humiliation, Afraid, Rape, and Kick questionnaire    Fear of Current or Ex-Partner: No    Emotionally Abused: No    Physically Abused: No    Sexually Abused: No    Family History  Problem Relation Age of Onset   Cancer Mother    Lung cancer Mother    Heart attack Father     ROS: no fevers or chills, productive cough, hemoptysis, dysphasia, odynophagia, melena, hematochezia, dysuria, hematuria, rash, seizure activity, orthopnea, PND, pedal edema, claudication. Remaining systems are negative.  Physical Exam: Well-developed well-nourished in no acute distress.  Skin is warm and dry.  HEENT is normal.  Neck is supple.  Chest is clear to auscultation with normal expansion.  Cardiovascular exam is regular rate and rhythm.  Abdominal exam nontender or distended. No masses palpated. Extremities show no edema. neuro grossly intact  A/P  1 chronic systolic congestive heart failure/nonischemic cardiomyopathy-patient remains euvolemic on examination.  Continue low-dose spironolactone  and Jardiance .  Patient did not tolerate Entresto  due to low blood pressure.  Losartan  was also recently discontinued secondary to dizziness.  He continues to have mild symptoms associated with Coreg  occasionally.  Will discontinue and try Toprol  12.5 mg nightly.  2 history of atrial fibrillation/flutter-patient remains in sinus rhythm today.  Continue amiodarone  and apixaban  at present dose.  Previously evaluated by Dr. Cindie  and felt not to be a candidate for ablation.  3 history of PVCs-continue amiodarone .  4 hyperlipidemia-continue statin.  5 thoracic aortic aneurysm-follow-up CT March 2025 showed no aneurysm.  6 history of lung nodule-no change on most recent CT and no follow-up recommended.  7 chronic stage III kidney disease  Redell Shallow, MD

## 2024-03-23 ENCOUNTER — Ambulatory Visit: Attending: Cardiology | Admitting: Cardiology

## 2024-03-23 ENCOUNTER — Encounter: Payer: Self-pay | Admitting: Cardiology

## 2024-03-23 VITALS — BP 118/60 | HR 68 | Ht 68.0 in | Wt 161.0 lb

## 2024-03-23 DIAGNOSIS — I428 Other cardiomyopathies: Secondary | ICD-10-CM

## 2024-03-23 DIAGNOSIS — I5022 Chronic systolic (congestive) heart failure: Secondary | ICD-10-CM

## 2024-03-23 DIAGNOSIS — I480 Paroxysmal atrial fibrillation: Secondary | ICD-10-CM | POA: Diagnosis not present

## 2024-03-23 MED ORDER — METOPROLOL SUCCINATE ER 25 MG PO TB24: 12.5000 mg | ORAL_TABLET | Freq: Every day | ORAL | 3 refills | Status: DC

## 2024-03-23 NOTE — Patient Instructions (Signed)
 Medication Instructions:   STOP CARVEDILOL   STAR METOPROLOL  SUCC ER 12.5 MG ONCE DAILY AT BEDTIME=1/2 OF 25 MG TABLET ONCE DAILY  *If you need a refill on your cardiac medications before your next appointment, please call your pharmacy*   Follow-Up: At [facility], you and your health needs are our priority.  As part of our continuing mission to provide you with exceptional heart care, our providers are all part of one team.  This team includes your primary Cardiologist (physician) and Advanced Practice Providers or APPs (Physician Assistants and Nurse Practitioners) who all work together to provide you with the care you need, when you need it.  Your next appointment:   6 month(s)  Provider:   Redell Shallow, MD

## 2024-04-09 ENCOUNTER — Encounter: Payer: Self-pay | Admitting: Cardiology

## 2024-04-20 ENCOUNTER — Ambulatory Visit: Admitting: Cardiology

## 2024-05-12 ENCOUNTER — Ambulatory Visit
Admission: EM | Admit: 2024-05-12 | Discharge: 2024-05-12 | Disposition: A | Discharge status: Home / Self Care | Attending: Emergency Medicine | Admitting: Emergency Medicine

## 2024-05-12 DIAGNOSIS — S51812A Laceration without foreign body of left forearm, initial encounter: Secondary | ICD-10-CM

## 2024-05-12 DIAGNOSIS — Z7901 Long term (current) use of anticoagulants: Secondary | ICD-10-CM | POA: Diagnosis not present

## 2024-05-12 MED ORDER — CEPHALEXIN 500 MG PO CAPS: 500.0000 mg | ORAL_CAPSULE | Freq: Three times a day (TID) | ORAL | 0 refills | Status: AC

## 2024-05-12 NOTE — ED Triage Notes (Signed)
 Patient to Urgent Care with complaints of a large skin tear to his left elbow. Symptoms started yesterday after a mechanical, ground level fall.   Reports he slipped and fell back on his driveway after he ran into his garage doors.  Band-Aids in place.

## 2024-05-12 NOTE — Discharge Instructions (Addendum)
 Take the cephalexin  as directed.  Keep your wounds clean and dry.  Wash them gently twice a day with soap and water.  Then apply an antibiotic ointment and nonstick dressing.  Follow-up with your primary care provider.

## 2024-05-12 NOTE — ED Provider Notes (Signed)
 [facility]    CSN: [id number] Arrival date & time: 05/12/24  9195      History   Chief Complaint Chief Complaint  Patient presents with   Fall   Wound Check    HPI [name] is a 78 y.o. male.  Patient presents with skin tears on his left forearm that occurred yesterday at around noon when he fell after running into his garage door.  No head injury or loss of consciousness.  He cleaned the wounds and applied a bandage.  He reports minimal bleeding.  No numbness or weakness.  No fever or purulent drainage.  Patient is on Eliquis .  Last tetanus 05/24/2020.  The history is provided by the patient and medical records.    Past Medical History:  Diagnosis Date   Abnormal chest CT    Anaphylactic reaction    Anxiety    Asthma    Benign hypertension    Cardiomyopathy    Cardiomyopathy, idiopathic (HCC)    CHF (congestive heart failure) (HCC)    Chronic renal insufficiency    With creatinine 1.4   Complication of anesthesia    Drug-induced urticaria    FH: thoracic aortic aneurysm    GERD (gastroesophageal reflux disease)    History of colon polyps    Hives    Long Hx of hives   Hyperlipidemia    Hyperlipidemia    IMPLANTATION OF DEFIBRILLATOR, HX OF 02/15/2009   Qualifier: Diagnosis of  By: Pietro, MD, CODY Redell Dimes    NSVT (nonsustained ventricular tachycardia) (HCC)    Osteoarthritis    Osteoarthrosis, unspecified whether generalized or localized, lower leg    Osteoporosis    Oxygen  deficiency    Psoriasis    PVC (premature ventricular contraction)    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    Patient Active Problem List   Diagnosis Date Noted   Pain in limb 07/21/2023   Protein-calorie malnutrition, severe 05/21/2023   Acute HFrEF (heart failure with reduced ejection fraction) (HCC) 05/15/2023   CHF exacerbation (HCC) 05/14/2023   Acute kidney injury superimposed on chronic kidney disease 05/14/2023   Transaminitis 05/14/2023    Elevated troponin 05/14/2023   Persistent atrial fibrillation (HCC) 04/02/2023   Hypercoagulable state due to persistent atrial fibrillation (HCC) 04/02/2023   History of colonic polyps 09/24/2018   Acute appendicitis 08/12/2018   Thoracic Facet hypertrophy (Bilateral) 01/20/2018   Thoracic facet syndrome (Bilateral) 01/20/2018   DDD (degenerative disc disease), thoracic 01/20/2018   Thoracic radiculitis (Left) 01/20/2018   Chronic upper back pain (Left) 01/20/2018   Aortic atherosclerosis 01/20/2018   Lumbar facet arthropathy 01/20/2018   Spondylosis without myelopathy or radiculopathy, thoracic region 01/20/2018   Spondylosis without myelopathy or radiculopathy, lumbosacral region 01/20/2018   History of rib fracture 01/20/2018   Diverticulosis, sigmoid 01/20/2018   Cholelithiasis 01/20/2018   Thoracic ascending aortic aneurysm (HCC) (4.2 cm) 01/20/2018   Hx of total knee replacement (Right) 01/20/2018   Chronic upper quadrant pain (LUQ) (Left) 01/20/2018   Neurogenic pain 01/20/2018   Chronic chest wall pain (Primary Area of Pain) (Left) 01/06/2018   Chronic thoracic back pain (Secondary Area of Pain) (Left) 01/06/2018   Positive Murphy's Sign 01/05/2018   Chronic pain syndrome 01/05/2018   Pharmacologic therapy 01/05/2018   Disorder of skeletal system 01/05/2018   Encounter for monitoring amiodarone  therapy 01/05/2018   B12 deficiency 10/06/2016   Medicare annual wellness visit, initial 10/06/2016   Abdominal pain (Left) 04/20/2015  CKD (chronic kidney disease) stage 3, GFR 30-59 ml/min (HCC) 09/18/2014   Asthma 01/05/2014   Bruit 08/29/2013   Automatic implantable cardioverter-defibrillator in situ 03/25/2012   Acute on chronic HFrEF (heart failure with reduced ejection fraction) (HCC) 09/09/2011   Benign essential hypertension 02/15/2009   Cardiomyopathy, nonischemic (HCC) 02/15/2009   LUNG NODULE 02/15/2009   DIZZINESS 02/15/2009   Nonspecific (abnormal) findings on  radiological and other examination of body structure 02/15/2009   Nonspecific abnormal findings on radiological and other examination of skull and head 02/15/2009   Combined hyperlipidemia 10/27/2008   Rheumatic fever 10/27/2008   Asymptomatic PVCs 10/27/2008   Other osteoporosis 10/27/2008    Past Surgical History:  Procedure Laterality Date   APPENDECTOMY     BIV ICD GENERTAOR CHANGE OUT N/A 10/08/2012   Procedure: BIV ICD GENERTAOR CHANGE OUT;  Surgeon: Danelle LELON Birmingham, MD;  Location: [facility] CATH LAB;  Service: Cardiovascular;  Laterality: N/A;   CARDIAC CATHETERIZATION     CARDIAC DEFIBRILLATOR REMOVAL  2014   CARDIOVERSION N/A 04/07/2023   Procedure: CARDIOVERSION;  Surgeon: Raford Riggs, MD;  Location: [facility] INVASIVE CV LAB;  Service: Cardiovascular;  Laterality: N/A;   CATARACT EXTRACTION     CATARACT EXTRACTION W/PHACO Left 03/12/2016   Procedure: CATARACT EXTRACTION PHACO AND INTRAOCULAR LENS PLACEMENT (IOC);  Surgeon: Kamron Dingeldein, MD;  Location: [facility] ORS;  Service: Ophthalmology;  Laterality: Left;  US  01:32 AP% 25.7 CDE 41.33 fluid pack lot # [id number]   COLONOSCOPY WITH PROPOFOL  N/A 04/26/2019   Procedure: COLONOSCOPY WITH PROPOFOL ;  Surgeon: Gaylyn Gladis PENNER, MD;  Location: [facility] ENDOSCOPY;  Service: Endoscopy;  Laterality: N/A;   ESOPHAGOGASTRODUODENOSCOPY     EYE SURGERY     Implantation of dual-chamber ICD.     St. Jude DDD-ICD 1/07   JOINT REPLACEMENT Right 08/16/2009   KNEE ARTHROPLASTY Left 2006   KNEE ARTHROPLASTY Right 12/10   x2   LAPAROSCOPIC APPENDECTOMY N/A 08/12/2018   Procedure: APPENDECTOMY LAPAROSCOPIC;  Surgeon: Desiderio Schanz, MD;  Location: [facility] ORS;  Service: General;  Laterality: N/A;   RIGHT HEART CATH N/A 07/07/2023   Procedure: RIGHT HEART CATH;  Surgeon: Gardenia Led, DO;  Location: [facility] INVASIVE CV LAB;  Service: Cardiovascular;  Laterality: N/A;   TONSILLECTOMY         Home Medications    Prior to Admission medications   Medication  Sig Start Date End Date Taking? Authorizing Provider  amiodarone  (PACERONE ) 200 MG tablet Take 1 tablet (200 mg total) by mouth daily. 10/12/23  Yes Sabharwal, Aditya, DO  apixaban  (ELIQUIS ) 5 MG TABS tablet Take 1 tablet (5 mg total) by mouth 2 (two) times daily. 05/24/23  Yes Henry Manuelita NOVAK, NP  atorvastatin  (LIPITOR) 10 MG tablet Take 1 tablet (10 mg total) by mouth every other day. Patient taking differently: Take 10 mg by mouth every other day. At night 07/02/23  Yes Sabharwal, Aditya, DO  cephALEXin  (KEFLEX ) 500 MG capsule Take 1 capsule (500 mg total) by mouth 3 (three) times daily for 5 days. 05/12/24 05/17/24 Yes Corlis Burnard DEL, NP  empagliflozin  (JARDIANCE ) 10 MG TABS tablet Take 1 tablet (10 mg total) by mouth daily. 05/25/23  Yes Henry Manuelita NOVAK, NP  famotidine  (PEPCID ) 20 MG tablet Take 20 mg by mouth daily. 11/02/18  Yes [provider]  metoprolol  succinate (TOPROL  XL) 25 MG 24 hr tablet Take 0.5 tablets (12.5 mg total) by mouth at bedtime. 03/23/24  Yes Pietro Redell RAMAN, MD  EPINEPHrine  0.3 mg/0.3 mL IJ SOAJ  injection Inject 0.3 mg into the muscle as needed for anaphylaxis.    [provider]  feeding supplement (ENSURE ENLIVE / ENSURE PLUS) LIQD Take 237 mLs by mouth 2 (two) times daily between meals. 05/16/23   Awanda [place], MD  furosemide  (LASIX ) 20 MG tablet Take 1 tablet (20 mg total) by mouth daily as needed. 05/24/23   Henry Manuelita NOVAK, NP  spironolactone  (ALDACTONE ) 25 MG tablet Take 0.5 tablets (12.5 mg total) by mouth daily. 12/22/23 03/23/24  Hayes Beckey CROME, NP    Family History Family History  Problem Relation Age of Onset   Cancer Mother    Lung cancer Mother    Heart attack Father     Social History Social History   Tobacco Use   Smoking status: Former    Current packs/day: 0.00    Average packs/day: 1 pack/day for 10.0 years (10.0 ttl pk-yrs)    Types: Cigarettes    Start date: 12/30/1960    Quit date: 12/31/1970    Years since quitting: 53.4     Passive exposure: Past   Smokeless tobacco: Never  Vaping Use   Vaping status: Never Used  Substance Use Topics   Alcohol use: Not Currently    Comment: 2 DRINKS PER DAY, none this week (7/26)   Drug use: Never     Allergies   Beef allergy, Meat [alpha-gal], Pork-derived products, Ace inhibitors, Aspirin , Bacid, Indocin [indomethacin], Lisinopril, Mobic [meloxicam], Naproxen, and Nsaids   Review of Systems Review of Systems  Constitutional:  Negative for chills and fever.  Musculoskeletal:  Negative for arthralgias and joint swelling.  Skin:  Positive for color change and wound.  Neurological:  Negative for weakness and numbness.     Physical Exam Triage Vital Signs ED Triage Vitals  Encounter Vitals Group     BP 05/12/24 0815 (!) 141/72     Girls Systolic BP Percentile --      Girls Diastolic BP Percentile --      Boys Systolic BP Percentile --      Boys Diastolic BP Percentile --      Pulse Rate 05/12/24 0815 64     Resp 05/12/24 0815 18     Temp 05/12/24 0815 97.9 F (36.6 C)     Temp src --      SpO2 05/12/24 0815 97 %     Weight --      Height --      Head Circumference --      Peak Flow --      Pain Score 05/12/24 0813 5     Pain Loc --      Pain Education --      Exclude from Growth Chart --    No data found.  Updated Vital Signs BP (!) 141/72   Pulse 64   Temp 97.9 F (36.6 C)   Resp 18   SpO2 97%   Visual Acuity Right Eye Distance:   Left Eye Distance:   Bilateral Distance:    Right Eye Near:   Left Eye Near:    Bilateral Near:     Physical Exam Constitutional:      General: He is not in acute distress. HENT:     Mouth/Throat:     Mouth: Mucous membranes are moist.  Cardiovascular:     Rate and Rhythm: Normal rate.  Pulmonary:     Effort: Pulmonary effort is normal. No respiratory distress.  Musculoskeletal:        General:  No deformity. Normal range of motion.  Skin:    General: Skin is warm and dry.     Findings: Bruising and  lesion present.     Comments: Skin tears on left arm.  See picture.  Neurological:     General: No focal deficit present.     Mental Status: He is alert.     Sensory: No sensory deficit.     Motor: No weakness.      UC Treatments / Results  Labs (all labs ordered are listed, but only abnormal results are displayed) Labs Reviewed - No data to display  EKG   Radiology No results found.  Procedures Procedures (including critical care time)  Medications Ordered in UC Medications - No data to display  Initial Impression / Assessment and Plan / UC Course  I have reviewed the triage vital signs and the nursing notes.  Pertinent labs & imaging results that were available during my care of the patient were reviewed by me and considered in my medical decision making (see chart for details).   Skin tears on left forearm, chronic anticoagulation.  Afebrile and vital signs are stable.  Tetanus is up-to-date.  Patient declines x-ray.  Wounds cleaned and dressed today.  Prophylactically treating with a short course of cephalexin  (renal dose).  Wound care instructions and signs of infection discussed.  Instructed patient to follow-up with his PCP.  Education provided on nonsutured laceration care.  He agrees to plan of care.  Final Clinical Impressions(s) / UC Diagnoses   Final diagnoses:  Skin tear of left forearm without complication, initial encounter  Chronic anticoagulation     Discharge Instructions      Take the cephalexin  as directed.  Keep your wounds clean and dry.  Wash them gently twice a day with soap and water.  Then apply an antibiotic ointment and nonstick dressing.  Follow-up with your primary care provider.     ED Prescriptions     Medication Sig Dispense Auth. Provider   cephALEXin  (KEFLEX ) 500 MG capsule Take 1 capsule (500 mg total) by mouth 3 (three) times daily for 5 days. 15 capsule Corlis Burnard DEL, NP      PDMP not reviewed this encounter.   Corlis Burnard DEL, NP 05/12/24 [phone]

## 2024-05-15 ENCOUNTER — Encounter: Payer: Self-pay | Admitting: Cardiology

## 2024-05-15 DIAGNOSIS — I10 Essential (primary) hypertension: Secondary | ICD-10-CM

## 2024-05-16 MED ORDER — LOSARTAN POTASSIUM 25 MG PO TABS: 25.0000 mg | ORAL_TABLET | Freq: Every day | ORAL | 3 refills | Status: AC

## 2024-05-18 ENCOUNTER — Ambulatory Visit
Admission: RE | Admit: 2024-05-18 | Discharge: 2024-05-18 | Disposition: A | Discharge status: Home / Self Care | Source: Ambulatory Visit | Attending: Cardiology | Admitting: Cardiology

## 2024-05-18 DIAGNOSIS — I5022 Chronic systolic (congestive) heart failure: Secondary | ICD-10-CM | POA: Insufficient documentation

## 2024-05-18 DIAGNOSIS — I351 Nonrheumatic aortic (valve) insufficiency: Secondary | ICD-10-CM | POA: Insufficient documentation

## 2024-05-18 LAB — ECHOCARDIOGRAM COMPLETE
AR max vel: 0.98 cm2
AV Area VTI: 1.24 cm2
AV Area mean vel: 0.96 cm2
AV Mean grad: 9 mmHg
AV Peak grad: 16.5 mmHg
Ao pk vel: 2.03 m/s
Area-P 1/2: 4.96 cm2
Calc EF: 31.4 %
P 1/2 time: 504 ms
S' Lateral: 5.3 cm
Single Plane A2C EF: 25.2 %
Single Plane A4C EF: 32.6 %

## 2024-05-18 NOTE — Progress Notes (Signed)
*  PRELIMINARY RESULTS* Echocardiogram 2D Echocardiogram has been performed.  [name] 05/18/2024, 10:46 AM

## 2024-05-23 LAB — BASIC METABOLIC PANEL WITH GFR
BUN/Creatinine Ratio: 18 (ref 10–24)
BUN: 31 mg/dL — ABNORMAL HIGH (ref 8–27)
CO2: 21 mmol/L (ref 20–29)
Calcium: 9 mg/dL (ref 8.6–10.2)
Chloride: 104 mmol/L (ref 96–106)
Creatinine, Ser: 1.69 mg/dL — ABNORMAL HIGH (ref 0.76–1.27)
Glucose: 88 mg/dL (ref 70–99)
Potassium: 4.5 mmol/L (ref 3.5–5.2)
Sodium: 139 mmol/L (ref 134–144)
eGFR: 42 mL/min/1.73 — ABNORMAL LOW (ref >59)

## 2024-05-23 NOTE — Progress Notes (Signed)
 Patient Profile:   [name]  is a 78 y.o.  male Chief Complaint  Patient presents with  . Follow-up      PROBLEM LIST: Past Medical History:  Diagnosis Date  . Asthma without status asthmaticus (HHS-HCC)   . CHF (congestive heart failure) (CMS/HHS-HCC)   . Chronic renal insufficiency   . Colon polyp   . History of cataract 2014   [facility]  . History of drug-induced urticaria    Cox-I inhibitors  . Hyperlipidemia   . Idiopathic cardiomyopathy (CMS/HHS-HCC)   . Nonsustained ventricular tachycardia (CMS/HHS-HCC)   . Psoriasis   . Thoracic ascending aortic aneurysm () 09/25/2018    Past Surgical History:  Procedure Laterality Date  . KNEE ARTHROSCOPY  1997   [facility] many times  . Left knee arthroscopy  04/23/05  . Defibrillator  01/07  . Implantation of defibrillator  01/07   St[employer] Jude implantable DDD-ICD defibrillator/pacer   . REPLACEMENT TOTAL KNEE Right 08/16/09  . Injection of left knee  08/16/09  . JOINT REPLACEMENT  2011   knee KC  . Defibrillator removal  2014  . COLONOSCOPY  09/19/2013   Adenomatous Polyps: CBF 09/2018: Recall ltr mailed   . EGD  09/19/13  . CATARACT EXTRACTION Right 01/16/2014  . CATARACT EXTRACTION Left 02/11/2016  . APPENDECTOMY  08/12/2018  . COLONOSCOPY  04/26/2019   Hyperplastic colon polyp/PHx CP/Repeat 64yrs/MUS  . Right knee arthroscopy     x 2  . TONSILLECTOMY      ALLERGIES: Allergies  Allergen Reactions  . Ace Inhibitors Other (See Comments)    Other Reaction: Other reaction/ Urticaria secondary to COX-1 inhibitors  . Alpha-Gal (Galactose-Alpha-1,3-Galactose) Anaphylaxis  . Beef Containing Products Anaphylaxis  . Pork/Porcine Containing Products Anaphylaxis  . Aspirin  Other (See Comments)    Other Reaction: Other reaction  . Indocin [Indomethacin] Unknown  . L.Acidoph-L.Bulg-B.Bif-S.Therm Unknown    Unknown reaction  . Lactobacillus Other (See Comments)  . Mobic  [Meloxicam] Unknown  . Nsaids (Non-Steroidal Anti-Inflammatory Drug) Other (See Comments)    Other Reaction: Other reaction    CURRENT MEDICATIONS: Current Outpatient Medications  Medication Sig Dispense Refill  . AMIOdarone  (PACERONE ) 200 MG tablet Take 200 mg by mouth 2 (two) times daily Once daily per patient.    . apixaban  (ELIQUIS ) 5 mg tablet Take 1 tablet (5 mg total) by mouth every 12 (twelve) hours    . atorvastatin  (LIPITOR) 20 MG tablet Take 1 tablet (20 mg total) by mouth once daily 90 tablet 3  . doxycycline  (VIBRAMYCIN ) 100 MG capsule Take 1 capsule (100 mg total) by mouth 2 (two) times daily with meals for 7 days 14 capsule 0  . empagliflozin  (JARDIANCE ) 10 mg tablet Take 10 mg by mouth every morning before breakfast    . EPIPEN  0.3 mg/0.3 mL auto-injector as directed Injection prn    . famotidine  (PEPCID ) 40 MG tablet Take 40 mg by mouth 2 (two) times daily    . losartan  (COZAAR ) 25 MG tablet Take 25 mg by mouth once daily    . mometasone (ELOCON) 0.1 % cream Apply topically once daily as needed 45 g 11  . predniSONE  (DELTASONE ) 20 MG tablet Take 1 tablet (20 mg total) by mouth once daily as needed for up to 30 days 30 tablet 0  . spironolactone  (ALDACTONE ) 25 MG tablet  Take 12.5 mg by mouth once daily     No current facility-administered medications for this visit.      HPI   CLINICAL SUMMARY:  Patient presents for left arm cellulitis.  Received IM Rocephin and placed on doxycycline , left arm doing dramatically better.  Maintain Vaseline on the eschar of his left arm.  Alpha gal has been reasonably stable.  Patient drinking more fluids  ROS: Review of systems is unremarkable for any active cardiac, respiratory, GI, GU, hematologic, neurologic, dermatologic, HEENT, or psychiatric symptoms except as noted above, 10 systems reviewed.  No fevers, chills, or constitutional symptoms.   PHYSICAL EXAM  Vital signs:  BP 112/70   Pulse 63   Wt 72.6 kg (160 lb)   SpO2 95%    BMI 23.97 kg/m  Body mass index is 23.97 kg/m.   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

## 2024-05-24 ENCOUNTER — Ambulatory Visit (HOSPITAL_COMMUNITY): Payer: Self-pay | Admitting: Cardiology

## 2024-05-24 ENCOUNTER — Ambulatory Visit: Payer: Self-pay | Admitting: Cardiology

## 2024-05-30 ENCOUNTER — Other Ambulatory Visit: Payer: Self-pay | Admitting: Cardiology

## 2024-06-06 ENCOUNTER — Other Ambulatory Visit: Payer: Self-pay

## 2024-06-06 MED ORDER — APIXABAN 5 MG PO TABS: 5.0000 mg | ORAL_TABLET | Freq: Two times a day (BID) | ORAL | 5 refills | Status: AC

## 2024-06-06 NOTE — Telephone Encounter (Signed)
 Prescription refill request for Eliquis  received. Indication:afib Last office visit:8/25 Scr:1.7  9/25 Age: 78 Weight:73  kg  Prescription refilled

## 2024-06-27 NOTE — Progress Notes (Unsigned)
  Electrophysiology Office Follow up Visit Note:    Date:  06/29/2024   ID:  [name], DOB 06-22-1946, MRN [id number]  PCP:  Cleotilde Oneil FALCON, MD  [facility] Cardiologist:  Redell Shallow, MD  [facility] Electrophysiologist:  OLE ONEIDA HOLTS, MD    Interval History:     [name] is a 78 y.o. male who presents for a follow up visit.   I last saw the patient July 01, 2023.  He has a history of chronic systolic heart failure and atrial fibrillation.  He is on amiodarone  for rhythm control.  At the time of the last appointment he was undergoing workup for possible heart transplantation.  He saw Dr. Shallow on March 24, 2023.  He previously had a dual-chamber, dual coil ICD.  The generator of this device was removed by Dr. Waddell.  Dr. Waddell recommended no reimplant and the patient agrees with plan for no reimplant.  He checks his blood pressures and heart rhythm at home using a Kardia mobile and blood pressure cuff.  [facility] has recently shown atrial fibrillation.  He is completely asymptomatic.      Past medical, surgical, social and family history were reviewed.  ROS:   Please see the history of present illness.    All other systems reviewed and are negative.  EKGs/Labs/Other Studies Reviewed:    The following studies were reviewed today:  May 18, 2024 echo EF 30-35 RV normal         Physical Exam:    VS:  BP (!) 110/50 (BP Location: Left Arm, Patient Position: Sitting, Cuff Size: Normal) Comment: just took meds about a hour ago  Pulse (!) 53   Ht 5' 8 (1.727 m)   Wt 160 lb 6.4 oz (72.8 kg)   SpO2 99%   BMI 24.39 kg/m     Wt Readings from Last 3 Encounters:  06/29/24 160 lb 6.4 oz (72.8 kg)  03/23/24 161 lb (73 kg)  02/17/24 165 lb 6.4 oz (75 kg)     GEN: no distress CARD: Irregular, No MRG.   RESP: No IWOB. CTAB.      ASSESSMENT:    1. Chronic systolic heart failure (HCC)   2. Encounter for long-term  (current) use of high-risk medication   3. Persistent atrial fibrillation (HCC)    PLAN:    In order of problems listed above:  #Persistent atrial fibrillation #High risk med monitoring-amiodarone  The patient is maintaining normal rhythm.  I suspect he has had false positive readings on his [facility] because of PACs. He is a poor candidate for invasive EP procedures such as catheter ablation for his atrial arrhythmias.  recommend continuing amiodarone .  Continue Eliquis   Update CMP, TSH and free T4*  #Chronic systolic heart failure Follows with heart failure clinic NYHA class III.  Warm and dry on exam today. Continue spironolactone , metoprolol , losartan , Lasix , Jardiance    I discussed my upcoming departure from [facility].  He will continue to follow-up with one of our partners moving forward.  Follow-up 6 months with EP APP.    Signed, Ole Holts, MD, [facility], [facility] 06/29/2024 8:38 AM    Electrophysiology [facility]

## 2024-06-29 ENCOUNTER — Other Ambulatory Visit: Payer: Self-pay

## 2024-06-29 ENCOUNTER — Other Ambulatory Visit
Admission: RE | Admit: 2024-06-29 | Discharge: 2024-06-29 | Disposition: A | Discharge status: Home / Self Care | Source: Ambulatory Visit | Attending: Cardiology | Admitting: Cardiology

## 2024-06-29 ENCOUNTER — Ambulatory Visit: Attending: Cardiology | Admitting: Cardiology

## 2024-06-29 ENCOUNTER — Encounter: Payer: Self-pay | Admitting: Cardiology

## 2024-06-29 VITALS — BP 110/50 | HR 53 | Ht 68.0 in | Wt 160.4 lb

## 2024-06-29 DIAGNOSIS — I4819 Other persistent atrial fibrillation: Secondary | ICD-10-CM | POA: Insufficient documentation

## 2024-06-29 DIAGNOSIS — Z95810 Presence of automatic (implantable) cardiac defibrillator: Secondary | ICD-10-CM

## 2024-06-29 DIAGNOSIS — Z79899 Other long term (current) drug therapy: Secondary | ICD-10-CM | POA: Insufficient documentation

## 2024-06-29 DIAGNOSIS — I5022 Chronic systolic (congestive) heart failure: Secondary | ICD-10-CM

## 2024-06-29 LAB — T4, FREE: Free T4: 1.35 ng/dL — ABNORMAL HIGH (ref 0.61–1.12)

## 2024-06-29 LAB — COMPREHENSIVE METABOLIC PANEL WITH GFR
ALT: 56 U/L — ABNORMAL HIGH (ref 0–44)
AST: 47 U/L — ABNORMAL HIGH (ref 15–41)
Albumin: 3.9 g/dL (ref 3.5–5.0)
Alkaline Phosphatase: 73 U/L (ref 38–126)
Anion gap: 10 (ref 5–15)
BUN: 25 mg/dL — ABNORMAL HIGH (ref 8–23)
CO2: 26 mmol/L (ref 22–32)
Calcium: 9.1 mg/dL (ref 8.9–10.3)
Chloride: 106 mmol/L (ref 98–111)
Creatinine, Ser: 1.44 mg/dL — ABNORMAL HIGH (ref 0.61–1.24)
GFR, Estimated: 50 mL/min — ABNORMAL LOW (ref >60)
Glucose, Bld: 92 mg/dL (ref 70–99)
Potassium: 4.3 mmol/L (ref 3.5–5.1)
Sodium: 142 mmol/L (ref 135–145)
Total Bilirubin: 0.7 mg/dL (ref 0.0–1.2)
Total Protein: 6.4 g/dL — ABNORMAL LOW (ref 6.5–8.1)

## 2024-06-29 LAB — TSH: TSH: 3.67 u[IU]/mL (ref 0.350–4.500)

## 2024-06-29 NOTE — Addendum Note (Signed)
 Addended by: CHAUVIGNE, Arushi Partridge on: 06/29/2024 08:45 AM   Modules accepted: Orders

## 2024-06-29 NOTE — Patient Instructions (Signed)
 Medication Instructions:  Your physician recommends that you continue on your current medications as directed. Please refer to the Current Medication list given to you today.  *If you need a refill on your cardiac medications before your next appointment, please call your pharmacy*  Lab Work: TODAY: CMET, TSH, T4  Follow-Up: At [facility], you and your health needs are our priority.  As part of our continuing mission to provide you with exceptional heart care, our providers are all part of one team.  This team includes your primary Cardiologist (physician) and Advanced Practice Providers or APPs (Physician Assistants and Nurse Practitioners) who all work together to provide you with the care you need, when you need it.  Your next appointment:   6 months  Provider:   Suzann Riddle, NP

## 2024-07-01 ENCOUNTER — Encounter: Payer: Self-pay | Admitting: Cardiology

## 2024-07-11 ENCOUNTER — Telehealth: Payer: Self-pay | Admitting: Family

## 2024-07-11 NOTE — Progress Notes (Unsigned)
 [facility] NOTE  Referring Physician: Cleotilde Oneil FALCON, MD  Primary Care: Alan Oneil FALCON, MD Primary Cardiologist:  HPI: [name] is a 78 y.o. 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  [name] now has been followed by Dr. Pietro for several years now.  He was diagnosed with heart failure at [facility] in 2004.  At that time he had an EF of 25% and also had an endomyocardial biopsy that was negative.  It was felt to be secondary to ventricular ectopy and he was subsequently started on amiodarone .  By 2019-2021 he had improvement in his EF to 35 to 40% however echocardiograms dating back to 2022 with reduction in EF to 25%.  Despite this he remained fairly well compensated and active.  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  Around August 2024 he began to have atrial tachyarrhythmias (atrial fibrillation versus flutter) requiring cardioversion.  He had recurrent atrial flutter/fibrillation leading to admission for decompensated heart failure in October 2024 with LFTs reaching the thousands and AKI with serum creatinine of 4.5.  He was started on inotropes, diuresed and eventually weaned off inotropes and LVAD evaluation was started.    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  Workup at that time and workup several months later in September 2020 consistent with alpha gal with IgE titer greater than 100.   History - He has been doing very well from a functional standpoint. He monitors his weights daily. Log today demonstrates BP 107/58-124/67; his HR is generally low in the 60s. Weight has been stable in the 161-164 range.   Current Outpatient Medications  Medication Sig Dispense Refill   amiodarone  (PACERONE ) 200 MG tablet Take 1 tablet (200 mg total) by mouth  daily. 90 tablet 3   apixaban  (ELIQUIS ) 5 MG TABS tablet Take 1 tablet (5 mg total) by mouth 2 (two) times daily. 60 tablet 5   atorvastatin  (LIPITOR) 10 MG tablet Take 1 tablet (10 mg total) by mouth every other day. (Patient taking differently: Take 10 mg by mouth every other day. At night) 90 tablet 1   empagliflozin  (JARDIANCE ) 10 MG TABS tablet TAKE 1 TABLET BY MOUTH EVERY DAY 90 tablet 3   EPINEPHrine  0.3 mg/0.3 mL IJ SOAJ injection Inject 0.3 mg into the muscle as needed for anaphylaxis.     famotidine  (PEPCID ) 20 MG tablet Take 20 mg by mouth daily.     feeding supplement (ENSURE ENLIVE / ENSURE PLUS) LIQD Take 237 mLs by mouth 2 (two) times daily between meals.     furosemide  (LASIX ) 20 MG tablet Take 1 tablet (20 mg total) by mouth daily as needed. 30 tablet 1   losartan  (COZAAR ) 25 MG tablet Take 1 tablet (25 mg total) by mouth daily. 90 tablet 3   metoprolol  succinate (TOPROL  XL) 25 MG 24 hr tablet Take 0.5 tablets (12.5 mg total) by mouth at bedtime. 45 tablet 3   spironolactone  (ALDACTONE ) 25 MG tablet Take 0.5 tablets (12.5 mg total) by mouth daily. 45 tablet 3   No current facility-administered medications for this visit.   PHYSICAL EXAM: There were no vitals filed for this visit.  GENERAL: NAD Lungs- CTA CARDIAC:  JVP: 6 cm          Normal rate with regular rhythm. no murmur.  Pulses 2+. no  edema.  ABDOMEN: Soft, non-tender, non-distended.  EXTREMITIES: distal extremities are cool to touch but no signs of low output heart failure.  NEUROLOGIC: No obvious FND   Wt Readings from Last 3 Encounters:  06/29/24 160 lb 6.4 oz (72.8 kg)  03/23/24 161 lb (73 kg)  02/17/24 165 lb 6.4 oz (75 kg)    DATA REVIEW  ECG: NSR with 1AVB 68 bpm 12/22/23 (Personally reviewed)    ECHO: TEE (05/19/23): Severely reduced LVEF, mild AI.  05/18/23: LVEF <20%, mildly reduced RV function.  11/11/22: LVEF 25%-30%, normal RV function 04/26/23: LVEF 25%-30%, normal RV function 06/05/23: LVEF  35%-40%, normal RV function 10/12/23: LVEF 35%-40%, normal RV function.   RHC 11/24: RA 1, PA 32/13 (20), Fick CO/CI 4.6/2.6. TD CO/CI 5.7/3.2. PAPi >10.   ASSESSMENT & PLAN: Heart failure with reduced ejection fraction Etiology of HF: Nonischemic dilated cardiomyopathy.  As noted above diagnosed in 2004 with negative endomyocardial biopsy at [facility].  LGE not consistent with any type of infiltrative cardiomyopathy. NYHA class / AHA Stage: IIA;  Volume status & Diuretics: euvolemic, continue lasix  PRN only.  Vasodilators: He has a persistent cough that has not resolved now leading to dry heaves; it can happen once daily. At this time he would like to hold off on making any changes; continue Entresto  24/26mg  BID. Beta-Blocker:Now off digoxin . Continue coreg  3.125 mg BID  FMJ:Mzdujmu spiro 12.5mg  daily. Labs today. Repeat in ~10 days Cardiometabolic:Continue jardiance  10mg  daily Devices therapies & Valvulopathies:Primary prevention ICD; Dr. Danelle following Advanced therapies:Undergoing LVAD evaluation; current hurdles to LVAD implant are malnutrition/fraility & alpha gal. Have reached out to allergy/immunology, [employer], [facility] & other academic institutes. We will plan on either heparin desensitization or Xolair prior to implant. Also we will pre-medicate with H1/H2 blockers + IV steroids prior to use of heparin.  07/02/23: seen by allergy/immunology; if he were to proceed with LVAD we have a plan in place for heparin desensitization. 1.13.24: Doing remarkably well.  10/12/23: Continuing to make significant strides; LVEF now 35%.  5/25: Doing very well. No complaints.   2. 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  - 05/21/23: IgE total 1512, IgE pork 5.36, IgE Beef 30.50, alpha-gal >100 - discussed with multiple academic centers; see above.  - seen by Allergy/Immunology; will plan on hep desensitization  if we proceed with  VAD - At this time no plan to proceed with LVAD due to improvement in LVEF, high risk surgery in the setting of alpha gal.   3. Atrial fibrillation/flutter - DCCV during admission  - Diagnosed initially in July 2024 now s/p x2 DCCV  - EP following - Zio sent earlier last month 2/2 irregular heart rhythm on home monitor. Zio showed HR 53-113. Avg HR 66 bpm. 2 SVT run (longest 7 beats). <1% SVE - Continue apixaban  5mg  BID - Continue amiodarone  to 200mg  daily. TSH and LFTs stable 3/25  4. Reduced DLCO - Severely reduced DLCO by PFTs on 05/20/23; based on chart review he was fairly euvolemic on the day of his PFTs.  - CT chest from 05/19/23 without severe parenchymal lung disease - Continue to follow; doing very well symptomatically.   5. CKD  - sCr 1.86 on 01/08/24.   5. Malnutrition/cardiac cachexia - Resolved; doing very well.   6. Lower extremity cramping - Normal ABIs; followed by vascular surgery.  - Likely 2/2 spinal radiculopathy.   7.  Failure to thrive/anxiety - resolved; now off remeron .   I spent 35 minutes caring for this patient today including face to face time, ordering and reviewing labs, reviewing records noted above, seeing the patient, documenting in the record, and arranging follow ups.   Ellouise DELENA Class, DO [facility]

## 2024-07-11 NOTE — Telephone Encounter (Signed)
 Called to confirm/remind patient of their appointment at the [facility] on 07/12/24.   Appointment:   [x] Confirmed  [] Left mess   [] No answer/No voice mail  [] VM Full/unable to leave message  [] Phone not in service  Patient reminded to bring all medications and/or complete list.  Confirmed patient has transportation. Gave directions, instructed to utilize valet parking.

## 2024-07-12 ENCOUNTER — Ambulatory Visit: Attending: Family | Admitting: Family

## 2024-07-12 ENCOUNTER — Encounter: Payer: Self-pay | Admitting: Family

## 2024-07-12 VITALS — BP 115/68 | HR 64 | Wt 159.0 lb

## 2024-07-12 DIAGNOSIS — N189 Chronic kidney disease, unspecified: Secondary | ICD-10-CM | POA: Insufficient documentation

## 2024-07-12 DIAGNOSIS — I130 Hypertensive heart and chronic kidney disease with heart failure and stage 1 through stage 4 chronic kidney disease, or unspecified chronic kidney disease: Secondary | ICD-10-CM | POA: Insufficient documentation

## 2024-07-12 DIAGNOSIS — Z87892 Personal history of anaphylaxis: Secondary | ICD-10-CM | POA: Insufficient documentation

## 2024-07-12 DIAGNOSIS — Z7901 Long term (current) use of anticoagulants: Secondary | ICD-10-CM | POA: Diagnosis not present

## 2024-07-12 DIAGNOSIS — J45909 Unspecified asthma, uncomplicated: Secondary | ICD-10-CM

## 2024-07-12 DIAGNOSIS — I5022 Chronic systolic (congestive) heart failure: Secondary | ICD-10-CM | POA: Insufficient documentation

## 2024-07-12 DIAGNOSIS — Z7984 Long term (current) use of oral hypoglycemic drugs: Secondary | ICD-10-CM | POA: Diagnosis not present

## 2024-07-12 DIAGNOSIS — I428 Other cardiomyopathies: Secondary | ICD-10-CM | POA: Insufficient documentation

## 2024-07-12 DIAGNOSIS — I480 Paroxysmal atrial fibrillation: Secondary | ICD-10-CM | POA: Insufficient documentation

## 2024-07-12 DIAGNOSIS — N1831 Chronic kidney disease, stage 3a: Secondary | ICD-10-CM

## 2024-07-12 DIAGNOSIS — Z79899 Other long term (current) drug therapy: Secondary | ICD-10-CM | POA: Insufficient documentation

## 2024-07-12 DIAGNOSIS — Z91018 Allergy to other foods: Secondary | ICD-10-CM

## 2024-07-12 MED ORDER — METOPROLOL SUCCINATE ER 25 MG PO TB24: 12.5000 mg | ORAL_TABLET | Freq: Every day | ORAL | 3 refills | Status: AC

## 2024-08-04 ENCOUNTER — Encounter: Payer: Self-pay | Admitting: Cardiology

## 2024-08-26 ENCOUNTER — Encounter: Payer: Self-pay | Admitting: Cardiology

## 2024-08-29 ENCOUNTER — Encounter: Payer: Self-pay | Admitting: Cardiology

## 2024-08-30 NOTE — Telephone Encounter (Signed)
 Adding on response from other encounter:  ODDIS WESTLING to [facility] Triage (supporting Redell GORMAN Shallow, MD)    08/29/24  4:11 PM I am drinking fuilds, I have a couple cups of coffee per day, so nothing has changed.   Yesterday it was really low.

## 2024-10-18 ENCOUNTER — Ambulatory Visit: Admitting: Cardiology

## 2024-11-09 ENCOUNTER — Ambulatory Visit: Admitting: Family
